# Patient Record
Sex: Male | Born: 1965 | Race: White | Hispanic: Yes | Marital: Married | Smoking: Former smoker
Health system: Southern US, Community
[De-identification: ages and names within clinical notes are randomized; demographics above are authoritative.]

## PROBLEM LIST (undated history)

## (undated) DIAGNOSIS — I639 Cerebral infarction, unspecified: Secondary | ICD-10-CM

## (undated) DIAGNOSIS — C4490 Unspecified malignant neoplasm of skin, unspecified: Secondary | ICD-10-CM

## (undated) DIAGNOSIS — I10 Essential (primary) hypertension: Secondary | ICD-10-CM

## (undated) HISTORY — PX: SKIN SURGERY: SHX2413

---

## 2004-03-11 ENCOUNTER — Ambulatory Visit: Payer: Self-pay | Admitting: Infectious Diseases

## 2004-03-11 ENCOUNTER — Inpatient Hospital Stay (HOSPITAL_COMMUNITY): Admission: EM | Admit: 2004-03-11 | Discharge: 2004-03-13 | Payer: Self-pay | Admitting: Emergency Medicine

## 2004-03-16 ENCOUNTER — Emergency Department (HOSPITAL_COMMUNITY): Admission: EM | Admit: 2004-03-16 | Discharge: 2004-03-16 | Payer: Self-pay | Admitting: Emergency Medicine

## 2006-02-08 ENCOUNTER — Ambulatory Visit (HOSPITAL_COMMUNITY): Admission: RE | Admit: 2006-02-08 | Discharge: 2006-02-08 | Payer: Self-pay | Admitting: Chiropractic Medicine

## 2013-03-17 ENCOUNTER — Encounter (HOSPITAL_COMMUNITY): Payer: Self-pay | Admitting: Emergency Medicine

## 2013-03-17 ENCOUNTER — Emergency Department (HOSPITAL_COMMUNITY)
Admission: EM | Admit: 2013-03-17 | Discharge: 2013-03-17 | Disposition: A | Discharge status: Home / Self Care | Payer: Self-pay | Attending: Emergency Medicine | Admitting: Emergency Medicine

## 2013-03-17 DIAGNOSIS — K089 Disorder of teeth and supporting structures, unspecified: Secondary | ICD-10-CM | POA: Insufficient documentation

## 2013-03-17 DIAGNOSIS — F17200 Nicotine dependence, unspecified, uncomplicated: Secondary | ICD-10-CM | POA: Insufficient documentation

## 2013-03-17 DIAGNOSIS — K029 Dental caries, unspecified: Secondary | ICD-10-CM | POA: Insufficient documentation

## 2013-03-17 DIAGNOSIS — K0889 Other specified disorders of teeth and supporting structures: Secondary | ICD-10-CM

## 2013-03-17 DIAGNOSIS — K006 Disturbances in tooth eruption: Secondary | ICD-10-CM | POA: Insufficient documentation

## 2013-03-17 MED ORDER — AMOXICILLIN 500 MG PO CAPS: 500.0000 mg | ORAL_CAPSULE | Freq: Three times a day (TID) | ORAL | Status: DC

## 2013-03-17 MED ORDER — OXYCODONE-ACETAMINOPHEN 5-325 MG PO TABS: 1.0000 | ORAL_TABLET | ORAL | Status: DC | PRN

## 2013-03-17 NOTE — ED Provider Notes (Signed)
CSN: [id number]     Arrival date & time 03/17/13  2141 History  This chart was scribed for Margarita Mail, PA-C, working with Elmer Sow, MD by Maree Erie, ED Scribe. This patient was seen in room TR07C/TR07C and the patient's care was started at 11:12 PM.     Chief Complaint  Patient presents with  . Dental Pain    Patient is a 48 y.o. male presenting with tooth pain. The history is provided by the patient. No language interpreter was used.  Dental Pain   HPI Comments: James Cortez is a 48 y.o. male who presents to the Emergency Department complaining of constant left upper dental pain that began four days ago. He reports associated gum swelling. The pain is worsened by cold air. The pain is moderately alleviated by Advil. He denies fever. He is a current smoker.   History reviewed. No pertinent past medical history. History reviewed. No pertinent past surgical history. History reviewed. No pertinent family history. History  Substance Use Topics  . Smoking status: Current Every Day Smoker  . Smokeless tobacco: Not on file  . Alcohol Use: Yes     Comment: rare    Review of Systems  Allergies  Review of patient's allergies indicates no known allergies.  Home Medications   Current Outpatient Rx  Name  Route  Sig  Dispense  Refill  . ibuprofen (ADVIL,MOTRIN) 200 MG tablet   Oral   Take 200 mg by mouth every 6 (six) hours as needed for mild pain.          Triage Vitals: BP 138/80  Pulse 64  Temp(Src) 97.5 F (36.4 C) (Oral)  Resp 12  Ht 5\' 7"  (1.702 m)  Wt 166 lb (75.297 kg)  BMI 25.99 kg/m2  SpO2 95%  Physical Exam  Nursing note and vitals reviewed. Constitutional: He is oriented to person, place, and time. He appears well-developed and well-nourished. No distress.  HENT:  Head: Normocephalic and atraumatic.  Mouth/Throat: Abnormal dentition. Dental caries present.  Multiple caries. Gingival erythema without discharge or fluctuance.   Eyes: EOM are normal.   Neck: Neck supple. No tracheal deviation present.  Cardiovascular: Normal rate.   Pulmonary/Chest: Effort normal. No respiratory distress.  Musculoskeletal: Normal range of motion.  Neurological: He is alert and oriented to person, place, and time.  Skin: Skin is warm and dry.  Psychiatric: He has a normal mood and affect. His behavior is normal.    ED Course  Procedures (including critical care time)  DIAGNOSTIC STUDIES: Oxygen Saturation is 95% on room air, adequate by my interpretation.    COORDINATION OF CARE: 11:15 PM -Will discharge with prescription for pain medication and antibiotic. Patient verbalizes understanding and agrees with treatment plan.    Labs Review Labs Reviewed - No data to display Imaging Review No results found.  EKG Interpretation   None       MDM   1. Pain, dental    Patient with toothache.  No gross abscess.  Exam unconcerning for Ludwig's angina or spread of infection.  Will treat with penicillin and pain medicine.  Urged patient to follow-up with dentist.     {I personally performed the services described in this documentation, which was scribed in my presence. The recorded information has been reviewed and is accurate.     Margarita Mail, PA-C 03/18/13 1354

## 2013-03-17 NOTE — ED Notes (Signed)
C/o L upper molar pain, onset 4d ago, h/o same 3 weeks ago, took advil with some relief 2 hrs ago. Mentions swelling. (Denies: nv, dizziness, fever, drainage or other sx).

## 2013-03-17 NOTE — Discharge Instructions (Signed)
You have been diagnosed with Dental pain. Please call the follow up dentist first thing in the morning on Monday for a follow up appointment. Keep your discharge paperwork from today's visit to bring to the dentist office. You may also use the resource guide listed below to help you find a dentist if you do not already have one to followup with. It is very important that you get evaluated by a dentist as soon as possible.  Use your pain medication as prescribed and do not operate heavy machinery while on pain medication. Note that your pain medication contains acetaminophen (Tylenol) & its is not reccommended that you use additional acetaminophen (Tylenol) while taking this medication. Take your full course of antibiotics. Read the instructions below. ° °Eat a soft or liquid diet and rinse your mouth out after meals with warm water. You should see a dentist or return here at once if you have increased swelling, increased pain or uncontrolled bleeding from the site of your injury. ° ° °SEEK MEDICAL CARE IF:  °· You have increased pain not controlled with medicines.  °· You have swelling around your tooth, in your face or neck.  °· You have bleeding which starts, continues, or gets worse.  °· You have a fever >101 °· If you are unable to open your mouth °Soft Diet  °The soft diet may be recommended after you were put on a full liquid diet. A normal diet may follow. The soft diet can also be used after surgery if you are too ill to keep down a normal diet. The soft diet may also be needed if you have a hard time chewing foods.  °DESCRIPTION  °Tender foods are used. Foods do not need to be ground or pureed. Most raw fruits and vegetables and coarse breads and cereals should be avoided. Fried foods and highly seasoned foods may cause discomfort.  °NUTRITIONAL ADEQUACY  °A healthy diet is possible if foods from each of the basic food groups are eaten daily.  °SOFT DIET FOOD LISTS  °Milk/Dairy  °Allowed: Milk and milk  drinks, milk shakes, cream cheese, cottage cheese, mild cheeses.  °Avoid: Sharp or highly seasoned cheese. °Meat/Meat Substitutes  °Allowed: Broiled, roasted, baked, or stewed tender lean beef, mutton, lamb, veal, chicken, turkey, liver, ham, crisp bacon, white fish, tuna, salmon. Eggs, smooth peanut butter.  °Avoid: All fried meats, fish, or fowl. Rich gravies and sauces. Lunch meats, sausages, hot dogs. Meats with gristle, chunky peanut butter. °Breads/Grains  °Allowed: Rice, noodles, spaghetti, macaroni. Dry or cooked refined cereals, such as farina, cream of wheat, oatmeal, grits, whole-wheat cereals. Plain or toasted white or wheat blend or whole-grain breads, soda crackers or saltines, flour tortillas.  °Avoid: Wild rice, coarse cereals, such as bran. Seed in or on breads and crackers. Bread or bread products with nuts or seeds. °Fruits/Vegetables  °Allowed: Fruit and vegetable juices, well-cooked or canned fruits and vegetables, any dried fruit. One citrus fruit daily, 1 vitamin A source daily. Well-ripened, easy to chew fruits, sweet potatoes. Baked, boiled, mashed, creamed, scalloped, or au gratin potatoes. Broths or creamed soups made with allowed vegetables, strained tomatoes.  °Avoid: All gas-forming vegetables (corn, radishes, Brussels sprouts, onions, broccoli, cabbage, parsnips, turnips, chili peppers, pinto beans, split peas, dried beans). Fruits containing seeds and skin. Potato chips and corn chips. All others that are not made with allowed vegetables. Highly seasoned soups. °Desserts/Sweets  °Allowed: Simple desserts, such as custard, junkets, gelatin desserts, plain ice cream and sherbets, simple cakes   and cookies, allowed fruits, sugar, syrup, jelly, honey, plain hard candy, and molasses.  °Avoid: Rich pastries, any dessert containing dates, nuts, raisins, or coconut. Fried pastries, such as doughnuts. Chocolate. °Beverages  °Allowed: Fruit and vegetable juices. Caffeine-free carbonated drinks,  coffee, and tea.  °Avoid: Caffeinated beverages: coffee, tea, soda or pop. °Miscellaneous  °Allowed: Butter, cream, margarine, mayonnaise, oil. Cream sauces, salt, and mild spices.  °Avoid: Highly spiced salad dressings. Highly seasoned foods, hot sauce, mustard, horseradish, and pepper. °SAMPLE MENU  °Breakfast  °Orange juice.  °Oatmeal.  °Soft cooked egg.  °Toast and margarine.  °2% milk.  °Coffee. °Lunch  °Meatloaf.  °Mashed potato.  °Green beans.  °Lemon pudding.  °Bread and margarine.  °Coffee. °Dinner  °Consommé or apricot nectar.  °Chicken breast.  °Rice, peas, and carrots.  °Applesauce.  °Bread and margarine.  °2% milk. °To cut the amount of fat in your diet, omit margarine and use 1% or skim milk.  °NUTRIENT ANALYSIS  °Calories........................1953 Kcal.  °Protein.........................102 gm.  °Carbohydrate...............247 gm.  °Fat................................65 gm.  °Cholesterol...................449 mg.  °Dietary fiber.................19 gm.  °Vitamin A.....................2944 RE.  °Vitamin C.....................79 mg.  °Niacin..........................25 mg.  °Riboflavin....................2.0 mg.  °Thiamin.......................1.5 mg.  °Folate..........................249 mcg.  °Calcium.......................1030 mg.  °Phosphorus.................1782 mg.  °Zinc..............................12 mg.  °Iron..............................13 mg.  °Sodium.........................299 mg.  °Potassium....................3046 mg. °Document Released: 05/03/2007 Document Revised: 04/18/2011 Document Reviewed: 05/03/2007  °ExitCare® Patient Information ©2014 [employer].  ° °RESOURCE GUIDE ° ° °Dental Problems ° °Dr. Janna Civilis °$200 dollar visit °[address] °[place], [place] [address]  °[phone] °  ° °Patients with Medicaid: °[facility]                     [facility] °[address]                                           [address] °Phone:   [phone]                                                  Phone:  [phone] ° °If unable to pay or uninsured, contact:  [facility] or [facility] to become qualified for the adult dental clinic. ° °Chronic Pain Problems °Contact [facility]  [phone] °Patients need to be referred by their primary care doctor. ° °Insufficient Money for Medicine °Contact [employer]:  call "211" or [facility] 271-5999. ° °No Primary Care Doctor °Call [facility]  832-8000 °Other agencies that provide inexpensive medical care °   [facility]  [phone] °   [facility]  [phone] °   [facility]  [phone] °   [facility]  [phone] °   [facility]  [phone] °   [facility]  [phone] ° °Psychological Services °[facility]  [phone] °[facility]  [phone] °[facility]   [phone] (emergency services [phone]) ° °Substance Abuse Resources °[facility]  336-882-2125 °[facility] 336-784-9470 °[facility] 336-285-9073 °[facility] 336-845-3988 °Residential & Outpatient Substance Abuse Program  800-659-3381 ° °Abuse/Neglect °[place] [facility] Hotline (336) 641-3795 °[place] [facility] Abuse Hotline 800-378-5315 (After Hours) ° °Emergency Shelter °[facility] (336) 271-5985 ° °Maternity Homes °[facility] (336) 275-9566 °[facility] (704) 372-4663 ° °MRSA Hotline #:   832-7006 ° ° ° °[place] [facility] ° °[facility]     [employer]                          [facility] °[address] [place]                       [address]      [address]  °[place]                                                [place]                            [place] °Phone:  349-3220                                   Phone:  [phone]                 Phone:   342-8140 ° °[facility] °Phone:  342-8316 ° °[place] Child Abuse Hotline °(336) 342-1394 °(336) 342-3537 (After Hours) ° ° ° ° ° ° °

## 2013-03-17 NOTE — ED Notes (Signed)
PA at bedside.

## 2013-03-18 NOTE — ED Provider Notes (Signed)
Medical screening examination/treatment/procedure(s) were performed by non-physician practitioner and as supervising physician I was immediately available for consultation/collaboration.  EKG Interpretation   None         Gatlin Kittell, MD 03/18/13 1508 

## 2014-09-01 ENCOUNTER — Encounter (HOSPITAL_COMMUNITY): Payer: Self-pay | Admitting: *Deleted

## 2014-09-01 ENCOUNTER — Emergency Department (HOSPITAL_COMMUNITY)
Admission: EM | Admit: 2014-09-01 | Discharge: 2014-09-01 | Disposition: A | Discharge status: Home / Self Care | Payer: Self-pay | Attending: Emergency Medicine | Admitting: Emergency Medicine

## 2014-09-01 DIAGNOSIS — Y998 Other external cause status: Secondary | ICD-10-CM | POA: Insufficient documentation

## 2014-09-01 DIAGNOSIS — Y9289 Other specified places as the place of occurrence of the external cause: Secondary | ICD-10-CM | POA: Insufficient documentation

## 2014-09-01 DIAGNOSIS — Y9389 Activity, other specified: Secondary | ICD-10-CM | POA: Insufficient documentation

## 2014-09-01 DIAGNOSIS — Z720 Tobacco use: Secondary | ICD-10-CM | POA: Insufficient documentation

## 2014-09-01 DIAGNOSIS — S61402A Unspecified open wound of left hand, initial encounter: Secondary | ICD-10-CM | POA: Insufficient documentation

## 2014-09-01 DIAGNOSIS — W228XXA Striking against or struck by other objects, initial encounter: Secondary | ICD-10-CM | POA: Insufficient documentation

## 2014-09-01 DIAGNOSIS — Z792 Long term (current) use of antibiotics: Secondary | ICD-10-CM | POA: Insufficient documentation

## 2014-09-01 MED ORDER — SULFAMETHOXAZOLE-TRIMETHOPRIM 800-160 MG PO TABS: 1.0000 | ORAL_TABLET | Freq: Two times a day (BID) | ORAL | Status: AC

## 2014-09-01 NOTE — ED Provider Notes (Signed)
CSN: [id number]     Arrival date & time 09/01/14  2012 History   This chart was scribed for non-physician practitioner, Debroah Baller NP-C, working with Pamella Pert, MD, by Chester Holstein, ED Scribe. This patient was seen in room TR10C/TR10C and the patient's care was started at 8:43 PM.       Chief Complaint  Patient presents with  . Abscess    The history is provided by the patient. No language interpreter was used.   HPI Comments: James Cortez is a 49 y.o. male who presents to the Emergency Department complaining of raised lesion with scabbing just proximal of 2nd MCP joint on left hand with gradual onset 1 month ago. He reports he hit the lesion on something today which caused increased associated pain. He denies discharge, fever, and chills. He has been using medication to dissolve warts on the area.    History reviewed. No pertinent past medical history. History reviewed. No pertinent past surgical history. History reviewed. No pertinent family history. History  Substance Use Topics  . Smoking status: Current Every Day Smoker  . Smokeless tobacco: Not on file  . Alcohol Use: Yes     Comment: rare    Review of Systems  Constitutional: Negative for fever and chills.  Skin: Positive for wound (open lesion and scabbing).       lesion  All other systems reviewed and are negative.     Allergies  Review of patient's allergies indicates no known allergies.  Home Medications   Prior to Admission medications   Medication Sig Start Date End Date Taking? Authorizing Provider  amoxicillin (AMOXIL) 500 MG capsule Take 1 capsule (500 mg total) by mouth 3 (three) times daily. 03/17/13   Margarita Mail, PA-C  ibuprofen (ADVIL,MOTRIN) 200 MG tablet Take 200 mg by mouth every 6 (six) hours as needed for mild pain.    Historical Provider, MD  oxyCODONE-acetaminophen (PERCOCET) 5-325 MG per tablet Take 1-2 tablets by mouth every 4 (four) hours as needed. 03/17/13   Margarita Mail, PA-C   sulfamethoxazole-trimethoprim (BACTRIM DS,SEPTRA DS) 800-160 MG per tablet Take 1 tablet by mouth 2 (two) times daily. 09/01/14 09/08/14  Hope Bunnie Pion, NP   BP 136/61 mmHg  Pulse 72  Temp(Src) 98 F (36.7 C) (Oral)  Resp 20  SpO2 100% Physical Exam  Constitutional: He is oriented to person, place, and time. He appears well-developed and well-nourished. No distress.  HENT:  Head: Normocephalic and atraumatic.  Eyes: EOM are normal.  Neck: Normal range of motion.  Cardiovascular: Normal rate.   Pulmonary/Chest: Effort normal. No respiratory distress.  Abdominal: Soft.  Musculoskeletal: Normal range of motion.       Hands: Raised, firm area to the dorsum of the left hand with scab to the top. During exam scab came off. Underneath there is an open wound with grainy texture. Tender on exam.   Neurological: He is alert and oriented to person, place, and time.  Skin: Skin is warm and dry.  Psychiatric: He has a normal mood and affect.  Nursing note and vitals reviewed.   ED Course  Procedures (including critical care time)  Dr. Aline Brochure in to examine the patient and discussed possible wart vs skin cancer and need for follow up. Patient have difficulty with money and discussed f/u with [facility] and Wellness and patient agrees.   DIAGNOSTIC STUDIES: Oxygen Saturation is 100% on room air, normal by my interpretation.    COORDINATION OF CARE: 8:45 PM Discussed treatment  plan with patient at beside, the patient agrees with the plan and has no further questions at this time.    MDM  49 y.o. male with wound to the left hand x one month that has gotten worse. Stable for d/c without fever or other problems. He will f/u with [facility] and Wellness.   Final diagnoses:  Open wound of left hand, initial encounter    I personally performed the services described in this documentation, which was scribed in my presence. The recorded information has been reviewed and is accurate.      [address] [place], [place] 09/01/14 2110  Pamella Pert, MD 09/02/14 [phone]

## 2014-09-01 NOTE — ED Notes (Signed)
Pt in with possible abscess to his left hand, states it has been there for a month and will not go away

## 2014-09-01 NOTE — Discharge Instructions (Signed)
Dr. Aline Brochure is unsure if the area on your hand is a wart or skin cancer. He wants you to follow up with [facility] and wellness and they will get you in with a skin doctor if needed. We are starting you on antibiotics tonight in case there is any infection in the wound.

## 2015-01-09 ENCOUNTER — Emergency Department (HOSPITAL_COMMUNITY)
Admission: EM | Admit: 2015-01-09 | Discharge: 2015-01-09 | Disposition: A | Discharge status: Home / Self Care | Payer: No Typology Code available for payment source | Attending: Emergency Medicine | Admitting: Emergency Medicine

## 2015-01-09 ENCOUNTER — Emergency Department (HOSPITAL_COMMUNITY): Payer: No Typology Code available for payment source

## 2015-01-09 ENCOUNTER — Encounter (HOSPITAL_COMMUNITY): Payer: Self-pay

## 2015-01-09 DIAGNOSIS — S3991XA Unspecified injury of abdomen, initial encounter: Secondary | ICD-10-CM | POA: Diagnosis not present

## 2015-01-09 DIAGNOSIS — F17210 Nicotine dependence, cigarettes, uncomplicated: Secondary | ICD-10-CM | POA: Insufficient documentation

## 2015-01-09 DIAGNOSIS — Y92410 Unspecified street and highway as the place of occurrence of the external cause: Secondary | ICD-10-CM | POA: Diagnosis not present

## 2015-01-09 DIAGNOSIS — Z85828 Personal history of other malignant neoplasm of skin: Secondary | ICD-10-CM | POA: Diagnosis not present

## 2015-01-09 DIAGNOSIS — S199XXA Unspecified injury of neck, initial encounter: Secondary | ICD-10-CM | POA: Insufficient documentation

## 2015-01-09 DIAGNOSIS — G8929 Other chronic pain: Secondary | ICD-10-CM | POA: Diagnosis not present

## 2015-01-09 DIAGNOSIS — Z792 Long term (current) use of antibiotics: Secondary | ICD-10-CM | POA: Diagnosis not present

## 2015-01-09 DIAGNOSIS — Y9389 Activity, other specified: Secondary | ICD-10-CM | POA: Insufficient documentation

## 2015-01-09 DIAGNOSIS — Y998 Other external cause status: Secondary | ICD-10-CM | POA: Diagnosis not present

## 2015-01-09 DIAGNOSIS — S6992XA Unspecified injury of left wrist, hand and finger(s), initial encounter: Secondary | ICD-10-CM | POA: Diagnosis not present

## 2015-01-09 HISTORY — DX: Unspecified malignant neoplasm of skin, unspecified: C44.90

## 2015-01-09 MED ORDER — ACETAMINOPHEN 325 MG PO TABS
975.0000 mg | ORAL_TABLET | Freq: Once | ORAL | Status: AC
  Administered 2015-01-09: 975 mg via ORAL
  Filled 2015-01-09: qty 3
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
## 2015-01-09 NOTE — ED Notes (Signed)
Patient transported to X-ray 
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
## 2015-01-14 ENCOUNTER — Emergency Department (HOSPITAL_COMMUNITY)
Admission: EM | Admit: 2015-01-14 | Discharge: 2015-01-14 | Disposition: A | Discharge status: Home / Self Care | Payer: No Typology Code available for payment source | Attending: Emergency Medicine | Admitting: Emergency Medicine

## 2015-01-14 ENCOUNTER — Encounter (HOSPITAL_COMMUNITY): Payer: Self-pay | Admitting: *Deleted

## 2015-01-14 ENCOUNTER — Emergency Department (HOSPITAL_COMMUNITY): Payer: No Typology Code available for payment source

## 2015-01-14 DIAGNOSIS — M542 Cervicalgia: Secondary | ICD-10-CM

## 2015-01-14 DIAGNOSIS — Y9389 Activity, other specified: Secondary | ICD-10-CM | POA: Diagnosis not present

## 2015-01-14 DIAGNOSIS — S0990XA Unspecified injury of head, initial encounter: Secondary | ICD-10-CM | POA: Diagnosis not present

## 2015-01-14 DIAGNOSIS — Y998 Other external cause status: Secondary | ICD-10-CM | POA: Insufficient documentation

## 2015-01-14 DIAGNOSIS — F17210 Nicotine dependence, cigarettes, uncomplicated: Secondary | ICD-10-CM | POA: Insufficient documentation

## 2015-01-14 DIAGNOSIS — Z85828 Personal history of other malignant neoplasm of skin: Secondary | ICD-10-CM | POA: Diagnosis not present

## 2015-01-14 DIAGNOSIS — Y92410 Unspecified street and highway as the place of occurrence of the external cause: Secondary | ICD-10-CM | POA: Diagnosis not present

## 2015-01-14 DIAGNOSIS — S3992XA Unspecified injury of lower back, initial encounter: Secondary | ICD-10-CM | POA: Diagnosis present

## 2015-01-14 DIAGNOSIS — S199XXA Unspecified injury of neck, initial encounter: Secondary | ICD-10-CM | POA: Diagnosis not present

## 2015-01-14 DIAGNOSIS — M549 Dorsalgia, unspecified: Secondary | ICD-10-CM

## 2015-01-14 MED ORDER — CYCLOBENZAPRINE HCL 10 MG PO TABS: 10.0000 mg | ORAL_TABLET | Freq: Two times a day (BID) | ORAL | Status: DC | PRN

## 2015-01-14 MED ORDER — ACETAMINOPHEN 325 MG PO TABS
650.0000 mg | ORAL_TABLET | Freq: Once | ORAL | Status: AC
  Administered 2015-01-14: 650 mg via ORAL
  Filled 2015-01-14: qty 2

## 2015-01-14 MED ORDER — IBUPROFEN 600 MG PO TABS: 600.0000 mg | ORAL_TABLET | Freq: Four times a day (QID) | ORAL | Status: DC | PRN
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
## 2015-12-08 ENCOUNTER — Emergency Department (HOSPITAL_COMMUNITY): Payer: Self-pay | Admitting: Anesthesiology

## 2015-12-08 ENCOUNTER — Inpatient Hospital Stay (HOSPITAL_COMMUNITY): Payer: Self-pay

## 2015-12-08 ENCOUNTER — Encounter (HOSPITAL_COMMUNITY)
Admission: EM | Disposition: A | Discharge status: Still In Hospital | Payer: Self-pay | Source: Home / Self Care | Attending: Neurology

## 2015-12-08 ENCOUNTER — Encounter (HOSPITAL_COMMUNITY): Payer: Self-pay

## 2015-12-08 ENCOUNTER — Inpatient Hospital Stay (HOSPITAL_COMMUNITY)
Admission: EM | Admit: 2015-12-08 | Discharge: 2015-12-15 | DRG: 023 | Disposition: A | Discharge status: Rehabilitation Facility | Payer: Self-pay | Attending: Neurology | Admitting: Neurology

## 2015-12-08 ENCOUNTER — Emergency Department (HOSPITAL_COMMUNITY): Payer: Self-pay

## 2015-12-08 DIAGNOSIS — R29714 NIHSS score 14: Secondary | ICD-10-CM | POA: Diagnosis present

## 2015-12-08 DIAGNOSIS — F17210 Nicotine dependence, cigarettes, uncomplicated: Secondary | ICD-10-CM | POA: Diagnosis present

## 2015-12-08 DIAGNOSIS — R29810 Facial weakness: Secondary | ICD-10-CM | POA: Diagnosis present

## 2015-12-08 DIAGNOSIS — Z4659 Encounter for fitting and adjustment of other gastrointestinal appliance and device: Secondary | ICD-10-CM

## 2015-12-08 DIAGNOSIS — I63412 Cerebral infarction due to embolism of left middle cerebral artery: Principal | ICD-10-CM | POA: Diagnosis present

## 2015-12-08 DIAGNOSIS — I10 Essential (primary) hypertension: Secondary | ICD-10-CM

## 2015-12-08 DIAGNOSIS — G8191 Hemiplegia, unspecified affecting right dominant side: Secondary | ICD-10-CM

## 2015-12-08 DIAGNOSIS — Z01818 Encounter for other preprocedural examination: Secondary | ICD-10-CM

## 2015-12-08 DIAGNOSIS — E785 Hyperlipidemia, unspecified: Secondary | ICD-10-CM | POA: Diagnosis present

## 2015-12-08 DIAGNOSIS — I63312 Cerebral infarction due to thrombosis of left middle cerebral artery: Secondary | ICD-10-CM

## 2015-12-08 DIAGNOSIS — J9601 Acute respiratory failure with hypoxia: Secondary | ICD-10-CM

## 2015-12-08 DIAGNOSIS — E876 Hypokalemia: Secondary | ICD-10-CM | POA: Diagnosis present

## 2015-12-08 DIAGNOSIS — Q251 Coarctation of aorta: Secondary | ICD-10-CM

## 2015-12-08 DIAGNOSIS — J9600 Acute respiratory failure, unspecified whether with hypoxia or hypercapnia: Secondary | ICD-10-CM | POA: Diagnosis not present

## 2015-12-08 DIAGNOSIS — I639 Cerebral infarction, unspecified: Secondary | ICD-10-CM | POA: Diagnosis present

## 2015-12-08 DIAGNOSIS — J9620 Acute and chronic respiratory failure, unspecified whether with hypoxia or hypercapnia: Secondary | ICD-10-CM

## 2015-12-08 DIAGNOSIS — Z0189 Encounter for other specified special examinations: Secondary | ICD-10-CM

## 2015-12-08 DIAGNOSIS — R1312 Dysphagia, oropharyngeal phase: Secondary | ICD-10-CM

## 2015-12-08 DIAGNOSIS — D72829 Elevated white blood cell count, unspecified: Secondary | ICD-10-CM | POA: Diagnosis not present

## 2015-12-08 DIAGNOSIS — Z9289 Personal history of other medical treatment: Secondary | ICD-10-CM

## 2015-12-08 DIAGNOSIS — R4701 Aphasia: Secondary | ICD-10-CM | POA: Diagnosis present

## 2015-12-08 DIAGNOSIS — Z85828 Personal history of other malignant neoplasm of skin: Secondary | ICD-10-CM

## 2015-12-08 DIAGNOSIS — E878 Other disorders of electrolyte and fluid balance, not elsewhere classified: Secondary | ICD-10-CM | POA: Diagnosis present

## 2015-12-08 HISTORY — PX: RADIOLOGY WITH ANESTHESIA: SHX6223

## 2015-12-08 HISTORY — PX: IR GENERIC HISTORICAL: IMG1180011

## 2015-12-08 LAB — CBC
HCT: 51.2 % (ref 39.0–52.0)
Hemoglobin: 18.2 g/dL — ABNORMAL HIGH (ref 13.0–17.0)
MCH: 31.5 pg (ref 26.0–34.0)
MCHC: 35.5 g/dL (ref 30.0–36.0)
MCV: 88.7 fL (ref 78.0–100.0)
PLATELETS: 264 10*3/uL (ref 150–400)
RBC: 5.77 MIL/uL (ref 4.22–5.81)
RDW: 12.9 % (ref 11.5–15.5)
WBC: 15.4 10*3/uL — AB (ref 4.0–10.5)

## 2015-12-08 LAB — I-STAT CHEM 8, ED
BUN: 14 mg/dL (ref 6–20)
CHLORIDE: 104 mmol/L (ref 101–111)
Calcium, Ion: 1.05 mmol/L — ABNORMAL LOW (ref 1.15–1.40)
Creatinine, Ser: 0.7 mg/dL (ref 0.61–1.24)
Glucose, Bld: 106 mg/dL — ABNORMAL HIGH (ref 65–99)
HEMATOCRIT: 53 % — AB (ref 39.0–52.0)
Hemoglobin: 18 g/dL — ABNORMAL HIGH (ref 13.0–17.0)
POTASSIUM: 3.8 mmol/L (ref 3.5–5.1)
SODIUM: 140 mmol/L (ref 135–145)
TCO2: 23 mmol/L (ref 0–100)

## 2015-12-08 LAB — DIFFERENTIAL
Basophils Absolute: 0.1 10*3/uL (ref 0.0–0.1)
Basophils Relative: 1 %
EOS PCT: 1 %
Eosinophils Absolute: 0.2 10*3/uL (ref 0.0–0.7)
LYMPHS PCT: 33 %
Lymphs Abs: 5 10*3/uL — ABNORMAL HIGH (ref 0.7–4.0)
Monocytes Absolute: 1 10*3/uL (ref 0.1–1.0)
Monocytes Relative: 7 %
NEUTROS PCT: 58 %
Neutro Abs: 9.1 10*3/uL — ABNORMAL HIGH (ref 1.7–7.7)

## 2015-12-08 LAB — PROTIME-INR
INR: 0.99
PROTHROMBIN TIME: 13.1 s (ref 11.4–15.2)

## 2015-12-08 LAB — URINALYSIS, ROUTINE W REFLEX MICROSCOPIC
Bilirubin Urine: NEGATIVE
GLUCOSE, UA: NEGATIVE mg/dL
Hgb urine dipstick: NEGATIVE
Ketones, ur: NEGATIVE mg/dL
LEUKOCYTES UA: NEGATIVE
Nitrite: NEGATIVE
PH: 8.5 — AB (ref 5.0–8.0)
Protein, ur: NEGATIVE mg/dL
Specific Gravity, Urine: 1.022 (ref 1.005–1.030)

## 2015-12-08 LAB — COMPREHENSIVE METABOLIC PANEL
ALBUMIN: 4.4 g/dL (ref 3.5–5.0)
ALK PHOS: 71 U/L (ref 38–126)
ALT: 34 U/L (ref 17–63)
ANION GAP: 10 (ref 5–15)
AST: 27 U/L (ref 15–41)
BUN: 13 mg/dL (ref 6–20)
CALCIUM: 9.3 mg/dL (ref 8.9–10.3)
CHLORIDE: 106 mmol/L (ref 101–111)
CO2: 22 mmol/L (ref 22–32)
Creatinine, Ser: 0.9 mg/dL (ref 0.61–1.24)
GFR calc Af Amer: >60 mL/min (ref >60)
GFR calc non Af Amer: >60 mL/min (ref >60)
GLUCOSE: 108 mg/dL — AB (ref 65–99)
POTASSIUM: 3.9 mmol/L (ref 3.5–5.1)
SODIUM: 138 mmol/L (ref 135–145)
Total Bilirubin: 0.3 mg/dL (ref 0.3–1.2)
Total Protein: 7.1 g/dL (ref 6.5–8.1)

## 2015-12-08 LAB — I-STAT TROPONIN, ED: Troponin i, poc: 0 ng/mL (ref 0.00–0.08)

## 2015-12-08 LAB — CBG MONITORING, ED: Glucose-Capillary: 111 mg/dL — ABNORMAL HIGH (ref 65–99)

## 2015-12-08 LAB — APTT: aPTT: 29 seconds (ref 24–36)

## 2015-12-08 SURGERY — RADIOLOGY WITH ANESTHESIA
Anesthesia: Monitor Anesthesia Care | Laterality: Right

## 2015-12-08 MED ORDER — FENTANYL CITRATE (PF) 100 MCG/2ML IJ SOLN
INTRAMUSCULAR | Status: DC | PRN
  Administered 2015-12-08 (×2): 50 ug via INTRAVENOUS

## 2015-12-08 MED ORDER — ACETAMINOPHEN 325 MG PO TABS: 650.0000 mg | ORAL_TABLET | ORAL | Status: DC | PRN

## 2015-12-08 MED ORDER — ACETAMINOPHEN 500 MG PO TABS: 1000.0000 mg | ORAL_TABLET | Freq: Four times a day (QID) | ORAL | Status: DC | PRN

## 2015-12-08 MED ORDER — IOPAMIDOL (ISOVUE-300) INJECTION 61%
INTRAVENOUS | Status: AC
  Administered 2015-12-08: 150 mL via NASOGASTRIC
  Filled 2015-12-08: qty 150

## 2015-12-08 MED ORDER — SODIUM CHLORIDE 0.9 % IV SOLN
Freq: Once | INTRAVENOUS | Status: AC
  Administered 2015-12-08: 19:00:00 via INTRAVENOUS

## 2015-12-08 MED ORDER — LABETALOL HCL 5 MG/ML IV SOLN: 10.0000 mg | INTRAVENOUS | Status: DC | PRN

## 2015-12-08 MED ORDER — PROMETHAZINE HCL 25 MG/ML IJ SOLN: 6.2500 mg | INTRAMUSCULAR | Status: DC | PRN

## 2015-12-08 MED ORDER — ASPIRIN 325 MG PO TABS
325.0000 mg | ORAL_TABLET | Freq: Every day | ORAL | Status: DC
  Administered 2015-12-09: 325 mg via ORAL
  Filled 2015-12-08: qty 1

## 2015-12-08 MED ORDER — ALTEPLASE (STROKE) FULL DOSE INFUSION
0.9000 mg/kg | Freq: Once | INTRAVENOUS | Status: AC
  Administered 2015-12-08: 70 mg via INTRAVENOUS
  Filled 2015-12-08: qty 100

## 2015-12-08 MED ORDER — PROPOFOL 500 MG/50ML IV EMUL
INTRAVENOUS | Status: DC | PRN
  Administered 2015-12-08: 50 ug/kg/min via INTRAVENOUS

## 2015-12-08 MED ORDER — LIDOCAINE HCL (CARDIAC) 20 MG/ML IV SOLN
INTRAVENOUS | Status: DC | PRN
  Administered 2015-12-08: 100 mg via INTRATRACHEAL

## 2015-12-08 MED ORDER — MIDAZOLAM HCL 2 MG/2ML IJ SOLN
INTRAMUSCULAR | Status: DC | PRN
  Administered 2015-12-08: 2 mg via INTRAVENOUS

## 2015-12-08 MED ORDER — SODIUM CHLORIDE 0.9 % IV SOLN
INTRAVENOUS | Status: DC
  Administered 2015-12-09 – 2015-12-15 (×5): via INTRAVENOUS
  Administered 2015-12-15: 75 mL/h via INTRAVENOUS

## 2015-12-08 MED ORDER — FENTANYL CITRATE (PF) 100 MCG/2ML IJ SOLN: 25.0000 ug | INTRAMUSCULAR | Status: DC | PRN

## 2015-12-08 MED ORDER — PROPOFOL 10 MG/ML IV BOLUS
INTRAVENOUS | Status: DC | PRN
  Administered 2015-12-08: 30 mg via INTRAVENOUS
  Administered 2015-12-08: 150 mg via INTRAVENOUS

## 2015-12-08 MED ORDER — PHENYLEPHRINE HCL 10 MG/ML IJ SOLN
INTRAVENOUS | Status: DC | PRN
  Administered 2015-12-08: 50 ug/min via INTRAVENOUS

## 2015-12-08 MED ORDER — MEPERIDINE HCL 25 MG/ML IJ SOLN: 6.2500 mg | INTRAMUSCULAR | Status: DC | PRN

## 2015-12-08 MED ORDER — PROPOFOL 1000 MG/100ML IV EMUL
INTRAVENOUS | Status: AC
  Filled 2015-12-08: qty 100

## 2015-12-08 MED ORDER — SODIUM CHLORIDE 0.9 % IV SOLN
INTRAVENOUS | Status: DC | PRN
  Administered 2015-12-08 (×3): via INTRAVENOUS

## 2015-12-08 MED ORDER — LABETALOL HCL 5 MG/ML IV SOLN
INTRAVENOUS | Status: DC | PRN
  Administered 2015-12-08: 5 mg via INTRAVENOUS
  Administered 2015-12-08: 10 mg via INTRAVENOUS
  Administered 2015-12-08: 5 mg via INTRAVENOUS

## 2015-12-08 MED ORDER — FENTANYL CITRATE (PF) 100 MCG/2ML IJ SOLN
100.0000 ug | INTRAMUSCULAR | Status: DC | PRN
  Administered 2015-12-09 (×2): 100 ug via INTRAVENOUS
  Filled 2015-12-08 (×2): qty 2

## 2015-12-08 MED ORDER — ACETAMINOPHEN 650 MG RE SUPP: 650.0000 mg | RECTAL | Status: DC | PRN

## 2015-12-08 MED ORDER — FAMOTIDINE IN NACL 20-0.9 MG/50ML-% IV SOLN
20.0000 mg | Freq: Two times a day (BID) | INTRAVENOUS | Status: DC
  Administered 2015-12-09 (×2): 20 mg via INTRAVENOUS
  Filled 2015-12-08 (×2): qty 50

## 2015-12-08 MED ORDER — FENTANYL CITRATE (PF) 100 MCG/2ML IJ SOLN: 100.0000 ug | INTRAMUSCULAR | Status: DC | PRN

## 2015-12-08 MED ORDER — ORAL CARE MOUTH RINSE: 15.0000 mL | Freq: Four times a day (QID) | OROMUCOSAL | Status: DC

## 2015-12-08 MED ORDER — SODIUM CHLORIDE 0.9 % IJ SOLN
INTRAVENOUS | Status: AC | PRN
  Administered 2015-12-08 (×2): 25 ug via INTRA_ARTERIAL

## 2015-12-08 MED ORDER — CLOPIDOGREL BISULFATE 300 MG PO TABS
ORAL_TABLET | ORAL | Status: AC
  Filled 2015-12-08: qty 1

## 2015-12-08 MED ORDER — ASPIRIN 325 MG PO TABS
325.0000 mg | ORAL_TABLET | Freq: Once | ORAL | Status: AC
  Administered 2015-12-08: 325 mg via ORAL

## 2015-12-08 MED ORDER — SUCCINYLCHOLINE CHLORIDE 20 MG/ML IJ SOLN
INTRAMUSCULAR | Status: DC | PRN
  Administered 2015-12-08: 120 mg via INTRAVENOUS

## 2015-12-08 MED ORDER — ASPIRIN 325 MG PO TABS
ORAL_TABLET | ORAL | Status: AC
  Filled 2015-12-08: qty 1

## 2015-12-08 MED ORDER — FAMOTIDINE IN NACL 20-0.9 MG/50ML-% IV SOLN: 20.0000 mg | Freq: Two times a day (BID) | INTRAVENOUS | Status: DC

## 2015-12-08 MED ORDER — ALTEPLASE 30 MG/30 ML FOR INTERV. RAD
1.0000 mg | Freq: Once | INTRA_ARTERIAL | Status: AC
  Administered 2015-12-08: 3 mg via INTRA_ARTERIAL
  Administered 2015-12-08: 5 mg via INTRA_ARTERIAL
  Filled 2015-12-08: qty 30

## 2015-12-08 MED ORDER — LIDOCAINE HCL 1 % IJ SOLN
INTRAMUSCULAR | Status: AC | PRN
  Administered 2015-12-08: 10 mL

## 2015-12-08 MED ORDER — LABETALOL HCL 5 MG/ML IV SOLN: 20.0000 mg | Freq: Once | INTRAVENOUS | Status: DC

## 2015-12-08 MED ORDER — IOPAMIDOL (ISOVUE-300) INJECTION 61%
INTRAVENOUS | Status: AC
  Administered 2015-12-08: 90 mL
  Filled 2015-12-08: qty 150

## 2015-12-08 MED ORDER — NITROGLYCERIN 1 MG/10 ML FOR IR/CATH LAB
INTRA_ARTERIAL | Status: AC
  Filled 2015-12-08: qty 10

## 2015-12-08 MED ORDER — CLOPIDOGREL BISULFATE 75 MG PO TABS
75.0000 mg | ORAL_TABLET | Freq: Every day | ORAL | Status: DC
  Administered 2015-12-09 – 2015-12-15 (×5): 75 mg via ORAL
  Filled 2015-12-08 (×5): qty 1

## 2015-12-08 MED ORDER — ACETAMINOPHEN 650 MG RE SUPP: 650.0000 mg | Freq: Four times a day (QID) | RECTAL | Status: DC | PRN

## 2015-12-08 MED ORDER — ROCURONIUM BROMIDE 100 MG/10ML IV SOLN
INTRAVENOUS | Status: DC | PRN
  Administered 2015-12-08 (×3): 50 mg via INTRAVENOUS
  Administered 2015-12-08: 20 mg via INTRAVENOUS
  Administered 2015-12-08: 50 mg via INTRAVENOUS

## 2015-12-08 MED ORDER — SODIUM CHLORIDE 0.9 % IV SOLN: INTRAVENOUS | Status: DC

## 2015-12-08 MED ORDER — PROPOFOL 1000 MG/100ML IV EMUL
0.0000 ug/kg/min | INTRAVENOUS | Status: DC
  Administered 2015-12-09: 50 ug/kg/min via INTRAVENOUS
  Administered 2015-12-09: 40 ug/kg/min via INTRAVENOUS
  Administered 2015-12-09: 50 ug/kg/min via INTRAVENOUS
  Administered 2015-12-09: 45 ug/kg/min via INTRAVENOUS
  Administered 2015-12-09 – 2015-12-10 (×2): 40 ug/kg/min via INTRAVENOUS
  Filled 2015-12-08 (×7): qty 100

## 2015-12-08 MED ORDER — IOPAMIDOL (ISOVUE-300) INJECTION 61%
INTRAVENOUS | Status: AC
  Administered 2015-12-08: 20 mL
  Filled 2015-12-08: qty 150

## 2015-12-08 MED ORDER — ONDANSETRON HCL 4 MG/2ML IJ SOLN: 4.0000 mg | Freq: Four times a day (QID) | INTRAMUSCULAR | Status: DC | PRN

## 2015-12-08 MED ORDER — CLOPIDOGREL BISULFATE 300 MG PO TABS
300.0000 mg | ORAL_TABLET | Freq: Once | ORAL | Status: AC
  Administered 2015-12-08: 300 mg via ORAL

## 2015-12-08 MED ORDER — LIDOCAINE HCL 1 % IJ SOLN
INTRAMUSCULAR | Status: AC
  Filled 2015-12-08: qty 20

## 2015-12-08 MED ORDER — NICARDIPINE HCL IN NACL 20-0.86 MG/200ML-% IV SOLN: 5.0000 mg/h | INTRAVENOUS | Status: DC

## 2015-12-08 MED ORDER — STROKE: EARLY STAGES OF RECOVERY BOOK
Freq: Once | Status: AC
  Administered 2015-12-09: 02:00:00
  Filled 2015-12-08: qty 1

## 2015-12-08 MED ORDER — CHLORHEXIDINE GLUCONATE 0.12% ORAL RINSE (MEDLINE KIT)
15.0000 mL | Freq: Two times a day (BID) | OROMUCOSAL | Status: DC
  Administered 2015-12-09 – 2015-12-12 (×8): 15 mL via OROMUCOSAL

## 2015-12-08 MED ORDER — ALTEPLASE 30 MG/30 ML FOR INTERV. RAD
1.0000 mg | INTRA_ARTERIAL | Status: DC | PRN
  Filled 2015-12-08: qty 30

## 2015-12-08 MED ORDER — CLEVIDIPINE BUTYRATE 0.5 MG/ML IV EMUL: 0.0000 mg/h | INTRAVENOUS | Status: DC

## 2015-12-08 NOTE — ED Triage Notes (Signed)
Pt. Coming from a school via [facility] for code stroke. Pt. Last seen normal 1723. Pt. Had right-sided facial droop and right-sided hemiplegia. Pt. Non-verbal upon arrival. Stroke team at bedside.

## 2015-12-08 NOTE — Transfer of Care (Signed)
Immediate Anesthesia Transfer of Care Note  Patient: James Cortez  Procedure(s) Performed: Procedure(s): RADIOLOGY WITH ANESTHESIA - CODE STROKE (Right)  Patient Location: NICU  Anesthesia Type:General  Level of Consciousness: Patient remains intubated per anesthesia plan  Airway & Oxygen Therapy: Patient placed on Ventilator (see vital sign flow sheet for setting)  Post-op Assessment: Report given to RN and Post -op Vital signs reviewed and stable  Post vital signs: Reviewed and stable  Last Vitals:  Vitals:   12/08/15 1900 12/08/15 1915  BP: 130/75 135/77  Pulse: 71 73  Resp: 17 20  Temp:      Last Pain:  Vitals:   12/08/15 1915  TempSrc:   PainSc: 0-No pain         Complications: No apparent anesthesia complications

## 2015-12-08 NOTE — Anesthesia Preprocedure Evaluation (Addendum)
Anesthesia Evaluation  Patient identified by MRN, date of birth, ID band  Reviewed: Allergy & Precautions, Patient's Chart, lab work & pertinent test results, Unable to perform ROS - Chart review only  Airway Mallampati: II  TM Distance: >3 FB Neck ROM: Full    Dental no notable dental hx.    Pulmonary Current Smoker,    Pulmonary exam normal breath sounds clear to auscultation       Cardiovascular negative cardio ROS Normal cardiovascular exam Rhythm:Regular Rate:Normal     Neuro/Psych CVA negative psych ROS   GI/Hepatic negative GI ROS, Neg liver ROS,   Endo/Other  negative endocrine ROS  Renal/GU negative Renal ROS     Musculoskeletal negative musculoskeletal ROS (+)   Abdominal   Peds  Hematology negative hematology ROS (+)   Anesthesia Other Findings   Reproductive/Obstetrics                           Anesthesia Physical Anesthesia Plan  ASA: IV  Anesthesia Plan: MAC   Post-op Pain Management:    Induction: Intravenous  Airway Management Planned:   Additional Equipment:   Intra-op Plan:   Post-operative Plan:   Informed Consent: I have reviewed the patients History and Physical, chart, labs and discussed the procedure including the risks, benefits and alternatives for the proposed anesthesia with the patient or authorized representative who has indicated his/her understanding and acceptance.   Dental advisory given  Plan Discussed with: CRNA  Anesthesia Plan Comments: (Possible convert to general ETT with possible postop intubation/ventilation)        Anesthesia Quick Evaluation

## 2015-12-08 NOTE — ED Notes (Signed)
Pt to IR with ED RN and IR RN

## 2015-12-08 NOTE — Anesthesia Preprocedure Evaluation (Addendum)
Anesthesia Evaluation  Patient identified by MRN, date of birth, ID band Patient awake    Reviewed: Allergy & Precautions, NPO status , Patient's Chart, lab work & pertinent test results  Airway Mallampati: II       Dental  (+) Teeth Intact   Pulmonary Current Smoker,    Pulmonary exam normal        Cardiovascular  Rhythm:Regular Rate:Normal     Neuro/Psych    GI/Hepatic   Endo/Other    Renal/GU      Musculoskeletal   Abdominal   Peds  Hematology   Anesthesia Other Findings   Reproductive/Obstetrics                             Anesthesia Physical Anesthesia Plan  ASA: III and emergent  Anesthesia Plan: MAC   Post-op Pain Management:    Induction:   Airway Management Planned: Nasal Cannula  Additional Equipment:   Intra-op Plan:   Post-operative Plan:   Informed Consent:   Plan Discussed with: Anesthesiologist and Surgeon  Anesthesia Plan Comments:        Anesthesia Quick Evaluation

## 2015-12-08 NOTE — ED Notes (Signed)
CBG 111 

## 2015-12-08 NOTE — ED Provider Notes (Signed)
[place] DEPT Provider Note   CSN: [id number] Arrival date & time: 12/08/15  1747     History   Chief Complaint No chief complaint on file.   HPI James Cortez is a 50 y.o. male.  Patient is a 50 year old male with no significant past medical history who acutely at 5:15 this evening developed dense right sided facial weakness, upper and lower extremity weakness and aphasia. EMS was contacted at 520. Patient was a code stroke upon arrival here. Blood sugar was greater than 100. Patient is not able to give any history due to his condition.   The history is provided by the EMS personnel. The history is limited by the condition of the patient.    Past Medical History:  Diagnosis Date  . Skin cancer     There are no active problems to display for this patient.   Past Surgical History:  Procedure Laterality Date  . SKIN SURGERY         Home Medications    Prior to Admission medications   Medication Sig Start Date End Date Taking? Authorizing Provider  cyclobenzaprine (FLEXERIL) 10 MG tablet Take 1 tablet (10 mg total) by mouth 2 (two) times daily as needed for muscle spasms. 01/14/15   [name], PA-C  ibuprofen (ADVIL,MOTRIN) 600 MG tablet Take 1 tablet (600 mg total) by mouth every 6 (six) hours as needed. 01/14/15   [name], PA-C    Family History No family history on file.  Social History Social History  Substance Use Topics  . Smoking status: Current Every Day Smoker    Packs/day: 1.00    Types: Cigarettes  . Smokeless tobacco: Not on file  . Alcohol use Yes     Comment: rare     Allergies   Review of patient's allergies indicates no known allergies.   Review of Systems Review of Systems  Unable to perform ROS: Mental status change     Physical Exam Updated Vital Signs Wt 170 lb 13.7 oz (77.5 kg)   BMI 26.76 kg/m   Physical Exam  Constitutional: He appears well-developed and well-nourished. No distress.  HENT:    Head: Normocephalic and atraumatic.  Mouth/Throat: Oropharynx is clear and moist.  Eyes: Conjunctivae and EOM are normal. Pupils are equal, round, and reactive to light.  Neck: Normal range of motion. Neck supple.  Cardiovascular: Normal rate, regular rhythm and intact distal pulses.   No murmur heard. Pulmonary/Chest: Effort normal and breath sounds normal. No respiratory distress. He has no wheezes. He has no rales.  Currently protecting his airway  Abdominal: Soft. He exhibits no distension. There is no tenderness. There is no rebound and no guarding.  Musculoskeletal: Normal range of motion. He exhibits no edema or tenderness.  Neurological: He is alert.  Expressive aphasia, dense right-sided facial droop with right upper extremity weakness, right hemi-neglect and decreased right-sided sensation. Difficulty following commands and slowed cognitively.  Skin: Skin is warm and dry. No rash noted. No erythema.  Psychiatric: He has a normal mood and affect. His behavior is normal.  Nursing note and vitals reviewed.    ED Treatments / Results  Labs (all labs ordered are listed, but only abnormal results are displayed) Labs Reviewed  CBC - Abnormal; Notable for the following:       Result Value   WBC 15.4 (*)    Hemoglobin 18.2 (*)    All other components within normal limits  DIFFERENTIAL - Abnormal; Notable for the following:  Neutro Abs 9.1 (*)    Lymphs Abs 5.0 (*)    All other components within normal limits  COMPREHENSIVE METABOLIC PANEL - Abnormal; Notable for the following:    Glucose, Bld 108 (*)    All other components within normal limits  URINALYSIS, ROUTINE W REFLEX MICROSCOPIC (NOT AT [facility]) - Abnormal; Notable for the following:    pH 8.5 (*)    All other components within normal limits  CBG MONITORING, ED - Abnormal; Notable for the following:    Glucose-Capillary 111 (*)    All other components within normal limits  I-STAT CHEM 8, ED - Abnormal; Notable for the  following:    Glucose, Bld 106 (*)    Calcium, Ion 1.05 (*)    Hemoglobin 18.0 (*)    HCT 53.0 (*)    All other components within normal limits  POCT I-STAT 3, ART BLOOD GAS (G3+) - Abnormal; Notable for the following:    pH, Arterial 7.260 (*)    pO2, Arterial 354.0 (*)    Acid-base deficit 6.0 (*)    All other components within normal limits  PROTIME-INR  APTT  BLOOD GAS, ARTERIAL  TRIGLYCERIDES  PROCALCITONIN  PROCALCITONIN  HEMOGLOBIN A1C  LIPID PANEL  CBC WITH DIFFERENTIAL/PLATELET  BASIC METABOLIC PANEL  I-STAT TROPOININ, ED  CBG MONITORING, ED    EKG  EKG Interpretation None       Radiology Ct Head Code Stroke W/o Cm  Result Date: 12/08/2015 CLINICAL DATA:  Code stroke.  Right-sided weakness EXAM: CT HEAD WITHOUT CONTRAST TECHNIQUE: Contiguous axial images were obtained from the base of the skull through the vertex without intravenous contrast. COMPARISON:  CT head 03/11/2004 FINDINGS: Brain: Ventricle size normal. Cerebral volume normal. Negative for acute infarct. No significant chronic ischemic change. 5 mm dense calcification in the left anterior head of caudate unchanged from the prior study. Probably an area of chronic infection such as granulomatous disease or cysticercosis. No surrounding edema. No other calcifications. Vascular: No hyperdense vessel or unexpected calcification. Skull: Negative Sinuses/Orbits: Mild mucosal edema in the paranasal sinuses. Mastoid sinus clear. Other: None ASPECTS ([place] Stroke Program Early CT Score) - Ganglionic level infarction (caudate, lentiform nuclei, internal capsule, insula, M1-M3 cortex): 7 - Supraganglionic infarction (M4-M6 cortex): 3 Total score (0-10 with 10 being normal): 10 IMPRESSION: 1. Negative for acute infarct. 2. Stable 5 mm calcification left head of caudate likely due to chronic infection. 3. ASPECTS is 10 These results were called by telephone at the time of interpretation on 12/08/2015 at 6:19 pm to Dr.  Leonie Man, who verbally acknowledged these results. Electronically Signed   By: Franchot Gallo M.D.   On: 12/08/2015 18:19    Procedures Procedures (including critical care time)  Medications Ordered in ED Medications  alteplase (ACTIVASE) 1 mg/mL infusion 70 mg (70 mg Intravenous New Bag/Given 12/08/15 1822)     Initial Impression / Assessment and Plan / ED Course  I have reviewed the triage vital signs and the nursing notes.  Pertinent labs & imaging results that were available during my care of the patient were reviewed by me and considered in my medical decision making (see chart for details).  Clinical Course   Patient is a 50 year old healthy male presenting as a code stroke with dense right-sided symptoms. He has expressive a patient but airway is intact. Vital signs are consistent with persistent hypertension. Unknown social history. Last seen normal was at 5:15.  Stroke team at bedside with an NIH score of 14.  CT shows complete occlusion of the M1 patient will be taken to IR. TPA started.  Family states the patient has not on any anticoagulation at this time.  After starting TPA improvement NIH from 14 to 1 CRITICAL CARE Performed by: Blanchie Dessert Total critical care time: 30 minutes Critical care time was exclusive of separately billable procedures and treating other patients. Critical care was necessary to treat or prevent imminent or life-threatening deterioration. Critical care was time spent personally by me on the following activities: development of treatment plan with patient and/or surrogate as well as nursing, discussions with consultants, evaluation of patient's response to treatment, examination of patient, obtaining history from patient or surrogate, ordering and performing treatments and interventions, ordering and review of laboratory studies, ordering and review of radiographic studies, pulse oximetry and re-evaluation of patient's condition.    Final Clinical  Impressions(s) / ED Diagnoses   Final diagnoses:  Right hemiplegia [facility])    New Prescriptions New Prescriptions   No medications on file     Blanchie Dessert, MD 12/09/15 0041

## 2015-12-08 NOTE — Consult Note (Signed)
PULMONARY / CRITICAL CARE MEDICINE   Name: James Cortez MRN: [id number] DOB: 10/24/1965    ADMISSION DATE:  12/08/2015 CONSULTATION DATE:  12/08/2015  REFERRING MD:  Dr. Nicole Kindred  CHIEF COMPLAINT:  Stroke  HISTORY OF PRESENT ILLNESS:   50 year old male with no significant past medical history. He presented to [facility] October 31 after developing acute onset right sided facial droop and right-sided hemiparesis with associated aphasia while at his daughter's school. He was brought to the emergency department as a code stroke or CT angiogram of the head showed acute occlusion of the M1 segment of the left MCA. He was given systemic TPA and was taken to interventional radiology for revascularization, which was successful. Post procedurally he was taken to the ICU for recovery where he remains on the ventilator. PCCM consult and for vent/medical management.  PAST MEDICAL HISTORY :  He  has a past medical history of Skin cancer.  PAST SURGICAL HISTORY: He  has a past surgical history that includes Skin surgery.  No Known Allergies  No current facility-administered medications on file prior to encounter.    Current Outpatient Prescriptions on File Prior to Encounter  Medication Sig  . cyclobenzaprine (FLEXERIL) 10 MG tablet Take 1 tablet (10 mg total) by mouth 2 (two) times daily as needed for muscle spasms.  Marland Kitchen ibuprofen (ADVIL,MOTRIN) 600 MG tablet Take 1 tablet (600 mg total) by mouth every 6 (six) hours as needed.    FAMILY HISTORY:  His has no family status information on file.    SOCIAL HISTORY: He  reports that he has been smoking Cigarettes.  He has been smoking about 1.00 pack per day. He has never used smokeless tobacco. He reports that he drinks alcohol. He reports that he does not use drugs.  REVIEW OF SYSTEMS:   Unable as patient in intubated and sedated  SUBJECTIVE:    VITAL SIGNS: BP 135/77   Pulse 73   Temp 97.8 F (36.6 C) (Oral)   Resp 20   Wt 77.5  kg (170 lb 13.7 oz)   SpO2 97%   BMI 26.76 kg/m   HEMODYNAMICS:    VENTILATOR SETTINGS:    INTAKE / OUTPUT: No intake/output data recorded.  PHYSICAL EXAMINATION: General:  Male of normal body habitus in NAD Neuro:  Sedated.  HEENT:  [place]/AT, PERRL, no JVD Cardiovascular:  RRR, no MRG Lungs:  Clear vent supported breaths Abdomen:  Soft, non-tender, non-distended Musculoskeletal:  No acute deformity Skin:  Grossly intact  LABS:  BMET  Recent Labs Lab 12/08/15 1755 12/08/15 1759  NA 140 138  K 3.8 3.9  CL 104 106  CO2  --  22  BUN 14 13  CREATININE 0.70 0.90  GLUCOSE 106* 108*    Electrolytes  Recent Labs Lab 12/08/15 1759  CALCIUM 9.3    CBC  Recent Labs Lab 12/08/15 1755 12/08/15 1759  WBC  --  15.4*  HGB 18.0* 18.2*  HCT 53.0* 51.2  PLT  --  264    Coag's  Recent Labs Lab 12/08/15 1759  APTT 29  INR 0.99    Sepsis Markers No results for input(s): LATICACIDVEN, PROCALCITON, O2SATVEN in the last 168 hours.  ABG No results for input(s): PHART, PCO2ART, PO2ART in the last 168 hours.  Liver Enzymes  Recent Labs Lab 12/08/15 1759  AST 27  ALT 34  ALKPHOS 71  BILITOT 0.3  ALBUMIN 4.4    Cardiac Enzymes No results for input(s): TROPONINI, PROBNP in the last  168 hours.  Glucose  Recent Labs Lab 12/08/15 1750  GLUCAP 111*    Imaging Ct Head Code Stroke W/o Cm  Result Date: 12/08/2015 CLINICAL DATA:  Code stroke.  Right-sided weakness EXAM: CT HEAD WITHOUT CONTRAST TECHNIQUE: Contiguous axial images were obtained from the base of the skull through the vertex without intravenous contrast. COMPARISON:  CT head 03/11/2004 FINDINGS: Brain: Ventricle size normal. Cerebral volume normal. Negative for acute infarct. No significant chronic ischemic change. 5 mm dense calcification in the left anterior head of caudate unchanged from the prior study. Probably an area of chronic infection such as granulomatous disease or cysticercosis.  No surrounding edema. No other calcifications. Vascular: No hyperdense vessel or unexpected calcification. Skull: Negative Sinuses/Orbits: Mild mucosal edema in the paranasal sinuses. Mastoid sinus clear. Other: None ASPECTS ([place] Stroke Program Early CT Score) - Ganglionic level infarction (caudate, lentiform nuclei, internal capsule, insula, M1-M3 cortex): 7 - Supraganglionic infarction (M4-M6 cortex): 3 Total score (0-10 with 10 being normal): 10 IMPRESSION: 1. Negative for acute infarct. 2. Stable 5 mm calcification left head of caudate likely due to chronic infection. 3. ASPECTS is 10 These results were called by telephone at the time of interpretation on 12/08/2015 at 6:19 pm to Dr. Leonie Man, who verbally acknowledged these results. Electronically Signed   By: Franchot Gallo M.D.   On: 12/08/2015 18:19     STUDIES:  CT head 10/31 >  Negative for acute infarct. Stable 5 mm calcification left head of caudate   CTA head/neck 10/31 > L M1 occlusion, official read pending >>>  CULTURES:   ANTIBIOTICS:   SIGNIFICANT EVENTS:   LINES/TUBES:   DISCUSSION: 50 year old male suffered acute stroke. IV TPA and Neuro IR done. To ICU on vent for recovery.   ASSESSMENT / PLAN:  PULMONARY A: Inability to protect airway in post-operative setting  P:   Full vent support CXR for ETT placement ABG VAP prevention bundle  CARDIOVASCULAR A:  Needs tight BP control post Neuro IR procedure.  P:  Telemetry BP goals per IR (120-140 SBP) PRN labetalol May need nicardipine infusion as BP has been labile.  RENAL A:   No acute issues  P:   Follow BMP  GASTROINTESTINAL A:   No acute issues  P:   NPO for tonight Pepcid for SUP while on vent   HEMATOLOGIC A:   No acute issues  P:  Follow CBC in AM Anticoagulation/ VTE ppx per neuro SCDs  INFECTIOUS A:   Leukocytosis likely stress response  P:   Monitor WBC and fever curve Assess PCT  ENDOCRINE A:   No acute  issues  P:   Follow glucose on chemistry  NEUROLOGIC A:   Acute CVA s/p systemic TPA and Neuro IR revascularization  P:   RASS goal: -1 to -2 Propofol infusion PRN fentanyl for analgesia   FAMILY  - Updates:   - Inter-disciplinary family meet or Palliative Care meeting due by:  11/6   Georgann Housekeeper, AGACNP-BC [place] [facility]/Critical Care Pager [phone] or [phone]  12/08/2015 11:16 PM  Attending Note:  50 year old male with no significant PMH who presents to the hospital with acute onset right sided facial droop and right-sided hemiparesis with associated aphasia.  Patient was given tPA then was sent to IR and stent was placed in the left MCA.  PCCM was consulted for vent management.  Currently one exam, patient is sedated and intubated, unable to perform a full neuro exam.  Lungs are  clear to auscultation bilaterally.  I reviewed CXR myself, no acute disease noted.  ABG noted and vent adjusted accordingly.  F/U ABG.  Full sedation until ready for sheaths to be removed and neuro status can be assessed.  Hold off TF for now.  Will f/u.  The patient is critically ill with multiple organ systems failure and requires high complexity decision making for assessment and support, frequent evaluation and titration of therapies, application of advanced monitoring technologies and extensive interpretation of multiple databases.   Critical Care Time devoted to patient care services described in this note is  45  Minutes. This time reflects time of care of this signee Dr Jennet Maduro. This critical care time does not reflect procedure time, or teaching time or supervisory time of PA/NP/Med student/Med Resident etc but could involve care discussion time.  Rush Farmer, M.D. [facility] Pulmonary/Critical Care Medicine. Pager: [phone]. After hours pager: [phone].

## 2015-12-08 NOTE — H&P (Signed)
Admission H&P    Chief Complaint: Acute onset of right-sided weakness and facial droop as well as speech difficulty.  HPI: James Cortez is an 50 y.o. male with no known risk factors for stroke other than the back or use, presenting with acute onset of right facial droop, right hemiparesis and difficulty with speech. Patient was last known well at 5:23 PM today. He has no history of stroke nor TIA. He has not been on antiplatelet therapy. CT scan of the head showed no acute  intracranial abnormality. CT angiogram showed acute occlusion of the M1 segment of the left middle cerebral artery. Patient was deemed a candidate for TPA. Initial NIH stroke score was 14. PA was administered and patient showed significant improvement. NIH stroke score went from 14-5. Patient was taken to interventional radiology Cerebral angiogram showed persistent occlusion of left MCA, M1 segment. Endovascular revascularization was elected.  LSN: 5:23 PM on 12/08/2015 tPA Given: Yes mRankin:  Past Medical History:  Diagnosis Date  . Skin cancer     Past Surgical History:  Procedure Laterality Date  . SKIN SURGERY      History reviewed. No pertinent family history. Social History:  reports that he has been smoking Cigarettes.  He has been smoking about 1.00 pack per day. He has never used smokeless tobacco. He reports that he drinks alcohol. He reports that he does not use drugs.  Allergies: No Known Allergies   (Not in a hospital admission)  ROS: History obtained from spouse and daughter.  General ROS: negative for - chills, fatigue, fever, night sweats, weight gain or weight loss Psychological ROS: negative for - behavioral disorder, hallucinations, memory difficulties, mood swings or suicidal ideation Ophthalmic ROS: negative for - blurry vision, double vision, eye pain or loss of vision ENT ROS: negative for - epistaxis, nasal discharge, oral lesions, sore throat, tinnitus or vertigo Allergy and Immunology  ROS: negative for - hives or itchy/watery eyes Hematological and Lymphatic ROS: negative for - bleeding problems, bruising or swollen lymph nodes Endocrine ROS: negative for - galactorrhea, hair pattern changes, polydipsia/polyuria or temperature intolerance Respiratory ROS: negative for - cough, hemoptysis, shortness of breath or wheezing Cardiovascular ROS: negative for - chest pain, dyspnea on exertion, edema or irregular heartbeat Gastrointestinal ROS: negative for - abdominal pain, diarrhea, hematemesis, nausea/vomiting or stool incontinence Genito-Urinary ROS: negative for - dysuria, hematuria, incontinence or urinary frequency/urgency Musculoskeletal ROS: negative for - joint swelling or muscular weakness Neurological ROS: as noted in HPI Dermatological ROS: negative for rash and skin lesion changes  Physical Examination: Blood pressure 135/85, pulse 73, temperature 97.8 F (36.6 C), temperature source Oral, resp. rate 23, weight 77.5 kg (170 lb 13.7 oz), SpO2 97 %.  HEENT-  Normocephalic, no lesions, without obvious abnormality.  Normal external eye and conjunctiva.  Normal TM's bilaterally.  Normal auditory canals and external ears. Normal external nose, mucus membranes and septum.  Normal pharynx. Neck supple with no masses, nodes, nodules or enlargement. Cardiovascular - regular rate and rhythm, S1, S2 normal, no murmur, click, rub or gallop Lungs - chest clear, no wheezing, rales, normal symmetric air entry Abdomen - soft, non-tender; bowel sounds normal; no masses,  no organomegaly Extremities - no joint deformities, effusion, or inflammation and no edema  Neurologic Examination: Mental Status: Slightly lethargic with marked expressive aphasia and moderate receptive aphasia. Patient had neglect of his right side and difficulty following commands. Cranial Nerves: II-right homonymous hemianopsia. III/IV/VI-Pupils were equal and reacted. Extraocular movements were limited to left  gaze and rightward to the midline conjugately.    V/VII-reduced perception of tactile sensation over the right side of the face; moderate right lower facial weakness. VIII-normal. X-moderately dysarthric, unintelligible speech. Motor: Mild drift of right upper extremity, motor exam otherwise unremarkable except for increased muscle tone in right leg compared to left Sensory: Normal throughout. Deep Tendon Reflexes: Asymmetric DTRs with marked hyperreflexia of right extremities, including sustained clonus at right knee and ankle. Plantars: Extensor on the right and mute on the left Cerebellar: Unable to assess due to difficulty understanding commands. Carotid auscultation: Normal  Results for orders placed or performed during the hospital encounter of 12/08/15 (from the past 48 hour(s))  CBG monitoring, ED     Status: Abnormal   Collection Time: 12/08/15  5:50 PM  Result Value Ref Range   Glucose-Capillary 111 (H) 65 - 99 mg/dL  I-stat troponin, ED     Status: None   Collection Time: 12/08/15  5:53 PM  Result Value Ref Range   Troponin i, poc 0.00 0.00 - 0.08 ng/mL   Comment 3            Comment: Due to the release kinetics of cTnI, a negative result within the first hours of the onset of symptoms does not rule out myocardial infarction with certainty. If myocardial infarction is still suspected, repeat the test at appropriate intervals.   I-Stat Chem 8, ED     Status: Abnormal   Collection Time: 12/08/15  5:55 PM  Result Value Ref Range   Sodium 140 135 - 145 mmol/L   Potassium 3.8 3.5 - 5.1 mmol/L   Chloride 104 101 - 111 mmol/L   BUN 14 6 - 20 mg/dL   Creatinine, Ser 0.70 0.61 - 1.24 mg/dL   Glucose, Bld 106 (H) 65 - 99 mg/dL   Calcium, Ion 1.05 (L) 1.15 - 1.40 mmol/L   TCO2 23 0 - 100 mmol/L   Hemoglobin 18.0 (H) 13.0 - 17.0 g/dL   HCT 53.0 (H) 39.0 - 52.0 %  Protime-INR     Status: None   Collection Time: 12/08/15  5:59 PM  Result Value Ref Range   Prothrombin Time  13.1 11.4 - 15.2 seconds   INR 0.99   APTT     Status: None   Collection Time: 12/08/15  5:59 PM  Result Value Ref Range   aPTT 29 24 - 36 seconds  CBC     Status: Abnormal   Collection Time: 12/08/15  5:59 PM  Result Value Ref Range   WBC 15.4 (H) 4.0 - 10.5 K/uL   RBC 5.77 4.22 - 5.81 MIL/uL   Hemoglobin 18.2 (H) 13.0 - 17.0 g/dL   HCT 51.2 39.0 - 52.0 %   MCV 88.7 78.0 - 100.0 fL   MCH 31.5 26.0 - 34.0 pg   MCHC 35.5 30.0 - 36.0 g/dL   RDW 12.9 11.5 - 15.5 %   Platelets 264 150 - 400 K/uL  Differential     Status: Abnormal   Collection Time: 12/08/15  5:59 PM  Result Value Ref Range   Neutrophils Relative % 58 %   Neutro Abs 9.1 (H) 1.7 - 7.7 K/uL   Lymphocytes Relative 33 %   Lymphs Abs 5.0 (H) 0.7 - 4.0 K/uL   Monocytes Relative 7 %   Monocytes Absolute 1.0 0.1 - 1.0 K/uL   Eosinophils Relative 1 %   Eosinophils Absolute 0.2 0.0 - 0.7 K/uL   Basophils Relative 1 %  Basophils Absolute 0.1 0.0 - 0.1 K/uL  Comprehensive metabolic panel     Status: Abnormal   Collection Time: 12/08/15  5:59 PM  Result Value Ref Range   Sodium 138 135 - 145 mmol/L   Potassium 3.9 3.5 - 5.1 mmol/L   Chloride 106 101 - 111 mmol/L   CO2 22 22 - 32 mmol/L   Glucose, Bld 108 (H) 65 - 99 mg/dL   BUN 13 6 - 20 mg/dL   Creatinine, Ser 0.90 0.61 - 1.24 mg/dL   Calcium 9.3 8.9 - 10.3 mg/dL   Total Protein 7.1 6.5 - 8.1 g/dL   Albumin 4.4 3.5 - 5.0 g/dL   AST 27 15 - 41 U/L   ALT 34 17 - 63 U/L   Alkaline Phosphatase 71 38 - 126 U/L   Total Bilirubin 0.3 0.3 - 1.2 mg/dL   GFR calc non Af Amer >60 >60 mL/min   GFR calc Af Amer >60 >60 mL/min    Comment: (NOTE) The eGFR has been calculated using the CKD EPI equation. This calculation has not been validated in all clinical situations. eGFR's persistently <60 mL/min signify possible Chronic Kidney Disease.    Anion gap 10 5 - 15   Ct Head Code Stroke W/o Cm  Result Date: 12/08/2015 CLINICAL DATA:  Code stroke.  Right-sided weakness  EXAM: CT HEAD WITHOUT CONTRAST TECHNIQUE: Contiguous axial images were obtained from the base of the skull through the vertex without intravenous contrast. COMPARISON:  CT head 03/11/2004 FINDINGS: Brain: Ventricle size normal. Cerebral volume normal. Negative for acute infarct. No significant chronic ischemic change. 5 mm dense calcification in the left anterior head of caudate unchanged from the prior study. Probably an area of chronic infection such as granulomatous disease or cysticercosis. No surrounding edema. No other calcifications. Vascular: No hyperdense vessel or unexpected calcification. Skull: Negative Sinuses/Orbits: Mild mucosal edema in the paranasal sinuses. Mastoid sinus clear. Other: None ASPECTS ([place] Stroke Program Early CT Score) - Ganglionic level infarction (caudate, lentiform nuclei, internal capsule, insula, M1-M3 cortex): 7 - Supraganglionic infarction (M4-M6 cortex): 3 Total score (0-10 with 10 being normal): 10 IMPRESSION: 1. Negative for acute infarct. 2. Stable 5 mm calcification left head of caudate likely due to chronic infection. 3. ASPECTS is 10 These results were called by telephone at the time of interpretation on 12/08/2015 at 6:19 pm to Dr. Leonie Man, who verbally acknowledged these results. Electronically Signed   By: Franchot Gallo M.D.   On: 12/08/2015 18:19    Assessment: 50 y.o. male with no known risk factors for stroke other than cigarette smoking, presenting with acute left MCA territory ischemic infarction with M1 segment occlusion demonstrated on CT angiogram. Patient improved significantly with administration of TPA IV. However, left MCA M1 segment remained occluded on cerebral angiography.  Stroke Risk Factors - smoking  Plan: 1. Neuro ICU admission following IR procedures 2. MRI of the brain without contrast 3. PT consult, OT consult, Speech consult 4. Echocardiogram 5. Hypercoagulopathy panel 6. Prophylactic therapy-Antiplatelet med: Aspirin  7. Risk  factor modification 8. HgbA1c, fasting lipid panel  This patient is critically ill and at significant risk of neurological worsening or death, and care requires constant monitoring of vital signs, hemodynamics,respiratory and cardiac monitoring, neurological assessment, discussion with family, other specialists and medical decision making of high complexity. Total critical care time was 90 minutes.  C.R. Nicole Kindred, MD [facility] [phone]  12/08/2015, 7:25 PM

## 2015-12-08 NOTE — Anesthesia Procedure Notes (Signed)
Procedure Name: Intubation Date/Time: 12/08/2015 7:55 PM Performed by: Valetta Fuller Pre-anesthesia Checklist: Patient identified, Emergency Drugs available, Suction available and Patient being monitored Patient Re-evaluated:Patient Re-evaluated prior to inductionOxygen Delivery Method: Circle system utilized Preoxygenation: Pre-oxygenation with 100% oxygen Intubation Type: IV induction, Rapid sequence and Cricoid Pressure applied Laryngoscope Size: Miller and 2 Grade View: Grade II Tube type: Subglottic suction tube Tube size: 7.5 mm Number of attempts: 1 Airway Equipment and Method: Stylet Placement Confirmation: ETT inserted through vocal cords under direct vision,  positive ETCO2 and breath sounds checked- equal and bilateral Secured at: 23 cm Tube secured with: Tape Dental Injury: Teeth and Oropharynx as per pre-operative assessment

## 2015-12-08 NOTE — Procedures (Signed)
S/P lt common carotid arteriogram,followed by by complete revascularization of occluded Lt MCA m1 , with x pass with 4 mm x 40 mm solitaire retrieval device and stent assisted angioplasty of severe underlying stenosis with reocclusion achieving a TICI 2 b revascularization Also received 8 mg of superselective IA tpa.

## 2015-12-08 NOTE — Code Documentation (Addendum)
50 year old male presents to [facility] as code stroke via [facility].  He was at his daughters school when he had witnessed onset of right side weakness and inability to talk at 15.  On arrival he was mute - right hemiparesis - alert with some resp distress - CT scan obtained along with CTA - post scan his symptoms were better - he was able to move right side - on return to the ED his NIHHS was 14.  Dr. Leonie Man and Dr. Nicole Kindred at the bedside. Family at bedside speaking with MD's.  CTA reported M1 occulsion - foley was inserted and  tPA was administered. Patient was prepped for IR.  His NIHHS was reduced to 5. BP remained stable - handoff to [employer] - patient for IR.

## 2015-12-09 ENCOUNTER — Inpatient Hospital Stay (HOSPITAL_COMMUNITY): Payer: Self-pay

## 2015-12-09 ENCOUNTER — Encounter (HOSPITAL_COMMUNITY): Payer: Self-pay | Admitting: Radiology

## 2015-12-09 DIAGNOSIS — I639 Cerebral infarction, unspecified: Secondary | ICD-10-CM

## 2015-12-09 DIAGNOSIS — I10 Essential (primary) hypertension: Secondary | ICD-10-CM

## 2015-12-09 DIAGNOSIS — J9620 Acute and chronic respiratory failure, unspecified whether with hypoxia or hypercapnia: Secondary | ICD-10-CM

## 2015-12-09 DIAGNOSIS — G8191 Hemiplegia, unspecified affecting right dominant side: Secondary | ICD-10-CM

## 2015-12-09 LAB — GLUCOSE, CAPILLARY: GLUCOSE-CAPILLARY: 103 mg/dL — AB (ref 65–99)

## 2015-12-09 LAB — POCT I-STAT 3, ART BLOOD GAS (G3+)
Acid-base deficit: 6 mmol/L — ABNORMAL HIGH (ref 0.0–2.0)
Bicarbonate: 21.7 mmol/L (ref 20.0–28.0)
O2 SAT: 100 %
PCO2 ART: 48 mmHg (ref 32.0–48.0)
PH ART: 7.26 — AB (ref 7.350–7.450)
TCO2: 23 mmol/L (ref 0–100)
pO2, Arterial: 354 mmHg — ABNORMAL HIGH (ref 83.0–108.0)

## 2015-12-09 LAB — CBC WITH DIFFERENTIAL/PLATELET
BASOS PCT: 0 %
Basophils Absolute: 0 10*3/uL (ref 0.0–0.1)
EOS ABS: 0 10*3/uL (ref 0.0–0.7)
EOS PCT: 0 %
HCT: 43.3 % (ref 39.0–52.0)
HEMOGLOBIN: 14.3 g/dL (ref 13.0–17.0)
LYMPHS ABS: 2.9 10*3/uL (ref 0.7–4.0)
Lymphocytes Relative: 15 %
MCH: 29.7 pg (ref 26.0–34.0)
MCHC: 33 g/dL (ref 30.0–36.0)
MCV: 89.8 fL (ref 78.0–100.0)
MONO ABS: 0.8 10*3/uL (ref 0.1–1.0)
MONOS PCT: 4 %
Neutro Abs: 15.9 10*3/uL — ABNORMAL HIGH (ref 1.7–7.7)
Neutrophils Relative %: 81 %
PLATELETS: 234 10*3/uL (ref 150–400)
RBC: 4.82 MIL/uL (ref 4.22–5.81)
RDW: 13.1 % (ref 11.5–15.5)
WBC: 19.7 10*3/uL — ABNORMAL HIGH (ref 4.0–10.5)

## 2015-12-09 LAB — BASIC METABOLIC PANEL
Anion gap: 6 (ref 5–15)
BUN: 8 mg/dL (ref 6–20)
CALCIUM: 8 mg/dL — AB (ref 8.9–10.3)
CHLORIDE: 111 mmol/L (ref 101–111)
CO2: 23 mmol/L (ref 22–32)
CREATININE: 0.71 mg/dL (ref 0.61–1.24)
GFR calc non Af Amer: >60 mL/min (ref >60)
Glucose, Bld: 112 mg/dL — ABNORMAL HIGH (ref 65–99)
Potassium: 3.8 mmol/L (ref 3.5–5.1)
SODIUM: 140 mmol/L (ref 135–145)

## 2015-12-09 LAB — LIPID PANEL
CHOL/HDL RATIO: 6.6 ratio
CHOLESTEROL: 159 mg/dL (ref 0–200)
HDL: 24 mg/dL — AB (ref >40)
LDL Cholesterol: 87 mg/dL (ref 0–99)
TRIGLYCERIDES: 240 mg/dL — AB (ref <150)
VLDL: 48 mg/dL — AB (ref 0–40)

## 2015-12-09 LAB — MRSA PCR SCREENING: MRSA BY PCR: NEGATIVE

## 2015-12-09 LAB — TRIGLYCERIDES: Triglycerides: 255 mg/dL — ABNORMAL HIGH (ref <150)

## 2015-12-09 LAB — PROCALCITONIN: Procalcitonin: <0.1 ng/mL

## 2015-12-09 MED ORDER — CHLORHEXIDINE GLUCONATE 0.12% ORAL RINSE (MEDLINE KIT)
15.0000 mL | Freq: Two times a day (BID) | OROMUCOSAL | Status: DC
Start: 2015-12-09 — End: 2015-12-09

## 2015-12-09 MED ORDER — FENTANYL CITRATE (PF) 100 MCG/2ML IJ SOLN
25.0000 ug | INTRAMUSCULAR | Status: DC | PRN
  Administered 2015-12-09 – 2015-12-10 (×2): 100 ug via INTRAVENOUS
  Filled 2015-12-09 (×2): qty 2

## 2015-12-09 MED ORDER — ORAL CARE MOUTH RINSE
15.0000 mL | OROMUCOSAL | Status: DC
  Administered 2015-12-09 – 2015-12-15 (×47): 15 mL via OROMUCOSAL

## 2015-12-09 NOTE — Progress Notes (Signed)
Patient transported to MRI and back to room [id number] without complications.

## 2015-12-09 NOTE — Progress Notes (Signed)
Referring Physician(s): P Leonie Man  Supervising Physician: Luanne Bras  Patient Status:  [facility] - In-pt  Chief Complaint:  CVA Lt MCA occlusion   Subjective:  10/31: CEREBRAL ANGIOGRAM [id number] (Custom)]       S/P lt common carotid arteriogram,followed by by complete revascularization of occluded Lt MCA m1 , with x pass with 4 mm x 40 mm solitaire retrieval device and stent assisted angioplasty of severe underlying stenosis with reocclusion achieving a TICI 2 b revascularization Also received 8 mg of superselective IA tpa.      vent Sedated Barely any response to me Seems to try to open eyes to my voice No movement  CTA today:IMPRESSION: 1. Developing areas of hypoattenuation involving the posterior left lentiform nucleus, the centrum semi ovale, and watershed cortical infarcts without hemorrhage. These represent areas of developing infarct within the left MCA territory of tissue at highest risk. No larger MCA territory infarct is evident. 2. Left MCA stent spanning from the proximal left M1 segment beyond the bifurcation.  Allergies: Review of patient's allergies indicates no known allergies.  Medications: Prior to Admission medications   Medication Sig Start Date End Date Taking? Authorizing Provider  cyclobenzaprine (FLEXERIL) 10 MG tablet Take 1 tablet (10 mg total) by mouth 2 (two) times daily as needed for muscle spasms. 01/14/15   [name], PA-C  ibuprofen (ADVIL,MOTRIN) 600 MG tablet Take 1 tablet (600 mg total) by mouth every 6 (six) hours as needed. 01/14/15   [name], PA-C     Vital Signs: BP 133/81   Pulse 69   Temp 97.6 F (36.4 C) (Axillary)   Resp (!) 22   Ht 5\' 10"  (1.778 m)   Wt 164 lb 3.9 oz (74.5 kg)   SpO2 100%   BMI 23.57 kg/m   Physical Exam  Eyes:  Does seem to try to open eyes to me  Pulmonary/Chest:  vent  Abdominal: Soft.  Musculoskeletal:  No movement  Neurological:  No response  Skin:  Skin is warm.  Rt groin sheath intact No bleeding No hematoma 1+Pulse foot  Nursing note and vitals reviewed.   Imaging: Ct Angio Head W Or Wo Contrast  Result Date: 12/08/2015 CLINICAL DATA:  Initial evaluation for acute right-sided weakness, right-sided facial droop. EXAM: CT ANGIOGRAPHY HEAD AND NECK TECHNIQUE: Multidetector CT imaging of the head and neck was performed using the standard protocol during bolus administration of intravenous contrast. Multiplanar CT image reconstructions and MIPs were obtained to evaluate the vascular anatomy. Carotid stenosis measurements (when applicable) are obtained utilizing NASCET criteria, using the distal internal carotid diameter as the denominator. CONTRAST:  50 cc of Isovue 370. COMPARISON:  Comparison made with prior noncontrast head CT performed earlier on the same day. FINDINGS: CTA NECK FINDINGS Aortic arch: Visualized aortic arch is of normal caliber with normal 3 vessel morphology. No high-grade stenosis at the origin of the great vessels. Visualized subclavian arteries are widely patent. Right carotid system: Right common carotid artery patent from its origin to the bifurcation. No significant atheromatous plaque about the right bifurcation. Right ICA widely patent from the bifurcation to the skullbase. No stenosis, dissection, or vascular occlusion identified within the right carotid artery system. Right external carotid artery and its branches within normal limits. Left carotid system: Left common carotid artery widely patent from its origin two the bifurcation. No significant atheromatous plaque about the left bifurcation. Left ICA widely patent from the bifurcation to the skullbase. No stenosis, dissection, or vascular  occlusion within the left carotid artery system. Left external carotid artery and its branches within normal limits. Vertebral arteries: Both of the vertebral arteries arise from the subclavian arteries. Vertebral arteries widely  patent within the neck without stenosis, dissection, or occlusion. Skeleton: No acute osseous abnormality. No worrisome lytic or blastic osseous lesions. Other neck: Visualized soft tissues of the neck within normal limits without acute abnormality. Thyroid normal. No adenopathy. Upper chest: Visualized upper mediastinum is normal. Visualized lungs are clear. Review of the MIP images confirms the above findings CTA HEAD FINDINGS Anterior circulation: The petrous, cavernous, and supraclinoid segments of the internal carotid arteries are widely patent without flow-limiting stenosis. ICA termini themselves appear to be patent. A1 segments patent. Right A1 segment hypoplastic. Anterior communicating artery normal. Anterior cerebral arch well opacified to their distal aspects. On the left, there is abrupt cough of the left M1 segment, which fever reflects high-grade stenosis or possibly embolic occlusion. Area of occlusion measures approximately 7 mm. There is reconstitution distally, with attenuated flow within the distal left M1 segment and left MCA branches. Right M1 segment widely patent. Right MCA bifurcation normal. Distal right MCA branches well opacified. Posterior circulation: Vertebral arteries are patent to the vertebrobasilar junction. Mild atheromatous irregularity within the left V4 segment without high-grade stenosis. Posterior inferior cerebral arteries patent bilaterally. Basilar artery widely patent. Superior cerebral arteries patent bilaterally. Right PCA supplied via the basilar artery and is well opacified to its distal aspect. There is a fetal type left PCA supplied via a widely patent left posterior communicating artery. Venous sinuses: Grossly patent. Anatomic variants: Fetal type left PCA. No aneurysm or vascular malformation. Delayed phase: Not performed. Review of the MIP images confirms the above findings IMPRESSION: CTA HEAD IMPRESSION: 1. Abrupt occlusion of the proximal left M1 segment,  with attenuated distal reconstitution. Finding may reflect a severe high-grade stenosis or possibly partially occlusive and/or recannulized thrombus. Area of occlusion measures approximately 7 mm in length. Per discussion with Dr. 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 This is not available for review or comparison at time of this dictation. While findings on this CT are felt to be within the M1 segment, it is possible that this lesion is the same lesion discussed on previous arteriogram from 2014. Direct comparison with previous images recommended if available. 2. Otherwise normal CTA of the head. CTA NECK IMPRESSION: Negative CTA of the neck. No flow-limiting or critical stenosis identified. Critical Value/emergent results were called by telephone at the time of interpretation on 12/08/2015 at 6:22 pm to Dr. Leonie Man , who verbally acknowledged these results. Electronically Signed   By: Jeannine Boga M.D.   On: 12/08/2015 19:05   Ct Head Wo Contrast  Result Date: 12/09/2015 CLINICAL DATA:  Progressive right-sided weakness. Status post revascularization of the left middle cerebral artery. EXAM: CT HEAD WITHOUT CONTRAST TECHNIQUE: Contiguous axial images were obtained from the base of the skull through the vertex without intravenous contrast. COMPARISON:  CTA head and neck 12/08/2015 FINDINGS: Brain: A developing area of hypoattenuation is present in the posterior left lentiform nucleus and extending superiorly within the centrum semi of ally. Small nonhemorrhagic cortical infarcts are present in a watershed distribution over the left convexity as well. Previously noted hyperdensity over the left hemisphere likely reflected some element of luxury perfusion. There is no evidence for subarachnoid hemorrhage on today's study. The right basal ganglia are intact.  Focal calcification along the anterior horn of  the left lateral ventricle is stable. Vascular: A left M1 stent extends beyond the bifurcation. No significant vascular calcifications are present otherwise. Skull: The calvarium is intact. Sinuses/Orbits: Diffuse mucosal thickening is present throughout the paranasal sinuses. There are no significant fluid levels. The mastoid air cells are clear. IMPRESSION: 1. Developing areas of hypoattenuation involving the posterior left lentiform nucleus, the centrum semi ovale, and watershed cortical infarcts without hemorrhage. These represent areas of developing infarct within the left MCA territory of tissue at highest risk. No larger MCA territory infarct is evident. 2. Left MCA stent spanning from the proximal left M1 segment beyond the bifurcation. Electronically Signed   By: San Morelle M.D.   On: 12/09/2015 09:33   Ct Head Wo Contrast  Result Date: 12/09/2015 CLINICAL DATA:  50 y/o  M; left M1 occlusion revascularization. EXAM: CT HEAD WITHOUT CONTRAST TECHNIQUE: Contiguous axial images were obtained from the base of the skull through the vertex without intravenous contrast. COMPARISON:  12/08/2015 CT head. FINDINGS: Brain: Stable calcification in the left frontal periventricular white matter. No evidence for large territory infarct, focal mass effect, or brain parenchymal hemorrhage. Increased density within sulci in the left parietal and lateral temporal lobe in comparison with the contralateral hemisphere is probably due to vessel engorgement and hyperemia post revascularization. Less likely subarachnoid hemorrhage. Vascular: Left M1 segment stent. Skull: Normal. Negative for fracture or focal lesion. Sinuses/Orbits: Moderate diffuse paranasal sinus mucosal thickening. Normal pneumatization of mastoid air cells. Orbits are unremarkable. Other: None. IMPRESSION: 1. No infarct or brain parenchymal hemorrhage identified. 2. Increased density within sulci in the left parietal and lateral temporal lobe in  comparison with the contralateral hemisphere is probably due to vessel engorgement and hyperemia post revascularization, less likely subarachnoid hemorrhage. Electronically Signed   By: Kristine Garbe M.D.   On: 12/09/2015 00:03   Ct Angio Neck W And/or Wo Contrast  Result Date: 12/08/2015 CLINICAL DATA:  Initial evaluation for acute right-sided weakness, right-sided facial droop. EXAM: CT ANGIOGRAPHY HEAD AND NECK TECHNIQUE: Multidetector CT imaging of the head and neck was performed using the standard protocol during bolus administration of intravenous contrast. Multiplanar CT image reconstructions and MIPs were obtained to evaluate the vascular anatomy. Carotid stenosis measurements (when applicable) are obtained utilizing NASCET criteria, using the distal internal carotid diameter as the denominator. CONTRAST:  50 cc of Isovue 370. COMPARISON:  Comparison made with prior noncontrast head CT performed earlier on the same day. FINDINGS: CTA NECK FINDINGS Aortic arch: Visualized aortic arch is of normal caliber with normal 3 vessel morphology. No high-grade stenosis at the origin of the great vessels. Visualized subclavian arteries are widely patent. Right carotid system: Right common carotid artery patent from its origin to the bifurcation. No significant atheromatous plaque about the right bifurcation. Right ICA widely patent from the bifurcation to the skullbase. No stenosis, dissection, or vascular occlusion identified within the right carotid artery system. Right external carotid artery and its branches within normal limits. Left carotid system: Left common carotid artery widely patent from its origin two the bifurcation. No significant atheromatous plaque about the left bifurcation. Left ICA widely patent from the bifurcation to the skullbase. No stenosis, dissection, or vascular occlusion within the left carotid artery system. Left external carotid artery and its branches within normal limits.  Vertebral arteries: Both of the vertebral arteries arise from the subclavian arteries. Vertebral arteries widely patent within the neck without stenosis, dissection, or occlusion. Skeleton: No  acute osseous abnormality. No worrisome lytic or blastic osseous lesions. Other neck: Visualized soft tissues of the neck within normal limits without acute abnormality. Thyroid normal. No adenopathy. Upper chest: Visualized upper mediastinum is normal. Visualized lungs are clear. Review of the MIP images confirms the above findings CTA HEAD FINDINGS Anterior circulation: The petrous, cavernous, and supraclinoid segments of the internal carotid arteries are widely patent without flow-limiting stenosis. ICA termini themselves appear to be patent. A1 segments patent. Right A1 segment hypoplastic. Anterior communicating artery normal. Anterior cerebral arch well opacified to their distal aspects. On the left, there is abrupt cough of the left M1 segment, which fever reflects high-grade stenosis or possibly embolic occlusion. Area of occlusion measures approximately 7 mm. There is reconstitution distally, with attenuated flow within the distal left M1 segment and left MCA branches. Right M1 segment widely patent. Right MCA bifurcation normal. Distal right MCA branches well opacified. Posterior circulation: Vertebral arteries are patent to the vertebrobasilar junction. Mild atheromatous irregularity within the left V4 segment without high-grade stenosis. Posterior inferior cerebral arteries patent bilaterally. Basilar artery widely patent. Superior cerebral arteries patent bilaterally. Right PCA supplied via the basilar artery and is well opacified to its distal aspect. There is a fetal type left PCA supplied via a widely patent left posterior communicating artery. Venous sinuses: Grossly patent. Anatomic variants: Fetal type left PCA. No aneurysm or vascular malformation. Delayed phase: Not performed. Review of the MIP images  confirms the above findings IMPRESSION: CTA HEAD IMPRESSION: 1. Abrupt occlusion of the proximal left M1 segment, with attenuated distal reconstitution. Finding may reflect a severe high-grade stenosis or possibly partially occlusive and/or recannulized thrombus. Area of occlusion measures approximately 7 mm in length. Per discussion with Dr. 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 This is not available for review or comparison at time of this dictation. While findings on this CT are felt to be within the M1 segment, it is possible that this lesion is the same lesion discussed on previous arteriogram from 2014. Direct comparison with previous images recommended if available. 2. Otherwise normal CTA of the head. CTA NECK IMPRESSION: Negative CTA of the neck. No flow-limiting or critical stenosis identified. Critical Value/emergent results were called by telephone at the time of interpretation on 12/08/2015 at 6:22 pm to Dr. Leonie Man , who verbally acknowledged these results. Electronically Signed   By: Jeannine Boga M.D.   On: 12/08/2015 19:05   Portable Chest Xray  Result Date: 12/09/2015 CLINICAL DATA:  50 y/o  M; endotracheal tube placement. EXAM: PORTABLE CHEST 1 VIEW COMPARISON:  None. FINDINGS: Cardiac silhouette within normal limits. Endotracheal tube tip 5.5 cm from carina. Endotracheal tube tip below the field of the in the abdomen, proximal side hole near the gastroesophageal junction. Clear lungs. No pneumothorax or effusion. IMPRESSION: Endotracheal tube tip 5.5 cm from carina at lower clavicle level. Enteric tube tip in the abdomen with proximal side hole near the gastroesophageal junction, suggest advancement. Clear lung. Electronically Signed   By: Kristine Garbe M.D.   On: 12/09/2015 00:05   Dg Abd Portable 1v  Result Date: 12/09/2015 CLINICAL DATA:  50 y/o  M; nasogastric  tube placement. EXAM: PORTABLE ABDOMEN - 1 VIEW COMPARISON:  None. FINDINGS: Normal bowel gas pattern. Enteric tube tip in proximal stomach with proximal side hole near the gastroesophageal junction, suggest advancement. Contrast accumulation in the kidneys from recent IV contrast administration.  Mild degenerative changes of lumbar spine. IMPRESSION: Enteric tube tip in proximal stomach with proximal side hole near the gastroesophageal junction, suggest advancement. Electronically Signed   By: Kristine Garbe M.D.   On: 12/09/2015 00:06   Ct Head Code Stroke W/o Cm  Result Date: 12/08/2015 CLINICAL DATA:  Code stroke.  Right-sided weakness EXAM: CT HEAD WITHOUT CONTRAST TECHNIQUE: Contiguous axial images were obtained from the base of the skull through the vertex without intravenous contrast. COMPARISON:  CT head 03/11/2004 FINDINGS: Brain: Ventricle size normal. Cerebral volume normal. Negative for acute infarct. No significant chronic ischemic change. 5 mm dense calcification in the left anterior head of caudate unchanged from the prior study. Probably an area of chronic infection such as granulomatous disease or cysticercosis. No surrounding edema. No other calcifications. Vascular: No hyperdense vessel or unexpected calcification. Skull: Negative Sinuses/Orbits: Mild mucosal edema in the paranasal sinuses. Mastoid sinus clear. Other: None ASPECTS ([place] Stroke Program Early CT Score) - Ganglionic level infarction (caudate, lentiform nuclei, internal capsule, insula, M1-M3 cortex): 7 - Supraganglionic infarction (M4-M6 cortex): 3 Total score (0-10 with 10 being normal): 10 IMPRESSION: 1. Negative for acute infarct. 2. Stable 5 mm calcification left head of caudate likely due to chronic infection. 3. ASPECTS is 10 These results were called by telephone at the time of interpretation on 12/08/2015 at 6:19 pm to Dr. Leonie Man, who verbally acknowledged these results. Electronically Signed   By: Franchot Gallo M.D.   On: 12/08/2015 18:19    Labs:  CBC:  Recent Labs  12/08/15 1755 12/08/15 1759 12/09/15 0430  WBC  --  15.4* 19.7*  HGB 18.0* 18.2* 14.3  HCT 53.0* 51.2 43.3  PLT  --  264 234    COAGS:  Recent Labs  12/08/15 1759  INR 0.99  APTT 29    BMP:  Recent Labs  12/08/15 1755 12/08/15 1759 12/09/15 0430  NA 140 138 140  K 3.8 3.9 3.8  CL 104 106 111  CO2  --  22 23  GLUCOSE 106* 108* 112*  BUN 14 13 8   CALCIUM  --  9.3 8.0*  CREATININE 0.70 0.90 0.71  GFRNONAA  --  >60 >60  GFRAA  --  >60 >60    LIVER FUNCTION TESTS:  Recent Labs  12/08/15 1759  BILITOT 0.3  AST 27  ALT 34  ALKPHOS 71  PROT 7.1  ALBUMIN 4.4    Assessment and Plan:  CVA/ L MCA occlusion Revascularization in IR 10/31 No response For sheath removal today Will follow  Electronically Signed: Trishelle Devora A 12/09/2015, 10:30 AM   I spent a total of 15 Minutes at the the patient's bedside AND on the patient's hospital floor or unit, greater than 50% of which was counseling/coordinating care for L MCA revasc

## 2015-12-09 NOTE — Progress Notes (Signed)
SLP Cancellation Note  Patient Details Name: James Cortez MRN: [id number] DOB: 03-Nov-1965   Cancelled treatment:       Reason Eval/Treat Not Completed: Medical issues which prohibited therapy (Intubated. Will f/u 11/2. )  Gabriel Rainwater [place], CCC-SLP [phone]  James Cortez 12/09/2015, 7:28 AM

## 2015-12-09 NOTE — Progress Notes (Signed)
PT Cancellation Note  Patient Details Name: Yosiah Hairr MRN: [id number] DOB: 1965/07/10   Cancelled Treatment:    Reason Eval/Treat Not Completed: Patient not medically ready; patient with bedrest orders.  Will attempt to see 11/2.   Reginia Naas 12/09/2015, 8:47 AM Magda Kiel, [place] 12/09/2015

## 2015-12-09 NOTE — Progress Notes (Signed)
STROKE TEAM PROGRESS NOTE   HISTORY OF PRESENT ILLNESS (per record) James Cortez is an 50 y.o. male with no known risk factors for stroke other than tobacco, presenting with acute onset of right facial droop, right hemiparesis and difficulty with speech. Patient was last known well at 5:23 PM today 12/08/2015 ([facility]). He has no history of stroke nor TIA. He has not been on antiplatelet therapy. CT scan of the head showed no acute  intracranial abnormality. CT angiogram showed acute occlusion of the M1 segment of the left middle cerebral artery. Patient was deemed a candidate for TPA. Initial NIH stroke score was 14. PA was administered and patient showed significant improvement. NIH stroke score went from 14->5. Patient was taken to interventional radiology Cerebral angiogram showed persistent occlusion of left MCA, M1 segment. Endovascular revascularization was elected. Had L MCA occlusion s/p TICI2b revascularization with solitaire, stent assisted angioplasty and IA tPA.  He was admitted to the neuro ICU for further evaluation and treatment.   SUBJECTIVE (INTERVAL HISTORY) Patient remains intubated. Blood pressure is adequately controlled. Patient's wife and daughter arrive after rounds for discussion. Patient's groin sheath is currently being removed   OBJECTIVE Temp:  [97.6 F (36.4 C)-98.2 F (36.8 C)] 98.2 F (36.8 C) (11/01 0400) Pulse Rate:  [56-83] 71 (11/01 0800) Cardiac Rhythm: Normal sinus rhythm (11/01 0800) Resp:  [13-27] 22 (11/01 0800) BP: (98-146)/(58-85) 127/69 (11/01 0800) SpO2:  [95 %-100 %] 100 % (11/01 0800) Arterial Line BP: (106-161)/(53-78) 161/72 (11/01 0800) FiO2 (%):  [40 %-100 %] 40 % (11/01 0720) Weight:  [74.5 kg (164 lb 3.9 oz)-77.5 kg (170 lb 13.7 oz)] 74.5 kg (164 lb 3.9 oz) (11/01 0008)  CBC:  Recent Labs Lab 12/08/15 1759 12/09/15 0430  WBC 15.4* 19.7*  NEUTROABS 9.1* 15.9*  HGB 18.2* 14.3  HCT 51.2 43.3  MCV 88.7 89.8  PLT 264 234    Basic  Metabolic Panel:  Recent Labs Lab 12/08/15 1759 12/09/15 0430  NA 138 140  K 3.9 3.8  CL 106 111  CO2 22 23  GLUCOSE 108* 112*  BUN 13 8  CREATININE 0.90 0.71  CALCIUM 9.3 8.0*    Lipid Panel:    Component Value Date/Time   CHOL 159 12/09/2015 0430   TRIG 255 (H) 12/09/2015 0430   TRIG 240 (H) 12/09/2015 0430   HDL 24 (L) 12/09/2015 0430   CHOLHDL 6.6 12/09/2015 0430   VLDL 48 (H) 12/09/2015 0430   LDLCALC 87 12/09/2015 0430   HgbA1c: No results found for: HGBA1C Urine Drug Screen: No results found for: LABOPIA, COCAINSCRNUR, LABBENZ, AMPHETMU, THCU, LABBARB    IMAGING  Ct Angio Head W Or Wo Contrast  Result Date: 12/08/2015 CLINICAL DATA:  Initial evaluation for acute right-sided weakness, right-sided facial droop. EXAM: CT ANGIOGRAPHY HEAD AND NECK TECHNIQUE: Multidetector CT imaging of the head and neck was performed using the standard protocol during bolus administration of intravenous contrast. Multiplanar CT image reconstructions and MIPs were obtained to evaluate the vascular anatomy. Carotid stenosis measurements (when applicable) are obtained utilizing NASCET criteria, using the distal internal carotid diameter as the denominator. CONTRAST:  50 cc of Isovue 370. COMPARISON:  Comparison made with prior noncontrast head CT performed earlier on the same day. FINDINGS: CTA NECK FINDINGS Aortic arch: Visualized aortic arch is of normal caliber with normal 3 vessel morphology. No high-grade stenosis at the origin of the great vessels. Visualized subclavian arteries are widely patent. Right carotid system: Right common carotid artery patent from  its origin to the bifurcation. No significant atheromatous plaque about the right bifurcation. Right ICA widely patent from the bifurcation to the skullbase. No stenosis, dissection, or vascular occlusion identified within the right carotid artery system. Right external carotid artery and its branches within normal limits. Left carotid  system: Left common carotid artery widely patent from its origin two the bifurcation. No significant atheromatous plaque about the left bifurcation. Left ICA widely patent from the bifurcation to the skullbase. No stenosis, dissection, or vascular occlusion within the left carotid artery system. Left external carotid artery and its branches within normal limits. Vertebral arteries: Both of the vertebral arteries arise from the subclavian arteries. Vertebral arteries widely patent within the neck without stenosis, dissection, or occlusion. Skeleton: No acute osseous abnormality. No worrisome lytic or blastic osseous lesions. Other neck: Visualized soft tissues of the neck within normal limits without acute abnormality. Thyroid normal. No adenopathy. Upper chest: Visualized upper mediastinum is normal. Visualized lungs are clear. Review of the MIP images confirms the above findings CTA HEAD FINDINGS Anterior circulation: The petrous, cavernous, and supraclinoid segments of the internal carotid arteries are widely patent without flow-limiting stenosis. ICA termini themselves appear to be patent. A1 segments patent. Right A1 segment hypoplastic. Anterior communicating artery normal. Anterior cerebral arch well opacified to their distal aspects. On the left, there is abrupt cough of the left M1 segment, which fever reflects high-grade stenosis or possibly embolic occlusion. Area of occlusion measures approximately 7 mm. There is reconstitution distally, with attenuated flow within the distal left M1 segment and left MCA branches. Right M1 segment widely patent. Right MCA bifurcation normal. Distal right MCA branches well opacified. Posterior circulation: Vertebral arteries are patent to the vertebrobasilar junction. Mild atheromatous irregularity within the left V4 segment without high-grade stenosis. Posterior inferior cerebral arteries patent bilaterally. Basilar artery widely patent. Superior cerebral arteries patent  bilaterally. Right PCA supplied via the basilar artery and is well opacified to its distal aspect. There is a fetal type left PCA supplied via a widely patent left posterior communicating artery. Venous sinuses: Grossly patent. Anatomic variants: Fetal type left PCA. No aneurysm or vascular malformation. Delayed phase: Not performed. Review of the MIP images confirms the above findings IMPRESSION: CTA HEAD IMPRESSION: 1. Abrupt occlusion of the proximal left M1 segment, with attenuated distal reconstitution. Finding may reflect a severe high-grade stenosis or possibly partially occlusive and/or recannulized thrombus. Area of occlusion measures approximately 7 mm in length. Per discussion with Dr. 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 This is not available for review or comparison at time of this dictation. While findings on this CT are felt to be within the M1 segment, it is possible that this lesion is the same lesion discussed on previous arteriogram from 2014. Direct comparison with previous images recommended if available. 2. Otherwise normal CTA of the head. CTA NECK IMPRESSION: Negative CTA of the neck. No flow-limiting or critical stenosis identified. Critical Value/emergent results were called by telephone at the time of interpretation on 12/08/2015 at 6:22 pm to Dr. Leonie Man , who verbally acknowledged these results. Electronically Signed   By: Jeannine Boga M.D.   On: 12/08/2015 19:05   Ct Head Wo Contrast  Result Date: 12/09/2015 CLINICAL DATA:  50 y/o  M; left M1 occlusion revascularization. EXAM: CT HEAD WITHOUT CONTRAST TECHNIQUE: Contiguous axial images were obtained from the base of the skull through the vertex  without intravenous contrast. COMPARISON:  12/08/2015 CT head. FINDINGS: Brain: Stable calcification in the left frontal periventricular white matter. No evidence for large  territory infarct, focal mass effect, or brain parenchymal hemorrhage. Increased density within sulci in the left parietal and lateral temporal lobe in comparison with the contralateral hemisphere is probably due to vessel engorgement and hyperemia post revascularization. Less likely subarachnoid hemorrhage. Vascular: Left M1 segment stent. Skull: Normal. Negative for fracture or focal lesion. Sinuses/Orbits: Moderate diffuse paranasal sinus mucosal thickening. Normal pneumatization of mastoid air cells. Orbits are unremarkable. Other: None. IMPRESSION: 1. No infarct or brain parenchymal hemorrhage identified. 2. Increased density within sulci in the left parietal and lateral temporal lobe in comparison with the contralateral hemisphere is probably due to vessel engorgement and hyperemia post revascularization, less likely subarachnoid hemorrhage. Electronically Signed   By: Kristine Garbe M.D.   On: 12/09/2015 00:03   Ct Angio Neck W And/or Wo Contrast  Result Date: 12/08/2015 CLINICAL DATA:  Initial evaluation for acute right-sided weakness, right-sided facial droop. EXAM: CT ANGIOGRAPHY HEAD AND NECK TECHNIQUE: Multidetector CT imaging of the head and neck was performed using the standard protocol during bolus administration of intravenous contrast. Multiplanar CT image reconstructions and MIPs were obtained to evaluate the vascular anatomy. Carotid stenosis measurements (when applicable) are obtained utilizing NASCET criteria, using the distal internal carotid diameter as the denominator. CONTRAST:  50 cc of Isovue 370. COMPARISON:  Comparison made with prior noncontrast head CT performed earlier on the same day. FINDINGS: CTA NECK FINDINGS Aortic arch: Visualized aortic arch is of normal caliber with normal 3 vessel morphology. No high-grade stenosis at the origin of the great vessels. Visualized subclavian arteries are widely patent. Right carotid system: Right common carotid artery patent from  its origin to the bifurcation. No significant atheromatous plaque about the right bifurcation. Right ICA widely patent from the bifurcation to the skullbase. No stenosis, dissection, or vascular occlusion identified within the right carotid artery system. Right external carotid artery and its branches within normal limits. Left carotid system: Left common carotid artery widely patent from its origin two the bifurcation. No significant atheromatous plaque about the left bifurcation. Left ICA widely patent from the bifurcation to the skullbase. No stenosis, dissection, or vascular occlusion within the left carotid artery system. Left external carotid artery and its branches within normal limits. Vertebral arteries: Both of the vertebral arteries arise from the subclavian arteries. Vertebral arteries widely patent within the neck without stenosis, dissection, or occlusion. Skeleton: No acute osseous abnormality. No worrisome lytic or blastic osseous lesions. Other neck: Visualized soft tissues of the neck within normal limits without acute abnormality. Thyroid normal. No adenopathy. Upper chest: Visualized upper mediastinum is normal. Visualized lungs are clear. Review of the MIP images confirms the above findings CTA HEAD FINDINGS Anterior circulation: The petrous, cavernous, and supraclinoid segments of the internal carotid arteries are widely patent without flow-limiting stenosis. ICA termini themselves appear to be patent. A1 segments patent. Right A1 segment hypoplastic. Anterior communicating artery normal. Anterior cerebral arch well opacified to their distal aspects. On the left, there is abrupt cough of the left M1 segment, which fever reflects high-grade stenosis or possibly embolic occlusion. Area of occlusion measures approximately 7 mm. There is reconstitution distally, with attenuated flow within the distal left M1 segment and left MCA branches. Right M1 segment widely patent. Right MCA bifurcation  normal. Distal right MCA branches well opacified. Posterior circulation: Vertebral arteries are patent to the vertebrobasilar junction. Mild atheromatous irregularity  within the left V4 segment without high-grade stenosis. Posterior inferior cerebral arteries patent bilaterally. Basilar artery widely patent. Superior cerebral arteries patent bilaterally. Right PCA supplied via the basilar artery and is well opacified to its distal aspect. There is a fetal type left PCA supplied via a widely patent left posterior communicating artery. Venous sinuses: Grossly patent. Anatomic variants: Fetal type left PCA. No aneurysm or vascular malformation. Delayed phase: Not performed. Review of the MIP images confirms the above findings IMPRESSION: CTA HEAD IMPRESSION: 1. Abrupt occlusion of the proximal left M1 segment, with attenuated distal reconstitution. Finding may reflect a severe high-grade stenosis or possibly partially occlusive and/or recannulized thrombus. Area of occlusion measures approximately 7 mm in length. Per discussion with Dr. 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 This is not available for review or comparison at time of this dictation. While findings on this CT are felt to be within the M1 segment, it is possible that this lesion is the same lesion discussed on previous arteriogram from 2014. Direct comparison with previous images recommended if available. 2. Otherwise normal CTA of the head. CTA NECK IMPRESSION: Negative CTA of the neck. No flow-limiting or critical stenosis identified. Critical Value/emergent results were called by telephone at the time of interpretation on 12/08/2015 at 6:22 pm to Dr. Leonie Man , who verbally acknowledged these results. Electronically Signed   By: Jeannine Boga M.D.   On: 12/08/2015 19:05   Portable Chest Xray  Result Date: 12/09/2015 CLINICAL DATA:  50 y/o   M; endotracheal tube placement. EXAM: PORTABLE CHEST 1 VIEW COMPARISON:  None. FINDINGS: Cardiac silhouette within normal limits. Endotracheal tube tip 5.5 cm from carina. Endotracheal tube tip below the field of the in the abdomen, proximal side hole near the gastroesophageal junction. Clear lungs. No pneumothorax or effusion. IMPRESSION: Endotracheal tube tip 5.5 cm from carina at lower clavicle level. Enteric tube tip in the abdomen with proximal side hole near the gastroesophageal junction, suggest advancement. Clear lung. Electronically Signed   By: Kristine Garbe M.D.   On: 12/09/2015 00:05   Dg Abd Portable 1v  Result Date: 12/09/2015 CLINICAL DATA:  50 y/o  M; nasogastric tube placement. EXAM: PORTABLE ABDOMEN - 1 VIEW COMPARISON:  None. FINDINGS: Normal bowel gas pattern. Enteric tube tip in proximal stomach with proximal side hole near the gastroesophageal junction, suggest advancement. Contrast accumulation in the kidneys from recent IV contrast administration. Mild degenerative changes of lumbar spine. IMPRESSION: Enteric tube tip in proximal stomach with proximal side hole near the gastroesophageal junction, suggest advancement. Electronically Signed   By: Kristine Garbe M.D.   On: 12/09/2015 00:06   Ct Head Code Stroke W/o Cm  Result Date: 12/08/2015 CLINICAL DATA:  Code stroke.  Right-sided weakness EXAM: CT HEAD WITHOUT CONTRAST TECHNIQUE: Contiguous axial images were obtained from the base of the skull through the vertex without intravenous contrast. COMPARISON:  CT head 03/11/2004 FINDINGS: Brain: Ventricle size normal. Cerebral volume normal. Negative for acute infarct. No significant chronic ischemic change. 5 mm dense calcification in the left anterior head of caudate unchanged from the prior study. Probably an area of chronic infection such as granulomatous disease or cysticercosis. No surrounding edema. No other calcifications. Vascular: No hyperdense vessel or  unexpected calcification. Skull: Negative Sinuses/Orbits: Mild mucosal edema in the paranasal sinuses. Mastoid sinus clear. Other: None ASPECTS ([place] Stroke Program Early CT Score) - Ganglionic level infarction (caudate,  lentiform nuclei, internal capsule, insula, M1-M3 cortex): 7 - Supraganglionic infarction (M4-M6 cortex): 3 Total score (0-10 with 10 being normal): 10 IMPRESSION: 1. Negative for acute infarct. 2. Stable 5 mm calcification left head of caudate likely due to chronic infection. 3. ASPECTS is 10 These results were called by telephone at the time of interpretation on 12/08/2015 at 6:19 pm to Dr. Leonie Man, who verbally acknowledged these results. Electronically Signed   By: Franchot Gallo M.D.   On: 12/08/2015 18:19   Cerebral Angiography 12/08/2015 S/P lt common carotid arteriogram,followed by by complete revascularization of occluded Lt MCA m1 , with x pass with 4 mm x 40 mm solitaire retrieval device and stent assisted angioplasty of severe underlying stenosis with reocclusion achieving a TICI 2 b revascularization Also received 8 mg of superselective IA tpa.   PHYSICAL EXAM Middle aged 26 male who is intubated and sedated. . Afebrile. Head is nontraumatic. Neck is supple without bruit.    Cardiac exam no murmur or gallop. Lungs are clear to auscultation. Distal pulses are well felt. Neurological Exam :  Intubated sedated. Eyes closed. Arouses to noxious stimuli. Eyes primary position. Pupils 4 mm reactive. Fundi not visualized.corneals present. Cough and gag present. Does not follow commands. Mild right lower facial weakness. Tongue midline. Patient can move left upper and lower extremity spontaneously and against gravity. Patient has withdrawal response in right upper extremity to pain and withdraws right lower extremity well to noxious stimuli. Right plantar upgoing left downgoing. Tone is diminished on the right compared to the left. Gait was not tested. ASSESSMENT/PLAN Mr.  James Cortez is a 49 y.o. male with no significant medical history presenting with right facial droop, right hemiparesis and difficulty with speech. He received IV t-PA 12/08/2015 at 1822. 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 Infarct secondary to large vessel disease source  Initial CT normal, ASPECTS 10  CTA head L M1 occlusion w/ distal reconstitution  CTA neck negative  Post IR CT no infarct or hmg. Increased density L parietal and lateral temporal lobe  CT head next am developing hypoattenuation posteior L leniform nucleus, CSV and watershed cortical infarcts without hmg. L MCA M1 stent beyond bifurcation  MRI  pending   2D Echo  pending   LDL 87  HgbA1c pending  SCDs for VTE prophylaxis  Diet NPO time specified  Diet NPO time specified  No antithrombotic prior to admission, now on aspirin 325 mg daily and clopidogrel 75 mg daily following a 300 mg plavix load  Ongoing aggressive stroke risk factor management  Therapy recommendations:  pending   Disposition:  pending   Acute respiratory failure  Intubated for neurointervention  CCM onboard  Blood Pressure  Goal per IR  No hx HTN, no home meds  Hyperlipidemia  Home meds:  No statin   LDL 87, goal < 70  Add statin once able to swallow or alternative feeding method in place  Other Stroke Risk Factors  Cigarette smoker, advised to stop smoking  ETOH use, advised to drink no more than 2 drink(s) a day  Other Active Problems  Leukocytosis 19.7, etiology unclear (CXR and UA ok)  Hospital day # [place] for Pager information 12/09/2015 2:47  PM  I have personally examined this patient, reviewed notes,  independently viewed imaging studies, participated in medical decision making and plan of care.ROS completed by me personally and pertinent positives fully documented  I have made any additions or clarifications directly to the above note. Agree with note above. . Patient presented with left middle cerebral artery occlusion and underwent endovascular mechanical embolectomy with left MCA angioplasty and stent placement for underlying stenosis. Plan to remove groin sheath today. Check MRI scan of the brain. Consider extubation later. Strict control of blood pressure. Aspirin and Plavix due to intracranial stent. Discuss with Dr. Milinda Hirschfeld and Estanislado Pandy. I also had a long discussion with the patient's wife and daughter who interpreted for patient's wife who speaks limited English about patient's prognosis, plan for treatment and answered questions. This patient is critically ill and at significant risk of neurological worsening, death and care requires constant monitoring of vital signs, hemodynamics,respiratory and cardiac monitoring, extensive review of multiple databases, frequent neurological assessment, discussion with family, other specialists and medical decision making of high complexity.I have made any additions or clarifications directly to the above note.This critical care time does not reflect procedure time, or teaching time or supervisory time of PA/NP/Med Resident etc but could involve care discussion time.  I spent 50 minutes of neurocritical care time  in the care of  this patient.      Antony Contras, MD Medical Director [facility] Pager: [phone] 12/09/2015 3:41 PM To contact Stroke Continuity provider, please refer to [email or web address] After hours, contact General Neurology

## 2015-12-09 NOTE — Progress Notes (Signed)
7Fr sheath removed from Rt groin at 1325.  V-pad and manual pressure applied for 45 minutes.  Hemostasis at 1410.  Groin site soft. No hematoma noted.  Pressure dressing applied after groin reviewed with RN.  Distal pulses 1+.

## 2015-12-09 NOTE — Progress Notes (Signed)
OT Cancellation Note  Patient Details Name: James Cortez MRN: [id number] DOB: 1965/10/12   Cancelled Treatment:    Reason Eval/Treat Not Completed: Patient not medically ready (active bedrest orders).  Binnie Kand M.S., OTR/L Pager: [phone]  12/09/2015, 7:35 AM

## 2015-12-09 NOTE — Progress Notes (Signed)
Orthopedic Tech Progress Note Patient Details:  James Cortez 07-29-1965 [id number]  Ortho Devices Type of Ortho Device: Other (comment) Ortho Device/Splint Location: 10 lbs  Ortho Device/Splint Interventions: Ordered, Application Dr ordered 10 lbs wt to put pressure on wound.  Karolee Stamps 12/09/2015, 2:25 AM

## 2015-12-09 NOTE — Progress Notes (Signed)
Patient transported to CT and back to room 3M10 without complications.   

## 2015-12-09 NOTE — Progress Notes (Signed)
Initial Nutrition Assessment  DOCUMENTATION CODES:   Not applicable  INTERVENTION:   - Recommend Vital High Protein @ 50 mL/hr (1200 mL) to provide 1200 kcal, 105 grams protein, and 1008 mL free water daily. - TF regimen with propofol provides 1812 kcal (96% estimated energy needs), 105 grams protein (100% estimated protein needs), and 1008 mL free water daily.  NUTRITION DIAGNOSIS:   Inadequate oral intake related to inability to eat as evidenced by NPO status.  GOAL:   Patient will meet greater than or equal to 90% of their needs  MONITOR:   Vent status, Labs, Weight trends  REASON FOR ASSESSMENT:   Ventilator   ASSESSMENT:   50 year old male with no significant past medical history. He presented to [facility] October 31 after developing acute onset right sided facial droop and right-sided hemiparesis with associated aphasia. He was brought to the emergency department as a code stroke; CT angiogram of the head showed acute occlusion of the M1 segment of the left MCA. He was given systemic TPA and was taken to interventional radiology for revascularization, which was successful. Post procedurally he was taken to the ICU for recovery where he remains on the ventilator.  Noted MD's note to hold TF for now. Will provide TF recommendations if pt is to remain intubated. Spoke with RN who reports extubation likely to occur tomorrow.  Pt is currently intubated on ventilator support. MV: 13.2 Lmin Highest temperature in 24 hours: 36.8 degrees Celsius  Propofol infusing at 23.4 mL/hr which provides 618 kcal daily.  Per imaging note, pt's OG tube with tip in distal stomach/pyloric region.  Medications reviewed and include 75 mg Plavix, 20 mg Pepcid, PRN Zofran  Labs reviewed and include low calcium (8.0 mg/dL), elevated triglycerides (240 mg/dL), low HDL (24 mg/dL) CBG: 111 mg/dL  NFPE: Exam completed. No fat depletion, mild muscle depletion, and no edema noted.  Diet  Order:  Diet NPO time specified Diet NPO time specified  Skin:  Reviewed, no issues  Last BM:  PTA  Height:   Ht Readings from Last 1 Encounters:  12/09/15 5\' 10"  (1.778 m)    Weight:   Wt Readings from Last 1 Encounters:  12/09/15 164 lb 3.9 oz (74.5 kg)    Ideal Body Weight:  75.5 kg  BMI:  Body mass index is 23.57 kg/m.  Estimated Nutritional Needs:   Kcal:  1894  Protein:  97-112 grams (1.3-1.5 g/kg)  Fluid:  2.0 L/day  EDUCATION NEEDS:   No education needs identified at this time  Jeb Levering Dietetic Intern Pager Number: [phone]

## 2015-12-09 NOTE — Progress Notes (Signed)
ETT advanced 2cm per MD order.  Patient tolerated well.  Currently at 25 at the lip.  Will continue to monitor.

## 2015-12-09 NOTE — Progress Notes (Signed)
PULMONARY / CRITICAL CARE MEDICINE   Name: James Cortez MRN: [id number] DOB: 1965/08/02    ADMISSION DATE:  12/08/2015 CONSULTATION DATE:  12/08/2015  REFERRING MD:  Dr. Nicole Kindred  CHIEF COMPLAINT:  Stroke  HISTORY OF PRESENT ILLNESS:   50 year old male with no significant past medical history. He presented to [facility] October 31 after developing acute onset right sided facial droop and right-sided hemiparesis with associated aphasia while at his daughter's school. He was brought to the emergency department as a code stroke or CT angiogram of the head showed acute occlusion of the M1 segment of the left MCA. He was given systemic TPA and was taken to interventional radiology for revascularization, which was successful. Post procedurally he was taken to the ICU for recovery where he remains on the ventilator. PCCM consult and for vent/medical management.  SUBJECTIVE:  No acute events overnight. Nurse reports minimal cooperation on physical exam with lightening of sedation.  REVIEW OF SYSTEMS:  Unable to obtain given intubation & sedation.   VITAL SIGNS: BP 127/69   Pulse 71   Temp 97.6 F (36.4 C) (Axillary)   Resp (!) 22   Ht 5\' 10"  (1.778 m)   Wt 164 lb 3.9 oz (74.5 kg)   SpO2 100%   BMI 23.57 kg/m   HEMODYNAMICS:    VENTILATOR SETTINGS: Vent Mode: PRVC FiO2 (%):  [40 %-100 %] 40 % Set Rate:  [14 bmp-22 bmp] 22 bmp Vt Set:  [580 mL] 580 mL PEEP:  [5 cmH20] 5 cmH20 Plateau Pressure:  [16 cmH20-18 cmH20] 18 cmH20  INTAKE / OUTPUT: I/O last 3 completed shifts: In: 3586.4 [I.V.:3586.4] Out: [id number] [Urine:1725; Blood:50]  PHYSICAL EXAMINATION: General:  Sedated. No acute distress. No family at bedside.  Integument:  Warm & dry. No rash on exposed skin. HEENT:  No scleral injection or icterus. Endotracheal tube in place.  Cardiovascular:  Regular rate. No edema. No appreciable JVD.  Pulmonary:  Clear on auscultation. Symmetric chest wall rise on  ventilator. Abdomen: Soft. Normal bowel sounds. Nondistended. Musculoskeletal:  No joint deformity or effusion appreciated. Neurological: Patient sedated. Pupils symmetric and reactive. No spontaneous movements of extremities.  LABS:  BMET  Recent Labs Lab 12/08/15 1755 12/08/15 1759 12/09/15 0430  NA 140 138 140  K 3.8 3.9 3.8  CL 104 106 111  CO2  --  22 23  BUN 14 13 8   CREATININE 0.70 0.90 0.71  GLUCOSE 106* 108* 112*    Electrolytes  Recent Labs Lab 12/08/15 1759 12/09/15 0430  CALCIUM 9.3 8.0*    CBC  Recent Labs Lab 12/08/15 1755 12/08/15 1759 12/09/15 0430  WBC  --  15.4* 19.7*  HGB 18.0* 18.2* 14.3  HCT 53.0* 51.2 43.3  PLT  --  264 234    Coag's  Recent Labs Lab 12/08/15 1759  APTT 29  INR 0.99    Sepsis Markers No results for input(s): LATICACIDVEN, PROCALCITON, O2SATVEN in the last 168 hours.  ABG  Recent Labs Lab 12/09/15 0028  PHART 7.260*  PCO2ART 48.0  PO2ART 354.0*    Liver Enzymes  Recent Labs Lab 12/08/15 1759  AST 27  ALT 34  ALKPHOS 71  BILITOT 0.3  ALBUMIN 4.4    Cardiac Enzymes No results for input(s): TROPONINI, PROBNP in the last 168 hours.  Glucose  Recent Labs Lab 12/08/15 1750  GLUCAP 111*    Imaging Ct Angio Head W Or Wo Contrast  Result Date: 12/08/2015 CLINICAL DATA:  Initial evaluation for acute  right-sided weakness, right-sided facial droop. EXAM: CT ANGIOGRAPHY HEAD AND NECK TECHNIQUE: Multidetector CT imaging of the head and neck was performed using the standard protocol during bolus administration of intravenous contrast. Multiplanar CT image reconstructions and MIPs were obtained to evaluate the vascular anatomy. Carotid stenosis measurements (when applicable) are obtained utilizing NASCET criteria, using the distal internal carotid diameter as the denominator. CONTRAST:  50 cc of Isovue 370. COMPARISON:  Comparison made with prior noncontrast head CT performed earlier on the same day.  FINDINGS: CTA NECK FINDINGS Aortic arch: Visualized aortic arch is of normal caliber with normal 3 vessel morphology. No high-grade stenosis at the origin of the great vessels. Visualized subclavian arteries are widely patent. Right carotid system: Right common carotid artery patent from its origin to the bifurcation. No significant atheromatous plaque about the right bifurcation. Right ICA widely patent from the bifurcation to the skullbase. No stenosis, dissection, or vascular occlusion identified within the right carotid artery system. Right external carotid artery and its branches within normal limits. Left carotid system: Left common carotid artery widely patent from its origin two the bifurcation. No significant atheromatous plaque about the left bifurcation. Left ICA widely patent from the bifurcation to the skullbase. No stenosis, dissection, or vascular occlusion within the left carotid artery system. Left external carotid artery and its branches within normal limits. Vertebral arteries: Both of the vertebral arteries arise from the subclavian arteries. Vertebral arteries widely patent within the neck without stenosis, dissection, or occlusion. Skeleton: No acute osseous abnormality. No worrisome lytic or blastic osseous lesions. Other neck: Visualized soft tissues of the neck within normal limits without acute abnormality. Thyroid normal. No adenopathy. Upper chest: Visualized upper mediastinum is normal. Visualized lungs are clear. Review of the MIP images confirms the above findings CTA HEAD FINDINGS Anterior circulation: The petrous, cavernous, and supraclinoid segments of the internal carotid arteries are widely patent without flow-limiting stenosis. ICA termini themselves appear to be patent. A1 segments patent. Right A1 segment hypoplastic. Anterior communicating artery normal. Anterior cerebral arch well opacified to their distal aspects. On the left, there is abrupt cough of the left M1 segment,  which fever reflects high-grade stenosis or possibly embolic occlusion. Area of occlusion measures approximately 7 mm. There is reconstitution distally, with attenuated flow within the distal left M1 segment and left MCA branches. Right M1 segment widely patent. Right MCA bifurcation normal. Distal right MCA branches well opacified. Posterior circulation: Vertebral arteries are patent to the vertebrobasilar junction. Mild atheromatous irregularity within the left V4 segment without high-grade stenosis. Posterior inferior cerebral arteries patent bilaterally. Basilar artery widely patent. Superior cerebral arteries patent bilaterally. Right PCA supplied via the basilar artery and is well opacified to its distal aspect. There is a fetal type left PCA supplied via a widely patent left posterior communicating artery. Venous sinuses: Grossly patent. Anatomic variants: Fetal type left PCA. No aneurysm or vascular malformation. Delayed phase: Not performed. Review of the MIP images confirms the above findings IMPRESSION: CTA HEAD IMPRESSION: 1. Abrupt occlusion of the proximal left M1 segment, with attenuated distal reconstitution. Finding may reflect a severe high-grade stenosis or possibly partially occlusive and/or recannulized thrombus. Area of occlusion measures approximately 7 mm in length. Per discussion with Dr. 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 This is not available for review or comparison at time of this dictation. While findings on this CT are felt to be within  the M1 segment, it is possible that this lesion is the same lesion discussed on previous arteriogram from 2014. Direct comparison with previous images recommended if available. 2. Otherwise normal CTA of the head. CTA NECK IMPRESSION: Negative CTA of the neck. No flow-limiting or critical stenosis identified. Critical Value/emergent results were  called by telephone at the time of interpretation on 12/08/2015 at 6:22 pm to Dr. Leonie Man , who verbally acknowledged these results. Electronically Signed   By: Jeannine Boga M.D.   On: 12/08/2015 19:05   Ct Head Wo Contrast  Result Date: 12/09/2015 CLINICAL DATA:  50 y/o  M; left M1 occlusion revascularization. EXAM: CT HEAD WITHOUT CONTRAST TECHNIQUE: Contiguous axial images were obtained from the base of the skull through the vertex without intravenous contrast. COMPARISON:  12/08/2015 CT head. FINDINGS: Brain: Stable calcification in the left frontal periventricular white matter. No evidence for large territory infarct, focal mass effect, or brain parenchymal hemorrhage. Increased density within sulci in the left parietal and lateral temporal lobe in comparison with the contralateral hemisphere is probably due to vessel engorgement and hyperemia post revascularization. Less likely subarachnoid hemorrhage. Vascular: Left M1 segment stent. Skull: Normal. Negative for fracture or focal lesion. Sinuses/Orbits: Moderate diffuse paranasal sinus mucosal thickening. Normal pneumatization of mastoid air cells. Orbits are unremarkable. Other: None. IMPRESSION: 1. No infarct or brain parenchymal hemorrhage identified. 2. Increased density within sulci in the left parietal and lateral temporal lobe in comparison with the contralateral hemisphere is probably due to vessel engorgement and hyperemia post revascularization, less likely subarachnoid hemorrhage. Electronically Signed   By: Kristine Garbe M.D.   On: 12/09/2015 00:03   Ct Angio Neck W And/or Wo Contrast  Result Date: 12/08/2015 CLINICAL DATA:  Initial evaluation for acute right-sided weakness, right-sided facial droop. EXAM: CT ANGIOGRAPHY HEAD AND NECK TECHNIQUE: Multidetector CT imaging of the head and neck was performed using the standard protocol during bolus administration of intravenous contrast. Multiplanar CT image reconstructions  and MIPs were obtained to evaluate the vascular anatomy. Carotid stenosis measurements (when applicable) are obtained utilizing NASCET criteria, using the distal internal carotid diameter as the denominator. CONTRAST:  50 cc of Isovue 370. COMPARISON:  Comparison made with prior noncontrast head CT performed earlier on the same day. FINDINGS: CTA NECK FINDINGS Aortic arch: Visualized aortic arch is of normal caliber with normal 3 vessel morphology. No high-grade stenosis at the origin of the great vessels. Visualized subclavian arteries are widely patent. Right carotid system: Right common carotid artery patent from its origin to the bifurcation. No significant atheromatous plaque about the right bifurcation. Right ICA widely patent from the bifurcation to the skullbase. No stenosis, dissection, or vascular occlusion identified within the right carotid artery system. Right external carotid artery and its branches within normal limits. Left carotid system: Left common carotid artery widely patent from its origin two the bifurcation. No significant atheromatous plaque about the left bifurcation. Left ICA widely patent from the bifurcation to the skullbase. No stenosis, dissection, or vascular occlusion within the left carotid artery system. Left external carotid artery and its branches within normal limits. Vertebral arteries: Both of the vertebral arteries arise from the subclavian arteries. Vertebral arteries widely patent within the neck without stenosis, dissection, or occlusion. Skeleton: No acute osseous abnormality. No worrisome lytic or blastic osseous lesions. Other neck: Visualized soft tissues of the neck within normal limits without acute abnormality. Thyroid normal. No adenopathy. Upper chest: Visualized upper mediastinum is normal. Visualized lungs are clear. Review of the  MIP images confirms the above findings CTA HEAD FINDINGS Anterior circulation: The petrous, cavernous, and supraclinoid segments of  the internal carotid arteries are widely patent without flow-limiting stenosis. ICA termini themselves appear to be patent. A1 segments patent. Right A1 segment hypoplastic. Anterior communicating artery normal. Anterior cerebral arch well opacified to their distal aspects. On the left, there is abrupt cough of the left M1 segment, which fever reflects high-grade stenosis or possibly embolic occlusion. Area of occlusion measures approximately 7 mm. There is reconstitution distally, with attenuated flow within the distal left M1 segment and left MCA branches. Right M1 segment widely patent. Right MCA bifurcation normal. Distal right MCA branches well opacified. Posterior circulation: Vertebral arteries are patent to the vertebrobasilar junction. Mild atheromatous irregularity within the left V4 segment without high-grade stenosis. Posterior inferior cerebral arteries patent bilaterally. Basilar artery widely patent. Superior cerebral arteries patent bilaterally. Right PCA supplied via the basilar artery and is well opacified to its distal aspect. There is a fetal type left PCA supplied via a widely patent left posterior communicating artery. Venous sinuses: Grossly patent. Anatomic variants: Fetal type left PCA. No aneurysm or vascular malformation. Delayed phase: Not performed. Review of the MIP images confirms the above findings IMPRESSION: CTA HEAD IMPRESSION: 1. Abrupt occlusion of the proximal left M1 segment, with attenuated distal reconstitution. Finding may reflect a severe high-grade stenosis or possibly partially occlusive and/or recannulized thrombus. Area of occlusion measures approximately 7 mm in length. Per discussion with Dr. 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 This is not available for review or comparison at time of this dictation. While findings on this CT are felt to be within the M1  segment, it is possible that this lesion is the same lesion discussed on previous arteriogram from 2014. Direct comparison with previous images recommended if available. 2. Otherwise normal CTA of the head. CTA NECK IMPRESSION: Negative CTA of the neck. No flow-limiting or critical stenosis identified. Critical Value/emergent results were called by telephone at the time of interpretation on 12/08/2015 at 6:22 pm to Dr. Leonie Man , who verbally acknowledged these results. Electronically Signed   By: Jeannine Boga M.D.   On: 12/08/2015 19:05   Portable Chest Xray  Result Date: 12/09/2015 CLINICAL DATA:  50 y/o  M; endotracheal tube placement. EXAM: PORTABLE CHEST 1 VIEW COMPARISON:  None. FINDINGS: Cardiac silhouette within normal limits. Endotracheal tube tip 5.5 cm from carina. Endotracheal tube tip below the field of the in the abdomen, proximal side hole near the gastroesophageal junction. Clear lungs. No pneumothorax or effusion. IMPRESSION: Endotracheal tube tip 5.5 cm from carina at lower clavicle level. Enteric tube tip in the abdomen with proximal side hole near the gastroesophageal junction, suggest advancement. Clear lung. Electronically Signed   By: Kristine Garbe M.D.   On: 12/09/2015 00:05   Dg Abd Portable 1v  Result Date: 12/09/2015 CLINICAL DATA:  50 y/o  M; nasogastric tube placement. EXAM: PORTABLE ABDOMEN - 1 VIEW COMPARISON:  None. FINDINGS: Normal bowel gas pattern. Enteric tube tip in proximal stomach with proximal side hole near the gastroesophageal junction, suggest advancement. Contrast accumulation in the kidneys from recent IV contrast administration. Mild degenerative changes of lumbar spine. IMPRESSION: Enteric tube tip in proximal stomach with proximal side hole near the gastroesophageal junction, suggest advancement. Electronically Signed   By: Kristine Garbe M.D.   On: 12/09/2015 00:06   Ct Head Code  Stroke W/o Cm  Result Date: 12/08/2015 CLINICAL  DATA:  Code stroke.  Right-sided weakness EXAM: CT HEAD WITHOUT CONTRAST TECHNIQUE: Contiguous axial images were obtained from the base of the skull through the vertex without intravenous contrast. COMPARISON:  CT head 03/11/2004 FINDINGS: Brain: Ventricle size normal. Cerebral volume normal. Negative for acute infarct. No significant chronic ischemic change. 5 mm dense calcification in the left anterior head of caudate unchanged from the prior study. Probably an area of chronic infection such as granulomatous disease or cysticercosis. No surrounding edema. No other calcifications. Vascular: No hyperdense vessel or unexpected calcification. Skull: Negative Sinuses/Orbits: Mild mucosal edema in the paranasal sinuses. Mastoid sinus clear. Other: None ASPECTS (Kosse Stroke Program Early CT Score) - Ganglionic level infarction (caudate, lentiform nuclei, internal capsule, insula, M1-M3 cortex): 7 - Supraganglionic infarction (M4-M6 cortex): 3 Total score (0-10 with 10 being normal): 10 IMPRESSION: 1. Negative for acute infarct. 2. Stable 5 mm calcification left head of caudate likely due to chronic infection. 3. ASPECTS is 10 These results were called by telephone at the time of interpretation on 12/08/2015 at 6:19 pm to Dr. Leonie Man, who verbally acknowledged these results. Electronically Signed   By: Franchot Gallo M.D.   On: 12/08/2015 18:19     STUDIES:  CT head 10/31:  Negative for acute infarct. 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 Sedation on Ventilator  P:   Neurology/Neuro IR management post stroke RASS goal: 0 to -1 Propofol drip for Sedation Fentanyl IV prn Pain/Discomfort PT/OT/Speech Therapy Consulted MRI pending & Head CT Read pending  CARDIOVASCULAR A:  Hypertension - New diagnosis post procedure. Intracranial Arterial Stent - Placed 10/31.  P:  Continuous telemetry monitoring Vitals per unit protocol SBP Goal per Neuro IR:  120-140 Cardene drip to maintain SBP Goal ASA 325mg  & Plavix 75mg  daily  PULMONARY A: Acute Respiratory Failure - Unable to protect airway post procedure.  P:   Full vent support Intermittent ABG & Portable CXR Advancing ETT 2cm based on Port CXR Daily SBT & Sedation Vacation Extubation once respiratory status & Neurological status improve  RENAL A:   No acute issues.  P:   Trending UOP with Foley Monitoring electrolytes & renal function daily Replacing electrolytes as indicated  GASTROINTESTINAL A:   No acute issues.  P:   NPO  Pepcid IV q12hr Holding on tube feedings for now  HEMATOLOGIC A:   Leukocytosis - Unclear etiology.  P:  Trending cell counts daily w/ CBC SCDs  INFECTIOUS A:   No evidence of acute infection.  P:   Monitoring off antibiotics. Monitoring for signs of infection.  ENDOCRINE A:   No acute issues.  P:   Follow glucose on daily labs.   FAMILY  - Updates:  No family at bedside 11/1.  - Inter-disciplinary family meet or Palliative Care meeting due by:  11/6   TODAY'S SUMMARY:  50 y.o. male suffered acute stroke  with limited past medical history. Patient was treated with intravenous TPA and subsequently taken to interventional radiology for revascularization with stent placement into left middle cerebral artery. Patient continues to have right femoral arterial sheath in place. Post stroke management per neurology and interventional radiology.  Remains unabated for today with hopeful extubation tomorrow morning. Requiring minimal vent settings.  I have spent a total of 37 minutes of critical care time today caring for the patient and reviewing the patient's electronic medical record as well as discussing plan of care with Neurology.  Sonia Baller Ashok Cordia, M.D. [facility] Pulmonary & Critical Care Pager:  [phone] After 3pm or if no response, call [phone] 12/09/2015 9:20 AM

## 2015-12-09 NOTE — Anesthesia Postprocedure Evaluation (Signed)
Anesthesia Post Note  Patient: James Cortez  Procedure(s) Performed: Procedure(s) (LRB): RADIOLOGY WITH ANESTHESIA - CODE STROKE (Right)  Patient location during evaluation: PACU Anesthesia Type: General Level of consciousness: sedated and patient remains intubated per anesthesia plan Pain management: pain level controlled Vital Signs Assessment: post-procedure vital signs reviewed and stable Respiratory status: patient remains intubated per anesthesia plan Cardiovascular status: stable Anesthetic complications: no    Last Vitals:  Vitals:   12/09/15 1800 12/09/15 1900  BP: 128/76 122/72  Pulse: 67 69  Resp: (!) 22 (!) 22  Temp:      Last Pain:  Vitals:   12/09/15 1600  TempSrc: Axillary  PainSc:                  James Cortez

## 2015-12-10 ENCOUNTER — Inpatient Hospital Stay (HOSPITAL_COMMUNITY): Payer: Self-pay

## 2015-12-10 DIAGNOSIS — I63512 Cerebral infarction due to unspecified occlusion or stenosis of left middle cerebral artery: Secondary | ICD-10-CM

## 2015-12-10 DIAGNOSIS — G8191 Hemiplegia, unspecified affecting right dominant side: Secondary | ICD-10-CM

## 2015-12-10 DIAGNOSIS — I6789 Other cerebrovascular disease: Secondary | ICD-10-CM

## 2015-12-10 DIAGNOSIS — I69320 Aphasia following cerebral infarction: Secondary | ICD-10-CM

## 2015-12-10 LAB — CBC WITH DIFFERENTIAL/PLATELET
Basophils Absolute: 0 10*3/uL (ref 0.0–0.1)
Basophils Relative: 0 %
Eosinophils Absolute: 0.1 10*3/uL (ref 0.0–0.7)
Eosinophils Relative: 0 %
HCT: 40.4 % (ref 39.0–52.0)
HEMOGLOBIN: 13.7 g/dL (ref 13.0–17.0)
LYMPHS ABS: 3.7 10*3/uL (ref 0.7–4.0)
LYMPHS PCT: 23 %
MCH: 30 pg (ref 26.0–34.0)
MCHC: 33.9 g/dL (ref 30.0–36.0)
MCV: 88.6 fL (ref 78.0–100.0)
Monocytes Absolute: 1.1 10*3/uL — ABNORMAL HIGH (ref 0.1–1.0)
Monocytes Relative: 7 %
NEUTROS ABS: 11.6 10*3/uL — AB (ref 1.7–7.7)
NEUTROS PCT: 70 %
Platelets: 229 10*3/uL (ref 150–400)
RBC: 4.56 MIL/uL (ref 4.22–5.81)
RDW: 13.3 % (ref 11.5–15.5)
WBC: 16.4 10*3/uL — AB (ref 4.0–10.5)

## 2015-12-10 LAB — RENAL FUNCTION PANEL
ANION GAP: 7 (ref 5–15)
Albumin: 3 g/dL — ABNORMAL LOW (ref 3.5–5.0)
BUN: 6 mg/dL (ref 6–20)
CHLORIDE: 112 mmol/L — AB (ref 101–111)
CO2: 22 mmol/L (ref 22–32)
Calcium: 8.5 mg/dL — ABNORMAL LOW (ref 8.9–10.3)
Creatinine, Ser: 0.64 mg/dL (ref 0.61–1.24)
Glucose, Bld: 105 mg/dL — ABNORMAL HIGH (ref 65–99)
POTASSIUM: 3.4 mmol/L — AB (ref 3.5–5.1)
Phosphorus: 2.7 mg/dL (ref 2.5–4.6)
Sodium: 141 mmol/L (ref 135–145)

## 2015-12-10 LAB — TRIGLYCERIDES: Triglycerides: 170 mg/dL — ABNORMAL HIGH (ref <150)

## 2015-12-10 LAB — ECHOCARDIOGRAM COMPLETE
HEIGHTINCHES: 70 in
WEIGHTICAEL: 2627.88 [oz_av]

## 2015-12-10 LAB — PROCALCITONIN

## 2015-12-10 LAB — HEMOGLOBIN A1C
Hgb A1c MFr Bld: 5.6 % (ref 4.8–5.6)
MEAN PLASMA GLUCOSE: 114 mg/dL

## 2015-12-10 LAB — MAGNESIUM: Magnesium: 2 mg/dL (ref 1.7–2.4)

## 2015-12-10 MED ORDER — ASPIRIN 300 MG RE SUPP
300.0000 mg | Freq: Every day | RECTAL | Status: DC
  Administered 2015-12-10 – 2015-12-13 (×4): 300 mg via RECTAL
  Filled 2015-12-10 (×4): qty 1

## 2015-12-10 MED ORDER — POTASSIUM CHLORIDE 10 MEQ/100ML IV SOLN
10.0000 meq | INTRAVENOUS | Status: AC
  Administered 2015-12-10 (×3): 10 meq via INTRAVENOUS
  Filled 2015-12-10 (×3): qty 100

## 2015-12-10 NOTE — Progress Notes (Signed)
STROKE TEAM PROGRESS NOTE   HISTORY OF PRESENT ILLNESS (per record) James Cortez is an 50 y.o. male with no known risk factors for stroke other than tobacco, presenting with acute onset of right facial droop, right hemiparesis and difficulty with speech. Patient was last known well at 5:23 PM today 12/08/2015 ([facility]). He has no history of stroke nor TIA. He has not been on antiplatelet therapy. CT scan of the head showed no acute  intracranial abnormality. CT angiogram showed acute occlusion of the M1 segment of the left middle cerebral artery. Patient was deemed a candidate for TPA. Initial NIH stroke score was 14. PA was administered and patient showed significant improvement. NIH stroke score went from 14->5. Patient was taken to interventional radiology Cerebral angiogram showed persistent occlusion of left MCA, M1 segment. Endovascular revascularization was elected. Had L MCA occlusion s/p TICI2b revascularization with solitaire, stent assisted angioplasty and IA tPA.  He was admitted to the neuro ICU for further evaluation and treatment.   SUBJECTIVE (INTERVAL HISTORY) Patient self extubated this am. Blood pressure is adequately controlled.  He is struggling with oral secretions OBJECTIVE Temp:  [98.2 F (36.8 C)-99.6 F (37.6 C)] 98.9 F (37.2 C) (11/02 1231) Pulse Rate:  [59-88] 69 (11/02 1300) Cardiac Rhythm: Normal sinus rhythm (11/02 0700) Resp:  [16-26] 26 (11/02 1300) BP: (120-174)/(66-87) 121/68 (11/02 1300) SpO2:  [98 %-100 %] 100 % (11/02 1300) Arterial Line BP: (83-189)/(59-87) 174/72 (11/02 0500) FiO2 (%):  [40 %] 40 % (11/02 0400)  CBC:   Recent Labs Lab 12/09/15 0430 12/10/15 0500  WBC 19.7* 16.4*  NEUTROABS 15.9* 11.6*  HGB 14.3 13.7  HCT 43.3 40.4  MCV 89.8 88.6  PLT 234 [id number]    Basic Metabolic Panel:   Recent Labs Lab 12/09/15 0430 12/10/15 0500  NA 140 141  K 3.8 3.4*  CL 111 112*  CO2 23 22  GLUCOSE 112* 105*  BUN 8 6  CREATININE 0.71 0.64  CALCIUM  8.0* 8.5*  MG  --  2.0  PHOS  --  2.7    Lipid Panel:     Component Value Date/Time   CHOL 159 12/09/2015 0430   TRIG 170 (H) 12/10/2015 0500   HDL 24 (L) 12/09/2015 0430   CHOLHDL 6.6 12/09/2015 0430   VLDL 48 (H) 12/09/2015 0430   LDLCALC 87 12/09/2015 0430   HgbA1c:  Lab Results  Component Value Date   HGBA1C 5.6 12/09/2015   Urine Drug Screen: No results found for: LABOPIA, COCAINSCRNUR, LABBENZ, AMPHETMU, THCU, LABBARB    IMAGING  Ct Angio Head W Or Wo Contrast  Result Date: 12/08/2015 CLINICAL DATA:  Initial evaluation for acute right-sided weakness, right-sided facial droop. EXAM: CT ANGIOGRAPHY HEAD AND NECK TECHNIQUE: Multidetector CT imaging of the head and neck was performed using the standard protocol during bolus administration of intravenous contrast. Multiplanar CT image reconstructions and MIPs were obtained to evaluate the vascular anatomy. Carotid stenosis measurements (when applicable) are obtained utilizing NASCET criteria, using the distal internal carotid diameter as the denominator. CONTRAST:  50 cc of Isovue 370. COMPARISON:  Comparison made with prior noncontrast head CT performed earlier on the same day. FINDINGS: CTA NECK FINDINGS Aortic arch: Visualized aortic arch is of normal caliber with normal 3 vessel morphology. No high-grade stenosis at the origin of the great vessels. Visualized subclavian arteries are widely patent. Right carotid system: Right common carotid artery patent from its origin to the bifurcation. No significant atheromatous plaque about the right bifurcation. Right  ICA widely patent from the bifurcation to the skullbase. No stenosis, dissection, or vascular occlusion identified within the right carotid artery system. Right external carotid artery and its branches within normal limits. Left carotid system: Left common carotid artery widely patent from its origin two the bifurcation. No significant atheromatous plaque about the left  bifurcation. Left ICA widely patent from the bifurcation to the skullbase. No stenosis, dissection, or vascular occlusion within the left carotid artery system. Left external carotid artery and its branches within normal limits. Vertebral arteries: Both of the vertebral arteries arise from the subclavian arteries. Vertebral arteries widely patent within the neck without stenosis, dissection, or occlusion. Skeleton: No acute osseous abnormality. No worrisome lytic or blastic osseous lesions. Other neck: Visualized soft tissues of the neck within normal limits without acute abnormality. Thyroid normal. No adenopathy. Upper chest: Visualized upper mediastinum is normal. Visualized lungs are clear. Review of the MIP images confirms the above findings CTA HEAD FINDINGS Anterior circulation: The petrous, cavernous, and supraclinoid segments of the internal carotid arteries are widely patent without flow-limiting stenosis. ICA termini themselves appear to be patent. A1 segments patent. Right A1 segment hypoplastic. Anterior communicating artery normal. Anterior cerebral arch well opacified to their distal aspects. On the left, there is abrupt cough of the left M1 segment, which fever reflects high-grade stenosis or possibly embolic occlusion. Area of occlusion measures approximately 7 mm. There is reconstitution distally, with attenuated flow within the distal left M1 segment and left MCA branches. Right M1 segment widely patent. Right MCA bifurcation normal. Distal right MCA branches well opacified. Posterior circulation: Vertebral arteries are patent to the vertebrobasilar junction. Mild atheromatous irregularity within the left V4 segment without high-grade stenosis. Posterior inferior cerebral arteries patent bilaterally. Basilar artery widely patent. Superior cerebral arteries patent bilaterally. Right PCA supplied via the basilar artery and is well opacified to its distal aspect. There is a fetal type left PCA  supplied via a widely patent left posterior communicating artery. Venous sinuses: Grossly patent. Anatomic variants: Fetal type left PCA. No aneurysm or vascular malformation. Delayed phase: Not performed. Review of the MIP images confirms the above findings IMPRESSION: CTA HEAD IMPRESSION: 1. Abrupt occlusion of the proximal left M1 segment, with attenuated distal reconstitution. Finding may reflect a severe high-grade stenosis or possibly partially occlusive and/or recannulized thrombus. Area of occlusion measures approximately 7 mm in length. Per discussion with Dr. 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 This is not available for review or comparison at time of this dictation. While findings on this CT are felt to be within the M1 segment, it is possible that this lesion is the same lesion discussed on previous arteriogram from 2014. Direct comparison with previous images recommended if available. 2. Otherwise normal CTA of the head. CTA NECK IMPRESSION: Negative CTA of the neck. No flow-limiting or critical stenosis identified. Critical Value/emergent results were called by telephone at the time of interpretation on 12/08/2015 at 6:22 pm to Dr. Leonie Man , who verbally acknowledged these results. Electronically Signed   By: Jeannine Boga M.D.   On: 12/08/2015 19:05   Ct Head Wo Contrast  Result Date: 12/09/2015 CLINICAL DATA:  Progressive right-sided weakness. Status post revascularization of the left middle cerebral artery. EXAM: CT HEAD WITHOUT CONTRAST TECHNIQUE: Contiguous axial images were obtained from the base of the skull through the vertex without intravenous contrast. COMPARISON:  CTA head and neck 12/08/2015  FINDINGS: Brain: A developing area of hypoattenuation is present in the posterior left lentiform nucleus and extending superiorly within the centrum semi of ally. Small nonhemorrhagic  cortical infarcts are present in a watershed distribution over the left convexity as well. Previously noted hyperdensity over the left hemisphere likely reflected some element of luxury perfusion. There is no evidence for subarachnoid hemorrhage on today's study. The right basal ganglia are intact. Focal calcification along the anterior horn of the left lateral ventricle is stable. Vascular: A left M1 stent extends beyond the bifurcation. No significant vascular calcifications are present otherwise. Skull: The calvarium is intact. Sinuses/Orbits: Diffuse mucosal thickening is present throughout the paranasal sinuses. There are no significant fluid levels. The mastoid air cells are clear. IMPRESSION: 1. Developing areas of hypoattenuation involving the posterior left lentiform nucleus, the centrum semi ovale, and watershed cortical infarcts without hemorrhage. These represent areas of developing infarct within the left MCA territory of tissue at highest risk. No larger MCA territory infarct is evident. 2. Left MCA stent spanning from the proximal left M1 segment beyond the bifurcation. Electronically Signed   By: San Morelle M.D.   On: 12/09/2015 09:33   Ct Head Wo Contrast  Result Date: 12/09/2015 CLINICAL DATA:  50 y/o  M; left M1 occlusion revascularization. EXAM: CT HEAD WITHOUT CONTRAST TECHNIQUE: Contiguous axial images were obtained from the base of the skull through the vertex without intravenous contrast. COMPARISON:  12/08/2015 CT head. FINDINGS: Brain: Stable calcification in the left frontal periventricular white matter. No evidence for large territory infarct, focal mass effect, or brain parenchymal hemorrhage. Increased density within sulci in the left parietal and lateral temporal lobe in comparison with the contralateral hemisphere is probably due to vessel engorgement and hyperemia post revascularization. Less likely subarachnoid hemorrhage. Vascular: Left M1 segment stent. Skull:  Normal. Negative for fracture or focal lesion. Sinuses/Orbits: Moderate diffuse paranasal sinus mucosal thickening. Normal pneumatization of mastoid air cells. Orbits are unremarkable. Other: None. IMPRESSION: 1. No infarct or brain parenchymal hemorrhage identified. 2. Increased density within sulci in the left parietal and lateral temporal lobe in comparison with the contralateral hemisphere is probably due to vessel engorgement and hyperemia post revascularization, less likely subarachnoid hemorrhage. Electronically Signed   By: Kristine Garbe M.D.   On: 12/09/2015 00:03   Ct Angio Neck W And/or Wo Contrast  Result Date: 12/08/2015 CLINICAL DATA:  Initial evaluation for acute right-sided weakness, right-sided facial droop. EXAM: CT ANGIOGRAPHY HEAD AND NECK TECHNIQUE: Multidetector CT imaging of the head and neck was performed using the standard protocol during bolus administration of intravenous contrast. Multiplanar CT image reconstructions and MIPs were obtained to evaluate the vascular anatomy. Carotid stenosis measurements (when applicable) are obtained utilizing NASCET criteria, using the distal internal carotid diameter as the denominator. CONTRAST:  50 cc of Isovue 370. COMPARISON:  Comparison made with prior noncontrast head CT performed earlier on the same day. FINDINGS: CTA NECK FINDINGS Aortic arch: Visualized aortic arch is of normal caliber with normal 3 vessel morphology. No high-grade stenosis at the origin of the great vessels. Visualized subclavian arteries are widely patent. Right carotid system: Right common carotid artery patent from its origin to the bifurcation. No significant atheromatous plaque about the right bifurcation. Right ICA widely patent from the bifurcation to the skullbase. No stenosis, dissection, or vascular occlusion identified within the right carotid artery system. Right external carotid artery and its branches within normal limits. Left carotid system: Left  common carotid artery widely patent from its  origin two the bifurcation. No significant atheromatous plaque about the left bifurcation. Left ICA widely patent from the bifurcation to the skullbase. No stenosis, dissection, or vascular occlusion within the left carotid artery system. Left external carotid artery and its branches within normal limits. Vertebral arteries: Both of the vertebral arteries arise from the subclavian arteries. Vertebral arteries widely patent within the neck without stenosis, dissection, or occlusion. Skeleton: No acute osseous abnormality. No worrisome lytic or blastic osseous lesions. Other neck: Visualized soft tissues of the neck within normal limits without acute abnormality. Thyroid normal. No adenopathy. Upper chest: Visualized upper mediastinum is normal. Visualized lungs are clear. Review of the MIP images confirms the above findings CTA HEAD FINDINGS Anterior circulation: The petrous, cavernous, and supraclinoid segments of the internal carotid arteries are widely patent without flow-limiting stenosis. ICA termini themselves appear to be patent. A1 segments patent. Right A1 segment hypoplastic. Anterior communicating artery normal. Anterior cerebral arch well opacified to their distal aspects. On the left, there is abrupt cough of the left M1 segment, which fever reflects high-grade stenosis or possibly embolic occlusion. Area of occlusion measures approximately 7 mm. There is reconstitution distally, with attenuated flow within the distal left M1 segment and left MCA branches. Right M1 segment widely patent. Right MCA bifurcation normal. Distal right MCA branches well opacified. Posterior circulation: Vertebral arteries are patent to the vertebrobasilar junction. Mild atheromatous irregularity within the left V4 segment without high-grade stenosis. Posterior inferior cerebral arteries patent bilaterally. Basilar artery widely patent. Superior cerebral arteries patent bilaterally.  Right PCA supplied via the basilar artery and is well opacified to its distal aspect. There is a fetal type left PCA supplied via a widely patent left posterior communicating artery. Venous sinuses: Grossly patent. Anatomic variants: Fetal type left PCA. No aneurysm or vascular malformation. Delayed phase: Not performed. Review of the MIP images confirms the above findings IMPRESSION: CTA HEAD IMPRESSION: 1. Abrupt occlusion of the proximal left M1 segment, with attenuated distal reconstitution. Finding may reflect a severe high-grade stenosis or possibly partially occlusive and/or recannulized thrombus. Area of occlusion measures approximately 7 mm in length. Per discussion with Dr. 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 This is not available for review or comparison at time of this dictation. While findings on this CT are felt to be within the M1 segment, it is possible that this lesion is the same lesion discussed on previous arteriogram from 2014. Direct comparison with previous images recommended if available. 2. Otherwise normal CTA of the head. CTA NECK IMPRESSION: Negative CTA of the neck. No flow-limiting or critical stenosis identified. Critical Value/emergent results were called by telephone at the time of interpretation on 12/08/2015 at 6:22 pm to Dr. Leonie Man , who verbally acknowledged these results. Electronically Signed   By: Jeannine Boga M.D.   On: 12/08/2015 19:05   Mr Brain Wo Contrast  Result Date: 12/09/2015 CLINICAL DATA:  Initial evaluation for acute stroke, status post tPA with catheter directed revascularization and angioplasty. EXAM: MRI HEAD WITHOUT CONTRAST TECHNIQUE: Multiplanar, multiecho pulse sequences of the brain and surrounding structures were obtained without intravenous contrast. COMPARISON:  Prior CT from earlier same day as well as prior examinations from  12/08/2015. The FINDINGS: Brain: Cerebral volume normal for patient age. No significant cerebral white matter disease. There are patchy multi focal areas of restricted diffusion involving the left MCA territory, compatible with acute left MCA territory  infarcts. Specifically, many of these foci are cortical and subcortical in nature, in a somewhat watershed distribution. There is confluent restricted diffusion involving the left caudate and lentiform nuclei. Associated T2/FLAIR signal abnormality with gyral edema present within the areas of infarction. No significant associated hemorrhage. Susceptibility artifact at the level of the left caudate head is consistent with parenchymal calcification at this level. Left lateral ventricle is mildly attenuated without significant mass effect. No midline shift. No other evidence for acute ischemia. Gray-white matter differentiation otherwise maintained. No other evidence for acute or chronic intracranial hemorrhage. No other areas of chronic infarction. No mass lesion. No hydrocephalus. No extra-axial fluid collection. Major dural sinuses are grossly patent. Apparent FLAIR signal abnormality overlying the dural/posterior aspect of the brain is felt to be artifactual on this exam. Pituitary gland and suprasellar region within normal limits. Vascular: Extent susceptibility artifact within the left M1 segment due to vascular stent. Major intracranial vascular flow voids are otherwise maintained. Skull and upper cervical spine: Craniocervical junction normal. Visualized upper cervical spine unremarkable. Bone marrow signal intensity within normal limits. No scalp soft tissue abnormality. Sinuses/Orbits: Globes and orbital soft tissues within normal limits. Scattered mucosal thickening throughout the paranasal sinuses. Fluid present within the nasopharynx. Patient appears to be intubated. No mastoid effusion. Inner ear structures grossly normal. IMPRESSION: 1. Patchy multi focal  acute left MCA territory infarcts, overall moderate in volume. No associated hemorrhage or significant mass effect. 2. Long segment vascular stent in place within the left M1 segment. Electronically Signed   By: Jeannine Boga M.D.   On: 12/09/2015 19:23   Portable Chest Xray  Result Date: 12/09/2015 CLINICAL DATA:  50 y/o  M; endotracheal tube placement. EXAM: PORTABLE CHEST 1 VIEW COMPARISON:  None. FINDINGS: Cardiac silhouette within normal limits. Endotracheal tube tip 5.5 cm from carina. Endotracheal tube tip below the field of the in the abdomen, proximal side hole near the gastroesophageal junction. Clear lungs. No pneumothorax or effusion. IMPRESSION: Endotracheal tube tip 5.5 cm from carina at lower clavicle level. Enteric tube tip in the abdomen with proximal side hole near the gastroesophageal junction, suggest advancement. Clear lung. Electronically Signed   By: Kristine Garbe M.D.   On: 12/09/2015 00:05   Dg Abd Portable 1v  Result Date: 12/09/2015 CLINICAL DATA:  NG tube position EXAM: PORTABLE ABDOMEN - 1 VIEW COMPARISON:  None. FINDINGS: Normal small bowel gas pattern. There is NG tube with tip in distal stomach/pyloric region. IMPRESSION: NG tube with tip in distal stomach/ pyloric region. Electronically Signed   By: Lahoma Crocker M.D.   On: 12/09/2015 11:25   Dg Abd Portable 1v  Result Date: 12/09/2015 CLINICAL DATA:  50 y/o  M; nasogastric tube placement. EXAM: PORTABLE ABDOMEN - 1 VIEW COMPARISON:  None. FINDINGS: Normal bowel gas pattern. Enteric tube tip in proximal stomach with proximal side hole near the gastroesophageal junction, suggest advancement. Contrast accumulation in the kidneys from recent IV contrast administration. Mild degenerative changes of lumbar spine. IMPRESSION: Enteric tube tip in proximal stomach with proximal side hole near the gastroesophageal junction, suggest advancement. Electronically Signed   By: Kristine Garbe M.D.   On:  12/09/2015 00:06   Ct Head Code Stroke W/o Cm  Result Date: 12/08/2015 CLINICAL DATA:  Code stroke.  Right-sided weakness EXAM: CT HEAD WITHOUT CONTRAST TECHNIQUE: Contiguous axial images were obtained from the base of the skull through the vertex without intravenous contrast. COMPARISON:  CT head 03/11/2004 FINDINGS: Brain: Ventricle size normal. Cerebral volume normal.  Negative for acute infarct. No significant chronic ischemic change. 5 mm dense calcification in the left anterior head of caudate unchanged from the prior study. Probably an area of chronic infection such as granulomatous disease or cysticercosis. No surrounding edema. No other calcifications. Vascular: No hyperdense vessel or unexpected calcification. Skull: Negative Sinuses/Orbits: Mild mucosal edema in the paranasal sinuses. Mastoid sinus clear. Other: None ASPECTS ([place] Stroke Program Early CT Score) - Ganglionic level infarction (caudate, lentiform nuclei, internal capsule, insula, M1-M3 cortex): 7 - Supraganglionic infarction (M4-M6 cortex): 3 Total score (0-10 with 10 being normal): 10 IMPRESSION: 1. Negative for acute infarct. 2. Stable 5 mm calcification left head of caudate likely due to chronic infection. 3. ASPECTS is 10 These results were called by telephone at the time of interpretation on 12/08/2015 at 6:19 pm to Dr. Leonie Man, who verbally acknowledged these results. Electronically Signed   By: Franchot Gallo M.D.   On: 12/08/2015 18:19   Cerebral Angiography 12/08/2015 S/P lt common carotid arteriogram,followed by by complete revascularization of occluded Lt MCA m1 , with x pass with 4 mm x 40 mm solitaire retrieval device and stent assisted angioplasty of severe underlying stenosis with reocclusion achieving a TICI 2 b revascularization Also received 8 mg of superselective IA tpa.   PHYSICAL EXAM Middle aged 80 male who is intubated and sedated. . Afebrile. Head is nontraumatic. Neck is supple without bruit.     Cardiac exam no murmur or gallop. Lungs are clear to auscultation. Distal pulses are well felt. Neurological Exam :  Awake alert expressive aphasia.follows simple commands. Left gaze preference. Mild right gaze palsy. Pupils 4 mm reactive. Fundi not visualized.corneals present. Cough and gag poor . Mild right lower facial weakness. Tongue midline. Patient can move left upper and lower extremity spontaneously and against gravity. Patient has right hemiparesis with grade 2/3 strength on right.. Right plantar upgoing left downgoing. Tone is diminished on the right compared to the left. Gait was not tested. ASSESSMENT/PLAN Mr. Roel Sivers is a 50 y.o. male with no significant medical history presenting with right facial droop, right hemiparesis and difficulty with speech. He received IV t-PA 12/08/2015 at 1822. 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 Infarct secondary to large vessel disease source  Initial CT normal, ASPECTS 10  CTA head L M1 occlusion w/ distal reconstitution  CTA neck negative  Post IR CT no infarct or hmg. Increased density L parietal and lateral temporal lobe  CT head next am developing hypoattenuation posteior L leniform nucleus, CSV and watershed cortical infarcts without hmg. L MCA M1 stent beyond bifurcation MRI  1. Patchy multi focal acute left MCA territory infarcts, overall moderate in volume. No associated hemorrhage or significant mass effect. 2. Long segment vascular stent in place within the left M1 segment.     2D Echo  pending   LDL 87  HgbA1c 5.6  SCDs for VTE prophylaxis Diet NPO time specified Diet NPO time specified  No antithrombotic prior to admission, now on aspirin 325 mg daily and  clopidogrel 75 mg daily following a 300 mg plavix load  Ongoing aggressive stroke risk factor management  Therapy recommendations:  pending   Disposition:  pending   Acute respiratory failure  Intubated for neurointervention  CCM onboard  Blood Pressure  Goal per IR  No hx HTN, no home meds  Hyperlipidemia  Home meds:  No statin   LDL 87, goal < 70  Add statin once able to swallow or alternative feeding method in place  Other Stroke Risk Factors  Cigarette smoker, advised to stop smoking  ETOH use, advised to drink no more than 2 drink(s) a day  Other Active Problems  Leukocytosis 19.7, etiology unclear (CXR and UA ok)  Hospital day # [place] for Pager information 12/10/2015 2:49 PM  I have personally examined this patient, reviewed notes, independently viewed imaging studies, participated in medical decision making and plan of care.ROS completed by me personally and pertinent positives fully documented  I have made any additions or clarifications directly to the above note. Agree with note above. . Patient presented with left middle cerebral artery occlusion and underwent endovascular mechanical embolectomy with left MCA angioplasty and stent placement for underlying stenosis   Strict control of blood pressure. Aspirin and Plavix due to intracranial stent. Discuss with Dr. Milinda Hirschfeld, wife and daughter.Patient may need feeding tube if fails swallow evaluation. I also had a long discussion with the patient's wife and daughter who interpreted for patient's wife who speaks limited English about patient's prognosis, plan for treatment and answered questions. This patient is critically ill and at significant risk of neurological worsening, death and care requires constant monitoring of vital signs, hemodynamics,respiratory and cardiac monitoring, extensive review of multiple databases, frequent neurological assessment, discussion with  family, other specialists and medical decision making of high complexity.I have made any additions or clarifications directly to the above note.This critical care time does not reflect procedure time, or teaching time or supervisory time of PA/NP/Med Resident etc but could involve care discussion time.  I spent 35 minutes of neurocritical care time  in the care of  this patient.      Antony Contras, MD Medical Director [facility] Stroke Center Pager: [phone] 12/10/2015 2:49 PM To contact Stroke Continuity provider, please refer to [email or web address] After hours, contact General Neurology

## 2015-12-10 NOTE — Progress Notes (Signed)
Rehab Admissions Coordinator Note:  Patient was screened by Retta Diones for appropriateness for an Inpatient Acute Rehab Consult.  At this time, we are recommending Inpatient Rehab consult.  Retta Diones 12/10/2015, 2:45 PM  I can be reached at [phone].

## 2015-12-10 NOTE — Progress Notes (Signed)
Echocardiogram 2D Echocardiogram has been performed.  Aggie Cosier 12/10/2015, 4:36 PM

## 2015-12-10 NOTE — Evaluation (Addendum)
Physical Therapy Evaluation Patient Details Name: James Cortez MRN: [id number] DOB: 1965/05/01 Today's Date: 12/10/2015   History of Present Illness  Patient is a 50 y/o male with hx of skin cancer presents with right sided weakness and aphasia. MRI-  Patchy multi focal acute left MCA territory infarcts; occlusion proximal Left M1 segment s/p tPA with catheter directed revascularization and angioplasty.  Clinical Impression  Patient presents with right sided hemiparesis, language deficits (expressive > receptive), mild right inattention and impaired mobility s/p above. Tolerated standing and taking a few steps to the chair with Min A for balance. Pt prefers Spanish as language but able to speak English PTA. Difficult to assess cognition due to language barrier bs aphasia? Independent and active PTA. Pt with lots of secretions throughout session. Pt would be a great CIR candidate. Will follow acutely to maximize independence and mobility prior to return home with support of family.     Follow Up Recommendations CIR    Equipment Recommendations  Other (comment) (TBA)    Recommendations for Other Services       Precautions / Restrictions Precautions Precautions: Fall Restrictions Weight Bearing Restrictions: No      Mobility  Bed Mobility Overal bed mobility: Needs Assistance Bed Mobility: Supine to Sit     Supine to sit: Mod assist;HOB elevated     General bed mobility comments: Able to initiate moving LLE to EOB, assist with RLE and to elevate trunk but good intiation of movement overall. Not using RUE at all during transfer.  Transfers Overall transfer level: Needs assistance Equipment used: 1 person hand held assist Transfers: Sit to/from [facility] Sit to Stand: Min assist Stand pivot transfers: Min assist       General transfer comment: Assist to boost from EOB. SPT bed to chair with assist for balance. Pt dragging RLE.   Ambulation/Gait                 Stairs            Wheelchair Mobility    Modified Rankin (Stroke Patients Only)       Balance Overall balance assessment: Needs assistance Sitting-balance support: Feet supported;No upper extremity supported Sitting balance-Leahy Scale: Fair     Standing balance support: During functional activity Standing balance-Leahy Scale: Poor Standing balance comment: Reliant on external support for balance.                              Pertinent Vitals/Pain Pain Assessment: No/denies pain    Home Living Family/patient expects to be discharged to:: Private residence Living Arrangements: Spouse/significant other;Children Available Help at Discharge: Family Type of Home: House Home Access: Stairs to enter Entrance Stairs-Rails: None Entrance Stairs-Number of Steps: 3 Home Layout: One level Home Equipment: None      Prior Function Level of Independence: Independent         Comments: Works in [employer]; drives.      Hand Dominance   Dominant Hand: Right    Extremity/Trunk Assessment   Upper Extremity Assessment: Defer to OT evaluation;RUE deficits/detail (Difficult to assess due to receptive aphasia vs language deficits.) RUE Deficits / Details: Able to mobilize RUE involuntarily; weak grip.         Lower Extremity Assessment: RLE deficits/detail RLE Deficits / Details: Grossly ~3+/5 knee extension; difficulty following commands to participate inMMT but able to stand without knee buckling but weakness noted.     Cervical / Trunk  Assessment: Normal  Communication   Communication: Prefers language other than English;Expressive difficulties;Receptive difficulties (Prefers Spanish per family but able to understand and speak [place])  Cognition Arousal/Alertness: Awake/alert Behavior During Therapy: Flat affect Overall Cognitive Status: Difficult to assess Area of Impairment: Orientation;Problem solving;Following commands Orientation  Level: Disoriented to;Place;Person;Time;Situation     Following Commands: Follows one step commands inconsistently;Follows one step commands with increased time (multi modal cues)     Problem Solving: Slow processing;Decreased initiation;Requires verbal cues General Comments: Non verbal throughout session. Shakes head yes or no to questions inappropriately; shakes head no to correct name and if he is in the hospital. Mild right inattention noted but able to attend to right visually with cues.     General Comments General comments (skin integrity, edema, etc.): Family present during session. They assisted with translation during session. Report he can speak/understand english normally but primarily speaks [place].    Exercises     Assessment/Plan    PT Assessment Patient needs continued PT services  PT Problem List Decreased strength;Decreased mobility;Decreased safety awareness;Decreased balance;Decreased cognition;Decreased activity tolerance;Decreased range of motion          PT Treatment Interventions DME instruction;Therapeutic activities;Gait training;Therapeutic exercise;Patient/family education;Balance training;Functional mobility training;Stair training;Neuromuscular re-education    PT Goals (Current goals can be found in the Care Plan section)  Acute Rehab PT Goals Patient Stated Goal: none stated PT Goal Formulation: With patient Time For Goal Achievement: 12/24/15 Potential to Achieve Goals: Good    Frequency Min 4X/week   Barriers to discharge        Co-evaluation               End of Session Equipment Utilized During Treatment: Gait belt Activity Tolerance: Patient tolerated treatment well Patient left: in chair;with call bell/phone within reach;with family/visitor present Nurse Communication: Mobility status         Time: [id number] PT Time Calculation (min) (ACUTE ONLY): 23 min   Charges:   PT Evaluation $PT Eval Moderate Complexity: 1  Procedure PT Treatments $Therapeutic Activity: 8-22 mins   PT G Codes:        Halaina Vanduzer A Elizabethanne Lusher 12/10/2015, 11:50 AM Wray Kearns, PT, DPT [phone]

## 2015-12-10 NOTE — Progress Notes (Signed)
PULMONARY / CRITICAL CARE MEDICINE   Name: James Cortez MRN: [id number] DOB: 1965/07/14    ADMISSION DATE:  12/08/2015 CONSULTATION DATE:  12/08/2015  REFERRING MD:  Dr. Nicole Kindred  CHIEF COMPLAINT:  Stroke  HISTORY OF PRESENT ILLNESS:   50 year old male with no significant past medical history. He presented to [facility] October 31 after developing acute onset right sided facial droop and right-sided hemiparesis with associated aphasia while at his daughter's school. He was brought to the emergency department as a code stroke or CT angiogram of the head showed acute occlusion of the M1 segment of the left MCA. He was given systemic TPA and was taken to interventional radiology for revascularization, which was successful. Post procedurally he was taken to the ICU for recovery where he remains on the ventilator. PCCM consult and for vent/medical management.  SUBJECTIVE:  Patient self-extubated this morning. Patient is aphasic. Not following commands.   REVIEW OF SYSTEMS:  Unable to obtain given aphasia.   VITAL SIGNS: BP 133/79   Pulse 65   Temp 98.2 F (36.8 C) (Oral)   Resp (!) 25   Ht 5\' 10"  (1.778 m)   Wt 164 lb 3.9 oz (74.5 kg)   SpO2 100%   BMI 23.57 kg/m   HEMODYNAMICS:    VENTILATOR SETTINGS: Vent Mode: PRVC FiO2 (%):  [40 %] 40 % Set Rate:  [22 bmp] 22 bmp Vt Set:  [580 mL] 580 mL PEEP:  [5 cmH20] 5 cmH20 Plateau Pressure:  [18 cmH20-20 cmH20] 18 cmH20  INTAKE / OUTPUT: I/O last 3 completed shifts: In: 5869.8 [I.V.:5769.8; IV Piggyback:100] Out: [id number] [Urine:4200; Blood:50]  PHYSICAL EXAMINATION: General:  Awake. No acute distress. Wife & daughter at bedside.  Integument:  Warm & dry. No rash on exposed skin. HEENT:  No scleral injection or icterus. Moist mucus membranes.  Cardiovascular:  Regular rate. No edema. No appreciable JVD.  Pulmonary:  Clear on auscultation. Normal work of breathing on nasal cannula. Good cough. Abdomen: Soft. Normal bowel  sounds. Nondistended. Musculoskeletal:  No joint deformity or effusion appreciated. Neurological: Patient spontaneously moving extremities. Doesn't seem to consistently attend to voice. Not following commands.  LABS:  BMET  Recent Labs Lab 12/08/15 1759 12/09/15 0430 12/10/15 0500  NA 138 140 141  K 3.9 3.8 3.4*  CL 106 111 112*  CO2 22 23 22   BUN 13 8 6   CREATININE 0.90 0.71 0.64  GLUCOSE 108* 112* 105*    Electrolytes  Recent Labs Lab 12/08/15 1759 12/09/15 0430 12/10/15 0500  CALCIUM 9.3 8.0* 8.5*  MG  --   --  2.0  PHOS  --   --  2.7    CBC  Recent Labs Lab 12/08/15 1759 12/09/15 0430 12/10/15 0500  WBC 15.4* 19.7* 16.4*  HGB 18.2* 14.3 13.7  HCT 51.2 43.3 40.4  PLT 264 234 229    Coag's  Recent Labs Lab 12/08/15 1759  APTT 29  INR 0.99    Sepsis Markers  Recent Labs Lab 12/09/15 0430 12/10/15 0500  PROCALCITON <0.10 <0.10    ABG  Recent Labs Lab 12/09/15 0028  PHART 7.260*  PCO2ART 48.0  PO2ART 354.0*    Liver Enzymes  Recent Labs Lab 12/08/15 1759 12/10/15 0500  AST 27  --   ALT 34  --   ALKPHOS 71  --   BILITOT 0.3  --   ALBUMIN 4.4 3.0*    Cardiac Enzymes No results for input(s): TROPONINI, PROBNP in the last 168 hours.  Glucose  Recent Labs Lab 12/08/15 1750 12/09/15 1222  GLUCAP 111* 103*    Imaging Mr Brain Wo Contrast  Result Date: 12/09/2015 CLINICAL DATA:  Initial evaluation for acute stroke, status post tPA with catheter directed revascularization and angioplasty. EXAM: MRI HEAD WITHOUT CONTRAST TECHNIQUE: Multiplanar, multiecho pulse sequences of the brain and surrounding structures were obtained without intravenous contrast. COMPARISON:  Prior CT from earlier same day as well as prior examinations from 12/08/2015. The FINDINGS: Brain: Cerebral volume normal for patient age. No significant cerebral white matter disease. There are patchy multi focal areas of restricted diffusion involving the left  MCA territory, compatible with acute left MCA territory infarcts. Specifically, many of these foci are cortical and subcortical in nature, in a somewhat watershed distribution. There is confluent restricted diffusion involving the left caudate and lentiform nuclei. Associated T2/FLAIR signal abnormality with gyral edema present within the areas of infarction. No significant associated hemorrhage. Susceptibility artifact at the level of the left caudate head is consistent with parenchymal calcification at this level. Left lateral ventricle is mildly attenuated without significant mass effect. No midline shift. No other evidence for acute ischemia. Gray-white matter differentiation otherwise maintained. No other evidence for acute or chronic intracranial hemorrhage. No other areas of chronic infarction. No mass lesion. No hydrocephalus. No extra-axial fluid collection. Major dural sinuses are grossly patent. Apparent FLAIR signal abnormality overlying the dural/posterior aspect of the brain is felt to be artifactual on this exam. Pituitary gland and suprasellar region within normal limits. Vascular: Extent susceptibility artifact within the left M1 segment due to vascular stent. Major intracranial vascular flow voids are otherwise maintained. Skull and upper cervical spine: Craniocervical junction normal. Visualized upper cervical spine unremarkable. Bone marrow signal intensity within normal limits. No scalp soft tissue abnormality. Sinuses/Orbits: Globes and orbital soft tissues within normal limits. Scattered mucosal thickening throughout the paranasal sinuses. Fluid present within the nasopharynx. Patient appears to be intubated. No mastoid effusion. Inner ear structures grossly normal. IMPRESSION: 1. Patchy multi focal acute left MCA territory infarcts, overall moderate in volume. No associated hemorrhage or significant mass effect. 2. Long segment vascular stent in place within the left M1 segment.  Electronically Signed   By: Jeannine Boga M.D.   On: 12/09/2015 19:23   Dg Abd Portable 1v  Result Date: 12/09/2015 CLINICAL DATA:  NG tube position EXAM: PORTABLE ABDOMEN - 1 VIEW COMPARISON:  None. FINDINGS: Normal small bowel gas pattern. There is NG tube with tip in distal stomach/pyloric region. IMPRESSION: NG tube with tip in distal stomach/ pyloric region. Electronically Signed   By: Lahoma Crocker M.D.   On: 12/09/2015 11:25     STUDIES:  CT head 10/31:  Negative for acute infarct. Stable 5 mm calcification left head of caudate   CTA head/neck 10/31: L M1 occlusion with attenuated distal reconstitution CT Head w/o 11/1:  Developing areas of hypoattenuation involving the posterior left lentiform nucleus, the centrum semi ovale, and watershed cortical infarcts without hemorrhage. These represent areas of developing infarct within the left MCA territory of tissue at highest risk. No larger MCA territory infarct is evident. Left MCA stent spanning from the proximal left M1 segment beyond the bifurcation. MRI Brain W/O 11/1:  Patchy multi focal acute left MCA territory infarcts, overall moderate in volume. No associated hemorrhage or significant mass effect. Long segment vascular stent in place within the left M1 segment.  MICROBIOLOGY: MRSA PCR 11/1:  Negative   ANTIBIOTICS: None  SIGNIFICANT EVENTS: 10/31 - Admit w/  acute CVA tx w/ IV TPA then revascularization in IR 11/02 - self-extubated  LINES/TUBES: OETT 7.5 10/31 - 11/2 L RADIAL ART LINE 10/31 - 11/1 R FEM ART SHEATH 10/31 - 11/1 FOLEY 10/31 - 11/2 PIV x2  ASSESSMENT / PLAN:  NEUROLOGIC A:   Acute L M1 CVA - S/P systemic TPA and Neuro IR revascularization w/ embolectomy, angioplasty, & stent placement 10/31. With aphasia. Sedation on Ventilator  P:   Neurology/Neuro IR management post stroke D/C Propofol gtt Fentanyl IV prn Pain/Discomfort PT/OT/Speech Therapy Consulted  CARDIOVASCULAR A:  Hypertension -  New diagnosis post procedure. Intracranial Arterial Stent - Placed 10/31.  P:  Continuous telemetry monitoring Vitals per unit protocol BP Goal per Neuro & Neuro IR Cardene drip to maintain BP Goal ASA 325mg  & Plavix 75mg  daily  PULMONARY A: Acute Respiratory Failure - Unable to protect airway post procedure. Self-extubated 11/2 AM.  P:   Weaning FiO2 for Sat >92% Incentive Spirometry for pulmonary toilette Continuous Pulse Oximetry  RENAL A:   Hypokalemia - Mild. Replaced. Hyperchloremia - Mild.  P:   Trending UOP with Foley Monitoring electrolytes & renal function daily Replacing electrolytes as indicated KCl 23mEq IV x3 runs  GASTROINTESTINAL A:   No acute issues.  P:   NPO pending speech evaluation D/C Pepcid May need NGT depending on ability to swallow  HEMATOLOGIC A:   Leukocytosis - Unclear etiology. Improving.  P:  Trending cell counts daily w/ CBC SCDs  INFECTIOUS A:   No evidence of acute infection.  P:   Monitoring off antibiotics. Monitoring for signs of infection.  ENDOCRINE A:   No acute issues.  P:   Follow glucose on daily labs.   FAMILY  - Updates:  Daughter and wife updated 11/2 by Dr. Ashok Cordia at [place] family meet or Palliative Care meeting due by:  11/6   TODAY'S SUMMARY:  50 y.o. male suffered acute stroke with limited past medical history. Patient was treated with intravenous TPA and subsequently taken to interventional radiology for revascularization with stent placement into left middle cerebral artery. Patient's respiratory status seems to be stable post self extubation this morning. He does appear to be aphasic which may prove difficult with his swallowing evaluation today. Given his tenuous respiratory status we will continue to monitor him for the potential need for reintubation in the intensive care unit today.  I have spent a total of 31 minutes of critical care time today caring for the  patient and reviewing the patient's electronic medical record as well as discussing plan of care with Neurology.  Sonia Baller Ashok Cordia, M.D. [facility] Pulmonary & Critical Care Pager:  [phone] After 3pm or if no response, call [phone] 12/10/2015 9:58 AM

## 2015-12-10 NOTE — Progress Notes (Signed)
RT called to patient room due to patient self extubating.  Patient not noted to be in any distress.  Sats currently 100%.  Vitals are stable.  No evidence of stridor, however patient noted to be rhonchus in the neck.  Attempted to get patient to cough however do not believe that patient fully understands.  Will continue to monitor.

## 2015-12-10 NOTE — Progress Notes (Signed)
OT Cancellation Note  Patient Details Name: James Cortez MRN: [id number] DOB: 12-03-1965   Cancelled Treatment:    Reason Eval/Treat Not Completed: Patient not medically ready. Pt on bedrest. Please update activity orders when appropriate for therapy. Thanks  [place], OTR/L  [phone] 12/10/2015 12/10/2015, 6:26 AM

## 2015-12-10 NOTE — Progress Notes (Signed)
Referring Physician(s): P. Leonie Man  Supervising Physician: Luanne Bras  Patient Status:  [facility] - In-pt  Chief Complaint:  CVA  Lt MCA occulsion  Subjective:  Improved today Family in room Extubated   Allergies: Review of patient's allergies indicates no known allergies.  Medications: Prior to Admission medications   Medication Sig Start Date End Date Taking? Authorizing Provider  OVER THE COUNTER MEDICATION Place 1-2 drops into both eyes daily as needed (eye irritation).   Yes Historical Provider, MD  cyclobenzaprine (FLEXERIL) 10 MG tablet Take 1 tablet (10 mg total) by mouth 2 (two) times daily as needed for muscle spasms. 01/14/15   [name], PA-C  ibuprofen (ADVIL,MOTRIN) 600 MG tablet Take 1 tablet (600 mg total) by mouth every 6 (six) hours as needed. Patient not taking: Reported on 12/09/2015 01/14/15   [name], PA-C     Vital Signs: BP 133/79   Pulse 65   Temp 98.2 F (36.8 C) (Oral)   Resp (!) 25   Ht 5\' 10"  (1.778 m)   Wt 164 lb 3.9 oz (74.5 kg)   SpO2 100%   BMI 23.57 kg/m   Physical Exam  HENT:  More alert Difficult for patient to follow me or focus No speech   Musculoskeletal:  Does follow command to grip and move bilateral legs, weak bilaterally   Neurological:  More alert Extubated Does move all 4s minimally   Skin:  Rt groin: non-tender, no bleeding, no hematoma, c/d/i Rt foot - 2+ pulses   Nursing note and vitals reviewed.   Imaging: Ct Angio Head W Or Wo Contrast  Result Date: 12/08/2015 CLINICAL DATA:  Initial evaluation for acute right-sided weakness, right-sided facial droop. EXAM: CT ANGIOGRAPHY HEAD AND NECK TECHNIQUE: Multidetector CT imaging of the head and neck was performed using the standard protocol during bolus administration of intravenous contrast. Multiplanar CT image reconstructions and MIPs were obtained to evaluate the vascular anatomy. Carotid stenosis measurements (when applicable)  are obtained utilizing NASCET criteria, using the distal internal carotid diameter as the denominator. CONTRAST:  50 cc of Isovue 370. COMPARISON:  Comparison made with prior noncontrast head CT performed earlier on the same day. FINDINGS: CTA NECK FINDINGS Aortic arch: Visualized aortic arch is of normal caliber with normal 3 vessel morphology. No high-grade stenosis at the origin of the great vessels. Visualized subclavian arteries are widely patent. Right carotid system: Right common carotid artery patent from its origin to the bifurcation. No significant atheromatous plaque about the right bifurcation. Right ICA widely patent from the bifurcation to the skullbase. No stenosis, dissection, or vascular occlusion identified within the right carotid artery system. Right external carotid artery and its branches within normal limits. Left carotid system: Left common carotid artery widely patent from its origin two the bifurcation. No significant atheromatous plaque about the left bifurcation. Left ICA widely patent from the bifurcation to the skullbase. No stenosis, dissection, or vascular occlusion within the left carotid artery system. Left external carotid artery and its branches within normal limits. Vertebral arteries: Both of the vertebral arteries arise from the subclavian arteries. Vertebral arteries widely patent within the neck without stenosis, dissection, or occlusion. Skeleton: No acute osseous abnormality. No worrisome lytic or blastic osseous lesions. Other neck: Visualized soft tissues of the neck within normal limits without acute abnormality. Thyroid normal. No adenopathy. Upper chest: Visualized upper mediastinum is normal. Visualized lungs are clear. Review of the MIP images confirms the above findings CTA HEAD FINDINGS Anterior circulation:  The petrous, cavernous, and supraclinoid segments of the internal carotid arteries are widely patent without flow-limiting stenosis. ICA termini themselves  appear to be patent. A1 segments patent. Right A1 segment hypoplastic. Anterior communicating artery normal. Anterior cerebral arch well opacified to their distal aspects. On the left, there is abrupt cough of the left M1 segment, which fever reflects high-grade stenosis or possibly embolic occlusion. Area of occlusion measures approximately 7 mm. There is reconstitution distally, with attenuated flow within the distal left M1 segment and left MCA branches. Right M1 segment widely patent. Right MCA bifurcation normal. Distal right MCA branches well opacified. Posterior circulation: Vertebral arteries are patent to the vertebrobasilar junction. Mild atheromatous irregularity within the left V4 segment without high-grade stenosis. Posterior inferior cerebral arteries patent bilaterally. Basilar artery widely patent. Superior cerebral arteries patent bilaterally. Right PCA supplied via the basilar artery and is well opacified to its distal aspect. There is a fetal type left PCA supplied via a widely patent left posterior communicating artery. Venous sinuses: Grossly patent. Anatomic variants: Fetal type left PCA. No aneurysm or vascular malformation. Delayed phase: Not performed. Review of the MIP images confirms the above findings IMPRESSION: CTA HEAD IMPRESSION: 1. Abrupt occlusion of the proximal left M1 segment, with attenuated distal reconstitution. Finding may reflect a severe high-grade stenosis or possibly partially occlusive and/or recannulized thrombus. Area of occlusion measures approximately 7 mm in length. Per discussion with Dr. 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 This is not available for review or comparison at time of this dictation. While findings on this CT are felt to be within the M1 segment, it is possible that this lesion is the same lesion discussed on previous arteriogram from 2014.  Direct comparison with previous images recommended if available. 2. Otherwise normal CTA of the head. CTA NECK IMPRESSION: Negative CTA of the neck. No flow-limiting or critical stenosis identified. Critical Value/emergent results were called by telephone at the time of interpretation on 12/08/2015 at 6:22 pm to Dr. Leonie Man , who verbally acknowledged these results. Electronically Signed   By: Jeannine Boga M.D.   On: 12/08/2015 19:05   Ct Head Wo Contrast  Result Date: 12/09/2015 CLINICAL DATA:  Progressive right-sided weakness. Status post revascularization of the left middle cerebral artery. EXAM: CT HEAD WITHOUT CONTRAST TECHNIQUE: Contiguous axial images were obtained from the base of the skull through the vertex without intravenous contrast. COMPARISON:  CTA head and neck 12/08/2015 FINDINGS: Brain: A developing area of hypoattenuation is present in the posterior left lentiform nucleus and extending superiorly within the centrum semi of ally. Small nonhemorrhagic cortical infarcts are present in a watershed distribution over the left convexity as well. Previously noted hyperdensity over the left hemisphere likely reflected some element of luxury perfusion. There is no evidence for subarachnoid hemorrhage on today's study. The right basal ganglia are intact. Focal calcification along the anterior horn of the left lateral ventricle is stable. Vascular: A left M1 stent extends beyond the bifurcation. No significant vascular calcifications are present otherwise. Skull: The calvarium is intact. Sinuses/Orbits: Diffuse mucosal thickening is present throughout the paranasal sinuses. There are no significant fluid levels. The mastoid air cells are clear. IMPRESSION: 1. Developing areas of hypoattenuation involving the posterior left lentiform nucleus, the centrum semi ovale, and watershed cortical infarcts without hemorrhage. These represent areas of developing infarct within the left MCA territory of tissue  at highest risk. No  larger MCA territory infarct is evident. 2. Left MCA stent spanning from the proximal left M1 segment beyond the bifurcation. Electronically Signed   By: San Morelle M.D.   On: 12/09/2015 09:33   Ct Head Wo Contrast  Result Date: 12/09/2015 CLINICAL DATA:  50 y/o  M; left M1 occlusion revascularization. EXAM: CT HEAD WITHOUT CONTRAST TECHNIQUE: Contiguous axial images were obtained from the base of the skull through the vertex without intravenous contrast. COMPARISON:  12/08/2015 CT head. FINDINGS: Brain: Stable calcification in the left frontal periventricular white matter. No evidence for large territory infarct, focal mass effect, or brain parenchymal hemorrhage. Increased density within sulci in the left parietal and lateral temporal lobe in comparison with the contralateral hemisphere is probably due to vessel engorgement and hyperemia post revascularization. Less likely subarachnoid hemorrhage. Vascular: Left M1 segment stent. Skull: Normal. Negative for fracture or focal lesion. Sinuses/Orbits: Moderate diffuse paranasal sinus mucosal thickening. Normal pneumatization of mastoid air cells. Orbits are unremarkable. Other: None. IMPRESSION: 1. No infarct or brain parenchymal hemorrhage identified. 2. Increased density within sulci in the left parietal and lateral temporal lobe in comparison with the contralateral hemisphere is probably due to vessel engorgement and hyperemia post revascularization, less likely subarachnoid hemorrhage. Electronically Signed   By: Kristine Garbe M.D.   On: 12/09/2015 00:03   Ct Angio Neck W And/or Wo Contrast  Result Date: 12/08/2015 CLINICAL DATA:  Initial evaluation for acute right-sided weakness, right-sided facial droop. EXAM: CT ANGIOGRAPHY HEAD AND NECK TECHNIQUE: Multidetector CT imaging of the head and neck was performed using the standard protocol during bolus administration of intravenous contrast. Multiplanar CT image  reconstructions and MIPs were obtained to evaluate the vascular anatomy. Carotid stenosis measurements (when applicable) are obtained utilizing NASCET criteria, using the distal internal carotid diameter as the denominator. CONTRAST:  50 cc of Isovue 370. COMPARISON:  Comparison made with prior noncontrast head CT performed earlier on the same day. FINDINGS: CTA NECK FINDINGS Aortic arch: Visualized aortic arch is of normal caliber with normal 3 vessel morphology. No high-grade stenosis at the origin of the great vessels. Visualized subclavian arteries are widely patent. Right carotid system: Right common carotid artery patent from its origin to the bifurcation. No significant atheromatous plaque about the right bifurcation. Right ICA widely patent from the bifurcation to the skullbase. No stenosis, dissection, or vascular occlusion identified within the right carotid artery system. Right external carotid artery and its branches within normal limits. Left carotid system: Left common carotid artery widely patent from its origin two the bifurcation. No significant atheromatous plaque about the left bifurcation. Left ICA widely patent from the bifurcation to the skullbase. No stenosis, dissection, or vascular occlusion within the left carotid artery system. Left external carotid artery and its branches within normal limits. Vertebral arteries: Both of the vertebral arteries arise from the subclavian arteries. Vertebral arteries widely patent within the neck without stenosis, dissection, or occlusion. Skeleton: No acute osseous abnormality. No worrisome lytic or blastic osseous lesions. Other neck: Visualized soft tissues of the neck within normal limits without acute abnormality. Thyroid normal. No adenopathy. Upper chest: Visualized upper mediastinum is normal. Visualized lungs are clear. Review of the MIP images confirms the above findings CTA HEAD FINDINGS Anterior circulation: The petrous, cavernous, and  supraclinoid segments of the internal carotid arteries are widely patent without flow-limiting stenosis. ICA termini themselves appear to be patent. A1 segments patent. Right A1 segment hypoplastic. Anterior communicating artery normal. Anterior cerebral arch well opacified to their distal  aspects. On the left, there is abrupt cough of the left M1 segment, which fever reflects high-grade stenosis or possibly embolic occlusion. Area of occlusion measures approximately 7 mm. There is reconstitution distally, with attenuated flow within the distal left M1 segment and left MCA branches. Right M1 segment widely patent. Right MCA bifurcation normal. Distal right MCA branches well opacified. Posterior circulation: Vertebral arteries are patent to the vertebrobasilar junction. Mild atheromatous irregularity within the left V4 segment without high-grade stenosis. Posterior inferior cerebral arteries patent bilaterally. Basilar artery widely patent. Superior cerebral arteries patent bilaterally. Right PCA supplied via the basilar artery and is well opacified to its distal aspect. There is a fetal type left PCA supplied via a widely patent left posterior communicating artery. Venous sinuses: Grossly patent. Anatomic variants: Fetal type left PCA. No aneurysm or vascular malformation. Delayed phase: Not performed. Review of the MIP images confirms the above findings IMPRESSION: CTA HEAD IMPRESSION: 1. Abrupt occlusion of the proximal left M1 segment, with attenuated distal reconstitution. Finding may reflect a severe high-grade stenosis or possibly partially occlusive and/or recannulized thrombus. Area of occlusion measures approximately 7 mm in length. Per discussion with Dr. 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 This is not available for review or comparison at time of this dictation. While findings on this CT are felt  to be within the M1 segment, it is possible that this lesion is the same lesion discussed on previous arteriogram from 2014. Direct comparison with previous images recommended if available. 2. Otherwise normal CTA of the head. CTA NECK IMPRESSION: Negative CTA of the neck. No flow-limiting or critical stenosis identified. Critical Value/emergent results were called by telephone at the time of interpretation on 12/08/2015 at 6:22 pm to Dr. Leonie Man , who verbally acknowledged these results. Electronically Signed   By: Jeannine Boga M.D.   On: 12/08/2015 19:05   Mr Brain Wo Contrast  Result Date: 12/09/2015 CLINICAL DATA:  Initial evaluation for acute stroke, status post tPA with catheter directed revascularization and angioplasty. EXAM: MRI HEAD WITHOUT CONTRAST TECHNIQUE: Multiplanar, multiecho pulse sequences of the brain and surrounding structures were obtained without intravenous contrast. COMPARISON:  Prior CT from earlier same day as well as prior examinations from 12/08/2015. The FINDINGS: Brain: Cerebral volume normal for patient age. No significant cerebral white matter disease. There are patchy multi focal areas of restricted diffusion involving the left MCA territory, compatible with acute left MCA territory infarcts. Specifically, many of these foci are cortical and subcortical in nature, in a somewhat watershed distribution. There is confluent restricted diffusion involving the left caudate and lentiform nuclei. Associated T2/FLAIR signal abnormality with gyral edema present within the areas of infarction. No significant associated hemorrhage. Susceptibility artifact at the level of the left caudate head is consistent with parenchymal calcification at this level. Left lateral ventricle is mildly attenuated without significant mass effect. No midline shift. No other evidence for acute ischemia. Gray-white matter differentiation otherwise maintained. No other evidence for acute or chronic  intracranial hemorrhage. No other areas of chronic infarction. No mass lesion. No hydrocephalus. No extra-axial fluid collection. Major dural sinuses are grossly patent. Apparent FLAIR signal abnormality overlying the dural/posterior aspect of the brain is felt to be artifactual on this exam. Pituitary gland and suprasellar region within normal limits. Vascular: Extent susceptibility artifact within the left M1 segment due to vascular stent. Major intracranial vascular flow voids are otherwise maintained.  Skull and upper cervical spine: Craniocervical junction normal. Visualized upper cervical spine unremarkable. Bone marrow signal intensity within normal limits. No scalp soft tissue abnormality. Sinuses/Orbits: Globes and orbital soft tissues within normal limits. Scattered mucosal thickening throughout the paranasal sinuses. Fluid present within the nasopharynx. Patient appears to be intubated. No mastoid effusion. Inner ear structures grossly normal. IMPRESSION: 1. Patchy multi focal acute left MCA territory infarcts, overall moderate in volume. No associated hemorrhage or significant mass effect. 2. Long segment vascular stent in place within the left M1 segment. Electronically Signed   By: Jeannine Boga M.D.   On: 12/09/2015 19:23   Portable Chest Xray  Result Date: 12/09/2015 CLINICAL DATA:  50 y/o  M; endotracheal tube placement. EXAM: PORTABLE CHEST 1 VIEW COMPARISON:  None. FINDINGS: Cardiac silhouette within normal limits. Endotracheal tube tip 5.5 cm from carina. Endotracheal tube tip below the field of the in the abdomen, proximal side hole near the gastroesophageal junction. Clear lungs. No pneumothorax or effusion. IMPRESSION: Endotracheal tube tip 5.5 cm from carina at lower clavicle level. Enteric tube tip in the abdomen with proximal side hole near the gastroesophageal junction, suggest advancement. Clear lung. Electronically Signed   By: Kristine Garbe M.D.   On: 12/09/2015  00:05   Dg Abd Portable 1v  Result Date: 12/09/2015 CLINICAL DATA:  NG tube position EXAM: PORTABLE ABDOMEN - 1 VIEW COMPARISON:  None. FINDINGS: Normal small bowel gas pattern. There is NG tube with tip in distal stomach/pyloric region. IMPRESSION: NG tube with tip in distal stomach/ pyloric region. Electronically Signed   By: Lahoma Crocker M.D.   On: 12/09/2015 11:25   Dg Abd Portable 1v  Result Date: 12/09/2015 CLINICAL DATA:  50 y/o  M; nasogastric tube placement. EXAM: PORTABLE ABDOMEN - 1 VIEW COMPARISON:  None. FINDINGS: Normal bowel gas pattern. Enteric tube tip in proximal stomach with proximal side hole near the gastroesophageal junction, suggest advancement. Contrast accumulation in the kidneys from recent IV contrast administration. Mild degenerative changes of lumbar spine. IMPRESSION: Enteric tube tip in proximal stomach with proximal side hole near the gastroesophageal junction, suggest advancement. Electronically Signed   By: Kristine Garbe M.D.   On: 12/09/2015 00:06   Ct Head Code Stroke W/o Cm  Result Date: 12/08/2015 CLINICAL DATA:  Code stroke.  Right-sided weakness EXAM: CT HEAD WITHOUT CONTRAST TECHNIQUE: Contiguous axial images were obtained from the base of the skull through the vertex without intravenous contrast. COMPARISON:  CT head 03/11/2004 FINDINGS: Brain: Ventricle size normal. Cerebral volume normal. Negative for acute infarct. No significant chronic ischemic change. 5 mm dense calcification in the left anterior head of caudate unchanged from the prior study. Probably an area of chronic infection such as granulomatous disease or cysticercosis. No surrounding edema. No other calcifications. Vascular: No hyperdense vessel or unexpected calcification. Skull: Negative Sinuses/Orbits: Mild mucosal edema in the paranasal sinuses. Mastoid sinus clear. Other: None ASPECTS (Morse Stroke Program Early CT Score) - Ganglionic level infarction (caudate, lentiform nuclei,  internal capsule, insula, M1-M3 cortex): 7 - Supraganglionic infarction (M4-M6 cortex): 3 Total score (0-10 with 10 being normal): 10 IMPRESSION: 1. Negative for acute infarct. 2. Stable 5 mm calcification left head of caudate likely due to chronic infection. 3. ASPECTS is 10 These results were called by telephone at the time of interpretation on 12/08/2015 at 6:19 pm to Dr. Leonie Man, who verbally acknowledged these results. Electronically Signed   By: Franchot Gallo M.D.   On: 12/08/2015 18:19    Labs:  CBC:  Recent Labs  12/08/15 1755 12/08/15 1759 12/09/15 0430 12/10/15 0500  WBC  --  15.4* 19.7* 16.4*  HGB 18.0* 18.2* 14.3 13.7  HCT 53.0* 51.2 43.3 40.4  PLT  --  264 234 229    COAGS:  Recent Labs  12/08/15 1759  INR 0.99  APTT 29    BMP:  Recent Labs  12/08/15 1755 12/08/15 1759 12/09/15 0430 12/10/15 0500  NA 140 138 140 141  K 3.8 3.9 3.8 3.4*  CL 104 106 111 112*  CO2  --  22 23 22   GLUCOSE 106* 108* 112* 105*  BUN 14 13 8 6   CALCIUM  --  9.3 8.0* 8.5*  CREATININE 0.70 0.90 0.71 0.64  GFRNONAA  --  >60 >60 >60  GFRAA  --  >60 >60 >60    LIVER FUNCTION TESTS:  Recent Labs  12/08/15 1759 12/10/15 0500  BILITOT 0.3  --   AST 27  --   ALT 34  --   ALKPHOS 71  --   PROT 7.1  --   ALBUMIN 4.4 3.0*    Assessment and Plan:  CVA/ L MCA occlusion Revascularization in IR 10/31 Improved Further management per CCM  Electronically Signed: Kayshawn Ozburn A 12/10/2015, 10:09 AM   I spent a total of 15 Minutes at the the patient's bedside AND on the patient's hospital floor or unit, greater than 50% of which was counseling/coordinating care for L MCA revascularization.

## 2015-12-10 NOTE — Consult Note (Signed)
Physical Medicine and Rehabilitation Consult Reason for Consult: Left MCA territory infarct Referring Physician: Dr. Leonie Man   HPI: James Cortez is a 50 y.o. Spanish limited English-speaking right handed male with history of tobacco abuse. Per chart review patient lives with cousin. Independent prior to admission. 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 Present 12/08/2015 with right-sided weakness facial droop and aphasia. Cranial CT scan negative. Patient did receive TPA. CT angiogram showed acute occlusion of the M1 segment of the left middle cerebral artery. 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 No associated hemorrhage. Underwent revascularization per interventional radiology. Patient self extubated after procedure. Echocardiogram pending. Currently on Plavix for CVA prophylaxis. Physical therapy evaluation completed 12/10/2015 with recommendations of physical medicine rehabilitation consult.  Patient is non-English speaking. Wife at bedside with minimal English. However, his daughter speaks fluent [place] and [place]. Daughter indicates that the patient is unable to carry on a conversation with them. He sometimes can follow commands.  Review of Systems  Unable to perform ROS: Language  Aphasic in native language Past Medical History:  Diagnosis Date  . Skin cancer    Past Surgical History:  Procedure Laterality Date  . RADIOLOGY WITH ANESTHESIA Right 12/08/2015   Procedure: RADIOLOGY WITH ANESTHESIA - CODE STROKE;  Surgeon: Medication Radiologist, MD;  Location: [facility];  Service: Radiology;  Laterality: Right;  . SKIN SURGERY     History reviewed. No pertinent family history. Social History:  reports that he has been smoking Cigarettes.  He has been smoking about 1.00 pack per day. He has never used smokeless tobacco. He reports that he drinks alcohol. He reports that he does not use drugs. Allergies: No Known  Allergies Medications Prior to Admission  Medication Sig Dispense Refill  . OVER THE COUNTER MEDICATION Place 1-2 drops into both eyes daily as needed (eye irritation).    . cyclobenzaprine (FLEXERIL) 10 MG tablet Take 1 tablet (10 mg total) by mouth 2 (two) times daily as needed for muscle spasms. 20 tablet 0  . ibuprofen (ADVIL,MOTRIN) 600 MG tablet Take 1 tablet (600 mg total) by mouth every 6 (six) hours as needed. (Patient not taking: Reported on 12/09/2015) 30 tablet 0    Home: [place] expects to be discharged to:: Private residence Living Arrangements: Spouse/significant other, Children Available Help at Discharge: Family Type of Home: House Home Access: Stairs to enter Technical brewer of Steps: 3 Entrance Stairs-Rails: None Home Layout: One level Biochemist, clinical: Standard Home Equipment: None  Functional History: Prior Function Level of Independence: Independent Comments: Works in Architect; drives.  Functional Status:  Mobility: Bed Mobility Overal bed mobility: Needs Assistance Bed Mobility: Supine to Sit Supine to sit: Mod assist, HOB elevated General bed mobility comments: Able to initiate moving LLE to EOB, assist with RLE and to elevate trunk but good intiation of movement overall. Not using RUE at all during transfer. Transfers Overall transfer level: Needs assistance Equipment used: 1 person hand held assist Transfers: Sit to/from Stand, Stand Pivot Transfers Sit to Stand: Min assist Stand pivot transfers: Min assist General transfer comment: Assist to boost from EOB. SPT bed to chair with assist for balance. Pt dragging RLE.       ADL:    Cognition: Cognition Overall Cognitive Status: Difficult to assess Orientation Level: Other (comment) (language barrier) Cognition Arousal/Alertness: Awake/alert Behavior During Therapy: Flat affect Overall Cognitive Status: Difficult to assess Area of Impairment: Orientation, Problem  solving,  Following commands Orientation Level: Disoriented to, Place, Person, Time, Situation Following Commands: Follows one step commands inconsistently, Follows one step commands with increased time (multi modal cues) Problem Solving: Slow processing, Decreased initiation, Requires verbal cues General Comments: Non verbal throughout session. Shakes head yes or no to questions inappropriately; shakes head no to correct name and if he is in the hospital. Mild right inattention noted but able to attend to right visually with cues.  Difficult to assess due to: Impaired communication  Blood pressure 121/68, pulse 69, temperature 98.9 F (37.2 C), temperature source Oral, resp. rate (!) 26, height 5\' 10"  (1.778 m), weight 74.5 kg (164 lb 3.9 oz), SpO2 100 %. Physical Exam  HENT:  Right facial droop  Eyes:  Pupils sluggish to light  Neck: Normal range of motion. Neck supple. No thyromegaly present.  Cardiovascular: Normal rate and regular rhythm.   Respiratory:  Decreased breath sounds at the bases  GI: Soft. Bowel sounds are normal. He exhibits no distension.  Neurological:  Lethargic but arousable. Patient appears to be aphasic. He did not follow commands. Grimaces to deep palpation  Skin: Skin is warm and dry.  , Trace right finger flexion trace. Right elbow extension 0 elbow flexion 0. Shoulder abduction on the right. Left side does not cooperate with examination. Some spontaneous movement noted of the left upper extremity. Patient has spontaneous movement and antigravity strength in bilateral lower extremities Sensation cannot assess secondary to aphasia Results for orders placed or performed during the hospital encounter of 12/08/15 (from the past 24 hour(s))  Procalcitonin     Status: None   Collection Time: 12/10/15  5:00 AM  Result Value Ref Range   Procalcitonin <0.10 ng/mL  Triglycerides     Status: Abnormal   Collection Time: 12/10/15  5:00 AM  Result Value Ref Range    Triglycerides 170 (H) <150 mg/dL  Renal function panel     Status: Abnormal   Collection Time: 12/10/15  5:00 AM  Result Value Ref Range   Sodium 141 135 - 145 mmol/L   Potassium 3.4 (L) 3.5 - 5.1 mmol/L   Chloride 112 (H) 101 - 111 mmol/L   CO2 22 22 - 32 mmol/L   Glucose, Bld 105 (H) 65 - 99 mg/dL   BUN 6 6 - 20 mg/dL   Creatinine, Ser 0.64 0.61 - 1.24 mg/dL   Calcium 8.5 (L) 8.9 - 10.3 mg/dL   Phosphorus 2.7 2.5 - 4.6 mg/dL   Albumin 3.0 (L) 3.5 - 5.0 g/dL   GFR calc non Af Amer >60 >60 mL/min   GFR calc Af Amer >60 >60 mL/min   Anion gap 7 5 - 15  CBC with Differential/Platelet     Status: Abnormal   Collection Time: 12/10/15  5:00 AM  Result Value Ref Range   WBC 16.4 (H) 4.0 - 10.5 K/uL   RBC 4.56 4.22 - 5.81 MIL/uL   Hemoglobin 13.7 13.0 - 17.0 g/dL   HCT 40.4 39.0 - 52.0 %   MCV 88.6 78.0 - 100.0 fL   MCH 30.0 26.0 - 34.0 pg   MCHC 33.9 30.0 - 36.0 g/dL   RDW 13.3 11.5 - 15.5 %   Platelets 229 150 - 400 K/uL   Neutrophils Relative % 70 %   Neutro Abs 11.6 (H) 1.7 - 7.7 K/uL   Lymphocytes Relative 23 %   Lymphs Abs 3.7 0.7 - 4.0 K/uL   Monocytes Relative 7 %   Monocytes Absolute 1.1 (H) 0.1 -  1.0 K/uL   Eosinophils Relative 0 %   Eosinophils Absolute 0.1 0.0 - 0.7 K/uL   Basophils Relative 0 %   Basophils Absolute 0.0 0.0 - 0.1 K/uL  Magnesium     Status: None   Collection Time: 12/10/15  5:00 AM  Result Value Ref Range   Magnesium 2.0 1.7 - 2.4 mg/dL   Ct Angio Head W Or Wo Contrast  Result Date: 12/08/2015 CLINICAL DATA:  Initial evaluation for acute right-sided weakness, right-sided facial droop. EXAM: CT ANGIOGRAPHY HEAD AND NECK TECHNIQUE: Multidetector CT imaging of the head and neck was performed using the standard protocol during bolus administration of intravenous contrast. Multiplanar CT image reconstructions and MIPs were obtained to evaluate the vascular anatomy. Carotid stenosis measurements (when applicable) are obtained utilizing NASCET criteria,  using the distal internal carotid diameter as the denominator. CONTRAST:  50 cc of Isovue 370. COMPARISON:  Comparison made with prior noncontrast head CT performed earlier on the same day. FINDINGS: CTA NECK FINDINGS Aortic arch: Visualized aortic arch is of normal caliber with normal 3 vessel morphology. No high-grade stenosis at the origin of the great vessels. Visualized subclavian arteries are widely patent. Right carotid system: Right common carotid artery patent from its origin to the bifurcation. No significant atheromatous plaque about the right bifurcation. Right ICA widely patent from the bifurcation to the skullbase. No stenosis, dissection, or vascular occlusion identified within the right carotid artery system. Right external carotid artery and its branches within normal limits. Left carotid system: Left common carotid artery widely patent from its origin two the bifurcation. No significant atheromatous plaque about the left bifurcation. Left ICA widely patent from the bifurcation to the skullbase. No stenosis, dissection, or vascular occlusion within the left carotid artery system. Left external carotid artery and its branches within normal limits. Vertebral arteries: Both of the vertebral arteries arise from the subclavian arteries. Vertebral arteries widely patent within the neck without stenosis, dissection, or occlusion. Skeleton: No acute osseous abnormality. No worrisome lytic or blastic osseous lesions. Other neck: Visualized soft tissues of the neck within normal limits without acute abnormality. Thyroid normal. No adenopathy. Upper chest: Visualized upper mediastinum is normal. Visualized lungs are clear. Review of the MIP images confirms the above findings CTA HEAD FINDINGS Anterior circulation: The petrous, cavernous, and supraclinoid segments of the internal carotid arteries are widely patent without flow-limiting stenosis. ICA termini themselves appear to be patent. A1 segments patent.  Right A1 segment hypoplastic. Anterior communicating artery normal. Anterior cerebral arch well opacified to their distal aspects. On the left, there is abrupt cough of the left M1 segment, which fever reflects high-grade stenosis or possibly embolic occlusion. Area of occlusion measures approximately 7 mm. There is reconstitution distally, with attenuated flow within the distal left M1 segment and left MCA branches. Right M1 segment widely patent. Right MCA bifurcation normal. Distal right MCA branches well opacified. Posterior circulation: Vertebral arteries are patent to the vertebrobasilar junction. Mild atheromatous irregularity within the left V4 segment without high-grade stenosis. Posterior inferior cerebral arteries patent bilaterally. Basilar artery widely patent. Superior cerebral arteries patent bilaterally. Right PCA supplied via the basilar artery and is well opacified to its distal aspect. There is a fetal type left PCA supplied via a widely patent left posterior communicating artery. Venous sinuses: Grossly patent. Anatomic variants: Fetal type left PCA. No aneurysm or vascular malformation. Delayed phase: Not performed. Review of the MIP images confirms the above findings IMPRESSION: CTA HEAD IMPRESSION: 1. Abrupt occlusion of the  proximal left M1 segment, with attenuated distal reconstitution. Finding may reflect a severe high-grade stenosis or possibly partially occlusive and/or recannulized thrombus. Area of occlusion measures approximately 7 mm in length. Per discussion with Dr. 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 This is not available for review or comparison at time of this dictation. While findings on this CT are felt to be within the M1 segment, it is possible that this lesion is the same lesion discussed on previous arteriogram from 2014. Direct comparison with previous images  recommended if available. 2. Otherwise normal CTA of the head. CTA NECK IMPRESSION: Negative CTA of the neck. No flow-limiting or critical stenosis identified. Critical Value/emergent results were called by telephone at the time of interpretation on 12/08/2015 at 6:22 pm to Dr. Leonie Man , who verbally acknowledged these results. Electronically Signed   By: Jeannine Boga M.D.   On: 12/08/2015 19:05   Ct Head Wo Contrast  Result Date: 12/09/2015 CLINICAL DATA:  Progressive right-sided weakness. Status post revascularization of the left middle cerebral artery. EXAM: CT HEAD WITHOUT CONTRAST TECHNIQUE: Contiguous axial images were obtained from the base of the skull through the vertex without intravenous contrast. COMPARISON:  CTA head and neck 12/08/2015 FINDINGS: Brain: A developing area of hypoattenuation is present in the posterior left lentiform nucleus and extending superiorly within the centrum semi of ally. Small nonhemorrhagic cortical infarcts are present in a watershed distribution over the left convexity as well. Previously noted hyperdensity over the left hemisphere likely reflected some element of luxury perfusion. There is no evidence for subarachnoid hemorrhage on today's study. The right basal ganglia are intact. Focal calcification along the anterior horn of the left lateral ventricle is stable. Vascular: A left M1 stent extends beyond the bifurcation. No significant vascular calcifications are present otherwise. Skull: The calvarium is intact. Sinuses/Orbits: Diffuse mucosal thickening is present throughout the paranasal sinuses. There are no significant fluid levels. The mastoid air cells are clear. IMPRESSION: 1. Developing areas of hypoattenuation involving the posterior left lentiform nucleus, the centrum semi ovale, and watershed cortical infarcts without hemorrhage. These represent areas of developing infarct within the left MCA territory of tissue at highest risk. No larger MCA  territory infarct is evident. 2. Left MCA stent spanning from the proximal left M1 segment beyond the bifurcation. Electronically Signed   By: San Morelle M.D.   On: 12/09/2015 09:33   Ct Head Wo Contrast  Result Date: 12/09/2015 CLINICAL DATA:  50 y/o  M; left M1 occlusion revascularization. EXAM: CT HEAD WITHOUT CONTRAST TECHNIQUE: Contiguous axial images were obtained from the base of the skull through the vertex without intravenous contrast. COMPARISON:  12/08/2015 CT head. FINDINGS: Brain: Stable calcification in the left frontal periventricular white matter. No evidence for large territory infarct, focal mass effect, or brain parenchymal hemorrhage. Increased density within sulci in the left parietal and lateral temporal lobe in comparison with the contralateral hemisphere is probably due to vessel engorgement and hyperemia post revascularization. Less likely subarachnoid hemorrhage. Vascular: Left M1 segment stent. Skull: Normal. Negative for fracture or focal lesion. Sinuses/Orbits: Moderate diffuse paranasal sinus mucosal thickening. Normal pneumatization of mastoid air cells. Orbits are unremarkable. Other: None. IMPRESSION: 1. No infarct or brain parenchymal hemorrhage identified. 2. Increased density within sulci in the left parietal and lateral temporal lobe in comparison with the contralateral hemisphere is probably due to vessel engorgement and hyperemia post revascularization, less  likely subarachnoid hemorrhage. Electronically Signed   By: Kristine Garbe M.D.   On: 12/09/2015 00:03   Ct Angio Neck W And/or Wo Contrast  Result Date: 12/08/2015 CLINICAL DATA:  Initial evaluation for acute right-sided weakness, right-sided facial droop. EXAM: CT ANGIOGRAPHY HEAD AND NECK TECHNIQUE: Multidetector CT imaging of the head and neck was performed using the standard protocol during bolus administration of intravenous contrast. Multiplanar CT image reconstructions and MIPs were  obtained to evaluate the vascular anatomy. Carotid stenosis measurements (when applicable) are obtained utilizing NASCET criteria, using the distal internal carotid diameter as the denominator. CONTRAST:  50 cc of Isovue 370. COMPARISON:  Comparison made with prior noncontrast head CT performed earlier on the same day. FINDINGS: CTA NECK FINDINGS Aortic arch: Visualized aortic arch is of normal caliber with normal 3 vessel morphology. No high-grade stenosis at the origin of the great vessels. Visualized subclavian arteries are widely patent. Right carotid system: Right common carotid artery patent from its origin to the bifurcation. No significant atheromatous plaque about the right bifurcation. Right ICA widely patent from the bifurcation to the skullbase. No stenosis, dissection, or vascular occlusion identified within the right carotid artery system. Right external carotid artery and its branches within normal limits. Left carotid system: Left common carotid artery widely patent from its origin two the bifurcation. No significant atheromatous plaque about the left bifurcation. Left ICA widely patent from the bifurcation to the skullbase. No stenosis, dissection, or vascular occlusion within the left carotid artery system. Left external carotid artery and its branches within normal limits. Vertebral arteries: Both of the vertebral arteries arise from the subclavian arteries. Vertebral arteries widely patent within the neck without stenosis, dissection, or occlusion. Skeleton: No acute osseous abnormality. No worrisome lytic or blastic osseous lesions. Other neck: Visualized soft tissues of the neck within normal limits without acute abnormality. Thyroid normal. No adenopathy. Upper chest: Visualized upper mediastinum is normal. Visualized lungs are clear. Review of the MIP images confirms the above findings CTA HEAD FINDINGS Anterior circulation: The petrous, cavernous, and supraclinoid segments of the internal  carotid arteries are widely patent without flow-limiting stenosis. ICA termini themselves appear to be patent. A1 segments patent. Right A1 segment hypoplastic. Anterior communicating artery normal. Anterior cerebral arch well opacified to their distal aspects. On the left, there is abrupt cough of the left M1 segment, which fever reflects high-grade stenosis or possibly embolic occlusion. Area of occlusion measures approximately 7 mm. There is reconstitution distally, with attenuated flow within the distal left M1 segment and left MCA branches. Right M1 segment widely patent. Right MCA bifurcation normal. Distal right MCA branches well opacified. Posterior circulation: Vertebral arteries are patent to the vertebrobasilar junction. Mild atheromatous irregularity within the left V4 segment without high-grade stenosis. Posterior inferior cerebral arteries patent bilaterally. Basilar artery widely patent. Superior cerebral arteries patent bilaterally. Right PCA supplied via the basilar artery and is well opacified to its distal aspect. There is a fetal type left PCA supplied via a widely patent left posterior communicating artery. Venous sinuses: Grossly patent. Anatomic variants: Fetal type left PCA. No aneurysm or vascular malformation. Delayed phase: Not performed. Review of the MIP images confirms the above findings IMPRESSION: CTA HEAD IMPRESSION: 1. Abrupt occlusion of the proximal left M1 segment, with attenuated distal reconstitution. Finding may reflect a severe high-grade stenosis or possibly partially occlusive and/or recannulized thrombus. Area of occlusion measures approximately 7 mm in length. Per discussion with Dr. 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 This is not available for review or comparison at time of this dictation. While findings on this CT are felt to be within the M1 segment, it is  possible that this lesion is the same lesion discussed on previous arteriogram from 2014. Direct comparison with previous images recommended if available. 2. Otherwise normal CTA of the head. CTA NECK IMPRESSION: Negative CTA of the neck. No flow-limiting or critical stenosis identified. Critical Value/emergent results were called by telephone at the time of interpretation on 12/08/2015 at 6:22 pm to Dr. Leonie Man , who verbally acknowledged these results. Electronically Signed   By: Jeannine Boga M.D.   On: 12/08/2015 19:05   Mr Brain Wo Contrast  Result Date: 12/09/2015 CLINICAL DATA:  Initial evaluation for acute stroke, status post tPA with catheter directed revascularization and angioplasty. EXAM: MRI HEAD WITHOUT CONTRAST TECHNIQUE: Multiplanar, multiecho pulse sequences of the brain and surrounding structures were obtained without intravenous contrast. COMPARISON:  Prior CT from earlier same day as well as prior examinations from 12/08/2015. The FINDINGS: Brain: Cerebral volume normal for patient age. No significant cerebral white matter disease. There are patchy multi focal areas of restricted diffusion involving the left MCA territory, compatible with acute left MCA territory infarcts. Specifically, many of these foci are cortical and subcortical in nature, in a somewhat watershed distribution. There is confluent restricted diffusion involving the left caudate and lentiform nuclei. Associated T2/FLAIR signal abnormality with gyral edema present within the areas of infarction. No significant associated hemorrhage. Susceptibility artifact at the level of the left caudate head is consistent with parenchymal calcification at this level. Left lateral ventricle is mildly attenuated without significant mass effect. No midline shift. No other evidence for acute ischemia. Gray-white matter differentiation otherwise maintained. No other evidence for acute or chronic intracranial hemorrhage. No other areas of  chronic infarction. No mass lesion. No hydrocephalus. No extra-axial fluid collection. Major dural sinuses are grossly patent. Apparent FLAIR signal abnormality overlying the dural/posterior aspect of the brain is felt to be artifactual on this exam. Pituitary gland and suprasellar region within normal limits. Vascular: Extent susceptibility artifact within the left M1 segment due to vascular stent. Major intracranial vascular flow voids are otherwise maintained. Skull and upper cervical spine: Craniocervical junction normal. Visualized upper cervical spine unremarkable. Bone marrow signal intensity within normal limits. No scalp soft tissue abnormality. Sinuses/Orbits: Globes and orbital soft tissues within normal limits. Scattered mucosal thickening throughout the paranasal sinuses. Fluid present within the nasopharynx. Patient appears to be intubated. No mastoid effusion. Inner ear structures grossly normal. IMPRESSION: 1. Patchy multi focal acute left MCA territory infarcts, overall moderate in volume. No associated hemorrhage or significant mass effect. 2. Long segment vascular stent in place within the left M1 segment. Electronically Signed   By: Jeannine Boga M.D.   On: 12/09/2015 19:23   Portable Chest Xray  Result Date: 12/09/2015 CLINICAL DATA:  50 y/o  M; endotracheal tube placement. EXAM: PORTABLE CHEST 1 VIEW COMPARISON:  None. FINDINGS: Cardiac silhouette within normal limits. Endotracheal tube tip 5.5 cm from carina. Endotracheal tube tip below the field of the in the abdomen, proximal side hole near the gastroesophageal junction. Clear lungs. No pneumothorax or effusion. IMPRESSION: Endotracheal tube tip 5.5 cm from carina at lower clavicle level. Enteric tube tip in the abdomen with proximal side hole near the gastroesophageal junction, suggest advancement. Clear lung. Electronically Signed   By: Edgardo Roys.D.  On: 12/09/2015 00:05   Dg Abd Portable 1v  Result Date:  12/09/2015 CLINICAL DATA:  NG tube position EXAM: PORTABLE ABDOMEN - 1 VIEW COMPARISON:  None. FINDINGS: Normal small bowel gas pattern. There is NG tube with tip in distal stomach/pyloric region. IMPRESSION: NG tube with tip in distal stomach/ pyloric region. Electronically Signed   By: Lahoma Crocker M.D.   On: 12/09/2015 11:25   Dg Abd Portable 1v  Result Date: 12/09/2015 CLINICAL DATA:  50 y/o  M; nasogastric tube placement. EXAM: PORTABLE ABDOMEN - 1 VIEW COMPARISON:  None. FINDINGS: Normal bowel gas pattern. Enteric tube tip in proximal stomach with proximal side hole near the gastroesophageal junction, suggest advancement. Contrast accumulation in the kidneys from recent IV contrast administration. Mild degenerative changes of lumbar spine. IMPRESSION: Enteric tube tip in proximal stomach with proximal side hole near the gastroesophageal junction, suggest advancement. Electronically Signed   By: Kristine Garbe M.D.   On: 12/09/2015 00:06   Ct Head Code Stroke W/o Cm  Result Date: 12/08/2015 CLINICAL DATA:  Code stroke.  Right-sided weakness EXAM: CT HEAD WITHOUT CONTRAST TECHNIQUE: Contiguous axial images were obtained from the base of the skull through the vertex without intravenous contrast. COMPARISON:  CT head 03/11/2004 FINDINGS: Brain: Ventricle size normal. Cerebral volume normal. Negative for acute infarct. No significant chronic ischemic change. 5 mm dense calcification in the left anterior head of caudate unchanged from the prior study. Probably an area of chronic infection such as granulomatous disease or cysticercosis. No surrounding edema. No other calcifications. Vascular: No hyperdense vessel or unexpected calcification. Skull: Negative Sinuses/Orbits: Mild mucosal edema in the paranasal sinuses. Mastoid sinus clear. Other: None ASPECTS (Bena Stroke Program Early CT Score) - Ganglionic level infarction (caudate, lentiform nuclei, internal capsule, insula, M1-M3 cortex): 7 -  Supraganglionic infarction (M4-M6 cortex): 3 Total score (0-10 with 10 being normal): 10 IMPRESSION: 1. Negative for acute infarct. 2. Stable 5 mm calcification left head of caudate likely due to chronic infection. 3. ASPECTS is 10 These results were called by telephone at the time of interpretation on 12/08/2015 at 6:19 pm to Dr. Leonie Man, who verbally acknowledged these results. Electronically Signed   By: Franchot Gallo M.D.   On: 12/08/2015 18:19    Assessment/Plan: Diagnosis: Left MCA infarct with aphasia, apraxia, as well as right upper extremity greater than lower extremity weakness 1. Does the need for close, 24 hr/day medical supervision in concert with the patient's rehab needs make it unreasonable for this patient to be served in a less intensive setting? Yes 2. Co-Morbidities requiring supervision/potential complications: HTN 3. Due to bladder management, bowel management, safety, skin/wound care, disease management, medication administration, pain management and patient education, does the patient require 24 hr/day rehab nursing? Yes 4. Does the patient require coordinated care of a physician, rehab nurse, PT (1-2 hrs/day, 5 days/week), OT (1-2 hrs/day, 5 days/week) and SLP (.5-1 hrs/day, 5 days/week) to address physical and functional deficits in the context of the above medical diagnosis(es)? Yes Addressing deficits in the following areas: balance, endurance, locomotion, strength, transferring, bowel/bladder control, bathing, dressing, feeding, grooming, toileting, cognition, speech, language, swallowing and psychosocial support 5. Can the patient actively participate in an intensive therapy program of at least 3 hrs of therapy per day at least 5 days per week? Yes 6. The potential for patient to make measurable gains while on inpatient rehab is excellent 7. Anticipated functional outcomes upon discharge from inpatient rehab are supervision and min assist  with PT, supervision  and min assist  with OT, min assist and mod assist with SLP. 8. Estimated rehab length of stay to reach the above functional goals is: 18-22d 9. Does the patient have adequate social supports and living environment to accommodate these discharge functional goals? Yes 10. Anticipated D/C setting: Home 11. Anticipated post D/C treatments: Walker therapy 12. Overall Rehab/Functional Prognosis: excellent  RECOMMENDATIONS: This patient's condition is appropriate for continued rehabilitative care in the following setting: CIR Patient has agreed to participate in recommended program. N/A Note that insurance prior authorization may be required for reimbursement for recommended care.  Comment:     12/10/2015

## 2015-12-11 ENCOUNTER — Inpatient Hospital Stay (HOSPITAL_COMMUNITY): Payer: Self-pay

## 2015-12-11 ENCOUNTER — Encounter (HOSPITAL_COMMUNITY): Payer: Self-pay | Admitting: Interventional Radiology

## 2015-12-11 DIAGNOSIS — E876 Hypokalemia: Secondary | ICD-10-CM

## 2015-12-11 LAB — CBC WITH DIFFERENTIAL/PLATELET
Basophils Absolute: 0 10*3/uL (ref 0.0–0.1)
Basophils Relative: 0 %
EOS ABS: 0.1 10*3/uL (ref 0.0–0.7)
EOS PCT: 0 %
HCT: 40.4 % (ref 39.0–52.0)
Hemoglobin: 13.5 g/dL (ref 13.0–17.0)
LYMPHS ABS: 3.2 10*3/uL (ref 0.7–4.0)
LYMPHS PCT: 20 %
MCH: 30 pg (ref 26.0–34.0)
MCHC: 33.4 g/dL (ref 30.0–36.0)
MCV: 89.8 fL (ref 78.0–100.0)
MONOS PCT: 8 %
Monocytes Absolute: 1.2 10*3/uL — ABNORMAL HIGH (ref 0.1–1.0)
Neutro Abs: 11.8 10*3/uL — ABNORMAL HIGH (ref 1.7–7.7)
Neutrophils Relative %: 72 %
PLATELETS: 226 10*3/uL (ref 150–400)
RBC: 4.5 MIL/uL (ref 4.22–5.81)
RDW: 13.1 % (ref 11.5–15.5)
WBC: 16.3 10*3/uL — ABNORMAL HIGH (ref 4.0–10.5)

## 2015-12-11 LAB — RENAL FUNCTION PANEL
Albumin: 3.2 g/dL — ABNORMAL LOW (ref 3.5–5.0)
Anion gap: 9 (ref 5–15)
BUN: 9 mg/dL (ref 6–20)
CHLORIDE: 111 mmol/L (ref 101–111)
CO2: 21 mmol/L — AB (ref 22–32)
CREATININE: 0.68 mg/dL (ref 0.61–1.24)
Calcium: 8.7 mg/dL — ABNORMAL LOW (ref 8.9–10.3)
GFR calc Af Amer: >60 mL/min (ref >60)
GFR calc non Af Amer: >60 mL/min (ref >60)
GLUCOSE: 98 mg/dL (ref 65–99)
POTASSIUM: 3.4 mmol/L — AB (ref 3.5–5.1)
Phosphorus: 3.6 mg/dL (ref 2.5–4.6)
Sodium: 141 mmol/L (ref 135–145)

## 2015-12-11 LAB — MAGNESIUM: MAGNESIUM: 1.8 mg/dL (ref 1.7–2.4)

## 2015-12-11 MED ORDER — POTASSIUM CHLORIDE 10 MEQ/100ML IV SOLN
10.0000 meq | INTRAVENOUS | Status: AC
  Administered 2015-12-11 (×4): 10 meq via INTRAVENOUS
  Filled 2015-12-11 (×4): qty 100

## 2015-12-11 MED ORDER — PHENOL 1.4 % MT LIQD
1.0000 | OROMUCOSAL | Status: DC | PRN
  Administered 2015-12-11 (×2): 1 via OROMUCOSAL
  Filled 2015-12-11: qty 177

## 2015-12-11 NOTE — Progress Notes (Signed)
Pt tolerated placement well. Cortrak tube inserted to 93 cm in right nare. Xray ordered and tube added to assessment. Please keep tube and stylet for reinsertion if needed. Please contact cortrak team with any questions or concerns.

## 2015-12-11 NOTE — Progress Notes (Signed)
PULMONARY / CRITICAL CARE MEDICINE   Name: Nathaneil Borchers MRN: [id number] DOB: 03/24/1965    ADMISSION DATE:  12/08/2015 CONSULTATION DATE:  12/08/2015  REFERRING MD:  Dr. Nicole Kindred  CHIEF COMPLAINT:  Stroke  HISTORY OF PRESENT ILLNESS:   50 year old male with no significant past medical history. He presented to [facility] October 31 after developing acute onset right sided facial droop and right-sided hemiparesis with associated aphasia while at his daughter's school. He was brought to the emergency department as a code stroke or CT angiogram of the head showed acute occlusion of the M1 segment of the left MCA. He was given systemic TPA and was taken to interventional radiology for revascularization, which was successful. Post procedurally he was taken to the ICU for recovery where he remains on the ventilator. PCCM consult and for vent/medical management.  SUBJECTIVE:  No acute events overnight. Patient remains aphasic.  REVIEW OF SYSTEMS:  Unable to obtain given aphasia.   VITAL SIGNS: BP 100/64   Pulse 72   Temp 99.6 F (37.6 C) (Axillary)   Resp (!) 27   Ht 5\' 10"  (1.778 m)   Wt 164 lb 3.9 oz (74.5 kg)   SpO2 93%   BMI 23.57 kg/m   HEMODYNAMICS:    VENTILATOR SETTINGS:    INTAKE / OUTPUT: I/O last 3 completed shifts: In: 2952.1 [I.V.:2602.1; IV Piggyback:350] Out: 2170 [Urine:2170]  PHYSICAL EXAMINATION: General:  Awake. No distress. Brother at bedside.  Integument:  Warm & dry. No rash on exposed skin. HEENT:  No scleral injection or icterus. Moist mucus membranes.  Cardiovascular:  Regular rate and rhythm. No edema. No appreciable JVD.  Pulmonary:  Clear on auscultation. Normal work of breathing on room air. Good cough. Abdomen: Soft. Normal bowel sounds. Nondistended. Musculoskeletal:  No joint deformity or effusion appreciated. Neurological: Following commands. Has movement in bilateral extremities. Able to nod to questions.  LABS:  BMET  Recent  Labs Lab 12/09/15 0430 12/10/15 0500 12/11/15 0416  NA 140 141 141  K 3.8 3.4* 3.4*  CL 111 112* 111  CO2 23 22 21*  BUN 8 6 9   CREATININE 0.71 0.64 0.68  GLUCOSE 112* 105* 98    Electrolytes  Recent Labs Lab 12/09/15 0430 12/10/15 0500 12/11/15 0416  CALCIUM 8.0* 8.5* 8.7*  MG  --  2.0 1.8  PHOS  --  2.7 3.6    CBC  Recent Labs Lab 12/09/15 0430 12/10/15 0500 12/11/15 0416  WBC 19.7* 16.4* 16.3*  HGB 14.3 13.7 13.5  HCT 43.3 40.4 40.4  PLT 234 229 226    Coag's  Recent Labs Lab 12/08/15 1759  APTT 29  INR 0.99    Sepsis Markers  Recent Labs Lab 12/09/15 0430 12/10/15 0500  PROCALCITON <0.10 <0.10    ABG  Recent Labs Lab 12/09/15 0028  PHART 7.260*  PCO2ART 48.0  PO2ART 354.0*    Liver Enzymes  Recent Labs Lab 12/08/15 1759 12/10/15 0500 12/11/15 0416  AST 27  --   --   ALT 34  --   --   ALKPHOS 71  --   --   BILITOT 0.3  --   --   ALBUMIN 4.4 3.0* 3.2*    Cardiac Enzymes No results for input(s): TROPONINI, PROBNP in the last 168 hours.  Glucose  Recent Labs Lab 12/08/15 1750 12/09/15 1222  GLUCAP 111* 103*    Imaging No results found.   STUDIES:  CT head 10/31:  Negative for acute infarct. Stable  5 mm calcification left head of caudate   CTA head/neck 10/31: L M1 occlusion with attenuated distal reconstitution CT Head w/o 11/1:  Developing areas of hypoattenuation involving the posterior left lentiform nucleus, the centrum semi ovale, and watershed cortical infarcts without hemorrhage. These represent areas of developing infarct within the left MCA territory of tissue at highest risk. No larger MCA territory infarct is evident. Left MCA stent spanning from the proximal left M1 segment beyond the bifurcation. MRI Brain W/O 11/1:  Patchy multi focal acute left MCA territory infarcts, overall moderate in volume. No associated hemorrhage or significant mass effect. Long segment vascular stent in place within the left  M1 segment. TTE 11/2:  LV 55-60% with normal wall motion. Normal diastolic function. LA & RA normal in size. RV normal in size and function. No aortic stenosis or regurgitation. Aortic root normal in size. No mitral stenosis or regurgitation. No pulmonic stenosis. No tricuspid regurgitation. No pericardial effusion.  MICROBIOLOGY: MRSA PCR 11/1:  Negative   ANTIBIOTICS: None  SIGNIFICANT EVENTS: 10/31 - Admit w/ acute CVA tx w/ IV TPA then revascularization in IR 11/02 - self-extubated  LINES/TUBES: OETT 7.5 10/31 - 11/2 L RADIAL ART LINE 10/31 - 11/1 R FEM ART SHEATH 10/31 - 11/1 FOLEY 10/31 - 11/2 PIV x2  ASSESSMENT / PLAN:  NEUROLOGIC A:   Acute L M1 CVA - S/P systemic TPA and Neuro IR revascularization w/ embolectomy, angioplasty, & stent placement 10/31. With aphasia. Sedation on Ventilator  P:   Neurology/Neuro IR management post stroke D/C Fentanyl IV  PT/OT/Speech Therapy Consulted  CARDIOVASCULAR A:  Hypertension - New diagnosis post procedure. Intracranial Arterial Stent - Placed 10/31.  P:  Continuous telemetry monitoring Vitals per unit protocol BP Goal per Neuro & Neuro IR D/C Cardene drip Labetalol IV prn ASA 325mg  & Plavix 75mg  daily  PULMONARY A: Acute Respiratory Failure - Unable to protect airway post procedure. Self-extubated 11/2 AM. Resolving.  P:   Incentive Spirometry for pulmonary toilette Continuous Pulse Oximetry  RENAL A:   Hypokalemia - Replacing.  Hyperchloremia - Mild.  P:   Trending UOP with Foley Monitoring electrolytes & renal function daily Replacing electrolytes as indicated KCl 3mEq IV x4 runs  GASTROINTESTINAL A:   No acute issues.  P:   NPO pending speech evaluation May need NGT depending on ability to swallow  HEMATOLOGIC A:   Leukocytosis - Unclear etiology. Improving.  P:  Trending cell counts daily w/ CBC SCDs  INFECTIOUS A:   No evidence of acute infection.  P:   Monitoring off  antibiotics. Monitoring for signs of infection.  ENDOCRINE A:   No acute issues.  P:   Follow glucose on daily labs.   FAMILY  - Updates:  Daughter and wife updated 11/2 by Dr. Ashok Cordia at [place] family meet or Palliative Care meeting due by:  11/6   TODAY'S SUMMARY:  50 y.o. male suffered acute stroke with limited past medical history. Patient was treated with intravenous TPA and subsequently taken to interventional radiology for revascularization with stent placement into left middle cerebral artery. Remains aphasic but improving strength. Replacing patient's potassium with hypokalemia today. Respiratory status remains stable post self-extubation. From my standpoint patient could transition out of the intensive care unit today.  PCCM will sign off. Please let us know if we can be of any further assistance in the care of this patient.  Sonia Baller Ashok Cordia, M.D. [facility] Pager:  [phone] After 3pm  or if no response, call [phone] 12/11/2015 11:44 AM

## 2015-12-11 NOTE — Progress Notes (Signed)
Physical Therapy Treatment Patient Details Name: James Cortez MRN: [id number] DOB: 1966-01-04 Today's Date: 12/11/2015    History of Present Illness Patient is a 50 y/o male with hx of skin cancer presents with right sided weakness and aphasia. MRI-  Patchy multi focal acute left MCA territory infarcts; occlusion proximal Left M1 segment s/p tPA with catheter directed revascularization and angioplasty.    PT Comments    Patient progressing well towards PT goals. Seems to have less secretions today. Continues to be non verbal but better able to follow multi step commands requiring increased time and visual cues. Tolerated gait training with Min-Mod A for balance due to right knee instability. Appropriate for CIR. Will follow.   Follow Up Recommendations  CIR     Equipment Recommendations  Other (comment)    Recommendations for Other Services       Precautions / Restrictions Precautions Precautions: Fall Restrictions Weight Bearing Restrictions: No    Mobility  Bed Mobility Overal bed mobility: Needs Assistance Bed Mobility: Supine to Sit     Supine to sit: Min assist;HOB elevated     General bed mobility comments: Able to initiate movement to get to EOB with increased time and cues. Better able to use RUE during functional mobility. + dizziness.  Transfers Overall transfer level: Needs assistance Equipment used: 1 person hand held assist Transfers: Sit to/from Stand Sit to Stand: Min assist         General transfer comment: Assist to boost from EOB. Cues for hand placement/technique.  Ambulation/Gait Ambulation/Gait assistance: Mod assist;Min assist;+2 safety/equipment Ambulation Distance (Feet): 50 Feet Assistive device: 1 person hand held assist Gait Pattern/deviations: Step-through pattern;Decreased stride length;Decreased stance time - right Gait velocity: decreased Gait velocity interpretation: Below normal speed for age/gender General Gait Details: Slow,  guarded gait with mild instability RLE but no buckling. Able to follow directional cues.    Stairs            Wheelchair Mobility    Modified Rankin (Stroke Patients Only) Modified Rankin (Stroke Patients Only) Pre-Morbid Rankin Score: No symptoms Modified Rankin: Moderately severe disability     Balance Overall balance assessment: Needs assistance Sitting-balance support: Feet supported;No upper extremity supported Sitting balance-Leahy Scale: Fair Sitting balance - Comments: Able to perform LAQ and exercises without LOB. ABle to manipulate wash cloth and wipe face with assist using RUE.   Standing balance support: During functional activity Standing balance-Leahy Scale: Poor Standing balance comment: Able to stand with Min A for balance, right lateral lean noted. Stood at sink and brushed teeth with Min A for balance.                     Cognition Arousal/Alertness: Awake/alert Behavior During Therapy: Flat affect Overall Cognitive Status: Difficult to assess Area of Impairment: Following commands       Following Commands: Follows multi-step commands inconsistently;Follows multi-step commands with increased time     Problem Solving: Slow processing;Decreased initiation;Requires verbal cues General Comments: Non verbal throughout session. Mild right inattention noted but able to attend to right with cues. Better able to follow instructions and commands today with increased time and cues.     Exercises      General Comments General comments (skin integrity, edema, etc.): Daughter and ex wife present during session.      Pertinent Vitals/Pain Pain Assessment: Faces Faces Pain Scale: Hurts a little bit Pain Location: right hand Pain Descriptors / Indicators: Grimacing Pain Intervention(s): Monitored during session;Repositioned  Home Living                      Prior Function            PT Goals (current goals can now be found in the  care plan section) Progress towards PT goals: Progressing toward goals    Frequency    Min 4X/week      PT Plan Current plan remains appropriate    Co-evaluation PT/OT/SLP Co-Evaluation/Treatment: Yes Reason for Co-Treatment: For patient/therapist safety;Complexity of the patient's impairments (multi-system involvement)         End of Session Equipment Utilized During Treatment: Gait belt Activity Tolerance: Patient tolerated treatment well Patient left: in chair;with call bell/phone within reach;with family/visitor present     Time: [id number] PT Time Calculation (min) (ACUTE ONLY): 30 min  Charges:  $Gait Training: 8-22 mins                    G Codes:      Abagail Limb A Michaeal Davis 12/11/2015, 2:47 PM  Wray Kearns, [place], DPT [phone]

## 2015-12-11 NOTE — Evaluation (Signed)
Occupational Therapy Evaluation Patient Details Name: James Cortez MRN: [id number] DOB: 28-May-1965 Today's Date: 12/11/2015    History of Present Illness Patient is a 50 y/o male with hx of skin cancer presents with right sided weakness and aphasia. MRI-  Patchy multi focal acute left MCA territory infarcts; occlusion proximal Left M1 segment s/p tPA with catheter directed revascularization and angioplasty.   Clinical Impression   Per family, pt independent with ADL PTA. Currently pt requires mod assist +2 for functional mobility and mod-max assist for ADL. Pt presenting with decreased RUE strength/ROM, R inattention, decreased standing balance, impaired communication impacting his independence and safety with ADL and functional mobility. Pt motivated to participate in therapy with supportive family present on eval. Recommending CIR level therapies to maximize independence and safety with ADL and functional mobility prior to return home. Pt would benefit from continued skilled OT to address established goals.    Follow Up Recommendations  CIR;Supervision/Assistance - 24 hour    Equipment Recommendations  Other (comment) (TBD at next venue)    Recommendations for Other Services       Precautions / Restrictions Precautions Precautions: Fall Restrictions Weight Bearing Restrictions: No      Mobility Bed Mobility Overal bed mobility: Needs Assistance Bed Mobility: Supine to Sit     Supine to sit: Min assist;HOB elevated     General bed mobility comments: Able to initiate movement to get to EOB with increased time and cues. Better able to use RUE during functional mobility. + dizziness.  Transfers Overall transfer level: Needs assistance Equipment used: 1 person hand held assist Transfers: Sit to/from Stand Sit to Stand: Min assist;+2 safety/equipment         General transfer comment: Assist to boost from EOB. Cues for hand placement/technique.    Balance Overall  balance assessment: Needs assistance Sitting-balance support: Feet supported;No upper extremity supported Sitting balance-Leahy Scale: Fair Sitting balance - Comments: Able to perform LAQ and exercises without LOB. ABle to manipulate wash cloth and wipe face with assist using RUE.   Standing balance support: No upper extremity supported;During functional activity Standing balance-Leahy Scale: Poor Standing balance comment: Able to stand with Min A for balance, right lateral lean noted. Stood at sink and brushed teeth with mod assist for balance.                             ADL Overall ADL's : Needs assistance/impaired     Grooming: Moderate assistance;Oral care;Standing Grooming Details (indicate cue type and reason): Assist for standing balance; R lean with functional task. Assist for toothpaste but pt attempting to use RUE to participate in activity. Brushed teeth with L hand. Upper Body Bathing: Moderate assistance;Sitting   Lower Body Bathing: Maximal assistance;Sit to/from stand   Upper Body Dressing : Moderate assistance;Sitting   Lower Body Dressing: Maximal assistance;Sit to/from stand   Toilet Transfer: Moderate assistance;+2 for safety/equipment;Ambulation;BSC Toilet Transfer Details (indicate cue type and reason): Simulated by sit to stand from EOB with functional mobility in room.         Functional mobility during ADLs: Moderate assistance;+2 for safety/equipment General ADL Comments: Pt able to follow one step commands with increased time. Difficult to identify if pt with receptive deficits or does not understand command due to language barrier.     Vision Additional Comments: Pt with some R inattention. Able to turn head and attend to simulus on R side with cues. Difficult to assess  vision due to impaired communication.   Perception     Praxis      Pertinent Vitals/Pain Pain Assessment: Faces Faces Pain Scale: Hurts a little bit Pain Location: R  hand Pain Descriptors / Indicators: Grimacing Pain Intervention(s): Monitored during session;Repositioned     Hand Dominance Right   Extremity/Trunk Assessment Upper Extremity Assessment Upper Extremity Assessment: RUE deficits/detail RUE Deficits / Details: Full PROM. Limited antigravity ROM but able to hold extremity up against gravity. Decrased grip strength. Increased edema noted.   Lower Extremity Assessment Lower Extremity Assessment: Defer to PT evaluation   Cervical / Trunk Assessment Cervical / Trunk Assessment: Normal   Communication Communication Communication: Prefers language other than English;Expressive difficulties;Receptive difficulties Engineer, petroleum Spanish. Per family, able to speak/understand [place])   Cognition Arousal/Alertness: Awake/alert Behavior During Therapy: Flat affect Overall Cognitive Status: Difficult to assess Area of Impairment: Following commands;Problem solving       Following Commands: Follows multi-step commands inconsistently;Follows multi-step commands with increased time     Problem Solving: Slow processing;Decreased initiation;Requires verbal cues General Comments: Non verbal throughout session. Mild right inattention noted but able to attend to right with cues. Better able to follow instructions and commands today with increased time and cues.    General Comments       Exercises       Shoulder Instructions      Home Living Family/patient expects to be discharged to:: Private residence Living Arrangements: Children Available Help at Discharge: Family Type of Home: House                       Home Equipment: None   Additional Comments: Plan is to d/c to ex-wifes house upon d/c from [place].      Prior Functioning/Environment Level of Independence: Independent        Comments: Works in Architect; drives.         OT Problem List: Decreased strength;Decreased range of motion;Impaired balance (sitting and/or  standing);Impaired vision/perception;Decreased coordination;Decreased cognition;Decreased knowledge of use of DME or AE;Decreased knowledge of precautions;Impaired sensation;Impaired tone;Impaired UE functional use;Pain;Increased edema   OT Treatment/Interventions: Self-care/ADL training;Therapeutic exercise;Neuromuscular education;Energy conservation;DME and/or AE instruction;Therapeutic activities;Visual/perceptual remediation/compensation;Patient/family education;Balance training    OT Goals(Current goals can be found in the care plan section) Acute Rehab OT Goals Patient Stated Goal: none stated OT Goal Formulation: Patient unable to participate in goal setting Time For Goal Achievement: 12/25/15 Potential to Achieve Goals: Good ADL Goals Pt Will Perform Grooming: with min guard assist;standing Pt Will Perform Upper Body Bathing: with set-up;with supervision;sitting Pt Will Perform Lower Body Bathing: with min guard assist;sit to/from stand Pt Will Transfer to Toilet: with min guard assist;ambulating;bedside commode Pt Will Perform Toileting - Clothing Manipulation and hygiene: with min guard assist;sit to/from stand Pt/caregiver will Perform Home Exercise Program: Increased ROM;Increased strength;Right Upper extremity;Independently;With written HEP provided  OT Frequency: Min 2X/week   Barriers to D/C:            Co-evaluation PT/OT/SLP Co-Evaluation/Treatment: Yes Reason for Co-Treatment: For patient/therapist safety;Complexity of the patient's impairments (multi-system involvement)   OT goals addressed during session: ADL's and self-care      End of Session Equipment Utilized During Treatment: Gait belt Nurse Communication: Mobility status (RN observed mobility)  Activity Tolerance: Patient tolerated treatment well Patient left: in chair;with call bell/phone within reach;with chair alarm set;with family/visitor present   Time: [id number] OT Time Calculation (min): 28  min Charges:  OT General Charges $OT Visit: 1 Procedure OT Evaluation $  OT Eval Moderate Complexity: 1 Procedure G-Codes:      Binnie Kand  M.S., OTR/L Pager: [phone]  12/11/2015, 3:30 PM

## 2015-12-11 NOTE — Evaluation (Signed)
Clinical/Bedside Swallow Evaluation Patient Details  Name: Than Forsey MRN: [id number] Date of Birth: 1966-01-23  Today's Date: 12/11/2015 Time: SLP Start Time (ACUTE ONLY): 1100 SLP Stop Time (ACUTE ONLY): 1127 SLP Time Calculation (min) (ACUTE ONLY): 27 min  Past Medical History:  Past Medical History:  Diagnosis Date  . Skin cancer    Past Surgical History:  Past Surgical History:  Procedure Laterality Date  . RADIOLOGY WITH ANESTHESIA Right 12/08/2015   Procedure: RADIOLOGY WITH ANESTHESIA - CODE STROKE;  Surgeon: Medication Radiologist, MD;  Location: [facility];  Service: Radiology;  Laterality: Right;  . SKIN SURGERY     HPI:  Mr.Abdiel Floresis a 50 y.o.malewith no significant medical history presenting with right facial droop, right hemiparesis and difficulty with speech. HereceivedIV t-PA 12/08/2015 at 1822. Revascularization of L M2 with solitaire, stent assisted angioplasty and IA tPA with reocclusion leading to a TICI 2b revascularization.    Assessment / Plan / Recommendation Clinical Impression  Pt demonstrates signs of a multifactoral dysphagia secondary to three day intubation and CVA. At baseline pt is struggling to manage secretions with intermittent weak congested coughing, hoarse vocal quality.   Pt does have ability to orally manipulate and trigger a relatively timely swallow as observed with trials of puree, though this was followed by delayed coughing.  Suspect primary deficit is an acute reversible dysphagia following three day intubation and self extubation, though neurogenic deficits may also play a role. Prognosis for diet initiation in short term is favorable, but pt is not ready for PO intake or objective testing today. Suggest short term alternate nutrition with SLP to f/u for PO readiness.     Aspiration Risk  Moderate aspiration risk    Diet Recommendation NPO;Alternative means - temporary   Medication Administration: Via alternative means    Other   Recommendations Oral Care Recommendations: Oral care QID   Follow up Recommendations Inpatient Rehab      Frequency and Duration min 2x/week  2 weeks       Prognosis Prognosis for Safe Diet Advancement: Good      Swallow Study   General HPI: Mr.Daltin Floresis a 50 y.o.malewith no significant medical history presenting with right facial droop, right hemiparesis and difficulty with speech. HereceivedIV t-PA 12/08/2015 at 1822. Revascularization of L M2 with solitaire, stent assisted angioplasty and IA tPA with reocclusion leading to a TICI 2b revascularization.  Type of Study: Bedside Swallow Evaluation Previous Swallow Assessment: no Diet Prior to this Study: Thin liquids Temperature Spikes Noted: No Respiratory Status: Room air History of Recent Intubation: Yes Length of Intubations (days): 3 days Date extubated: 12/10/15 Behavior/Cognition: Alert;Requires cueing Oral Cavity - Dentition: Adequate natural dentition Vision: Functional for self-feeding Self-Feeding Abilities: Able to feed self Patient Positioning: Upright in bed Baseline Vocal Quality: Hoarse;Breathy;Low vocal intensity Volitional Cough: Weak;Congested Volitional Swallow: Unable to elicit    Oral/Motor/Sensory Function Overall Oral Motor/Sensory Function: Mild impairment Facial ROM: Reduced right Facial Symmetry: Abnormal symmetry right Facial Strength: Reduced right Lingual ROM: Within Functional Limits Lingual Symmetry: Within Functional Limits Mandible: Within Functional Limits   Ice Chips Ice chips: Not tested   Thin Liquid Thin Liquid: Not tested    Nectar Thick Nectar Thick Liquid: Not tested   Honey Thick Honey Thick Liquid: Not tested   Puree Puree: Impaired Presentation: Self Fed;Spoon Pharyngeal Phase Impairments: Wet Vocal Quality;Cough - Immediate;Throat Clearing - Immediate   Solid   GO   Solid: Not tested       Herbie Baltimore, MA  CCC-SLP [id number]  Lynann Beaver 12/11/2015,12:00 PM

## 2015-12-11 NOTE — Evaluation (Signed)
Speech Language Pathology Evaluation Patient Details Name: James Cortez MRN: [id number] DOB: February 05, 1966 Today's Date: 12/11/2015 Time: [id number] SLP Time Calculation (min) (ACUTE ONLY): 87 min  Problem List:  Patient Active Problem List   Diagnosis Date Noted  . Right hemiplegia ([place])   . Acute on chronic respiratory failure ([place])   . Essential hypertension   . CVA (cerebral vascular accident) ([place]) 12/08/2015   Past Medical History:  Past Medical History:  Diagnosis Date  . Skin cancer    Past Surgical History:  Past Surgical History:  Procedure Laterality Date  . RADIOLOGY WITH ANESTHESIA Right 12/08/2015   Procedure: RADIOLOGY WITH ANESTHESIA - CODE STROKE;  Surgeon: Medication Radiologist, MD;  Location: [facility];  Service: Radiology;  Laterality: Right;  . SKIN SURGERY     HPI:  Mr.James Floresis a 50 y.o.malewith no significant medical history presenting with right facial droop, right hemiparesis and difficulty with speech. HereceivedIV t-PA 12/08/2015 at 1822. Revascularization of L M2 with solitaire, stent assisted angioplasty and IA tPA with reocclusion leading to a TICI 2b revascularization.    Assessment / Plan / Recommendation Clinical Impression  Pt demonstrates aphasia characterized by moderate receptive deficits and severe expressive deficits. Pts function improved throughout the session as attention to midline and  To therapist improved with max verbal and visual cues. Initially pt followed single commands in less than 25% of trials unless given a contextual or visual cue. By end of session pt was able to identify line drawings correctly with 80% accuracy in a field of 8. Initially pt would only phonate on commands, but not articulate. With branching from repetition of single bilabial syllables to single syllable words to multisyllable words pt was able to repeat 'zapato' and say his name with a phonemic cue. Expect that with intesive therapeutic intervetion pt has  potential to improve. Will follow acutely for functional communcation, recommend CIR at d/c.     SLP Assessment  Patient needs continued Speech Lanaguage Pathology Services    Follow Up Recommendations  Inpatient Rehab    Frequency and Duration min 2x/week  2 weeks      SLP Evaluation Cognition  Overall Cognitive Status: Impaired/Different from baseline Arousal/Alertness: Awake/alert Orientation Level: Oriented to person;Oriented to place Attention: Focused;Sustained Focused Attention: Impaired Focused Attention Impairment: Verbal basic;Functional basic Sustained Attention: Impaired Sustained Attention Impairment: Verbal basic;Functional basic       Comprehension  Auditory Comprehension Overall Auditory Comprehension: Impaired Yes/No Questions: Impaired Basic Biographical Questions: Other (comment) (biographical - 2/2, objects i/d - 0/2) Commands: Impaired One Step Basic Commands: 50-74% accurate Visual Recognition/Discrimination Discrimination: Exceptions to [facility] Common Objects: Unable to indentify [employer] Drawings: Other (comment) (7/8 ) Reading Comprehension Reading Status: Not tested    Expression Verbal Expression Overall Verbal Expression: Impaired Initiation: Impaired Automatic Speech: Name Level of Generative/Spontaneous Verbalization: Word Repetition: Impaired Level of Impairment: Word level Naming: Impairment Responsive: Not tested Confrontation: Impaired Common Objects: Unable to indentify [employer] Drawings: Other (comment) (7/8 ) Convergent: Not tested Divergent: Not tested Interfering Components: Speech intelligibility Written Expression Dominant Hand: Right Written Expression: Not tested   Oral / Motor  Oral Motor/Sensory Function Overall Oral Motor/Sensory Function: Mild impairment Facial ROM: Reduced right Facial Symmetry: Abnormal symmetry right Facial Strength: Reduced right Lingual ROM: Within Functional Limits Lingual  Symmetry: Within Functional Limits Mandible: Within Functional Limits Motor Speech Overall Motor Speech: Impaired Respiration: Within functional limits Phonation: Breathy;Hoarse;Low vocal intensity Resonance: Within functional limits Articulation: Impaired Level of Impairment: Word Intelligibility: Intelligibility reduced  Word: 0-24% accurate   GO                   Herbie Baltimore, MA CCC-SLP [id number]  Lynann Beaver 12/11/2015, 12:25 PM

## 2015-12-11 NOTE — Progress Notes (Signed)
STROKE TEAM PROGRESS NOTE   HISTORY OF PRESENT ILLNESS (per record) James Cortez is an 50 y.o. male with no known risk factors for stroke other than tobacco, presenting with acute onset of right facial droop, right hemiparesis and difficulty with speech. Patient was last known well at 5:23 PM today 12/08/2015 ([facility]). He has no history of stroke nor TIA. He has not been on antiplatelet therapy. CT scan of the head showed no acute  intracranial abnormality. CT angiogram showed acute occlusion of the M1 segment of the left middle cerebral artery. Patient was deemed a candidate for TPA. Initial NIH stroke score was 14. PA was administered and patient showed significant improvement. NIH stroke score went from 14->5. Patient was taken to interventional radiology Cerebral angiogram showed persistent occlusion of left MCA, M1 segment. Endovascular revascularization was elected. Had L MCA occlusion s/p TICI2b revascularization with solitaire, stent assisted angioplasty and IA tPA.  He was admitted to the neuro ICU for further evaluation and treatment.   SUBJECTIVE (INTERVAL HISTORY) Patient is aphasic and has right hemiplegia. Blood pressure is adequately controlled.  He is struggling with oral secretions. OBJECTIVE Temp:  [97.8 F (36.6 C)-100.7 F (38.2 C)] 97.8 F (36.6 C) (11/03 1220) Pulse Rate:  [70-85] 71 (11/03 1400) Cardiac Rhythm: Normal sinus rhythm (11/03 0000) Resp:  [20-30] 25 (11/03 1400) BP: (99-116)/(58-69) 104/59 (11/03 1400) SpO2:  [91 %-100 %] 100 % (11/03 1400)  CBC:   Recent Labs Lab 12/10/15 0500 12/11/15 0416  WBC 16.4* 16.3*  NEUTROABS 11.6* 11.8*  HGB 13.7 13.5  HCT 40.4 40.4  MCV 88.6 89.8  PLT 229 [id number]    Basic Metabolic Panel:   Recent Labs Lab 12/10/15 0500 12/11/15 0416  NA 141 141  K 3.4* 3.4*  CL 112* 111  CO2 22 21*  GLUCOSE 105* 98  BUN 6 9  CREATININE 0.64 0.68  CALCIUM 8.5* 8.7*  MG 2.0 1.8  PHOS 2.7 3.6    Lipid Panel:     Component Value  Date/Time   CHOL 159 12/09/2015 0430   TRIG 170 (H) 12/10/2015 0500   HDL 24 (L) 12/09/2015 0430   CHOLHDL 6.6 12/09/2015 0430   VLDL 48 (H) 12/09/2015 0430   LDLCALC 87 12/09/2015 0430   HgbA1c:  Lab Results  Component Value Date   HGBA1C 5.6 12/09/2015   Urine Drug Screen: No results found for: LABOPIA, COCAINSCRNUR, LABBENZ, AMPHETMU, THCU, LABBARB    IMAGING  Mr Brain Wo Contrast  Result Date: 12/09/2015 CLINICAL DATA:  Initial evaluation for acute stroke, status post tPA with catheter directed revascularization and angioplasty. EXAM: MRI HEAD WITHOUT CONTRAST TECHNIQUE: Multiplanar, multiecho pulse sequences of the brain and surrounding structures were obtained without intravenous contrast. COMPARISON:  Prior CT from earlier same day as well as prior examinations from 12/08/2015. The FINDINGS: Brain: Cerebral volume normal for patient age. No significant cerebral white matter disease. There are patchy multi focal areas of restricted diffusion involving the left MCA territory, compatible with acute left MCA territory infarcts. Specifically, many of these foci are cortical and subcortical in nature, in a somewhat watershed distribution. There is confluent restricted diffusion involving the left caudate and lentiform nuclei. Associated T2/FLAIR signal abnormality with gyral edema present within the areas of infarction. No significant associated hemorrhage. Susceptibility artifact at the level of the left caudate head is consistent with parenchymal calcification at this level. Left lateral ventricle is mildly attenuated without significant mass effect. No midline shift. No other evidence for acute ischemia.  Gray-white matter differentiation otherwise maintained. No other evidence for acute or chronic intracranial hemorrhage. No other areas of chronic infarction. No mass lesion. No hydrocephalus. No extra-axial fluid collection. Major dural sinuses are grossly patent. Apparent FLAIR signal  abnormality overlying the dural/posterior aspect of the brain is felt to be artifactual on this exam. Pituitary gland and suprasellar region within normal limits. Vascular: Extent susceptibility artifact within the left M1 segment due to vascular stent. Major intracranial vascular flow voids are otherwise maintained. Skull and upper cervical spine: Craniocervical junction normal. Visualized upper cervical spine unremarkable. Bone marrow signal intensity within normal limits. No scalp soft tissue abnormality. Sinuses/Orbits: Globes and orbital soft tissues within normal limits. Scattered mucosal thickening throughout the paranasal sinuses. Fluid present within the nasopharynx. Patient appears to be intubated. No mastoid effusion. Inner ear structures grossly normal. IMPRESSION: 1. Patchy multi focal acute left MCA territory infarcts, overall moderate in volume. No associated hemorrhage or significant mass effect. 2. Long segment vascular stent in place within the left M1 segment. Electronically Signed   By: Jeannine Boga M.D.   On: 12/09/2015 19:23   Cerebral Angiography 12/08/2015 S/P lt common carotid arteriogram,followed by by complete revascularization of occluded Lt MCA m1 , with x pass with 4 mm x 40 mm solitaire retrieval device and stent assisted angioplasty of severe underlying stenosis with reocclusion achieving a TICI 2 b revascularization Also received 8 mg of superselective IA tpa.   PHYSICAL EXAM Middle aged hispanic male . Marland Kitchen Afebrile. Head is nontraumatic. Neck is supple without bruit.    Cardiac exam no murmur or gallop. Lungs are clear to auscultation. Distal pulses are well felt. Neurological Exam :  Awake alert expressive aphasia.follows simple commands. Left gaze preference. Mild right gaze palsy. Pupils 4 mm reactive. Fundi not visualized.corneals present. Cough and gag poor . Mild right lower facial weakness. Tongue midline. Patient can move left upper and lower extremity  spontaneously and against gravity. Patient has right hemiparesis with grade 2/3 strength on right.. Right plantar upgoing left downgoing. Tone is diminished on the right compared to the left. Gait was not tested. ASSESSMENT/PLAN [name] is a 50 y.o. male with no significant medical history presenting with right facial droop, right hemiparesis and difficulty with speech. He received IV t-PA 12/08/2015 at 1822. 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 Infarct secondary to large vessel disease source  Initial CT normal, ASPECTS 10  CTA head L M1 occlusion w/ distal reconstitution  CTA neck negative  Post IR CT no infarct or hmg. Increased density L parietal and lateral temporal lobe  CT head next am developing hypoattenuation posteior L leniform nucleus, CSV and watershed cortical infarcts without hmg. L MCA M1 stent beyond bifurcation MRI  1. Patchy multi focal acute left MCA territory infarcts, overall moderate in volume. No associated hemorrhage or significant mass effect. 2. Long segment vascular stent in place within the left M1 segment.     2D Echo  pending   LDL 87  HgbA1c 5.6  SCDs for VTE prophylaxis Diet NPO time specified Diet NPO time specified  No antithrombotic prior to admission, now on aspirin 325 mg daily and clopidogrel 75 mg daily following a 300 mg plavix load  Ongoing aggressive stroke risk factor  management  Therapy recommendations:  pending   Disposition:  pending   Acute respiratory failure  Intubated for neurointervention  CCM onboard  Blood Pressure  Goal per IR  No hx HTN, no home meds  Hyperlipidemia  Home meds:  No statin   LDL 87, goal < 70  Add statin once able to swallow  or alternative feeding method in place  Other Stroke Risk Factors  Cigarette smoker, advised to stop smoking  ETOH use, advised to drink no more than 2 drink(s) a day  Other Active Problems  Leukocytosis 19.7, etiology unclear (CXR and UA ok)  Hospital day # 3     I have personally examined this patient, reviewed notes, independently viewed imaging studies, participated in medical decision making and plan of care.ROS completed by me personally and pertinent positives fully documented  I have made any additions or clarifications directly to the above note.   . Patient presented with left middle cerebral artery occlusion and underwent endovascular mechanical embolectomy with left MCA angioplasty and stent placement for underlying stenosis   Strict control of blood pressure. Aspirin and Plavix due to intracranial stent. Discussed with Dr. Ashok Cordia, wife and daughter.Patient may need feeding tube if fails swallow evaluation. I also had a long discussion with the patient's wife and daughter who interpreted for patient's wife who speaks limited English about patient's prognosis, plan for treatment and answered questions. This patient is critically ill and at significant risk of neurological worsening, death and care requires constant monitoring of vital signs, hemodynamics,respiratory and cardiac monitoring, extensive review of multiple databases, frequent neurological assessment, discussion with family, other specialists and medical decision making of high complexity.I have made any additions or clarifications directly to the above note.This critical care time does not reflect procedure time, or teaching time or supervisory time of PA/NP/Med Resident etc but could involve care discussion time.  I spent 32 minutes of neurocritical care time  in the care of  this patient.      Antony Contras, MD Medical Director [facility] Pager: [phone] 12/11/2015 3:18 PM To contact Stroke  Continuity provider, please refer to [email or web address] After hours, contact General Neurology

## 2015-12-11 NOTE — Progress Notes (Signed)
I met with pt's significant other and his 50 yo daughter translating our brief discussion of an inpt rehab admission. Monica, significant other, plans for pt to go home with her at discharge so that he will have caregiver support. I will follow up Monday to assist with planning admit when pt medically ready, [phone]

## 2015-12-12 ENCOUNTER — Inpatient Hospital Stay (HOSPITAL_COMMUNITY): Payer: Self-pay

## 2015-12-12 LAB — GLUCOSE, CAPILLARY
GLUCOSE-CAPILLARY: 111 mg/dL — AB (ref 65–99)
Glucose-Capillary: 108 mg/dL — ABNORMAL HIGH (ref 65–99)

## 2015-12-12 LAB — RENAL FUNCTION PANEL
Albumin: 3 g/dL — ABNORMAL LOW (ref 3.5–5.0)
Anion gap: 9 (ref 5–15)
BUN: 11 mg/dL (ref 6–20)
CO2: 21 mmol/L — ABNORMAL LOW (ref 22–32)
Calcium: 8.7 mg/dL — ABNORMAL LOW (ref 8.9–10.3)
Chloride: 110 mmol/L (ref 101–111)
Creatinine, Ser: 0.61 mg/dL (ref 0.61–1.24)
GFR calc Af Amer: >60 mL/min
GFR calc non Af Amer: >60 mL/min
Glucose, Bld: 86 mg/dL (ref 65–99)
Phosphorus: 3.4 mg/dL (ref 2.5–4.6)
Potassium: 3.6 mmol/L (ref 3.5–5.1)
Sodium: 140 mmol/L (ref 135–145)

## 2015-12-12 LAB — CBC WITH DIFFERENTIAL/PLATELET
Basophils Absolute: 0 K/uL (ref 0.0–0.1)
Basophils Relative: 0 %
Eosinophils Absolute: 0.2 K/uL (ref 0.0–0.7)
Eosinophils Relative: 1 %
HCT: 40.7 % (ref 39.0–52.0)
Hemoglobin: 13.7 g/dL (ref 13.0–17.0)
Lymphocytes Relative: 23 %
Lymphs Abs: 3.3 K/uL (ref 0.7–4.0)
MCH: 30 pg (ref 26.0–34.0)
MCHC: 33.7 g/dL (ref 30.0–36.0)
MCV: 89.3 fL (ref 78.0–100.0)
Monocytes Absolute: 1.1 K/uL — ABNORMAL HIGH (ref 0.1–1.0)
Monocytes Relative: 8 %
Neutro Abs: 9.8 K/uL — ABNORMAL HIGH (ref 1.7–7.7)
Neutrophils Relative %: 68 %
Platelets: 254 K/uL (ref 150–400)
RBC: 4.56 MIL/uL (ref 4.22–5.81)
RDW: 12.9 % (ref 11.5–15.5)
WBC: 14.5 K/uL — ABNORMAL HIGH (ref 4.0–10.5)

## 2015-12-12 LAB — MAGNESIUM: MAGNESIUM: 1.9 mg/dL (ref 1.7–2.4)

## 2015-12-12 MED ORDER — ATORVASTATIN CALCIUM 20 MG PO TABS
20.0000 mg | ORAL_TABLET | Freq: Every day | ORAL | Status: DC
  Administered 2015-12-12: 20 mg via ORAL
  Filled 2015-12-12: qty 1

## 2015-12-12 MED ORDER — JEVITY 1.2 CAL PO LIQD: 1000.0000 mL | ORAL | Status: DC

## 2015-12-12 MED ORDER — JEVITY 1.5 CAL/FIBER PO LIQD
1000.0000 mL | ORAL | Status: DC
  Administered 2015-12-12: 1000 mL
  Filled 2015-12-12 (×4): qty 1000

## 2015-12-12 NOTE — Progress Notes (Signed)
Speech Language Pathology Treatment: Dysphagia  Patient Details Name: James Cortez MRN: [id number] DOB: 01/30/1966 Today's Date: 12/12/2015 Time: [id number] SLP Time Calculation (min) (ACUTE ONLY): 25 min  Assessment / Plan / Recommendation Clinical Impression  Patient seen at bedside with daughter present for PO trials of puree solids, ice chips and teaspoon sips of thin liquids. Patient exhibited multiple swallows with all PO's, decreased oral manipulation, mastication and swallow initiation of puree solids. He did not exhibit any overt s/s of aspiration, but as patient was fairly non-vocal, cannot r/o silent aspiration at this time.   HPI HPI: James Floresis a 50 y.o.malewith no significant medical history presenting with right facial droop, right hemiparesis and difficulty with speech. HereceivedIV t-PA 12/08/2015 at 1822. Revascularization of L M2 with solitaire, stent assisted angioplasty and IA tPA with reocclusion leading to a TICI 2b revascularization.       SLP Plan  MBS     Recommendations  Diet recommendations: NPO Medication Administration: Via alternative means                Plan: [place], MA, CCC-SLP 12/12/15 12:09 PM

## 2015-12-12 NOTE — Progress Notes (Signed)
STROKE TEAM PROGRESS NOTE   HISTORY OF PRESENT ILLNESS (per record) James Cortez is an 50 y.o. male with no known risk factors for stroke other than tobacco, presenting with acute onset of right facial droop, right hemiparesis and difficulty with speech. Patient was last known well at 5:23 PM today 12/08/2015 (LKW). He has no history of stroke nor TIA. He has not been on antiplatelet therapy. CT scan of the head showed no acute  intracranial abnormality. CT angiogram showed acute occlusion of the M1 segment of the left middle cerebral artery. Patient was deemed a candidate for TPA. Initial NIH stroke score was 14. TPA was administered and patient showed significant improvement. NIH stroke score went from 14->5. Patient was taken to interventional radiology Cerebral angiogram showed persistent occlusion of left MCA, M1 segment. Endovascular revascularization was elected. Had L MCA occlusion s/p TICI2b revascularization with solitaire, stent assisted angioplasty and IA tPA.  He was admitted to the neuro ICU for further evaluation and treatment.   SUBJECTIVE (INTERVAL HISTORY) Patient resting quietly.  Wife and daughter at the bedside.  Daughter acted as [name] for questions and neurologic exam   OBJECTIVE Temp:  [97.7 F (36.5 C)-99.6 F (37.6 C)] 98.1 F (36.7 C) (11/04 0700) Pulse Rate:  [69-77] 74 (11/04 0700) Cardiac Rhythm: Normal sinus rhythm (11/04 0700) Resp:  [20-27] 27 (11/04 0700) BP: (103-114)/(58-71) 106/58 (11/04 0700) SpO2:  [91 %-100 %] 95 % (11/04 0700)  CBC:   Recent Labs Lab 12/11/15 0416 12/12/15 0252  WBC 16.3* 14.5*  NEUTROABS 11.8* 9.8*  HGB 13.5 13.7  HCT 40.4 40.7  MCV 89.8 89.3  PLT 226 0000000    Basic Metabolic Panel:   Recent Labs Lab 12/11/15 0416 12/12/15 0252  NA 141 140  K 3.4* 3.6  CL 111 110  CO2 21* 21*  GLUCOSE 98 86  BUN 9 11  CREATININE 0.68 0.61  CALCIUM 8.7* 8.7*  MG 1.8 1.9  PHOS 3.6 3.4    Lipid Panel:     Component Value  Date/Time   CHOL 159 12/09/2015 0430   TRIG 170 (H) 12/10/2015 0500   HDL 24 (L) 12/09/2015 0430   CHOLHDL 6.6 12/09/2015 0430   VLDL 48 (H) 12/09/2015 0430   LDLCALC 87 12/09/2015 0430   HgbA1c:  Lab Results  Component Value Date   HGBA1C 5.6 12/09/2015   Urine Drug Screen: No results found for: LABOPIA, COCAINSCRNUR, LABBENZ, AMPHETMU, THCU, LABBARB    IMAGING  Dg Abd Portable 1v 12/11/2015 Feeding tube tip overlies the third portion of the duodenum    Cerebral Angiography 12/08/2015 S/P lt common carotid arteriogram,followed by by complete revascularization of occluded Lt MCA m1, with x pass with 4 mm x 40 mm solitaire retrieval device and stent assisted angioplasty of severe underlying stenosis with reocclusion achieving a TICI 2 b revascularization Also received 8 mg of superselective IA tpa.   MRI Brain Wo Contrast 12/09/2015 1. Patchy multi focal acute left MCA territory infarcts, overall moderate in volume. No associated hemorrhage or significant mass effect. 2. Long segment vascular stent in place within the left M1 segment.    CT Head Wo Contrast 12/09/2015 1. Developing areas of hypoattenuation involving the posterior left lentiform nucleus, the centrum semi ovale, and watershed cortical infarcts without hemorrhage. These represent areas of developing infarct within the left MCA territory of tissue at highest risk. No larger MCA territory infarct is evident. 2. Left MCA stent spanning from the proximal left M1 segment beyond the bifurcation.  CTA Head and Neck 12/08/2015  Head 1. Abrupt occlusion of the proximal left M1 segment, with attenuated distal reconstitution. Finding may reflect a severe high-grade stenosis or possibly partially occlusive and/or recannulized thrombus. Area of occlusion measures approximately 7 mm in length. Per discussion with Dr. 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 This is not available for review or comparison at time of this dictation. While findings on this CT are felt to be within the M1 segment, it is possible that this lesion is the same lesion discussed on previous arteriogram from 2014. Direct comparison with previous images recommended if available. 2. Otherwise normal CTA of the head.  Neck Negative CTA of the neck. No flow-limiting or critical stenosis identified.   Echocardiogram 12/10/2015  Study Conclusions - Left ventricle: The cavity size was normal. Systolic function was   normal. The estimated ejection fraction was in the range of 55%   to 60%. Wall motion was normal; there were no regional wall   motion abnormalities. Left ventricular diastolic function   parameters were normal.    PHYSICAL EXAM Middle aged hispanic male who is intubated and sedated. . Afebrile. Head is nontraumatic. Neck is supple without bruit.    Cardiac exam no murmur or gallop. Lungs are clear to auscultation. Distal pulses are well felt. Neurological Exam :  Awake alert expressive aphasia.follows simple commands. Left gaze preference. Mild right gaze palsy. Pupils 4 mm reactive. Fundi not visualized.corneals present. Cough and gag poor . Mild right lower facial weakness. Tongue midline. Patient can move left upper and lower extremity spontaneously and against gravity. Patient has right hemiparesis with grade 2/3 strength on right.. Right plantar upgoing left downgoing. Tone is diminished on the right compared to the left. Gait was not tested.   ASSESSMENT/PLAN James Cortez is a 50 y.o. male with no significant medical history presenting with right facial droop, right hemiparesis and difficulty with speech. He received IV t-PA 12/08/2015 at 1822. 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 Infarct secondary to large vessel disease source  Initial CT normal, ASPECTS 10  CTA head L M1 occlusion w/ distal reconstitution  CTA neck negative   Post IR CT no infarct or hmg. Increased density L parietal and lateral temporal lobe.  MRI - Patchy multi focal acute left MCA territory infarcts. Stent within the left M1 segment.  2D Echo - EF 55-60%. No cardiac source of emboli identified.  LDL 87   HgbA1c 5.6  SCDs for VTE prophylaxis DIET - DYS 1 Room service appropriate? Yes; Fluid consistency: Nectar Thick  No antithrombotic prior to admission, now on aspirin 325 mg daily and clopidogrel 75 mg daily following a 300 mg plavix load  Ongoing aggressive stroke risk factor management  Therapy recommendations:  CIR recommended  Disposition:  pending   Acute respiratory failure  Intubated for neurointervention; now extubated  CCM onboard  Blood Pressure  Goal per IR  No hx HTN, no home meds  Hyperlipidemia  Home meds:  No statin   LDL 87, goal < 70  Add Lipitor 20 mg daily  Other Stroke Risk Factors  Cigarette smoker, advised to stop smoking  ETOH use, advised to drink no more than 2 drink(s) a day  Other Active Problems  Leukocytosis 19.7, etiology unclear (CXR and UA ok) -> 14.5 currently afebrile -> repeat CBC in a.m.  Hospital day # 4  CRITICAL CARE NEUROLOGY ATTENDING NOTE Patient was seen and examined by me personally. I independently viewed imaging studies, participated in medical decision making and plan of care. The laboratory and radiographic studies were personally reviewed by me.  ROS limited by lethargy and pertinent positives could not be fully documented due to LOC  Assessment and plan completed by me personally and fully documented above.    If tolerated will begin to mobilize patient more in the  AM  Continue to monitor for cerebral edema  Continue to monitor leukocytosis  Condition is unchanged   This patient is critically ill and at significant risk of neurological worsening, death and care requires constant monitoring of vital signs, hemodynamics,respiratory and cardiac monitoring, extensive review of multiple databases, frequent neurological assessment, discussion with family, other specialists and medical decision making of high complexity.  This critical care time does not reflect procedure time, or teaching time or supervisory time of PA/NP/Med Resident etc. but could involve care discussion time.  I spent 30 minutes of Neurocritical Care time in the care of  this patient.  SIGNED BY: Dr. Elissa Hefty       To contact Stroke Continuity provider, please refer to [email or web address] After hours, contact General Neurology

## 2015-12-12 NOTE — Progress Notes (Addendum)
Nutrition Follow-up  DOCUMENTATION CODES:  Not applicable  INTERVENTION:  Initiate Calorie Count x 48 hrs.   Nocturnal TF to supplement PO intake.   Jevity 1.5 @ 75 over course of 12 hrs - 8 pm-8am   First day, Begin TF at 40 ml/hr and increase by 10 ml/2 hrs to goal of 75 ml/hr. Second day begin at goal   Tube feeding regimen provides 1350 kcal (75% of needs), 57 grams of protein, and 684 ml of H2O.   NUTRITION DIAGNOSIS:  Predicted suboptimal nutrient intake related to dysphagia as evidenced by Being on Puree/ NTL diet.  GOAL:  Patient will meet greater than or equal to 90% of their needs  MONITOR:  PO intake, Diet advancement, Labs, Weight trends, TF tolerance  REASON FOR ASSESSMENT:  Consult Enteral/tube feeding initiation and management  ASSESSMENT:  50 y/o male, no significant PMHx. Presented to [facility] October [facility] after developing acute onset right sided facial droop and right-sided hemiparesis with associated aphasia.  CT angiogram of the head showed acute occlusion of the M1 segment of the left MCA. S/P revascularization  Interim significant events 11/2- self extubated  11/3-Failed swallow evaluation: ST recommends short term alternate means of nutrition. Cortrak Placed in afternoon 11/4-RD consulted for TF initiation  Cortrak tube is postpyloric w/ tip overlying 3rd portion of Duodenum.    - When pt seen this morning, pt had MBS planned, decided to hold placing TF orders until after  - Pt passed MBS and was placed on below diet. RD Touched base with RN about if MD still would like TF to be started.   - RN reports that the PA asked for TF to be started as a supplemental means of nutrition. Asked for a calorie count to be started as well. RN will place envelope on pts door.   Will use TF to meet 50-75% needs.   Labs: Albumin: 3.0, WBC: 14.5, TG: 170, CBGs 85-110. A1C on 11/1 was 5.6 Medications: NS IVF   Recent Labs Lab 12/10/15 0500  12/11/15 0416 12/12/15 0252  NA 141 141 140  K 3.4* 3.4* 3.6  CL 112* 111 110  CO2 22 21* 21*  BUN 6 9 11   CREATININE 0.64 0.68 0.61  CALCIUM 8.5* 8.7* 8.7*  MG 2.0 1.8 1.9  PHOS 2.7 3.6 3.4  GLUCOSE 105* 98 86   Diet Order:  DIET - DYS 1 Room service appropriate? Yes; Fluid consistency: Nectar Thick  Skin: Dry  Last BM: None since admit  Height:  Ht Readings from Last 1 Encounters:  12/09/15 5\' 10"  (1.778 m)   Weight:  Wt Readings from Last 1 Encounters:  12/09/15 164 lb 3.9 oz (74.5 kg)   Wt Readings from Last 10 Encounters:  12/09/15 164 lb 3.9 oz (74.5 kg)  01/09/15 170 lb (77.1 kg)  03/17/13 166 lb (75.3 kg)   Ideal Body Weight:  75.5 kg  BMI:  Body mass index is 23.57 kg/m.  Estimated Nutritional Needs:  Kcal:  1850-2100 (25-28 kcal/kg bw) Protein:  75-90 g (1-1.2 g/kg bw) Fluid:  >2.1 L/day  EDUCATION NEEDS:  No education needs identified at this time  Burtis Junes RD, LDN, [facility] Pager: [phone] 12/12/2015 5:06 PM

## 2015-12-13 LAB — RENAL FUNCTION PANEL
ANION GAP: 5 (ref 5–15)
Albumin: 2.9 g/dL — ABNORMAL LOW (ref 3.5–5.0)
BUN: 10 mg/dL (ref 6–20)
CALCIUM: 8.5 mg/dL — AB (ref 8.9–10.3)
CHLORIDE: 109 mmol/L (ref 101–111)
CO2: 24 mmol/L (ref 22–32)
CREATININE: 0.61 mg/dL (ref 0.61–1.24)
Glucose, Bld: 118 mg/dL — ABNORMAL HIGH (ref 65–99)
Phosphorus: 3.5 mg/dL (ref 2.5–4.6)
Potassium: 3.4 mmol/L — ABNORMAL LOW (ref 3.5–5.1)
SODIUM: 138 mmol/L (ref 135–145)

## 2015-12-13 LAB — CBC WITH DIFFERENTIAL/PLATELET
BASOS ABS: 0 10*3/uL (ref 0.0–0.1)
Basophils Relative: 0 %
EOS PCT: 2 %
Eosinophils Absolute: 0.2 10*3/uL (ref 0.0–0.7)
HCT: 40.3 % (ref 39.0–52.0)
Hemoglobin: 13.8 g/dL (ref 13.0–17.0)
LYMPHS PCT: 25 %
Lymphs Abs: 3 10*3/uL (ref 0.7–4.0)
MCH: 30 pg (ref 26.0–34.0)
MCHC: 34.2 g/dL (ref 30.0–36.0)
MCV: 87.6 fL (ref 78.0–100.0)
Monocytes Absolute: 1.1 10*3/uL — ABNORMAL HIGH (ref 0.1–1.0)
Monocytes Relative: 10 %
Neutro Abs: 7.5 10*3/uL (ref 1.7–7.7)
Neutrophils Relative %: 63 %
PLATELETS: 290 10*3/uL (ref 150–400)
RBC: 4.6 MIL/uL (ref 4.22–5.81)
RDW: 12.5 % (ref 11.5–15.5)
WBC: 11.9 10*3/uL — AB (ref 4.0–10.5)

## 2015-12-13 LAB — GLUCOSE, CAPILLARY
GLUCOSE-CAPILLARY: 98 mg/dL (ref 65–99)
GLUCOSE-CAPILLARY: 124 mg/dL — AB (ref 65–99)
Glucose-Capillary: 140 mg/dL — ABNORMAL HIGH (ref 65–99)
Glucose-Capillary: 142 mg/dL — ABNORMAL HIGH (ref 65–99)
Glucose-Capillary: 109 mg/dL — ABNORMAL HIGH (ref 65–99)
Glucose-Capillary: 115 mg/dL — ABNORMAL HIGH (ref 65–99)

## 2015-12-13 LAB — COMPREHENSIVE METABOLIC PANEL
ALBUMIN: 2.9 g/dL — AB (ref 3.5–5.0)
ALT: 45 U/L (ref 17–63)
AST: 32 U/L (ref 15–41)
Alkaline Phosphatase: 52 U/L (ref 38–126)
Anion gap: 8 (ref 5–15)
BILIRUBIN TOTAL: 0.7 mg/dL (ref 0.3–1.2)
BUN: 10 mg/dL (ref 6–20)
CHLORIDE: 108 mmol/L (ref 101–111)
CO2: 22 mmol/L (ref 22–32)
Calcium: 8.4 mg/dL — ABNORMAL LOW (ref 8.9–10.3)
Creatinine, Ser: 0.61 mg/dL (ref 0.61–1.24)
GFR calc Af Amer: >60 mL/min (ref >60)
GFR calc non Af Amer: >60 mL/min (ref >60)
GLUCOSE: 118 mg/dL — AB (ref 65–99)
POTASSIUM: 3.5 mmol/L (ref 3.5–5.1)
SODIUM: 138 mmol/L (ref 135–145)
Total Protein: 5.8 g/dL — ABNORMAL LOW (ref 6.5–8.1)

## 2015-12-13 LAB — MAGNESIUM: MAGNESIUM: 1.9 mg/dL (ref 1.7–2.4)

## 2015-12-13 MED ORDER — SODIUM CHLORIDE 0.9 % IV SOLN
30.0000 meq | Freq: Once | INTRAVENOUS | Status: AC
  Administered 2015-12-13: 30 meq via INTRAVENOUS
  Filled 2015-12-13: qty 15

## 2015-12-13 MED ORDER — ATORVASTATIN CALCIUM 40 MG PO TABS
40.0000 mg | ORAL_TABLET | Freq: Every day | ORAL | Status: DC
  Administered 2015-12-13 – 2015-12-14 (×2): 40 mg via ORAL
  Filled 2015-12-13 (×2): qty 1

## 2015-12-13 MED ORDER — STARCH (THICKENING) PO POWD
ORAL | Status: DC | PRN
  Filled 2015-12-13: qty 227

## 2015-12-13 MED ORDER — ASPIRIN EC 325 MG PO TBEC
325.0000 mg | DELAYED_RELEASE_TABLET | Freq: Every day | ORAL | Status: DC
  Administered 2015-12-14 – 2015-12-15 (×2): 325 mg via ORAL
  Filled 2015-12-13 (×2): qty 1

## 2015-12-13 NOTE — Progress Notes (Signed)
STROKE TEAM PROGRESS NOTE   HISTORY OF PRESENT ILLNESS (per record) James Cortez is an 50 y.o. male with no known risk factors for stroke other than tobacco, presenting with acute onset of right facial droop, right hemiparesis and difficulty with speech. Patient was last known well at 5:23 PM today 12/08/2015 (LKW). He has no history of stroke nor TIA. He has not been on antiplatelet therapy. CT scan of the head showed no acute  intracranial abnormality. CT angiogram showed acute occlusion of the M1 segment of the left middle cerebral artery. Patient was deemed a candidate for TPA. Initial NIH stroke score was 14. TPA was administered and patient showed significant improvement. NIH stroke score went from 14->5. Patient was taken to interventional radiology Cerebral angiogram showed persistent occlusion of left MCA, M1 segment. Endovascular revascularization was elected. Had L MCA occlusion s/p TICI2b revascularization with solitaire, stent assisted angioplasty and IA tPA.  He was admitted to the neuro ICU for further evaluation and treatment.   SUBJECTIVE (INTERVAL HISTORY) Patient resting quietly.  He was easier to awaken this morning  Wife and daughter at the bedside.  Daughter acted as [name] for questions and neurologic exam.  Family reports he ate a good portion of his breakfast.   OBJECTIVE Temp:  [97.9 F (36.6 C)-98.2 F (36.8 C)] 98.1 F (36.7 C) (11/05 0700) Pulse Rate:  [63-75] 63 (11/05 0700) Cardiac Rhythm: Normal sinus rhythm (11/05 0700) Resp:  [16-27] 24 (11/05 0700) BP: (104-134)/(67-77) 104/67 (11/05 0700) SpO2:  [91 %-96 %] 95 % (11/05 0700) Weight:  [77.5 kg (170 lb 13.7 oz)] 77.5 kg (170 lb 13.7 oz) (11/05 0300)  CBC:   Recent Labs Lab 12/12/15 0252 12/13/15 0428  WBC 14.5* 11.9*  NEUTROABS 9.8* 7.5  HGB 13.7 13.8  HCT 40.7 40.3  MCV 89.3 87.6  PLT 254 [id number]    Basic Metabolic Panel:   Recent Labs Lab 12/12/15 0252 12/13/15 0428  NA 140 138  138  K  3.6 3.5  3.4*  CL 110 108  109  CO2 21* 22  24  GLUCOSE 86 118*  118*  BUN 11 10  10   CREATININE 0.61 0.61  0.61  CALCIUM 8.7* 8.4*  8.5*  MG 1.9 1.9  PHOS 3.4 3.5    Lipid Panel:     Component Value Date/Time   CHOL 159 12/09/2015 0430   TRIG 170 (H) 12/10/2015 0500   HDL 24 (L) 12/09/2015 0430   CHOLHDL 6.6 12/09/2015 0430   VLDL 48 (H) 12/09/2015 0430   LDLCALC 87 12/09/2015 0430   HgbA1c:  Lab Results  Component Value Date   HGBA1C 5.6 12/09/2015   Urine Drug Screen: No results found for: LABOPIA, COCAINSCRNUR, LABBENZ, AMPHETMU, THCU, LABBARB    IMAGING  Dg Abd Portable 1v 12/11/2015 Feeding tube tip overlies the third portion of the duodenum    Cerebral Angiography 12/08/2015 S/P lt common carotid arteriogram,followed by by complete revascularization of occluded Lt MCA m1, with x pass with 4 mm x 40 mm solitaire retrieval device and stent assisted angioplasty of severe underlying stenosis with reocclusion achieving a TICI 2 b revascularization Also received 8 mg of superselective IA tpa.   MRI Brain Wo Contrast 12/09/2015 1. Patchy multi focal acute left MCA territory infarcts, overall moderate in volume. No associated hemorrhage or significant mass effect. 2. Long segment vascular stent in place within the left M1 segment.    CT Head Wo Contrast 12/09/2015 1. Developing areas of hypoattenuation involving the  posterior left lentiform nucleus, the centrum semi ovale, and watershed cortical infarcts without hemorrhage. These represent areas of developing infarct within the left MCA territory of tissue at highest risk. No larger MCA territory infarct is evident. 2. Left MCA stent spanning from the proximal left M1 segment beyond the bifurcation.   CTA Head and Neck 12/08/2015  Head 1. Abrupt occlusion of the proximal left M1 segment, with attenuated distal reconstitution. Finding may reflect a severe high-grade stenosis or possibly partially  occlusive and/or recannulized thrombus. Area of occlusion measures approximately 7 mm in length. Per discussion with Dr. 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 This is not available for review or comparison at time of this dictation. While findings on this CT are felt to be within the M1 segment, it is possible that this lesion is the same lesion discussed on previous arteriogram from 2014. Direct comparison with previous images recommended if available. 2. Otherwise normal CTA of the head.  Neck Negative CTA of the neck. No flow-limiting or critical stenosis identified.   Echocardiogram 12/10/2015  Study Conclusions - Left ventricle: The cavity size was normal. Systolic function was   normal. The estimated ejection fraction was in the range of 55%   to 60%. Wall motion was normal; there were no regional wall   motion abnormalities. Left ventricular diastolic function   parameters were normal.   DG Swallowing Func-Speech Pathology 12/12/2015 Diet recommendations - nectar thick liquids: Dysphagia 1 (Puree) solids    PHYSICAL EXAM Middle aged hispanic male who is intubated and sedated. . Afebrile. Head is nontraumatic. Neck is supple without bruit.     Cardiac exam no murmur or gallop.  Lungs are clear to auscultation.  Abdomen soft NT ND bowel sounds Distal pulses are well felt.  Neurological Exam :  Awake there are components of expressive aphasia and receptive aphasia based on exam with daughter as [name].  He stated first name.  Seemed confused about location and date.  Follows simple commands.   PERRL.  Left gaze preference. Face with flatter NLF on the right.  Cough and gag poor.  Tongue midline.   Patient can move left upper and lower extremity spontaneously and against gravity. Patient has right hemiparesis with grade 2/3 strength on right upper and 3/5  strength on the right lower.   Right plantar upgoing left downgoing.  Gait was not tested.  ASSESSMENT/PLAN James Cortez is a 50 y.o. male with no significant medical history presenting with right facial droop, right hemiparesis and difficulty with speech. He received IV t-PA 12/08/2015 at 1822. 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 Infarct secondary to large vessel disease source  Initial CT normal, ASPECTS 10  CTA head L M1 occlusion w/ distal reconstitution  CTA neck negative   Post IR CT no infarct or hmg. Increased density L parietal and lateral temporal lobe.  MRI - Patchy multi focal acute left MCA territory infarcts. Stent within the left M1 segment.  2D Echo - EF 55-60%. No cardiac source of emboli identified.  LDL 87   HgbA1c 5.6  SCDs for VTE prophylaxis DIET - DYS 1 Room service appropriate? Yes; Fluid consistency:  Nectar Thick  No antithrombotic prior to admission, now on aspirin 325 mg daily and clopidogrel 75 mg daily following a 300 mg plavix load  Ongoing aggressive stroke risk factor management  Therapy recommendations:  CIR recommended  Disposition:  pending   Acute respiratory failure  Intubated for neurointervention; now extubated  CCM onboard  Blood Pressure  Goal per IR  No hx HTN, no home meds  Hyperlipidemia  Home meds:  No statin   LDL 87, goal < 70  Continue Lipitor at 40 mg daily  Other Stroke Risk Factors  Cigarette smoker, advised to stop smoking  ETOH use, advised to drink no more than 2 drink(s) a day   Other Active Problems  Leukocytosis 19.7, etiology unclear (CXR and UA ok) -> 14.5 -> 11.9 (temp 98.1)  Hypokalemia 3.4  Calcium - 8.7 -> 8.4 ->8.5  Jevity  tube feedings / Dysphagia 1 diet ->   Hospital day # 5  NEUROLOGY ATTENDING NOTE Patient was seen and examined by me personally. I independently viewed imaging studies, participated in medical decision making and plan of care. The laboratory and radiographic studies were personally reviewed by me.  ROS limited by lethargy and pertinent positives could not be fully documented due to LOC  Assessment and plan completed by me personally and fully documented above.    If tolerated will begin to mobilize patient more  D/C foley  Replete potassium  Transfer to the floor  Condition is improving.  Transfer to the floor   SIGNED BY: Dr. Elissa Hefty       To contact Stroke Continuity provider, please refer to [email or web address] After hours, contact General Neurology

## 2015-12-14 ENCOUNTER — Inpatient Hospital Stay (HOSPITAL_COMMUNITY): Payer: Self-pay

## 2015-12-14 DIAGNOSIS — F17200 Nicotine dependence, unspecified, uncomplicated: Secondary | ICD-10-CM

## 2015-12-14 DIAGNOSIS — E781 Pure hyperglyceridemia: Secondary | ICD-10-CM

## 2015-12-14 DIAGNOSIS — I63412 Cerebral infarction due to embolism of left middle cerebral artery: Principal | ICD-10-CM

## 2015-12-14 DIAGNOSIS — I639 Cerebral infarction, unspecified: Secondary | ICD-10-CM

## 2015-12-14 LAB — GLUCOSE, CAPILLARY
GLUCOSE-CAPILLARY: 97 mg/dL (ref 65–99)
GLUCOSE-CAPILLARY: 102 mg/dL — AB (ref 65–99)
Glucose-Capillary: 104 mg/dL — ABNORMAL HIGH (ref 65–99)
Glucose-Capillary: 120 mg/dL — ABNORMAL HIGH (ref 65–99)
Glucose-Capillary: 129 mg/dL — ABNORMAL HIGH (ref 65–99)
Glucose-Capillary: 98 mg/dL (ref 65–99)

## 2015-12-14 LAB — CBC
HEMATOCRIT: 40.8 % (ref 39.0–52.0)
Hemoglobin: 14.3 g/dL (ref 13.0–17.0)
MCH: 30.7 pg (ref 26.0–34.0)
MCHC: 35 g/dL (ref 30.0–36.0)
MCV: 87.6 fL (ref 78.0–100.0)
PLATELETS: 319 10*3/uL (ref 150–400)
RBC: 4.66 MIL/uL (ref 4.22–5.81)
RDW: 12.6 % (ref 11.5–15.5)
WBC: 13.9 10*3/uL — AB (ref 4.0–10.5)

## 2015-12-14 LAB — BASIC METABOLIC PANEL
Anion gap: 9 (ref 5–15)
BUN: 6 mg/dL (ref 6–20)
CALCIUM: 9.1 mg/dL (ref 8.9–10.3)
CO2: 23 mmol/L (ref 22–32)
CREATININE: 0.61 mg/dL (ref 0.61–1.24)
Chloride: 107 mmol/L (ref 101–111)
GFR calc Af Amer: >60 mL/min (ref >60)
GLUCOSE: 102 mg/dL — AB (ref 65–99)
POTASSIUM: 4.1 mmol/L (ref 3.5–5.1)
SODIUM: 139 mmol/L (ref 135–145)

## 2015-12-14 LAB — C-REACTIVE PROTEIN: CRP: 1.9 mg/dL — AB (ref <1.0)

## 2015-12-14 LAB — SEDIMENTATION RATE: Sed Rate: 23 mm/hr — ABNORMAL HIGH (ref 0–16)

## 2015-12-14 MED ORDER — ATORVASTATIN CALCIUM 10 MG PO TABS: 20.0000 mg | ORAL_TABLET | Freq: Every day | ORAL | Status: DC

## 2015-12-14 MED ORDER — ENOXAPARIN SODIUM 40 MG/0.4ML ~~LOC~~ SOLN
40.0000 mg | SUBCUTANEOUS | Status: DC
  Administered 2015-12-14 – 2015-12-15 (×2): 40 mg via SUBCUTANEOUS
  Filled 2015-12-14 (×2): qty 0.4

## 2015-12-14 NOTE — Progress Notes (Signed)
Clarified with Burnetta Sabin, GNP that pt medical workup is not complete therefore pt not medically ready to admit to inpt rehab today. I will follow up tomorrow. [phone]

## 2015-12-14 NOTE — Progress Notes (Signed)
**  Preliminary report by tech**  Bilateral lower extremity venous duplex completed. 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 No evidence of deep vein thrombosis involving the right lower extremity and left lower extremity.  There is no evidence of Baker's cysts bilaterally.  Results were given to the patient's nurse, Latoya.  12/14/15 3:34 PM James Cortez RVT

## 2015-12-14 NOTE — Progress Notes (Signed)
Physical Therapy Treatment Patient Details Name: James Cortez MRN: [id number] DOB: 1965/07/22 Today's Date: 12/14/2015    History of Present Illness Patient is a 50 y/o male with hx of skin cancer presents with right sided weakness and aphasia. MRI-  Patchy multi focal acute left MCA territory infarcts; occlusion proximal Left M1 segment s/p tPA with catheter directed revascularization and angioplasty.    PT Comments    Patient progressing well towards PT goals. Pt verbalizing today during session. Continues to demonstrate aphasia (expressive>receptive) but difficult to distinguish due to language barrier. Able to attend to right side with cues and functionally use RUE as well. Requires assist with balance when donning socks and with gait training to prevent LOB. Difficulty following directional cues at times during ambulation but improved distance noted today. Great CIR candidate. Will follow.   Follow Up Recommendations  CIR     Equipment Recommendations  None recommended by PT    Recommendations for Other Services       Precautions / Restrictions Precautions Precautions: Fall Restrictions Weight Bearing Restrictions: No    Mobility  Bed Mobility Overal bed mobility: Needs Assistance Bed Mobility: Supine to Sit     Supine to sit: Min assist;HOB elevated     General bed mobility comments: Increased effort and dificulty getting to EOB as pt not using RUe for mobility.   Transfers Overall transfer level: Needs assistance Equipment used: None Transfers: Sit to/from Stand Sit to Stand: Min guard         General transfer comment: Min guard to steady in standing, cues to use RUE for functional mobility. Transferred to chair post ambulation bout.  Ambulation/Gait Ambulation/Gait assistance: Min assist;Mod assist Ambulation Distance (Feet): 100 Feet Assistive device: None Gait Pattern/deviations: Step-through pattern;Decreased stride length;Staggering left;Staggering  right;Scissoring;Decreased stance time - right Gait velocity: decreased Gait velocity interpretation: Below normal speed for age/gender General Gait Details: Slow, guarded and unsteady gait, difficulty following directional cues consistently. Staggering noted when in small spaces or when navigating room requiring Mod A to prevent fall. Scissoring gait x2.    Stairs            Wheelchair Mobility    Modified Rankin (Stroke Patients Only) Modified Rankin (Stroke Patients Only) Pre-Morbid Rankin Score: No symptoms Modified Rankin: Moderately severe disability     Balance Overall balance assessment: Needs assistance Sitting-balance support: Feet supported;No upper extremity supported Sitting balance-Leahy Scale: Fair Sitting balance - Comments: Able to donn socks reaching outside BoS with much difficulty, effort and Min A for balance and to use RUE.   Standing balance support: During functional activity Standing balance-Leahy Scale: Fair                      Cognition Arousal/Alertness: Awake/alert Behavior During Therapy: WFL for tasks assessed/performed Overall Cognitive Status: Difficult to assess Area of Impairment: Orientation;Safety/judgement;Following commands Orientation Level: Disoriented to;Time;Situation     Following Commands: Follows multi-step commands with increased time;Follows multi-step commands inconsistently     Problem Solving: Requires verbal cues;Slow processing General Comments: Pt verbal during today's session. Some intelligible, some not. Seems confused esp when asked questions and responses to questions but difficult to distinguish aphasia vs. language? With cues, able to state he is in the hospital and his full name. Difficulty stating date and remembering how to say 49 in [place]. Ex wife present to help with Spanish. Still mild inattention to RUE, but uses it when cued. Laughs appropriately.    Exercises  General Comments General  comments (skin integrity, edema, etc.): Ex wife present during session. Assisted with translation into spanish.      Pertinent Vitals/Pain Pain Assessment: No/denies pain    Home Living                      Prior Function            PT Goals (current goals can now be found in the care plan section) Progress towards PT goals: Progressing toward goals    Frequency    Min 4X/week      PT Plan Current plan remains appropriate    Co-evaluation             End of Session Equipment Utilized During Treatment: Gait belt Activity Tolerance: Patient tolerated treatment well Patient left: in chair;with call bell/phone within reach;with family/visitor present     Time: [id number] PT Time Calculation (min) (ACUTE ONLY): 26 min  Charges:  $Gait Training: 8-22 mins $Therapeutic Activity: 8-22 mins                    G Codes:      James Cortez A James Cortez 12/14/2015, 9:54 AM Wray Kearns, PT, DPT [phone]

## 2015-12-14 NOTE — Progress Notes (Signed)
Pt arrived to 5M20 via wheelchair.  Pt alert and oriented, speaks no english but understands it.  Wife at bedside, speaks english.  Telemetry applied and CCMD notified.  Will continue to monitor.  Cori Razor, RN

## 2015-12-14 NOTE — Progress Notes (Signed)
STROKE TEAM PROGRESS NOTE   SUBJECTIVE (INTERVAL HISTORY) Patient wife is at bedside. Awake alert, able to work well with PT/OT and transferred to floor today. Pending CIR placement. He and family is ok for TEE and loop.    OBJECTIVE Temp:  [97.7 F (36.5 C)-98.9 F (37.2 C)] 97.7 F (36.5 C) (11/06 2125) Pulse Rate:  [60-68] 68 (11/06 2125) Cardiac Rhythm: Normal sinus rhythm (11/06 2030) Resp:  [16-66] 18 (11/06 2125) BP: (98-103)/(50-66) 101/53 (11/06 2125) SpO2:  [91 %-95 %] 95 % (11/06 2125) Weight:  [176 lb 5.9 oz (80 kg)] 176 lb 5.9 oz (80 kg) (11/06 0320)  CBC:   Recent Labs Lab 12/12/15 0252 12/13/15 0428 12/14/15 0353  WBC 14.5* 11.9* 13.9*  NEUTROABS 9.8* 7.5  --   HGB 13.7 13.8 14.3  HCT 40.7 40.3 40.8  MCV 89.3 87.6 87.6  PLT 254 290 [id number]    Basic Metabolic Panel:   Recent Labs Lab 12/12/15 0252 12/13/15 0428 12/14/15 0353  NA 140 138  138 139  K 3.6 3.5  3.4* 4.1  CL 110 108  109 107  CO2 21* 22  24 23   GLUCOSE 86 118*  118* 102*  BUN 11 10  10 6   CREATININE 0.61 0.61  0.61 0.61  CALCIUM 8.7* 8.4*  8.5* 9.1  MG 1.9 1.9  --   PHOS 3.4 3.5  --     Lipid Panel:     Component Value Date/Time   CHOL 159 12/09/2015 0430   TRIG 170 (H) 12/10/2015 0500   HDL 24 (L) 12/09/2015 0430   CHOLHDL 6.6 12/09/2015 0430   VLDL 48 (H) 12/09/2015 0430   LDLCALC 87 12/09/2015 0430   HgbA1c:  Lab Results  Component Value Date   HGBA1C 5.6 12/09/2015   Urine Drug Screen: No results found for: LABOPIA, COCAINSCRNUR, LABBENZ, AMPHETMU, THCU, LABBARB    IMAGING I have personally reviewed the radiological images below and agree with the radiology interpretations.  Dg Abd Portable 1v 12/11/2015 Feeding tube tip overlies the third portion of the duodenum   Cerebral Angiography 12/08/2015 S/P lt common carotid arteriogram,followed by by complete revascularization of occluded Lt MCA m1, with x pass with 4 mm x 40 mm solitaire retrieval device and  stent assisted angioplasty of severe underlying stenosis with reocclusion achieving a TICI 2 b revascularization Also received 8 mg of superselective IA tpa.  MRI Brain Wo Contrast 12/09/2015 1. Patchy multi focal acute left MCA territory infarcts, overall moderate in volume. No associated hemorrhage or significant mass effect. 2. Long segment vascular stent in place within the left M1 segment.  CT Head Wo Contrast 12/09/2015 1. Developing areas of hypoattenuation involving the posterior left lentiform nucleus, the centrum semi ovale, and watershed cortical infarcts without hemorrhage. These represent areas of developing infarct within the left MCA territory of tissue at highest risk. No larger MCA territory infarct is evident. 2. Left MCA stent spanning from the proximal left M1 segment beyond the bifurcation.  CTA Head and Neck 12/08/2015 Head 1. Abrupt occlusion of the proximal left M1 segment, with attenuated distal reconstitution. Finding may reflect a severe high-grade stenosis or possibly partially occlusive and/or recannulized thrombus. Area of occlusion measures approximately 7 mm in length. 2. Otherwise normal CTA of the head. Neck Negative CTA of the neck. No flow-limiting or critical stenosis identified.  Echocardiogram 12/10/2015  Study Conclusions - Left ventricle: The cavity size was normal. Systolic function was   normal. The estimated ejection fraction was  in the range of 55%   to 60%. Wall motion was normal; there were no regional wall   motion abnormalities. Left ventricular diastolic function   parameters were normal.  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 No evidence of deep vein thrombosis involving the right lower extremity and left lower extremity.  There is no evidence of Baker's cysts bilaterally.     PHYSICAL EXAM  Temp:  [97.7 F (36.5 C)-98.9 F (37.2 C)]  97.7 F (36.5 C) (11/06 2125) Pulse Rate:  [60-68] 68 (11/06 2125) Resp:  [16-66] 18 (11/06 2125) BP: (98-103)/(50-66) 101/53 (11/06 2125) SpO2:  [91 %-95 %] 95 % (11/06 2125) Weight:  [176 lb 5.9 oz (80 kg)] 176 lb 5.9 oz (80 kg) (11/06 0320)  General - Well nourished, well developed, in no apparent distress.  Ophthalmologic - Fundi not visualized due to noncooperation.  Cardiovascular - Regular rate and rhythm.  Mental Status -  Awake alert, difficulty with orientation questions due to aphasia. Language exam showed expressive aphasia, able to follow simple commands, naming impaired but able to repeat.  Cranial Nerves II - XII - II - Visual field intact OU. III, IV, VI - Extraocular movements intact. V - Facial sensation intact bilaterally. VII - Facial movement intact bilaterally. VIII - Hearing & vestibular intact bilaterally. X - Palate elevates symmetrically. XI - Chin turning & shoulder shrug intact bilaterally. XII - Tongue protrusion intact.  Motor Strength - The patient's strength was 4+/5 in all extremities and pronator drift was absent.  Bulk was normal and fasciculations were absent.   Motor Tone - Muscle tone was assessed at the neck and appendages and was normal.  Reflexes - The patient's reflexes were 1+ in all extremities and he had no pathological reflexes.  Sensory - Light touch, temperature/pinprick were assessed and were symmetrical.    Coordination - The patient had normal movements in the hands with no ataxia or dysmetria.  Tremor was absent.  Gait and Station - deferred.   ASSESSMENT/PLAN [name] is a 50 y.o. male smoker presenting with right facial droop, right hemiparesis and difficulty with speech. He received IV t-PA 12/08/2015. Revascularization of L M1 with solitaire, stent assisted angioplasty and IA tPA with reocclusion leading to a TICI 2b revascularization  Stroke:  left MCA infarct in setting of L M1 occlusion, s/p IV tPA and TICI2b  revascularization with mechanical thrombectomy and MCA stent and IA tPA. Infarct secondary to embolic source as pt lack of evidence of atherosclerosis  Resultant - expressive aphasia  Initial CT normal  MRI - Patchy multi focal acute left MCA territory infarcts. Stent within the left M1 segment.  CTA head L M1 occlusion w/ distal reconstitution  CTA neck negative   2D Echo - EF 55-60%. No cardiac source of emboli identified.  LE venous doppler - neg for DVT  Recommend TEE to evaluate SOE. Then loop recorder to rule out afib  Hypercoagulable pending  LDL 87   HgbA1c 5.6  SCDs for VTE prophylaxis DIET - DYS 1 Room service appropriate? Yes; Fluid consistency: Nectar Thick Diet NPO time specified  No antithrombotic prior to admission, now on aspirin 325 mg daily and clopidogrel 75 mg daily following a 300 mg plavix load.  Ongoing aggressive stroke risk factor management  Therapy recommendations:  CIR recommended  Disposition:  pending   Acute respiratory failure  Intubated for neurointervention; now extubated  Pt  tolerating well  Hyperlipidemia  Home meds:  No statin   LDL 87, goal < 70  Continue Lipitor at 20 mg daily  Tobacco abuse  Current smoker  Smoking cessation counseling provided  Pt is willing to quit  Other Stroke Risk Factors  ETOH use, advised to drink no more than 2 drink(s) a day  Other Active Problems  Leukocytosis 19.7 -> 14.5 -> 11.9 (temp 98.1)  Hypokalemia   Hospital day # 6   Rosalin Hawking, MD PhD Stroke Neurology 12/14/2015 10:49 PM

## 2015-12-14 NOTE — Progress Notes (Signed)
Patient transferring to 10M 20. Report called to [facility]. No questions or concerns at this time.

## 2015-12-14 NOTE — Care Management Note (Addendum)
Case Management Note  Patient Details  Name: James Cortez MRN: [id number] Date of Birth: 06-23-1965  Subjective/Objective:   Presents with acute onset right sided facial droop and right-sided hemiparesis with aphasia.  CT angiogram of the head showed acute occlusion of the M1 segment of the left MCA. S/P revascularization.  PT eval rec CIR , patient needs a TEE and a loop recorder to r/o embolic source before goes to CIR, per Burnetta Sabin, NP,  NCM will cont to follow for dc needs.                  Action/Plan:   Expected Discharge Date:                  Expected Discharge Plan:  [place]  In-House Referral:     Discharge planning Services  CM Consult  Post Acute Care Choice:    Choice offered to:     DME Arranged:    DME Agency:     HH Arranged:    HH Agency:     Status of Service:  In process, will continue to follow  If discussed at Long Length of Stay Meetings, dates discussed:    Additional Comments:  Zenon Mayo, RN 12/14/2015, 10:45 AM

## 2015-12-14 NOTE — Progress Notes (Signed)
Occupational Therapy Treatment Patient Details Name: James Cortez MRN: [id number] DOB: [birth year] Today's Date: 12/14/2015    History of present illness Patient is a 50 y/o male with hx of skin cancer presents with right sided weakness and aphasia. MRI-  Patchy multi focal acute left MCA territory infarcts; occlusion proximal Left M1 segment s/p tPA with catheter directed revascularization and angioplasty.   OT comments  Worked on facilitation of Reach and Meno with Rt UE.  He demonstrates improved Rt UE function.  He initially demonstrated difficulty reaching across midline with Rt UE.  He locates targets on Rt, but doesn't visually target them, but instead looks out the corner of his eye.  He requires min - mod A for ADLs.  He should make  excellent progress on CIR.   Follow Up Recommendations  CIR;Supervision/Assistance - 24 hour    Equipment Recommendations  3 in 1 bedside comode    Recommendations for Other Services      Precautions / Restrictions Precautions Precautions: Fall Precaution Comments: Rt inattention  Restrictions Weight Bearing Restrictions: No       Mobility Bed Mobility Overal bed mobility: Needs Assistance Bed Mobility: Supine to Sit;Sit to Supine     Supine to sit: Min assist Sit to supine: Min guard   General bed mobility comments: Pt requires min A to lift trunk from bed.   Transfers Overall transfer level: Needs assistance   Transfers: Sit to/from Stand Sit to Stand: Min guard              Balance Overall balance assessment: Needs assistance Sitting-balance support: Feet supported Sitting balance-Leahy Scale: Good Sitting balance - Comments: Pt able to doff and donn socks while sitting EOB with min guard assist - no LOB noted.  He was able to reach forward to retrieve item from floor with Rt UE and min guard assist    Standing balance support: Single extremity supported Standing balance-Leahy Scale: Fair                     ADL  Overall ADL's : Needs assistance/impaired                                              Vision                 Additional Comments: Pt locates items/targets on Rt consistently.  He looks out of the corner of his eye when looking to the right and doesn't appear for fully visually target them    Perception     Praxis      Cognition   Behavior During Therapy: [facility] for tasks assessed/performed Overall Cognitive Status: Difficult to assess                       Extremity/Trunk Assessment               Exercises Other Exercises Other Exercises: Worked on functional reach with Rt UE.  Pt reached in all planes on Lt and Rt side of body to retrieve styrofoam cup and lotion container.   He requires min faciliation to correctly orient hand to object, and initially required min facilition to cross midline with Rt UE.  Pt unscrewed top to deodorant container with mod facilitation.  He demonstrates decreased ability to superimpose thumb movements on fingers and vise versa  Shoulder Instructions       General Comments      Pertinent Vitals/ Pain       Pain Assessment: No/denies pain  Home Living                                          Prior Functioning/Environment              Frequency  Min 2X/week        Progress Toward Goals  OT Goals(current goals can now be found in the care plan section)  Progress towards OT goals: Progressing toward goals     Plan Discharge plan remains appropriate    Co-evaluation                 End of Session     Activity Tolerance Patient tolerated treatment well   Patient Left in bed;with call bell/phone within reach;with bed alarm set;with family/visitor present   Nurse Communication Mobility status        Time: [id number] OT Time Calculation (min): 32 min  Charges: OT General Charges $OT Visit: 1 Procedure OT Treatments $Neuromuscular Re-education: 23-37  mins  [name] M 12/14/2015, 2:10 PM

## 2015-12-14 NOTE — Progress Notes (Signed)
Nutrition Follow-up  DOCUMENTATION CODES:   Not applicable  INTERVENTION:    Magic cup (each supplement provides 290 kcal and 9 grams of protein) or Mighty Shake II (each supplement provides 480-500 kcals and 20-23 grams of protein) TID with meals.  NUTRITION DIAGNOSIS:   Predicted suboptimal nutrient intake related to dysphagia as evidenced by  (Being on Puree/ NTL diet).  Ongoing  GOAL:   Patient will meet greater than or equal to 90% of their needs  Progressing  MONITOR:   PO intake, Diet advancement, Labs, Weight trends, TF tolerance  ASSESSMENT:   50 year old male with no significant past medical history. He [facility] after developing acute onset right sided facial droop and right-sided hemiparesis with associated aphasia. He was brought to the emergency department as a code stroke; CT angiogram of the head showed acute occlusion of the M1 segment of the left MCA. He was given systemic TPA and was taken to interventional radiology for revascularization, which was successful. Post procedurally he was taken to the ICU for recovery where he remains on the ventilator.  Patient was extubated on 11/2. Diet advanced to dysphagia 1 with nectar thick liquids. Per review of flow sheets, patient is consuming 50% of meals.Per discussion with RN, patient's girlfriend has been assisting him with lunch today.  Previously received nocturnal TF via NGT, which has been removed. TF now off. Calorie count was ordered on 11/4, but no records have been kept. Unsure of exact PO intake. Given 50% PO intake of meals, patient would benefit from a PO supplement to maximize intake.  Diet Order:  DIET - DYS 1 Room service appropriate? Yes; Fluid consistency: Nectar Thick  Skin:  Reviewed, no issues  Last BM:  10/31  Height:   Ht Readings from Last 1 Encounters:  12/13/15 5\' 10"  (1.778 m)    Weight:   Wt Readings from Last 1 Encounters:  12/14/15 176 lb  5.9 oz (80 kg)    Ideal Body Weight:  75.5 kg  BMI:  Body mass index is 25.31 kg/m.  Estimated Nutritional Needs:   Kcal:  1850-2100 (25-28 kcal/kg bw)  Protein:  75-90 g (1-1.2 g/kg bw)  Fluid:  >2.1 L/day  EDUCATION NEEDS:   No education needs identified at this time  Molli Barrows, [place], [place], [place] Pager [phone] After Hours Pager [phone]

## 2015-12-14 NOTE — Progress Notes (Signed)
[facility] has been requested to perform a transesophageal echocardiogram on 12/15/15 for stroke.  After careful review of history and examination, the risks and benefits of transesophageal echocardiogram have been explained including risks of esophageal damage, perforation (1:10,000 risk), bleeding, pharyngeal hematoma as well as other potential complications associated with conscious sedation including aspiration, arrhythmia, respiratory failure and death. Alternatives to treatment were discussed, questions were answered. Patient is willing to proceed.  TEE - Dr. Heron Nay  @  15:00. NPO after midnight. Meds with sips ok   Arbutus Leas, NP 12/14/2015 5:08 PM

## 2015-12-15 ENCOUNTER — Encounter (HOSPITAL_COMMUNITY): Payer: Self-pay | Admitting: *Deleted

## 2015-12-15 ENCOUNTER — Encounter (HOSPITAL_COMMUNITY)
Admission: EM | Disposition: A | Discharge status: Still In Hospital | Payer: Self-pay | Source: Home / Self Care | Attending: Neurology

## 2015-12-15 ENCOUNTER — Inpatient Hospital Stay (HOSPITAL_COMMUNITY)
Admit: 2015-12-15 | Discharge: 2015-12-15 | Disposition: A | Discharge status: Home / Self Care | Payer: Self-pay | Attending: Cardiology | Admitting: Cardiology

## 2015-12-15 ENCOUNTER — Inpatient Hospital Stay (HOSPITAL_COMMUNITY)
Admission: RE | Admit: 2015-12-15 | Discharge: 2015-12-25 | DRG: 057 | Disposition: A | Discharge status: Home Health | Payer: Self-pay | Source: Intra-hospital | Attending: Physical Medicine & Rehabilitation | Admitting: Physical Medicine & Rehabilitation

## 2015-12-15 DIAGNOSIS — E785 Hyperlipidemia, unspecified: Secondary | ICD-10-CM

## 2015-12-15 DIAGNOSIS — I639 Cerebral infarction, unspecified: Secondary | ICD-10-CM

## 2015-12-15 DIAGNOSIS — I63312 Cerebral infarction due to thrombosis of left middle cerebral artery: Secondary | ICD-10-CM

## 2015-12-15 DIAGNOSIS — G8191 Hemiplegia, unspecified affecting right dominant side: Secondary | ICD-10-CM

## 2015-12-15 DIAGNOSIS — R1312 Dysphagia, oropharyngeal phase: Secondary | ICD-10-CM

## 2015-12-15 DIAGNOSIS — I10 Essential (primary) hypertension: Secondary | ICD-10-CM | POA: Diagnosis present

## 2015-12-15 DIAGNOSIS — I69392 Facial weakness following cerebral infarction: Secondary | ICD-10-CM

## 2015-12-15 DIAGNOSIS — I69390 Apraxia following cerebral infarction: Secondary | ICD-10-CM

## 2015-12-15 DIAGNOSIS — F17210 Nicotine dependence, cigarettes, uncomplicated: Secondary | ICD-10-CM

## 2015-12-15 DIAGNOSIS — I63512 Cerebral infarction due to unspecified occlusion or stenosis of left middle cerebral artery: Secondary | ICD-10-CM | POA: Diagnosis present

## 2015-12-15 DIAGNOSIS — Z85828 Personal history of other malignant neoplasm of skin: Secondary | ICD-10-CM

## 2015-12-15 DIAGNOSIS — I69351 Hemiplegia and hemiparesis following cerebral infarction affecting right dominant side: Principal | ICD-10-CM

## 2015-12-15 DIAGNOSIS — I69320 Aphasia following cerebral infarction: Secondary | ICD-10-CM

## 2015-12-15 DIAGNOSIS — R1310 Dysphagia, unspecified: Secondary | ICD-10-CM

## 2015-12-15 DIAGNOSIS — I69391 Dysphagia following cerebral infarction: Secondary | ICD-10-CM

## 2015-12-15 DIAGNOSIS — I340 Nonrheumatic mitral (valve) insufficiency: Secondary | ICD-10-CM

## 2015-12-15 HISTORY — PX: EP IMPLANTABLE DEVICE: SHX172B

## 2015-12-15 HISTORY — PX: TEE WITHOUT CARDIOVERSION: SHX5443

## 2015-12-15 LAB — BASIC METABOLIC PANEL
Anion gap: 6 (ref 5–15)
BUN: 7 mg/dL (ref 6–20)
CHLORIDE: 109 mmol/L (ref 101–111)
CO2: 24 mmol/L (ref 22–32)
CREATININE: 0.67 mg/dL (ref 0.61–1.24)
Calcium: 8.9 mg/dL (ref 8.9–10.3)
GFR calc Af Amer: >60 mL/min (ref >60)
GFR calc non Af Amer: >60 mL/min (ref >60)
Glucose, Bld: 98 mg/dL (ref 65–99)
Potassium: 4.1 mmol/L (ref 3.5–5.1)
Sodium: 139 mmol/L (ref 135–145)

## 2015-12-15 LAB — GLUCOSE, CAPILLARY
GLUCOSE-CAPILLARY: 85 mg/dL (ref 65–99)
GLUCOSE-CAPILLARY: 75 mg/dL (ref 65–99)
GLUCOSE-CAPILLARY: 94 mg/dL (ref 65–99)
Glucose-Capillary: 94 mg/dL (ref 65–99)

## 2015-12-15 LAB — LUPUS ANTICOAGULANT PANEL
DRVVT: 41.6 s (ref 0.0–47.0)
PTT LA: 34.3 s (ref 0.0–51.9)

## 2015-12-15 LAB — HOMOCYSTEINE: Homocysteine: 7.4 umol/L (ref 0.0–15.0)

## 2015-12-15 LAB — CBC
HCT: 40.3 % (ref 39.0–52.0)
Hemoglobin: 13.9 g/dL (ref 13.0–17.0)
MCH: 30.2 pg (ref 26.0–34.0)
MCHC: 34.5 g/dL (ref 30.0–36.0)
MCV: 87.4 fL (ref 78.0–100.0)
PLATELETS: 318 10*3/uL (ref 150–400)
RBC: 4.61 MIL/uL (ref 4.22–5.81)
RDW: 12.6 % (ref 11.5–15.5)
WBC: 11.4 10*3/uL — ABNORMAL HIGH (ref 4.0–10.5)

## 2015-12-15 LAB — BETA-2-GLYCOPROTEIN I ABS, IGG/M/A
Beta-2-Glycoprotein I IgA: <9 GPI IgA units (ref 0–25)
Beta-2-Glycoprotein I IgM: <9 GPI IgM units (ref 0–32)

## 2015-12-15 LAB — ANTI-DNA ANTIBODY, DOUBLE-STRANDED: DS DNA AB: 1 [IU]/mL (ref 0–9)

## 2015-12-15 LAB — CARDIOLIPIN ANTIBODIES, IGG, IGM, IGA: ANTICARDIOLIPIN IGM: 10 [MPL'U]/mL (ref 0–12)

## 2015-12-15 LAB — ANTINUCLEAR ANTIBODIES, IFA: ANTINUCLEAR ANTIBODIES, IFA: NEGATIVE

## 2015-12-15 SURGERY — LOOP RECORDER INSERTION

## 2015-12-15 SURGERY — ECHOCARDIOGRAM, TRANSESOPHAGEAL
Anesthesia: Moderate Sedation

## 2015-12-15 MED ORDER — CLOPIDOGREL BISULFATE 75 MG PO TABS
75.0000 mg | ORAL_TABLET | Freq: Every day | ORAL | Status: DC
  Administered 2015-12-16 – 2015-12-25 (×10): 75 mg via ORAL
  Filled 2015-12-15 (×10): qty 1

## 2015-12-15 MED ORDER — FENTANYL CITRATE (PF) 100 MCG/2ML IJ SOLN
INTRAMUSCULAR | Status: AC
  Filled 2015-12-15: qty 2

## 2015-12-15 MED ORDER — ENOXAPARIN SODIUM 40 MG/0.4ML ~~LOC~~ SOLN
40.0000 mg | SUBCUTANEOUS | Status: DC
  Administered 2015-12-16 – 2015-12-24 (×9): 40 mg via SUBCUTANEOUS
  Filled 2015-12-15 (×9): qty 0.4

## 2015-12-15 MED ORDER — ATORVASTATIN CALCIUM 20 MG PO TABS
20.0000 mg | ORAL_TABLET | Freq: Every day | ORAL | Status: DC
  Administered 2015-12-16 – 2015-12-24 (×9): 20 mg via ORAL
  Filled 2015-12-15 (×9): qty 1

## 2015-12-15 MED ORDER — FENTANYL CITRATE (PF) 100 MCG/2ML IJ SOLN
INTRAMUSCULAR | Status: DC | PRN
  Administered 2015-12-15: 12.5 ug via INTRAVENOUS
  Administered 2015-12-15: 25 ug via INTRAVENOUS
  Administered 2015-12-15: 12.5 ug via INTRAVENOUS

## 2015-12-15 MED ORDER — LIDOCAINE-EPINEPHRINE 1 %-1:100000 IJ SOLN
INTRAMUSCULAR | Status: AC
  Filled 2015-12-15: qty 1

## 2015-12-15 MED ORDER — MIDAZOLAM HCL 5 MG/ML IJ SOLN
INTRAMUSCULAR | Status: AC
  Filled 2015-12-15: qty 2

## 2015-12-15 MED ORDER — LIDOCAINE-EPINEPHRINE 1 %-1:100000 IJ SOLN
INTRAMUSCULAR | Status: DC | PRN
  Administered 2015-12-15: 10 mL via INTRADERMAL

## 2015-12-15 MED ORDER — ONDANSETRON HCL 4 MG PO TABS: 4.0000 mg | ORAL_TABLET | Freq: Four times a day (QID) | ORAL | Status: DC | PRN

## 2015-12-15 MED ORDER — ENOXAPARIN SODIUM 40 MG/0.4ML ~~LOC~~ SOLN
40.0000 mg | SUBCUTANEOUS | Status: DC
Start: 2015-12-16 — End: 2015-12-15

## 2015-12-15 MED ORDER — ACETAMINOPHEN 325 MG PO TABS: 325.0000 mg | ORAL_TABLET | ORAL | Status: DC | PRN

## 2015-12-15 MED ORDER — BUTAMBEN-TETRACAINE-BENZOCAINE 2-2-14 % EX AERO
INHALATION_SPRAY | CUTANEOUS | Status: DC | PRN
  Administered 2015-12-15: 2 via TOPICAL

## 2015-12-15 MED ORDER — MIDAZOLAM HCL 10 MG/2ML IJ SOLN
INTRAMUSCULAR | Status: DC | PRN
  Administered 2015-12-15: 2 mg via INTRAVENOUS
  Administered 2015-12-15: 1 mg via INTRAVENOUS

## 2015-12-15 MED ORDER — SORBITOL 70 % SOLN
30.0000 mL | Freq: Every day | Status: DC | PRN
  Administered 2015-12-19 – 2015-12-24 (×2): 30 mL via ORAL
  Filled 2015-12-15 (×2): qty 30

## 2015-12-15 MED ORDER — ASPIRIN EC 325 MG PO TBEC
325.0000 mg | DELAYED_RELEASE_TABLET | Freq: Every day | ORAL | Status: DC
  Administered 2015-12-16 – 2015-12-25 (×10): 325 mg via ORAL
  Filled 2015-12-15 (×10): qty 1

## 2015-12-15 MED ORDER — ONDANSETRON HCL 4 MG/2ML IJ SOLN: 4.0000 mg | Freq: Four times a day (QID) | INTRAMUSCULAR | Status: DC | PRN

## 2015-12-15 MED ORDER — SODIUM CHLORIDE 0.9 % IV SOLN: INTRAVENOUS | Status: DC

## 2015-12-15 SURGICAL SUPPLY — 2 items
LOOP REVEAL LINQSYS (Prosthesis & Implant Heart) ×2 IMPLANT
PACK LOOP INSERTION (CUSTOM PROCEDURE TRAY) ×3 IMPLANT

## 2015-12-15 NOTE — Progress Notes (Signed)
Charlett Blake, MD Physician Signed Physical Medicine and Rehabilitation  Consult Note Date of Service: 12/10/2015 2:47 PM  Related encounter: ED to Hosp-Admission (Current) from 12/08/2015 in [facility] All Collapse All   [] Hide copied text [] Hover for attribution information      Physical Medicine and Rehabilitation Consult Reason for Consult: Left MCA territory infarct Referring Physician: Dr. Leonie Man   HPI: James Cortez is a 49 y.o. Spanish limited English-speaking right handed male with history of tobacco abuse. Per chart review patient lives with cousin. Independent prior to admission. 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 Present 12/08/2015 with right-sided weakness facial droop and aphasia. Cranial CT scan negative. Patient did receive TPA. CT angiogram showed acute occlusion of the M1 segment of the left middle cerebral artery. 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 No associated hemorrhage. Underwent revascularization per interventional radiology. Patient self extubated after procedure. Echocardiogram pending. Currently on Plavix for CVA prophylaxis. Physical therapy evaluation completed 12/10/2015 with recommendations of physical medicine rehabilitation consult.  Patient is non-English speaking. Wife at bedside with minimal English. However, his daughter speaks fluent [place] and [place]. Daughter indicates that the patient is unable to carry on a conversation with them. He sometimes can follow commands.  Review of Systems  Unable to perform ROS: Language  Aphasic in native language     Past Medical History:  Diagnosis Date  . Skin cancer         Past Surgical History:  Procedure Laterality Date  . RADIOLOGY WITH ANESTHESIA Right 12/08/2015   Procedure: RADIOLOGY WITH ANESTHESIA - CODE STROKE;  Surgeon: Medication Radiologist, MD;  Location: [facility];   Service: Radiology;  Laterality: Right;  . SKIN SURGERY     History reviewed. No pertinent family history. Social History:  reports that he has been smoking Cigarettes.  He has been smoking about 1.00 pack per day. He has never used smokeless tobacco. He reports that he drinks alcohol. He reports that he does not use drugs. Allergies: No Known Allergies       Medications Prior to Admission  Medication Sig Dispense Refill  . OVER THE COUNTER MEDICATION Place 1-2 drops into both eyes daily as needed (eye irritation).    . cyclobenzaprine (FLEXERIL) 10 MG tablet Take 1 tablet (10 mg total) by mouth 2 (two) times daily as needed for muscle spasms. 20 tablet 0  . ibuprofen (ADVIL,MOTRIN) 600 MG tablet Take 1 tablet (600 mg total) by mouth every 6 (six) hours as needed. (Patient not taking: Reported on 12/09/2015) 30 tablet 0    Home: [place] expects to be discharged to:: Private residence Living Arrangements: Spouse/significant other, Children Available Help at Discharge: Family Type of Home: House Home Access: Stairs to enter Technical brewer of Steps: 3 Entrance Stairs-Rails: None Home Layout: One level Biochemist, clinical: Standard Home Equipment: None  Functional History: Prior Function Level of Independence: Independent Comments: Works in Architect; drives.  Functional Status:  Mobility: Bed Mobility Overal bed mobility: Needs Assistance Bed Mobility: Supine to Sit Supine to sit: Mod assist, HOB elevated General bed mobility comments: Able to initiate moving LLE to EOB, assist with RLE and to elevate trunk but good intiation of movement overall. Not using RUE at all during transfer. Transfers Overall transfer level: Needs assistance Equipment used: 1 person hand held assist Transfers: Sit to/from Stand, Stand Pivot Transfers Sit to Stand: Min assist  Stand pivot transfers: Min assist General transfer comment: Assist to boost from EOB. SPT bed to  chair with assist for balance. Pt dragging RLE.       ADL:    Cognition: Cognition Overall Cognitive Status: Difficult to assess Orientation Level: Other (comment) (language barrier) Cognition Arousal/Alertness: Awake/alert Behavior During Therapy: Flat affect Overall Cognitive Status: Difficult to assess Area of Impairment: Orientation, Problem solving, Following commands Orientation Level: Disoriented to, Place, Person, Time, Situation Following Commands: Follows one step commands inconsistently, Follows one step commands with increased time (multi modal cues) Problem Solving: Slow processing, Decreased initiation, Requires verbal cues General Comments: Non verbal throughout session. Shakes head yes or no to questions inappropriately; shakes head no to correct name and if he is in the hospital. Mild right inattention noted but able to attend to right visually with cues.  Difficult to assess due to: Impaired communication  Blood pressure 121/68, pulse 69, temperature 98.9 F (37.2 C), temperature source Oral, resp. rate (!) 26, height 5\' 10"  (1.778 m), weight 74.5 kg (164 lb 3.9 oz), SpO2 100 %. Physical Exam  HENT:  Right facial droop  Eyes:  Pupils sluggish to light  Neck: Normal range of motion. Neck supple. No thyromegaly present.  Cardiovascular: Normal rate and regular rhythm.   Respiratory:  Decreased breath sounds at the bases  GI: Soft. Bowel sounds are normal. He exhibits no distension.  Neurological:  Lethargic but arousable. Patient appears to be aphasic. He did not follow commands. Grimaces to deep palpation  Skin: Skin is warm and dry.  , Trace right finger flexion trace. Right elbow extension 0 elbow flexion 0. Shoulder abduction on the right. Left side does not cooperate with examination. Some spontaneous movement noted of the left upper extremity. Patient has spontaneous movement and antigravity strength in bilateral lower extremities Sensation cannot  assess secondary to aphasia Lab Results Last 24 Hours       Results for orders placed or performed during the hospital encounter of 12/08/15 (from the past 24 hour(s))  Procalcitonin     Status: None   Collection Time: 12/10/15  5:00 AM  Result Value Ref Range   Procalcitonin <0.10 ng/mL  Triglycerides     Status: Abnormal   Collection Time: 12/10/15  5:00 AM  Result Value Ref Range   Triglycerides 170 (H) <150 mg/dL  Renal function panel     Status: Abnormal   Collection Time: 12/10/15  5:00 AM  Result Value Ref Range   Sodium 141 135 - 145 mmol/L   Potassium 3.4 (L) 3.5 - 5.1 mmol/L   Chloride 112 (H) 101 - 111 mmol/L   CO2 22 22 - 32 mmol/L   Glucose, Bld 105 (H) 65 - 99 mg/dL   BUN 6 6 - 20 mg/dL   Creatinine, Ser 0.64 0.61 - 1.24 mg/dL   Calcium 8.5 (L) 8.9 - 10.3 mg/dL   Phosphorus 2.7 2.5 - 4.6 mg/dL   Albumin 3.0 (L) 3.5 - 5.0 g/dL   GFR calc non Af Amer >60 >60 mL/min   GFR calc Af Amer >60 >60 mL/min   Anion gap 7 5 - 15  CBC with Differential/Platelet     Status: Abnormal   Collection Time: 12/10/15  5:00 AM  Result Value Ref Range   WBC 16.4 (H) 4.0 - 10.5 K/uL   RBC 4.56 4.22 - 5.81 MIL/uL   Hemoglobin 13.7 13.0 - 17.0 g/dL   HCT 40.4 39.0 - 52.0 %   MCV 88.6 78.0 -  100.0 fL   MCH 30.0 26.0 - 34.0 pg   MCHC 33.9 30.0 - 36.0 g/dL   RDW 13.3 11.5 - 15.5 %   Platelets 229 150 - 400 K/uL   Neutrophils Relative % 70 %   Neutro Abs 11.6 (H) 1.7 - 7.7 K/uL   Lymphocytes Relative 23 %   Lymphs Abs 3.7 0.7 - 4.0 K/uL   Monocytes Relative 7 %   Monocytes Absolute 1.1 (H) 0.1 - 1.0 K/uL   Eosinophils Relative 0 %   Eosinophils Absolute 0.1 0.0 - 0.7 K/uL   Basophils Relative 0 %   Basophils Absolute 0.0 0.0 - 0.1 K/uL  Magnesium     Status: None   Collection Time: 12/10/15  5:00 AM  Result Value Ref Range   Magnesium 2.0 1.7 - 2.4 mg/dL      Imaging Results (Last 48 hours)  Ct Angio Head W Or Wo Contrast  Result  Date: 12/08/2015 CLINICAL DATA:  Initial evaluation for acute right-sided weakness, right-sided facial droop. EXAM: CT ANGIOGRAPHY HEAD AND NECK TECHNIQUE: Multidetector CT imaging of the head and neck was performed using the standard protocol during bolus administration of intravenous contrast. Multiplanar CT image reconstructions and MIPs were obtained to evaluate the vascular anatomy. Carotid stenosis measurements (when applicable) are obtained utilizing NASCET criteria, using the distal internal carotid diameter as the denominator. CONTRAST:  50 cc of Isovue 370. COMPARISON:  Comparison made with prior noncontrast head CT performed earlier on the same day. FINDINGS: CTA NECK FINDINGS Aortic arch: Visualized aortic arch is of normal caliber with normal 3 vessel morphology. No high-grade stenosis at the origin of the great vessels. Visualized subclavian arteries are widely patent. Right carotid system: Right common carotid artery patent from its origin to the bifurcation. No significant atheromatous plaque about the right bifurcation. Right ICA widely patent from the bifurcation to the skullbase. No stenosis, dissection, or vascular occlusion identified within the right carotid artery system. Right external carotid artery and its branches within normal limits. Left carotid system: Left common carotid artery widely patent from its origin two the bifurcation. No significant atheromatous plaque about the left bifurcation. Left ICA widely patent from the bifurcation to the skullbase. No stenosis, dissection, or vascular occlusion within the left carotid artery system. Left external carotid artery and its branches within normal limits. Vertebral arteries: Both of the vertebral arteries arise from the subclavian arteries. Vertebral arteries widely patent within the neck without stenosis, dissection, or occlusion. Skeleton: No acute osseous abnormality. No worrisome lytic or blastic osseous lesions. Other neck:  Visualized soft tissues of the neck within normal limits without acute abnormality. Thyroid normal. No adenopathy. Upper chest: Visualized upper mediastinum is normal. Visualized lungs are clear. Review of the MIP images confirms the above findings CTA HEAD FINDINGS Anterior circulation: The petrous, cavernous, and supraclinoid segments of the internal carotid arteries are widely patent without flow-limiting stenosis. ICA termini themselves appear to be patent. A1 segments patent. Right A1 segment hypoplastic. Anterior communicating artery normal. Anterior cerebral arch well opacified to their distal aspects. On the left, there is abrupt cough of the left M1 segment, which fever reflects high-grade stenosis or possibly embolic occlusion. Area of occlusion measures approximately 7 mm. There is reconstitution distally, with attenuated flow within the distal left M1 segment and left MCA branches. Right M1 segment widely patent. Right MCA bifurcation normal. Distal right MCA branches well opacified. Posterior circulation: Vertebral arteries are patent to the vertebrobasilar junction. Mild atheromatous irregularity within the  left V4 segment without high-grade stenosis. Posterior inferior cerebral arteries patent bilaterally. Basilar artery widely patent. Superior cerebral arteries patent bilaterally. Right PCA supplied via the basilar artery and is well opacified to its distal aspect. There is a fetal type left PCA supplied via a widely patent left posterior communicating artery. Venous sinuses: Grossly patent. Anatomic variants: Fetal type left PCA. No aneurysm or vascular malformation. Delayed phase: Not performed. Review of the MIP images confirms the above findings IMPRESSION: CTA HEAD IMPRESSION: 1. Abrupt occlusion of the proximal left M1 segment, with attenuated distal reconstitution. Finding may reflect a severe high-grade stenosis or possibly partially occlusive and/or recannulized thrombus. Area of occlusion  measures approximately 7 mm in length. Per discussion with Dr. 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 This is not available for review or comparison at time of this dictation. While findings on this CT are felt to be within the M1 segment, it is possible that this lesion is the same lesion discussed on previous arteriogram from 2014. Direct comparison with previous images recommended if available. 2. Otherwise normal CTA of the head. CTA NECK IMPRESSION: Negative CTA of the neck. No flow-limiting or critical stenosis identified. Critical Value/emergent results were called by telephone at the time of interpretation on 12/08/2015 at 6:22 pm to Dr. Leonie Man , who verbally acknowledged these results. Electronically Signed   By: Jeannine Boga M.D.   On: 12/08/2015 19:05   Ct Head Wo Contrast  Result Date: 12/09/2015 CLINICAL DATA:  Progressive right-sided weakness. Status post revascularization of the left middle cerebral artery. EXAM: CT HEAD WITHOUT CONTRAST TECHNIQUE: Contiguous axial images were obtained from the base of the skull through the vertex without intravenous contrast. COMPARISON:  CTA head and neck 12/08/2015 FINDINGS: Brain: A developing area of hypoattenuation is present in the posterior left lentiform nucleus and extending superiorly within the centrum semi of ally. Small nonhemorrhagic cortical infarcts are present in a watershed distribution over the left convexity as well. Previously noted hyperdensity over the left hemisphere likely reflected some element of luxury perfusion. There is no evidence for subarachnoid hemorrhage on today's study. The right basal ganglia are intact. Focal calcification along the anterior horn of the left lateral ventricle is stable. Vascular: A left M1 stent extends beyond the bifurcation. No significant vascular calcifications are present otherwise. Skull:  The calvarium is intact. Sinuses/Orbits: Diffuse mucosal thickening is present throughout the paranasal sinuses. There are no significant fluid levels. The mastoid air cells are clear. IMPRESSION: 1. Developing areas of hypoattenuation involving the posterior left lentiform nucleus, the centrum semi ovale, and watershed cortical infarcts without hemorrhage. These represent areas of developing infarct within the left MCA territory of tissue at highest risk. No larger MCA territory infarct is evident. 2. Left MCA stent spanning from the proximal left M1 segment beyond the bifurcation. Electronically Signed   By: San Morelle M.D.   On: 12/09/2015 09:33   Ct Head Wo Contrast  Result Date: 12/09/2015 CLINICAL DATA:  50 y/o  M; left M1 occlusion revascularization. EXAM: CT HEAD WITHOUT CONTRAST TECHNIQUE: Contiguous axial images were obtained from the base of the skull through the vertex without intravenous contrast. COMPARISON:  12/08/2015 CT head. FINDINGS: Brain: Stable calcification in the left frontal periventricular white matter. No evidence for large territory infarct, focal mass effect, or brain parenchymal hemorrhage. Increased density within sulci in the left parietal and lateral temporal lobe in  comparison with the contralateral hemisphere is probably due to vessel engorgement and hyperemia post revascularization. Less likely subarachnoid hemorrhage. Vascular: Left M1 segment stent. Skull: Normal. Negative for fracture or focal lesion. Sinuses/Orbits: Moderate diffuse paranasal sinus mucosal thickening. Normal pneumatization of mastoid air cells. Orbits are unremarkable. Other: None. IMPRESSION: 1. No infarct or brain parenchymal hemorrhage identified. 2. Increased density within sulci in the left parietal and lateral temporal lobe in comparison with the contralateral hemisphere is probably due to vessel engorgement and hyperemia post revascularization, less likely subarachnoid hemorrhage.  Electronically Signed   By: Kristine Garbe M.D.   On: 12/09/2015 00:03   Ct Angio Neck W And/or Wo Contrast  Result Date: 12/08/2015 CLINICAL DATA:  Initial evaluation for acute right-sided weakness, right-sided facial droop. EXAM: CT ANGIOGRAPHY HEAD AND NECK TECHNIQUE: Multidetector CT imaging of the head and neck was performed using the standard protocol during bolus administration of intravenous contrast. Multiplanar CT image reconstructions and MIPs were obtained to evaluate the vascular anatomy. Carotid stenosis measurements (when applicable) are obtained utilizing NASCET criteria, using the distal internal carotid diameter as the denominator. CONTRAST:  50 cc of Isovue 370. COMPARISON:  Comparison made with prior noncontrast head CT performed earlier on the same day. FINDINGS: CTA NECK FINDINGS Aortic arch: Visualized aortic arch is of normal caliber with normal 3 vessel morphology. No high-grade stenosis at the origin of the great vessels. Visualized subclavian arteries are widely patent. Right carotid system: Right common carotid artery patent from its origin to the bifurcation. No significant atheromatous plaque about the right bifurcation. Right ICA widely patent from the bifurcation to the skullbase. No stenosis, dissection, or vascular occlusion identified within the right carotid artery system. Right external carotid artery and its branches within normal limits. Left carotid system: Left common carotid artery widely patent from its origin two the bifurcation. No significant atheromatous plaque about the left bifurcation. Left ICA widely patent from the bifurcation to the skullbase. No stenosis, dissection, or vascular occlusion within the left carotid artery system. Left external carotid artery and its branches within normal limits. Vertebral arteries: Both of the vertebral arteries arise from the subclavian arteries. Vertebral arteries widely patent within the neck without stenosis,  dissection, or occlusion. Skeleton: No acute osseous abnormality. No worrisome lytic or blastic osseous lesions. Other neck: Visualized soft tissues of the neck within normal limits without acute abnormality. Thyroid normal. No adenopathy. Upper chest: Visualized upper mediastinum is normal. Visualized lungs are clear. Review of the MIP images confirms the above findings CTA HEAD FINDINGS Anterior circulation: The petrous, cavernous, and supraclinoid segments of the internal carotid arteries are widely patent without flow-limiting stenosis. ICA termini themselves appear to be patent. A1 segments patent. Right A1 segment hypoplastic. Anterior communicating artery normal. Anterior cerebral arch well opacified to their distal aspects. On the left, there is abrupt cough of the left M1 segment, which fever reflects high-grade stenosis or possibly embolic occlusion. Area of occlusion measures approximately 7 mm. There is reconstitution distally, with attenuated flow within the distal left M1 segment and left MCA branches. Right M1 segment widely patent. Right MCA bifurcation normal. Distal right MCA branches well opacified. Posterior circulation: Vertebral arteries are patent to the vertebrobasilar junction. Mild atheromatous irregularity within the left V4 segment without high-grade stenosis. Posterior inferior cerebral arteries patent bilaterally. Basilar artery widely patent. Superior cerebral arteries patent bilaterally. Right PCA supplied via the basilar artery and is well opacified to its distal aspect. There is a fetal type left PCA supplied  via a widely patent left posterior communicating artery. Venous sinuses: Grossly patent. Anatomic variants: Fetal type left PCA. No aneurysm or vascular malformation. Delayed phase: Not performed. Review of the MIP images confirms the above findings IMPRESSION: CTA HEAD IMPRESSION: 1. Abrupt occlusion of the proximal left M1 segment, with attenuated distal reconstitution.  Finding may reflect a severe high-grade stenosis or possibly partially occlusive and/or recannulized thrombus. Area of occlusion measures approximately 7 mm in length. Per discussion with Dr. 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 This is not available for review or comparison at time of this dictation. While findings on this CT are felt to be within the M1 segment, it is possible that this lesion is the same lesion discussed on previous arteriogram from 2014. Direct comparison with previous images recommended if available. 2. Otherwise normal CTA of the head. CTA NECK IMPRESSION: Negative CTA of the neck. No flow-limiting or critical stenosis identified. Critical Value/emergent results were called by telephone at the time of interpretation on 12/08/2015 at 6:22 pm to Dr. Leonie Man , who verbally acknowledged these results. Electronically Signed   By: Jeannine Boga M.D.   On: 12/08/2015 19:05   Mr Brain Wo Contrast  Result Date: 12/09/2015 CLINICAL DATA:  Initial evaluation for acute stroke, status post tPA with catheter directed revascularization and angioplasty. EXAM: MRI HEAD WITHOUT CONTRAST TECHNIQUE: Multiplanar, multiecho pulse sequences of the brain and surrounding structures were obtained without intravenous contrast. COMPARISON:  Prior CT from earlier same day as well as prior examinations from 12/08/2015. The FINDINGS: Brain: Cerebral volume normal for patient age. No significant cerebral white matter disease. There are patchy multi focal areas of restricted diffusion involving the left MCA territory, compatible with acute left MCA territory infarcts. Specifically, many of these foci are cortical and subcortical in nature, in a somewhat watershed distribution. There is confluent restricted diffusion involving the left caudate and lentiform nuclei. Associated T2/FLAIR signal abnormality with  gyral edema present within the areas of infarction. No significant associated hemorrhage. Susceptibility artifact at the level of the left caudate head is consistent with parenchymal calcification at this level. Left lateral ventricle is mildly attenuated without significant mass effect. No midline shift. No other evidence for acute ischemia. Gray-white matter differentiation otherwise maintained. No other evidence for acute or chronic intracranial hemorrhage. No other areas of chronic infarction. No mass lesion. No hydrocephalus. No extra-axial fluid collection. Major dural sinuses are grossly patent. Apparent FLAIR signal abnormality overlying the dural/posterior aspect of the brain is felt to be artifactual on this exam. Pituitary gland and suprasellar region within normal limits. Vascular: Extent susceptibility artifact within the left M1 segment due to vascular stent. Major intracranial vascular flow voids are otherwise maintained. Skull and upper cervical spine: Craniocervical junction normal. Visualized upper cervical spine unremarkable. Bone marrow signal intensity within normal limits. No scalp soft tissue abnormality. Sinuses/Orbits: Globes and orbital soft tissues within normal limits. Scattered mucosal thickening throughout the paranasal sinuses. Fluid present within the nasopharynx. Patient appears to be intubated. No mastoid effusion. Inner ear structures grossly normal. IMPRESSION: 1. Patchy multi focal acute left MCA territory infarcts, overall moderate in volume. No associated hemorrhage or significant mass effect. 2. Long segment vascular stent in place within the left M1 segment. Electronically Signed   By: Jeannine Boga M.D.   On: 12/09/2015 19:23   Portable Chest Xray  Result Date: 12/09/2015 CLINICAL DATA:  50 y/o  M; endotracheal tube placement. EXAM: PORTABLE CHEST 1 VIEW COMPARISON:  None. FINDINGS: Cardiac silhouette within normal limits. Endotracheal tube tip 5.5 cm from  carina. Endotracheal tube tip below the field of the in the abdomen, proximal side hole near the gastroesophageal junction. Clear lungs. No pneumothorax or effusion. IMPRESSION: Endotracheal tube tip 5.5 cm from carina at lower clavicle level. Enteric tube tip in the abdomen with proximal side hole near the gastroesophageal junction, suggest advancement. Clear lung. Electronically Signed   By: Kristine Garbe M.D.   On: 12/09/2015 00:05   Dg Abd Portable 1v  Result Date: 12/09/2015 CLINICAL DATA:  NG tube position EXAM: PORTABLE ABDOMEN - 1 VIEW COMPARISON:  None. FINDINGS: Normal small bowel gas pattern. There is NG tube with tip in distal stomach/pyloric region. IMPRESSION: NG tube with tip in distal stomach/ pyloric region. Electronically Signed   By: Lahoma Crocker M.D.   On: 12/09/2015 11:25   Dg Abd Portable 1v  Result Date: 12/09/2015 CLINICAL DATA:  50 y/o  M; nasogastric tube placement. EXAM: PORTABLE ABDOMEN - 1 VIEW COMPARISON:  None. FINDINGS: Normal bowel gas pattern. Enteric tube tip in proximal stomach with proximal side hole near the gastroesophageal junction, suggest advancement. Contrast accumulation in the kidneys from recent IV contrast administration. Mild degenerative changes of lumbar spine. IMPRESSION: Enteric tube tip in proximal stomach with proximal side hole near the gastroesophageal junction, suggest advancement. Electronically Signed   By: Kristine Garbe M.D.   On: 12/09/2015 00:06   Ct Head Code Stroke W/o Cm  Result Date: 12/08/2015 CLINICAL DATA:  Code stroke.  Right-sided weakness EXAM: CT HEAD WITHOUT CONTRAST TECHNIQUE: Contiguous axial images were obtained from the base of the skull through the vertex without intravenous contrast. COMPARISON:  CT head 03/11/2004 FINDINGS: Brain: Ventricle size normal. Cerebral volume normal. Negative for acute infarct. No significant chronic ischemic change. 5 mm dense calcification in the left anterior head of  caudate unchanged from the prior study. Probably an area of chronic infection such as granulomatous disease or cysticercosis. No surrounding edema. No other calcifications. Vascular: No hyperdense vessel or unexpected calcification. Skull: Negative Sinuses/Orbits: Mild mucosal edema in the paranasal sinuses. Mastoid sinus clear. Other: None ASPECTS ([place] Stroke Program Early CT Score) - Ganglionic level infarction (caudate, lentiform nuclei, internal capsule, insula, M1-M3 cortex): 7 - Supraganglionic infarction (M4-M6 cortex): 3 Total score (0-10 with 10 being normal): 10 IMPRESSION: 1. Negative for acute infarct. 2. Stable 5 mm calcification left head of caudate likely due to chronic infection. 3. ASPECTS is 10 These results were called by telephone at the time of interpretation on 12/08/2015 at 6:19 pm to Dr. Leonie Man, who verbally acknowledged these results. Electronically Signed   By: Franchot Gallo M.D.   On: 12/08/2015 18:19     Assessment/Plan: Diagnosis: Left MCA infarct with aphasia, apraxia, as well as right upper extremity greater than lower extremity weakness 1. Does the need for close, 24 hr/day medical supervision in concert with the patient's rehab needs make it unreasonable for this patient to be served in a less intensive setting? Yes 2. Co-Morbidities requiring supervision/potential complications: HTN 3. Due to bladder management, bowel management, safety, skin/wound care, disease management, medication administration, pain management and patient education, does the patient require 24 hr/day rehab nursing? Yes 4. Does the patient require coordinated care of a physician, rehab nurse, PT (1-2 hrs/day, 5 days/week), OT (1-2 hrs/day, 5 days/week) and SLP (.5-1 hrs/day, 5 days/week) to address physical and functional deficits in the context  of the above medical diagnosis(es)? Yes Addressing deficits in the following areas: balance, endurance, locomotion, strength, transferring, bowel/bladder  control, bathing, dressing, feeding, grooming, toileting, cognition, speech, language, swallowing and psychosocial support 5. Can the patient actively participate in an intensive therapy program of at least 3 hrs of therapy per day at least 5 days per week? Yes 6. The potential for patient to make measurable gains while on inpatient rehab is excellent 7. Anticipated functional outcomes upon discharge from inpatient rehab are supervision and min assist  with PT, supervision and min assist with OT, min assist and mod assist with SLP. 8. Estimated rehab length of stay to reach the above functional goals is: 18-22d 9. Does the patient have adequate social supports and living environment to accommodate these discharge functional goals? Yes 10. Anticipated D/C setting: Home 11. Anticipated post D/C treatments: [place] therapy 12. Overall Rehab/Functional Prognosis: excellent  RECOMMENDATIONS: This patient's condition is appropriate for continued rehabilitative care in the following setting: CIR Patient has agreed to participate in recommended program. N/A Note that insurance prior authorization may be required for reimbursement for recommended care.  Comment:     12/10/2015    Revision History                        Routing History

## 2015-12-15 NOTE — Progress Notes (Signed)
Pt received from Endo alert and denies any discomfort at this time.  Pt put on monitor..  EP staff in to take pt.

## 2015-12-15 NOTE — Progress Notes (Signed)
  Echocardiogram 2D Echocardiogram has been performed.  Darlina Sicilian M 12/15/2015, 2:17 PM

## 2015-12-15 NOTE — Discharge Summary (Signed)
Stroke Discharge Summary  Patient ID: James Cortez   MRN: [id number]      DOB: 1965/09/25  Date of Admission: 12/08/2015 Date of Discharge: 12/15/2015  Attending Physician:  Rosalin Hawking, MD, Stroke MD Consulting Physician(s):  Treatment Team:  [facility] cardiology, MD rehabilitation medicine Patient's PCP:  No PCP Per Patient  Discharge Diagnoses:  Active Problems:   CVA - left MCA infarct s/p tPA and mechanical thrombectomy and left MCA stent   Acute on chronic respiratory failure - resolved   Essential hypertension   Hyperlipidemia   Smoker    BMI  Body mass index is 25.24 kg/m.  Past Medical History:  Diagnosis Date  . Skin cancer    Past Surgical History:  Procedure Laterality Date  . IR GENERIC HISTORICAL  12/08/2015   IR PERCUTANEOUS ART THROMBECTOMY/INFUSION INTRACRANIAL INC DIAG ANGIO 12/08/2015 Luanne Bras, MD [facility]  . RADIOLOGY WITH ANESTHESIA Right 12/08/2015   Procedure: RADIOLOGY WITH ANESTHESIA - CODE STROKE;  Surgeon: Medication Radiologist, MD;  Location: [facility];  Service: Radiology;  Laterality: Right;  . SKIN SURGERY      Medications to be continued on Rehab . aspirin EC  325 mg Oral Daily  . atorvastatin  20 mg Oral q1800  . clopidogrel  75 mg Oral Q breakfast  . enoxaparin (LOVENOX) injection  40 mg Subcutaneous Q24H  . mouth rinse  15 mL Mouth Rinse 10 times per day    LABORATORY STUDIES CBC    Component Value Date/Time   WBC 11.4 (H) 12/15/2015 0453   RBC 4.61 12/15/2015 0453   HGB 13.9 12/15/2015 0453   HCT 40.3 12/15/2015 0453   PLT 318 12/15/2015 0453   MCV 87.4 12/15/2015 0453   MCH 30.2 12/15/2015 0453   MCHC 34.5 12/15/2015 0453   RDW 12.6 12/15/2015 0453   LYMPHSABS 3.0 12/13/2015 0428   MONOABS 1.1 (H) 12/13/2015 0428   EOSABS 0.2 12/13/2015 0428   BASOSABS 0.0 12/13/2015 0428   CMP    Component Value Date/Time   NA 139 12/15/2015 0453   K 4.1 12/15/2015 0453   CL 109 12/15/2015 0453   CO2 24 12/15/2015  0453   GLUCOSE 98 12/15/2015 0453   BUN 7 12/15/2015 0453   CREATININE 0.67 12/15/2015 0453   CALCIUM 8.9 12/15/2015 0453   PROT 5.8 (L) 12/13/2015 0428   ALBUMIN 2.9 (L) 12/13/2015 0428   ALBUMIN 2.9 (L) 12/13/2015 0428   AST 32 12/13/2015 0428   ALT 45 12/13/2015 0428   ALKPHOS 52 12/13/2015 0428   BILITOT 0.7 12/13/2015 0428   GFRNONAA >60 12/15/2015 0453   GFRAA >60 12/15/2015 0453   COAGS Lab Results  Component Value Date   INR 0.99 12/08/2015   Lipid Panel    Component Value Date/Time   CHOL 159 12/09/2015 0430   TRIG 170 (H) 12/10/2015 0500   HDL 24 (L) 12/09/2015 0430   CHOLHDL 6.6 12/09/2015 0430   VLDL 48 (H) 12/09/2015 0430   LDLCALC 87 12/09/2015 0430   HgbA1C  Lab Results  Component Value Date   HGBA1C 5.6 12/09/2015   Cardiac Panel (last 3 results) No results for input(s): CKTOTAL, CKMB, TROPONINI, RELINDX in the last 72 hours. Urinalysis    Component Value Date/Time   COLORURINE YELLOW 12/08/2015 1856   APPEARANCEUR CLEAR 12/08/2015 1856   LABSPEC 1.022 12/08/2015 1856   PHURINE 8.5 (H) 12/08/2015 1856   GLUCOSEU NEGATIVE 12/08/2015 1856   HGBUR NEGATIVE 12/08/2015 1856  BILIRUBINUR NEGATIVE 12/08/2015 1856   KETONESUR NEGATIVE 12/08/2015 1856   PROTEINUR NEGATIVE 12/08/2015 1856   NITRITE NEGATIVE 12/08/2015 1856   LEUKOCYTESUR NEGATIVE 12/08/2015 1856   Urine Drug Screen No results found for: LABOPIA, COCAINSCRNUR, LABBENZ, AMPHETMU, THCU, LABBARB  Alcohol Level No results found for: [facility]   SIGNIFICANT DIAGNOSTIC STUDIES Dg Abd Portable 1v 12/11/2015 Feeding tube tip overlies the third portion of the duodenum   Cerebral Angiography 12/08/2015 S/P lt common carotid arteriogram,followed by by complete revascularization of occluded Lt MCA m1, with x pass with 4 mm x 40 mm solitaire retrieval device and stent assisted angioplasty of severe underlying stenosis with reocclusion achieving a TICI 2 b revascularization Also received 8 mg of  superselective IA tpa.  MRI Brain Wo Contrast 12/09/2015 1. Patchy multi focal acute left MCA territory infarcts, overall moderate in volume. No associated hemorrhage or significant mass effect. 2. Long segment vascular stent in place within the left M1 segment.  CT Head Wo Contrast 12/09/2015 1. Developing areas of hypoattenuation involving the posterior left lentiform nucleus, the centrum semi ovale, and watershed cortical infarcts without hemorrhage. These represent areas of developing infarct within the left MCA territory of tissue at highest risk. No larger MCA territory infarct is evident. 2. Left MCA stent spanning from the proximal left M1 segment beyond the bifurcation.  CTA Head and Neck 12/08/2015 Head 1. Abrupt occlusion of the proximal left M1 segment, with attenuated distal reconstitution. Finding may reflect a severe high-grade stenosis or possibly partially occlusive and/or recannulized thrombus. Area of occlusion measures approximately 7 mm in length. 2. Otherwise normal CTA of the head. Neck Negative CTA of the neck. No flow-limiting or critical stenosis identified.  Echocardiogram 12/10/2015  Study Conclusions - Left ventricle: The cavity size was normal. Systolic function was normal. The estimated ejection fraction was in the range of 55% to 60%. Wall motion was normal; there were no regional wall motion abnormalities. Left ventricular diastolic function parameters were normal.  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 No evidence of deep vein thrombosis involving the right lower extremity and left lower extremity.  There is no evidence of Baker's cysts bilaterally.   TEE - Normal LV size and function Normal RV size and function Normal RA Normal LA and LA appendage Normal TV with mild TR Normal PV  Normal MV with trivial MR Normal trileaflet AV Normal  interatrial septum with no evidence of shunt by colorflow dopper or agitated saline contrast injection Normal thoracic and ascending aorta with minimal atherosclerotic plaque No evidence of source of embolism    HISTORY OF PRESENT ILLNESS James Cortez is an 50 y.o. male with no known risk factors for stroke other than the back or use, presenting with acute onset of right facial droop, right hemiparesis and difficulty with speech. Patient was last known well at 5:23 PM today. He has no history of stroke nor TIA. He has not been on antiplatelet therapy. CT scan of the head showed no acute  intracranial abnormality. CT angiogram showed acute occlusion of the M1 segment of the left middle cerebral artery. Patient was deemed a candidate for TPA. Initial NIH stroke score was 14. PA was administered and patient showed significant improvement. NIH stroke score went from 14-5. Patient was taken to interventional radiology Cerebral angiogram showed persistent occlusion of left MCA, M1 segment. Endovascular revascularization was elected. LSN: 5:23 PM on 12/08/2015 tPA Given: Yes  HOSPITAL COURSE Mr. Versie Pierre is a 50 y.o. male smoker presenting with right facial droop, right hemiparesis and difficulty with speech. He received IV t-PA 12/08/2015. Revascularization of L M1 with solitaire, stent assisted angioplasty and IA tPA with reocclusion leading to a TICI 2b revascularization. Pt admitted to ICU after procedure. MRI showed left MCA patchy infarcts. Further work up including TTE and LE venous doppler negative. Pt was later extubated and tolerating well. Transfer to step down where he worked closely with PT/OT and further improved before transfer to floor. His right side weakness much improved, but still has expressive aphasia. TEE showed no SOE and loop recorder placed. CIR consulted and discharged to CIR on 12/15/15 in good condition.  Stroke:  left MCA infarct in setting of L M1 occlusion, s/p IV tPA and  TICI2b revascularization with mechanical thrombectomy and MCA stent and IA tPA. Infarct secondary to embolic source as pt lack of evidence of atherosclerosis  Resultant - expressive aphasia  Initial CT normal  MRI - Patchy multi focal acute left MCA territory infarcts. Stent within the left M1 segment.  CTA head L M1 occlusion w/ distal reconstitution  CTA neck negative   2D Echo - EF 55-60%. No cardiac source of emboli identified.  LE venous doppler - neg for DVT  TEE normal and no SOE  Loop recorder placed  Hypercoagulable unremarkable so far except homocysteine pending  LDL 87   HgbA1c 5.6  SCDs for VTE prophylaxis  DIET - DYS 1 Room service appropriate? Yes; Fluid consistency: Nectar Thick  No antithrombotic prior to admission, now on aspirin 325 mg daily and clopidogrel 75 mg daily following  300 mg plavix load.  Ongoing aggressive stroke risk factor management  Therapy recommendations:  CIR recommended  Disposition:  CIR today  Acute respiratory failure  Intubated for neurointervention; now extubated  Pt tolerating well  Hyperlipidemia  Home meds:  No statin   LDL 87, goal < 70  On lipitor 20mg   Continue Lipitor at 20 mg daily  Tobacco abuse  Current smoker  Smoking cessation counseling provided  Pt is willing to quit  Other Stroke Risk Factors  ETOH use, advised to drink no more than 2 drink(s) a day  Other Active Problems  Leukocytosis 19.7 -> 14.5 -> 11.9 (temp 98.1)  Hypokalemia   DISCHARGE EXAM Blood pressure (!) 100/56, pulse 63, temperature 98.2 F (36.8 C), temperature source Oral, resp. rate 18, height 5\' 10"  (1.778 m), weight 175 lb 14.8 oz (79.8 kg), SpO2 97 %.  General - Well nourished, well developed, in no apparent distress.  Ophthalmologic - Fundi not visualized due to noncooperation.  Cardiovascular - Regular rate and rhythm.  Mental Status -  Awake alert, difficulty with orientation questions due to  aphasia. Language exam showed expressive aphasia, able to follow simple commands, naming impaired but able to repeat.  Cranial Nerves II - XII - II - Visual field intact OU. III, IV, VI - Extraocular movements intact. V - Facial sensation intact bilaterally. VII - Facial movement intact bilaterally. VIII - Hearing & vestibular intact bilaterally. X - Palate elevates symmetrically. XI - Chin turning & shoulder shrug intact bilaterally. XII - Tongue protrusion intact.  Motor Strength - The patient's strength was 4+/5 in all extremities and pronator drift was absent.  Bulk was normal and fasciculations were absent.   Motor Tone - Muscle tone was assessed at the neck and appendages and was normal.  Reflexes - The patient's reflexes were 1+ in  all extremities and he had no pathological reflexes.  Sensory - Light touch, temperature/pinprick were assessed and were symmetrical.    Coordination - The patient had normal movements in the hands with no ataxia or dysmetria.  Tremor was absent.  Gait and Station - deferred.  Discharge Diet    DYS 1 Room service appropriate? Yes; Fluid consistency: Nectar Thick  DISCHARGE PLAN  Disposition:  Transfer to [facility] for ongoing PT, OT and ST  aspirin 325 mg daily and clopidogrel 75 mg daily for secondary stroke prevention.  Recommend ongoing risk factor control by Primary Care Physician at time of discharge from inpatient rehabilitation.  Follow-up PCP in 2 weeks following discharge from rehab.  Follow-up with Dr. Rosalin Hawking, Stroke Clinic in 6 weeks, office to schedule an appointment.   40 minutes were spent preparing discharge.  Rosalin Hawking, MD PhD Stroke Neurology 12/15/2015 3:30 PM

## 2015-12-15 NOTE — Progress Notes (Signed)
Interpreter Lesle Chris for Endoscopy

## 2015-12-15 NOTE — PMR Pre-admission (Signed)
PMR Admission Coordinator Pre-Admission Assessment  Patient: James Cortez is an 50 y.o., male MRN: [id number] DOB: December 31, 1965 Height: _0  (177.8 cm) Weight: 79.8 kg (175 lb 14.8 oz)              Insurance Information  PRIMARY:  uninsured      Medicaid Application Date:       Case Manager:  Disability Application Date:       Case Worker:  Portia with financial counselor has arranged for [employer] and Disability applications. Phone [phone]. Probably will be [facility] Medicaid  Emergency Contact Information Contact Information    Name Relation Home Work Dixon Significant other [phone]       Current Medical History  Patient Admitting Diagnosis:  Left MCA infarct  History of Present Illness:    : HPI: Vonnie Floresis a 50 y.o.Spanish limited English-speakingright handed malewith history of tobacco abuse. Present 12/08/2015 with right-sided weakness facial droop and aphasia. Cranial CT scan negative. Patient did receive TPA. CT angiogram showed acute occlusion of the M1 segment of the left middle cerebral artery. 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 No associated hemorrhage. Underwent revascularization with mechanical thrombectomy and MCA stent per interventional radiology. Patient self extubated after procedure. Echocardiogram With ejection fraction of 55-60% no wall motion abnormalities. Currently on Plavix for CVA prophylaxis. TEE Showed normal LV size and function and underwent loop recorder placement 12/15/2015. 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 No evidence of deep vein thrombosis involving bilateral lower extremities. Subcutaneous Lovenox for DVT prophylaxis. Presently on a dysphagia #1 nectar thick liquid diet.   Total: 0 NIH    Past Medical History  Past Medical History:  Diagnosis Date  . Skin cancer     Family History  family  history is not on file.  Prior Rehab/Hospitalizations:  Has the patient had major surgery during 100 days prior to admission? No  Current Medications   Current Facility-Administered Medications:  .  0.9 %  sodium chloride infusion, , Intravenous, Continuous, Rosalin Hawking, MD, Last Rate: 75 mL/hr at 12/15/15 1419, 75 mL/hr at 12/15/15 1419 .  acetaminophen (TYLENOL) tablet 1,000 mg, 1,000 mg, Oral, Q6H PRN **OR** acetaminophen (TYLENOL) suppository 650 mg, 650 mg, Rectal, Q6H PRN, Luanne Bras, MD .  aspirin EC tablet 325 mg, 325 mg, Oral, Daily, David L Rinehuls, PA-C, 325 mg at 12/15/15 1041 .  atorvastatin (LIPITOR) tablet 20 mg, 20 mg, Oral, q1800, Rosalin Hawking, MD .  clopidogrel (PLAVIX) tablet 75 mg, 75 mg, Oral, Q breakfast, Luanne Bras, MD, 75 mg at 12/15/15 0840 .  enoxaparin (LOVENOX) injection 40 mg, 40 mg, Subcutaneous, Q24H, Rosalin Hawking, MD, 40 mg at 12/15/15 0841 .  food thickener (THICK IT) powder, , Oral, PRN, Garvin Fila, MD .  MEDLINE mouth rinse, 15 mL, Mouth Rinse, 10 times per day, Wallie Char, 15 mL at 12/15/15 0200 .  ondansetron (ZOFRAN) injection 4 mg, 4 mg, Intravenous, Q6H PRN, Luanne Bras, MD .  phenol (CHLORASEPTIC) mouth spray 1 spray, 1 spray, Mouth/Throat, PRN, Javier Glazier, MD, 1 spray at 12/11/15 1701  Patients Current Diet:    Precautions / Restrictions Precautions Precautions: Fall Precaution Comments: Rt inattention  Restrictions Weight Bearing Restrictions: No   Has the patient had 2 or more falls or a fall with injury in the past year?No  Prior Activity Level Community (5-7x/wk): worked as a Horticulturist, commercial and independent  Home Assistive Devices / Equipment Home Assistive Devices/Equipment: None Home Equipment: None  Prior Device Use: Indicate devices/aids used by the patient prior to current illness, exacerbation or injury? None of the above  Prior Functional Level Prior Function Level of Independence:  Independent Comments: brick Mason  Self Care: Did the patient need help bathing, dressing, using the toilet or eating?  Independent  Indoor Mobility: Did the patient need assistance with walking from room to room (with or without device)? Independent  Stairs: Did the patient need assistance with internal or external stairs (with or without device)? Independent  Functional Cognition: Did the patient need help planning regular tasks such as shopping or remembering to take medications? Independent  Current Functional Level Cognition  Arousal/Alertness: Awake/alert Overall Cognitive Status: Difficult to assess Difficult to assess due to: Impaired communication Orientation Level: Oriented X4 Following Commands: Follows multi-step commands with increased time, Follows multi-step commands inconsistently General Comments: Pt verbal during today's session. Some intelligible, some not. Seems confused esp when asked questions and responses to questions but difficult to distinguish aphasia vs. language? With cues, able to state he is in the hospital and his full name. Difficulty stating date and remembering how to say 65 in [place]. Ex wife present to help with Spanish. Still mild inattention to RUE, but uses it when cued. Laughs appropriately. Attention: Focused, Sustained Focused Attention: Impaired Focused Attention Impairment: Verbal basic, Functional basic Sustained Attention: Impaired Sustained Attention Impairment: Verbal basic, Functional basic    Extremity Assessment (includes Sensation/Coordination)  Upper Extremity Assessment: RUE deficits/detail RUE Deficits / Details: Full PROM. Limited antigravity ROM but able to hold extremity up against gravity. Decrased grip strength. Increased edema noted.  Lower Extremity Assessment: Defer to PT evaluation RLE Deficits / Details: Grossly ~3+/5 knee extension; difficulty following commands to participate inMMT but able to stand without knee  buckling but weakness noted.  RLE Sensation:  (Difficult to assess due to language deficits vs aphasia.)    ADLs  Overall ADL's : Needs assistance/impaired Grooming: Moderate assistance, Oral care, Standing Grooming Details (indicate cue type and reason): Assist for standing balance; R lean with functional task. Assist for toothpaste but pt attempting to use RUE to participate in activity. Brushed teeth with L hand. Upper Body Bathing: Moderate assistance, Sitting Lower Body Bathing: Maximal assistance, Sit to/from stand Upper Body Dressing : Moderate assistance, Sitting Lower Body Dressing: Maximal assistance, Sit to/from stand Toilet Transfer: Moderate assistance, +2 for safety/equipment, Ambulation, BSC Toilet Transfer Details (indicate cue type and reason): Simulated by sit to stand from EOB with functional mobility in room. Functional mobility during ADLs: Moderate assistance, +2 for safety/equipment General ADL Comments: Pt able to follow one step commands with increased time. Difficult to identify if pt with receptive deficits or does not understand command due to language barrier.    Mobility  Overal bed mobility: Needs Assistance Bed Mobility: Supine to Sit Supine to sit: Min guard Sit to supine: Min guard General bed mobility comments: Pt impulsive and unaware of safety    Transfers  Overall transfer level: Needs assistance Equipment used: None Transfers: Sit to/from Stand Sit to Stand: Min guard Stand pivot transfers: Min assist General transfer comment: Min guard to steady in standing, cues to use RUE for functional mobility. Pt impulsive and unsafe. Transferred to chair post ambulation bout.    Ambulation / Gait / Stairs / Wheelchair Mobility  Ambulation/Gait Ambulation/Gait assistance: Museum/gallery curator (Feet): 120 Feet Assistive device: None Gait Pattern/deviations: Step-through pattern, Staggering left,  Staggering right, Drifts right/left, Decreased  stance time - left General Gait Details: Pt impulsive with consistent drifting to the right.  VCs needed to weight-shift to L and for reciprocal arm swing.  Min assist needed to prevent LOB. Gait velocity: decreased Gait velocity interpretation: Below normal speed for age/gender    Posture / Balance Dynamic Sitting Balance Sitting balance - Comments: Pt able to doff and donn socks while sitting EOB with min guard assist - no LOB noted.   Static Standing Balance Single Leg Stance - Right Leg: 30 (30 x 2; LOB several times) Single Leg Stance - Left Leg: 30 (30 x 2; LOB several times) Balance Overall balance assessment: Needs assistance Sitting-balance support: Feet supported Sitting balance-Leahy Scale: Good Sitting balance - Comments: Pt able to doff and donn socks while sitting EOB with min guard assist - no LOB noted.   Standing balance support: Single extremity supported Standing balance-Leahy Scale: Fair Standing balance comment: Able to stand with Min A for balance, right lateral lean noted. Stood at sink and brushed teeth with mod assist for balance.  Single Leg Stance - Right Leg: 30 (30 x 2; LOB several times) Single Leg Stance - Left Leg: 30 (30 x 2; LOB several times) High level balance activites: Side stepping (B side/forward/backward stepping 10 each; then a combination of all 3 B 10 x) High Level Balance Comments: Required Min assist to maintain balance; LOB several times on each LE.  Pt's balance deteriorated with increased fatigue.  Verbal and tactile cues needed for proper sequencing and technique. Pt observed to scissor with LLE during R SLS actvities to right balance.    Special needs/care consideration BiPAP/CPAP  N/a CPM   N/a Continuous Drip IV   N/a Dialysis   N/a Life Vest   N/a Oxygen  N/a Special Bed  N/a Trach Size  N/a Wound Vac (area)  N/a Skin  intact Bowel mgmt: LBM 12/08/2015 incontinent Bladder mgmt: incontinent Diabetic mgmt  N/a Spanish Interpreter  arranged for therapy sessions   Previous Home Environment Living Arrangements: Other relatives (cousin)  Lives With: Friend(s) Available Help at Discharge: Family, Available 24 hours/day (Children's Mother, Brayton Layman and family 24/7) Type of Home: House Home Layout: One level Home Access: Stairs to enter Entrance Stairs-Rails: None Technical brewer of Steps: 3 Bathroom Shower/Tub: Public librarian, Architectural technologist: Standard Home Care Services: No Additional Comments: Plans to d/s to Children's Mother's home, [place] at d/c  Discharge Living Setting Plans for Discharge Living Setting: House (to live with [facility] and his children at d/c) Type of Home at Discharge: House Discharge Home Layout: One level Discharge Home Access: Stairs to enter Entrance Stairs-Rails: None Entrance Stairs-Number of Steps: 3 Discharge Bathroom Shower/Tub: Tub/shower unit, Curtain Discharge Bathroom Toilet: Standard Discharge Bathroom Accessibility: Yes How Accessible: Accessible via walker Does the patient have any problems obtaining your medications?: Yes (Describe) (no insurance)  Social/Family/Support Systems Patient Roles: Production manager, Armed forces technical officer, Other (Comment) (employee) Contact Information: Monica, significant other Anticipated Caregiver: Scientist, forensic and family Anticipated Ambulance person Information: see above Ability/Limitations of Caregiver: Karie Chimera works in a [employer] but plans to change days with other family members to assist Caregiver Availability: 24/7 Discharge Plan Discussed with Primary Caregiver: Yes Is Caregiver In Agreement with Plan?: Yes Does Caregiver/Family have Issues with Lodging/Transportation while Pt is in Rehab?: No  Goals/Additional Needs Patient/Family Goal for Rehab: supervision to min assist with PT, OT, and SLP Expected length of stay: [facility] 18-22 days Cultural Considerations: Hispanic Special Service Needs: Interpreter  to be arranged for therapy and as needed for  Nursing and Medical Pt/Family Agrees to Admission and willing to participate: Yes Program Orientation Provided & Reviewed with Pt/Caregiver Including Roles  & Responsibilities: Yes  Decrease burden of Care through IP rehab admission: n/a  Possible need for SNF placement upon discharge:not anticipated  Patient Condition: This patient's medical and functional status has changed since the consult dated: 12/11/2015 in which the Rehabilitation Physician determined and documented that the patient's condition is appropriate for intensive rehabilitative care in an inpatient rehabilitation facility. See "History of Present Illness" (above) for medical update. Functional changes are: overall min to mod assist. Patient's medical and functional status update has been discussed with the Rehabilitation physician and patient remains appropriate for inpatient rehabilitation. Will admit to inpatient rehab today.   Preadmission Screen Completed By:  Cleatrice Burke, 12/15/2015 3:56 PM ______________________________________________________________________   Discussed status with Dr. Naaman Plummer on 12/15/2015 at  1557 and received telephone approval for admission today.  Admission Coordinator:  Cleatrice Burke, time 4799 Date 12/15/2015

## 2015-12-15 NOTE — Progress Notes (Signed)
James Gong, RN Rehab Admission Coordinator Signed Physical Medicine and Rehabilitation  PMR Pre-admission Date of Service: 12/15/2015 3:08 PM  Related encounter: ED to Hosp-Admission (Current) from 12/08/2015 in [place]       '[]'$ Hide copied text PMR Admission Coordinator Pre-Admission Assessment  Patient: James Cortez is an 50 y.o., male MRN: [id number] DOB: [birth year] Height: '5\' 10"'$  (177.8 cm) Weight: 79.8 kg (175 lb 14.8 oz)                                                                                                                                                  Insurance Information  PRIMARY:  uninsured      Medicaid Application Date:       Case Manager:  Disability Application Date:       Case Worker:  Comptroller with financial counselor has arranged for Medicaid and Disability applications. Phone [phone]. Probably will be [facility] Medicaid  Emergency Contact Information        Contact Information    Name Relation Home Work Leland Significant other [phone]       Current Medical History  Patient Admitting Diagnosis:  Left MCA infarct  History of Present Illness:    : HPI: James Floresis a 50 y.o.Spanish limited English-speakingright handed malewith history of tobacco abuse. Present 12/08/2015 with right-sided weakness facial droop and aphasia. Cranial CT scan negative. Patient did receive TPA. CT angiogram showed acute occlusion of the M1 segment of the left middle cerebral artery. 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 No associated hemorrhage. Underwent revascularization with mechanical thrombectomy and MCA stentper interventional radiology. Patient self extubated after procedure. Echocardiogram With ejection fraction of 55-60% no wall motion abnormalities. Currently on Plavix for CVA prophylaxis.TEE Showed normal LV size and function and underwentloop recorderplacement11/08/2015.  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 No evidence of deep vein thrombosis involving bilateral lower extremities. Subcutaneous Lovenox for DVT prophylaxis. Presently on a dysphagia #1 nectar thick liquid diet.   Total: 0 NIH    Past Medical History      Past Medical History:  Diagnosis Date  . Skin cancer     Family History  family history is not on file.  Prior Rehab/Hospitalizations:  Has the patient had major surgery during 100 days prior to admission? No  Current Medications   Current Facility-Administered Medications:  .  0.9 %  sodium chloride infusion, , Intravenous, Continuous, Rosalin Hawking, MD, Last Rate: 75 mL/hr at 12/15/15 1419, 75 mL/hr at 12/15/15 1419 .  acetaminophen (TYLENOL) tablet 1,000 mg, 1,000 mg, Oral, Q6H PRN **OR** acetaminophen (TYLENOL) suppository 650 mg, 650 mg, Rectal, Q6H PRN, Luanne Bras, MD .  aspirin EC tablet 325 mg, 325 mg, Oral, Daily, David L Rinehuls,  PA-C, 325 mg at 12/15/15 1041 .  atorvastatin (LIPITOR) tablet 20 mg, 20 mg, Oral, q1800, Rosalin Hawking, MD .  clopidogrel (PLAVIX) tablet 75 mg, 75 mg, Oral, Q breakfast, Luanne Bras, MD, 75 mg at 12/15/15 0840 .  enoxaparin (LOVENOX) injection 40 mg, 40 mg, Subcutaneous, Q24H, Rosalin Hawking, MD, 40 mg at 12/15/15 0841 .  food thickener (THICK IT) powder, , Oral, PRN, Garvin Fila, MD .  MEDLINE mouth rinse, 15 mL, Mouth Rinse, 10 times per day, Wallie Char, 15 mL at 12/15/15 0200 .  ondansetron (ZOFRAN) injection 4 mg, 4 mg, Intravenous, Q6H PRN, Luanne Bras, MD .  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  Independent  Indoor Mobility: Did the patient need assistance with walking from room to room (with or without device)? Independent  Stairs: Did the patient need assistance with internal or external stairs (with or without device)? Independent  Functional Cognition: Did the patient need help planning regular tasks such as shopping or remembering to take medications? Independent  Current Functional Level Cognition Arousal/Alertness: Awake/alert Overall Cognitive Status: Difficult to assess Difficult to assess due to: Impaired communication Orientation Level: Oriented X4 Following Commands: Follows multi-step commands with increased time, Follows multi-step commands inconsistently General Comments: Pt verbal during today's session. Some intelligible, some not. Seems confused esp when asked questions and responses to questions but difficult to distinguish aphasia vs. language? With cues, able to state he is in the hospital and his full name. Difficulty stating date and remembering how to say 61 in [place]. Ex wife present to help with Spanish. Still mild inattention to RUE, but uses it when cued. Laughs appropriately. Attention: Focused, Sustained Focused Attention: Impaired Focused Attention Impairment: Verbal basic, Functional basic Sustained Attention: Impaired Sustained Attention Impairment: Verbal basic, Functional basic      Extremity Assessment (includes Sensation/Coordination) Upper Extremity Assessment: RUE deficits/detail RUE Deficits / Details: Full PROM. Limited antigravity ROM but able to hold extremity up against gravity. Decrased grip strength. Increased edema noted.  Lower Extremity Assessment: Defer to PT evaluation RLE Deficits / Details: Grossly ~3+/5 knee extension; difficulty following commands to participate inMMT but able to stand without knee buckling but weakness noted.  RLE Sensation:  (Difficult to assess due to language deficits vs aphasia.)   ADLs Overall ADL's : Needs assistance/impaired Grooming: Moderate assistance, Oral care, Standing Grooming Details (indicate cue type and reason): Assist for standing balance; R lean with functional task. Assist for toothpaste but pt attempting to use RUE to participate in activity. Brushed teeth with L hand. Upper Body Bathing: Moderate assistance, Sitting Lower Body Bathing: Maximal assistance, Sit to/from stand Upper Body Dressing : Moderate assistance, Sitting Lower Body Dressing: Maximal assistance, Sit to/from stand Toilet Transfer: Moderate assistance, +2  for safety/equipment, Ambulation, BSC Toilet Transfer Details (indicate cue type and reason): Simulated by sit to stand from EOB with functional mobility in room. Functional mobility during ADLs: Moderate assistance, +2 for safety/equipment General ADL Comments: Pt able to follow one step commands with increased time. Difficult to identify if pt with receptive deficits or does not understand command due to language barrier.   Mobility Overal bed mobility: Needs Assistance Bed Mobility: Supine to Sit Supine to sit: Min guard Sit to supine: Min guard General bed mobility comments: Pt impulsive and unaware of safety   Transfers Overall transfer level: Needs assistance Equipment used: None Transfers: Sit to/from Stand Sit to Stand: Min guard Stand pivot transfers: Min assist General transfer  comment: Min guard to steady in standing, cues to use RUE for functional mobility. Pt impulsive and unsafe. Transferred to chair post ambulation bout.   Ambulation / Gait / Stairs / Wheelchair Mobility Ambulation/Gait Ambulation/Gait assistance: Architect (Feet): 120 Feet Assistive device: None Gait Pattern/deviations: Step-through pattern, Staggering left, Staggering right, Drifts right/left, Decreased stance time - left General Gait Details: Pt impulsive with consistent drifting to the right.  VCs needed to weight-shift to L and for reciprocal arm swing.  Min assist needed to prevent LOB. Gait velocity: decreased Gait velocity interpretation: Below normal speed for age/gender   Posture / Balance Dynamic Sitting Balance Sitting balance - Comments: Pt able to doff and donn socks while sitting EOB with min guard assist - no LOB noted.   Static Standing Balance Single Leg Stance - Right Leg: 30 (30 x 2; LOB several times) Single Leg Stance - Left Leg: 30 (30 x 2; LOB several times) Balance Overall balance assessment: Needs assistance Sitting-balance support: Feet supported Sitting balance-Leahy Scale: Good Sitting balance - Comments: Pt able to doff and donn socks while sitting EOB with min guard assist - no LOB noted.   Standing balance support: Single extremity supported Standing balance-Leahy Scale: Fair Standing balance comment: Able to stand with Min A for balance, right lateral lean noted. Stood at sink and brushed teeth with mod assist for balance.  Single Leg Stance - Right Leg: 30 (30 x 2; LOB several times) Single Leg Stance - Left Leg: 30 (30 x 2; LOB several times) High level balance activites: Side stepping (B side/forward/backward stepping 10 each; then a combination of all 3 B 10 x) High Level Balance Comments: Required Min assist to maintain balance; LOB several times on each LE.  Pt's balance deteriorated with increased fatigue.  Verbal and tactile cues  needed for proper sequencing and technique. Pt observed to scissor with LLE during R SLS actvities to right balance.   Special needs/care consideration BiPAP/CPAP  N/a CPM   N/a Continuous Drip IV   N/a Dialysis   N/a Life Vest   N/a Oxygen  N/a Special Bed  N/a Trach Size  N/a Wound Vac (area)  N/a Skin  intact Bowel mgmt: LBM 12/08/2015 incontinent Bladder mgmt: incontinent Diabetic mgmt  N/a Spanish Interpreter arranged for therapy sessions   Previous Home Environment Living Arrangements: Other relatives (cousin)  Lives With: Friend(s) Available Help at Discharge: Family, Available 24 hours/day (Children's Mother, Maxine Glenn and family 24/7) Type of Home: House Home Layout: One level Home Access: Stairs to enter Entrance Stairs-Rails: None Entrance Stairs-Number of Steps: 3 Bathroom Shower/Tub: Hydrographic surveyor, Engineer, building services: Standard Home Care Services: No Additional Comments: Plans to d/s to Children's Mother's home, [place] at d/c  Discharge Living Setting Plans for  Discharge Living Setting: House (to live with [facility] and his children at d/c) Type of Home at Discharge: House Discharge Home Layout: One level Discharge Home Access: Stairs to enter Entrance Stairs-Rails: None Entrance Stairs-Number of Steps: 3 Discharge Bathroom Shower/Tub: Tub/shower unit, Curtain Discharge Bathroom Toilet: Standard Discharge Bathroom Accessibility: Yes How Accessible: Accessible via walker Does the patient have any problems obtaining your medications?: Yes (Describe) (no insurance)  Social/Family/Support Systems Patient Roles: Production manager, Armed forces technical officer, Other (Comment) (employee) Contact Information: Monica, significant other Anticipated Caregiver: Scientist, forensic and family Anticipated Ambulance person Information: see above Ability/Limitations of Caregiver: Karie Chimera works in a [place] but plans to change days with other family members to assist Caregiver Availability: 24/7 Discharge  Plan Discussed with Primary Caregiver: Yes Is Caregiver In Agreement with Plan?: Yes Does Caregiver/Family have Issues with Lodging/Transportation while Pt is in Rehab?: No  Goals/Additional Needs Patient/Family Goal for Rehab: supervision to min assist with PT, OT, and SLP Expected length of stay: [facility] 18-22 days Cultural Considerations: Hispanic Special Service Needs: Interpreter to be arranged for therapy and as needed for Nursing and Medical Pt/Family Agrees to Admission and willing to participate: Yes Program Orientation Provided & Reviewed with Pt/Caregiver Including Roles  & Responsibilities: Yes  Decrease burden of Care through IP rehab admission: n/a  Possible need for SNF placement upon discharge:not anticipated  Patient Condition: This patient's medical and functional status has changed since the consult dated: 12/11/2015 in which the Rehabilitation Physician determined and documented that the patient's condition is appropriate for intensive rehabilitative care in an inpatient rehabilitation facility. See "History of Present Illness" (above) for medical update. Functional changes are: overall min to mod assist. Patient's medical and functional status update has been discussed with the Rehabilitation physician and patient remains appropriate for inpatient rehabilitation. Will admit to inpatient rehab today.   Preadmission Screen Completed By:  Cleatrice Burke, 12/15/2015 3:56 PM ______________________________________________________________________   Discussed status with Dr. Naaman Plummer on 12/15/2015 at  1557 and received telephone approval for admission today.  Admission Coordinator:  Cleatrice Burke, time 8546 Date 12/15/2015       Cosigned by: Meredith Staggers, MD at 12/15/2015 4:55 PM  Revision History

## 2015-12-15 NOTE — H&P (Signed)
Physical Medicine and Rehabilitation Admission H&P    Chief Complaint  Patient presents with  . Code Stroke  : HPI: James Cortez is a 50 y.o. Spanish limited English-speaking right handed male with history of tobacco abuse. Per chart review patient lives with cousin. Independent prior to admission. 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 Present 12/08/2015 with right-sided weakness facial droop and aphasia. Cranial CT scan negative. Patient did receive TPA. CT angiogram showed acute occlusion of the M1 segment of the left middle cerebral artery. 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 No associated hemorrhage. Underwent revascularization with mechanical thrombectomy and MCA stent per interventional radiology. Patient self extubated after procedure. Echocardiogram With ejection fraction of 55-60% no wall motion abnormalities. Currently on Plavix for CVA prophylaxis. TEE Showed normal LV size and function and underwent loop recorder placement 12/15/2015. 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 No evidence of deep vein thrombosis involving bilateral lower extremities. Subcutaneous Lovenox for DVT prophylaxis. Presently on a dysphagia #1 nectar thick liquid diet. Physical therapy evaluation completed 12/10/2015 with recommendations of physical medicine rehabilitation consult. Patient was admitted for a comprehensive rehabilitation program.  ROS Review of Systems  Unable to perform ROS: Language  Aphasic in native language   Past Medical History:  Diagnosis Date  . Skin cancer    Past Surgical History:  Procedure Laterality Date  . IR GENERIC HISTORICAL  12/08/2015   IR PERCUTANEOUS ART THROMBECTOMY/INFUSION INTRACRANIAL INC DIAG ANGIO 12/08/2015 Luanne Bras, MD [facility]  . RADIOLOGY WITH ANESTHESIA Right 12/08/2015   Procedure: RADIOLOGY WITH ANESTHESIA - CODE STROKE;  Surgeon: Medication Radiologist, MD;  Location: [facility];  Service: Radiology;  Laterality: Right;  . SKIN SURGERY     History reviewed. No pertinent family history. Social History:  reports that he has been smoking Cigarettes.  He has been smoking about 1.00 pack per day. He has never used smokeless tobacco. He reports that he drinks alcohol. He reports that he does not use drugs. Allergies: No Known Allergies Medications Prior to Admission  Medication Sig Dispense Refill  . cyclobenzaprine (FLEXERIL) 10 MG tablet Take 1 tablet (10 mg total) by mouth 2 (two) times daily as needed for muscle spasms. 20 tablet 0  . OVER THE COUNTER MEDICATION Place 1-2 drops into both eyes daily as needed (eye irritation).      Home: Home Living Family/patient expects to be discharged to:: Private residence Living Arrangements: Other relatives (cousin) Available Help at Discharge: Family, Available 24 hours/day (Children's Mother, Brayton Layman and family 24/7) Type of Home: House Home Access: Stairs to enter Technical brewer of Steps: 3 Entrance Stairs-Rails: None Home Layout: One level Bathroom Shower/Tub: Tub/shower unit, Architectural technologist: Standard Home Equipment: None Additional Comments: Plans to d/s to [place] at d/c  Lives With: Friend(s)   Functional History: Prior Function Level of Independence: Independent Comments: Horticulturist, commercial  Functional Status:  Mobility: Bed Mobility Overal bed mobility: Needs Assistance Bed Mobility: Supine to Sit Supine to sit: Min guard Sit to supine: Min guard General bed mobility comments: Pt impulsive and unaware of safety Transfers Overall transfer level: Needs assistance Equipment used: None Transfers: Sit to/from Stand Sit to Stand: Min guard Stand pivot transfers: Min assist General transfer comment: Min guard to steady in standing, cues to use RUE for functional  mobility. Pt impulsive and unsafe. Transferred to chair post  ambulation bout. Ambulation/Gait Ambulation/Gait assistance: Min assist Ambulation Distance (Feet): 120 Feet Assistive device: None Gait Pattern/deviations: Step-through pattern, Staggering left, Staggering right, Drifts right/left, Decreased stance time - left General Gait Details: Pt impulsive with consistent drifting to the right.  VCs needed to weight-shift to L and for reciprocal arm swing.  Min assist needed to prevent LOB. Gait velocity: decreased Gait velocity interpretation: Below normal speed for age/gender    ADL: ADL Overall ADL's : Needs assistance/impaired Grooming: Moderate assistance, Oral care, Standing Grooming Details (indicate cue type and reason): Assist for standing balance; R lean with functional task. Assist for toothpaste but pt attempting to use RUE to participate in activity. Brushed teeth with L hand. Upper Body Bathing: Moderate assistance, Sitting Lower Body Bathing: Maximal assistance, Sit to/from stand Upper Body Dressing : Moderate assistance, Sitting Lower Body Dressing: Maximal assistance, Sit to/from stand Toilet Transfer: Moderate assistance, +2 for safety/equipment, Ambulation, BSC Toilet Transfer Details (indicate cue type and reason): Simulated by sit to stand from EOB with functional mobility in room. Functional mobility during ADLs: Moderate assistance, +2 for safety/equipment General ADL Comments: Pt able to follow one step commands with increased time. Difficult to identify if pt with receptive deficits or does not understand command due to language barrier.  Cognition: Cognition Overall Cognitive Status: Difficult to assess Arousal/Alertness: Awake/alert Orientation Level: Oriented X4 Attention: Focused, Sustained Focused Attention: Impaired Focused Attention Impairment: Verbal basic, Functional basic Sustained Attention: Impaired Sustained Attention Impairment: Verbal basic,  Functional basic Cognition Arousal/Alertness: Awake/alert Behavior During Therapy: WFL for tasks assessed/performed Overall Cognitive Status: Difficult to assess Area of Impairment: Orientation, Safety/judgement, Following commands Orientation Level: Disoriented to, Time, Situation Following Commands: Follows multi-step commands with increased time, Follows multi-step commands inconsistently Problem Solving: Slow processing, Difficulty sequencing, Requires verbal cues, Requires tactile cues General Comments: Pt verbal during today's session. Some intelligible, some not. Seems confused esp when asked questions and responses to questions but difficult to distinguish aphasia vs. language? With cues, able to state he is in the hospital and his full name. Difficulty stating date and remembering how to say 51 in [place]. Ex wife present to help with Spanish. Still mild inattention to RUE, but uses it when cued. Laughs appropriately. Difficult to assess due to: Impaired communication  Physical Exam: Blood pressure (!) 100/56, pulse 63, temperature 98.2 F (36.8 C), temperature source Oral, resp. rate 18, height 5\' 10"  (1.778 m), weight 79.8 kg (175 lb 14.8 oz), SpO2 97 %. Physical Exam   Gen: sitting in bed. No distress.  HENT:  Oral mucosa pink and moist Eyes:  Pupils sluggish to light  Neck: Normal range of motion. Neck supple. No thyromegaly present.  Cardiovascular: Normal rate and regular rhythm.  no jvd, no murmurs Respiratory:  Decreased breath sounds at the bases. Chest non-tender/ clear. GI: Soft. Bowel sounds are normal. He exhibits no distension.  Neurological:   Patient with word finding deficits. He follows simple one step commands. Right central 7 and tongue deviation. Some processing delays Right deltoid 3+, biceps/triceps 4, wrist and HI 3+. RLE 4/5 prox to distal. LUE and LLE 4+/5. Decreased pain sense RUE. RLE more receptive to pain. . DTR's 1+. No resting tone.  Skin:  intact Psych: pleasant and cooperative  Results for orders placed or performed during the hospital encounter of 12/08/15 (from the past 48 hour(s))  Glucose, capillary     Status: Abnormal   Collection Time: 12/13/15  7:12 PM  Result Value Ref Range   Glucose-Capillary  109 (H) 65 - 99 mg/dL  Glucose, capillary     Status: Abnormal   Collection Time: 12/13/15 11:20 PM  Result Value Ref Range   Glucose-Capillary 115 (H) 65 - 99 mg/dL  Glucose, capillary     Status: Abnormal   Collection Time: 12/14/15  3:18 AM  Result Value Ref Range   Glucose-Capillary 104 (H) 65 - 99 mg/dL  Lupus anticoagulant panel     Status: None   Collection Time: 12/14/15  3:53 AM  Result Value Ref Range   PTT Lupus Anticoagulant 34.3 0.0 - 51.9 sec   DRVVT 41.6 0.0 - 47.0 sec   Lupus Anticoag Interp Comment:     Comment: (NOTE) No lupus anticoagulant was detected. Performed At: [facility] [place], [place] [id number] Lindon Romp MD [id number]   Beta-2-glycoprotein i abs, IgG/M/A     Status: None   Collection Time: 12/14/15  3:53 AM  Result Value Ref Range   Beta-2 Glyco I IgG <9 0 - 20 GPI IgG units    Comment: (NOTE) The reference interval reflects a 3SD or 99th percentile interval, which is thought to represent a potentially clinically significant result in accordance with the International Consensus Statement on the classification criteria for definitive antiphospholipid syndrome (APS). J Thromb Haem 2006;4:295-306.    Beta-2-Glycoprotein I IgM <9 0 - 32 GPI IgM units    Comment: (NOTE) The reference interval reflects a 3SD or 99th percentile interval, which is thought to represent a potentially clinically significant result in accordance with the International Consensus Statement on the classification criteria for definitive antiphospholipid syndrome (APS). J Thromb Haem 2006;4:295-306. Performed At: [facility] [place], [place]  [id number] Lindon Romp MD [id number]    Beta-2-Glycoprotein I IgA <9 0 - 25 GPI IgA units    Comment: (NOTE) The reference interval reflects a 3SD or 99th percentile interval, which is thought to represent a potentially clinically significant result in accordance with the International Consensus Statement on the classification criteria for definitive antiphospholipid syndrome (APS). J Thromb Haem 2006;4:295-306.   Homocysteine, serum     Status: None   Collection Time: 12/14/15  3:53 AM  Result Value Ref Range   Homocysteine 7.4 0.0 - 15.0 umol/L    Comment: (NOTE) Performed At: [facility] [place], [place] [id number] Lindon Romp MD [id number]   Cardiolipin antibodies, IgG, IgM, IgA     Status: None   Collection Time: 12/14/15  3:53 AM  Result Value Ref Range   Anticardiolipin IgG <9 0 - 14 GPL U/mL    Comment: (NOTE)                          Negative:              <15                          Indeterminate:     15 - 20                          Low-Med Positive: >20 - 80                          High Positive:         >80    Anticardiolipin IgM 10 0 - 12 MPL U/mL  Comment: (NOTE)                          Negative:              <13                          Indeterminate:     13 - 20                          Low-Med Positive: >20 - 80                          High Positive:         >80    Anticardiolipin IgA <9 0 - 11 APL U/mL    Comment: (NOTE)                          Negative:              <12                          Indeterminate:     12 - 20                          Low-Med Positive: >20 - 80                          High Positive:         >80 Performed At: [facility] [address] [place], [place] [id number] Lindon Romp MD VO:[id number]   ANA, IFA (with reflex)     Status: None   Collection Time: 12/14/15  3:53 AM  Result Value Ref Range   ANA Ab, IFA Negative     Comment: (NOTE)                                      Negative   <1:80                                     Borderline  1:80                                     Positive   >1:80 Performed At: [facility] [place], [place] [id number] Lindon Romp MD FI:[id number]   Anti-DNA antibody, double-stranded     Status: None   Collection Time: 12/14/15  3:53 AM  Result Value Ref Range   ds DNA Ab 1 0 - 9 IU/mL    Comment: (NOTE)                                   Negative      <5                                   Equivocal  5 - 9                                   Positive      >9 Performed At: [facility] [place], [place] [id number] Lindon Romp MD SJ:[id number]   Sedimentation rate     Status: Abnormal   Collection Time: 12/14/15  3:53 AM  Result Value Ref Range   Sed Rate 23 (H) 0 - 16 mm/hr  C-reactive protein     Status: Abnormal   Collection Time: 12/14/15  3:53 AM  Result Value Ref Range   CRP 1.9 (H) <1.0 mg/dL  Basic metabolic panel     Status: Abnormal   Collection Time: 12/14/15  3:53 AM  Result Value Ref Range   Sodium 139 135 - 145 mmol/L   Potassium 4.1 3.5 - 5.1 mmol/L   Chloride 107 101 - 111 mmol/L   CO2 23 22 - 32 mmol/L   Glucose, Bld 102 (H) 65 - 99 mg/dL   BUN 6 6 - 20 mg/dL   Creatinine, Ser 0.61 0.61 - 1.24 mg/dL   Calcium 9.1 8.9 - 10.3 mg/dL   GFR calc non Af Amer >60 >60 mL/min   GFR calc Af Amer >60 >60 mL/min    Comment: (NOTE) The eGFR has been calculated using the CKD EPI equation. This calculation has not been validated in all clinical situations. eGFR's persistently <60 mL/min signify possible Chronic Kidney Disease.    Anion gap 9 5 - 15  CBC     Status: Abnormal   Collection Time: 12/14/15  3:53 AM  Result Value Ref Range   WBC 13.9 (H) 4.0 - 10.5 K/uL   RBC 4.66 4.22 - 5.81 MIL/uL   Hemoglobin 14.3 13.0 - 17.0 g/dL   HCT 40.8 39.0 - 52.0 %   MCV 87.6 78.0 - 100.0 fL   MCH 30.7 26.0 - 34.0 pg   MCHC 35.0 30.0 - 36.0 g/dL   RDW  12.6 11.5 - 15.5 %   Platelets 319 150 - 400 K/uL  Glucose, capillary     Status: None   Collection Time: 12/14/15  7:14 AM  Result Value Ref Range   Glucose-Capillary 97 65 - 99 mg/dL   Comment 1 Notify RN    Comment 2 Document in Chart   Glucose, capillary     Status: Abnormal   Collection Time: 12/14/15 11:37 AM  Result Value Ref Range   Glucose-Capillary 120 (H) 65 - 99 mg/dL   Comment 1 Notify RN    Comment 2 Document in Chart   Glucose, capillary     Status: None   Collection Time: 12/14/15  4:22 PM  Result Value Ref Range   Glucose-Capillary 98 65 - 99 mg/dL   Comment 1 Notify RN    Comment 2 Document in Chart   Glucose, capillary     Status: Abnormal   Collection Time: 12/14/15  8:00 PM  Result Value Ref Range   Glucose-Capillary 102 (H) 65 - 99 mg/dL   Comment 1 Notify RN    Comment 2 Document in Chart   Glucose, capillary     Status: Abnormal   Collection Time: 12/14/15 11:35 PM  Result Value Ref Range   Glucose-Capillary 129 (H) 65 - 99 mg/dL   Comment 1 Notify RN    Comment 2 Document in Chart   Glucose, capillary  Status: None   Collection Time: 12/15/15  3:30 AM  Result Value Ref Range   Glucose-Capillary 85 65 - 99 mg/dL   Comment 1 Notify RN    Comment 2 Document in Chart   Basic metabolic panel     Status: None   Collection Time: 12/15/15  4:53 AM  Result Value Ref Range   Sodium 139 135 - 145 mmol/L   Potassium 4.1 3.5 - 5.1 mmol/L   Chloride 109 101 - 111 mmol/L   CO2 24 22 - 32 mmol/L   Glucose, Bld 98 65 - 99 mg/dL   BUN 7 6 - 20 mg/dL   Creatinine, Ser 0.67 0.61 - 1.24 mg/dL   Calcium 8.9 8.9 - 10.3 mg/dL   GFR calc non Af Amer >60 >60 mL/min   GFR calc Af Amer >60 >60 mL/min    Comment: (NOTE) The eGFR has been calculated using the CKD EPI equation. This calculation has not been validated in all clinical situations. eGFR's persistently <60 mL/min signify possible Chronic Kidney Disease.    Anion gap 6 5 - 15  CBC     Status: Abnormal    Collection Time: 12/15/15  4:53 AM  Result Value Ref Range   WBC 11.4 (H) 4.0 - 10.5 K/uL   RBC 4.61 4.22 - 5.81 MIL/uL   Hemoglobin 13.9 13.0 - 17.0 g/dL   HCT 40.3 39.0 - 52.0 %   MCV 87.4 78.0 - 100.0 fL   MCH 30.2 26.0 - 34.0 pg   MCHC 34.5 30.0 - 36.0 g/dL   RDW 12.6 11.5 - 15.5 %   Platelets 318 150 - 400 K/uL  Glucose, capillary     Status: None   Collection Time: 12/15/15  7:40 AM  Result Value Ref Range   Glucose-Capillary 75 65 - 99 mg/dL  Glucose, capillary     Status: None   Collection Time: 12/15/15 11:49 AM  Result Value Ref Range   Glucose-Capillary 94 65 - 99 mg/dL  Glucose, capillary     Status: None   Collection Time: 12/15/15  4:14 PM  Result Value Ref Range   Glucose-Capillary 94 65 - 99 mg/dL   No results found.     Medical Problem List and Plan: 1.  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 Status post loop recorder  -admit to inpatient rehab today 2.  DVT Prophylaxis/Anticoagulation: Subcutaneous Lovenox. Monitor platelet counts for any signs of bleeding 3. Pain Management: Tylenol as needed 4. Aphasia/dysphagia.Dysphagia #1 nectar thick liquids. Follow-up speech therapy  -encourage adequate po intake given diet 5. Neuropsych: This patient is not capable of making decisions on his own behalf. 6. Skin/Wound Care: Routine skin checks 7. Fluids/Electrolytes/Nutrition: Routine I&O with follow-up chemistries upon admission tomorrow 8.Tobacco abuse.Counseling        Meredith Staggers, MD, [facility] 12/15/2015  12/15/2015

## 2015-12-15 NOTE — Progress Notes (Signed)
I await TEE and loop placement today and then if pt in agreement to inpt rehab admission, I will make those arrangements. Pt down for procedure now. [id number]

## 2015-12-15 NOTE — Progress Notes (Signed)
Physical Therapy Treatment Patient Details Name: James Cortez MRN: [id number] DOB: 08/18/1965 Today's Date: 12/15/2015    History of Present Illness Patient is a 50 y/o male with hx of skin cancer presents with right sided weakness and aphasia. MRI-  Patchy multi focal acute left MCA territory infarcts; occlusion proximal Left M1 segment s/p tPA with catheter directed revascularization and angioplasty.    PT Comments    The pt continues to exhibit deficits in maintaining balance.  He lost balance several time during balance activities, and he continues to have R side neglect.  Pt ambulated further, but still with an unsteady gait.  Pt will benefit with continued PT to increase his functional independence.  Continue with POC focusing on balance activities.  Follow Up Recommendations  CIR     Equipment Recommendations  None recommended by PT    Recommendations for Other Services       Precautions / Restrictions Precautions Precautions: Fall Precaution Comments: Rt inattention  Restrictions Weight Bearing Restrictions: No    Mobility  Bed Mobility Overal bed mobility: Needs Assistance Bed Mobility: Supine to Sit     Supine to sit: Min guard     General bed mobility comments: Pt impulsive and unaware of safety  Transfers Overall transfer level: Needs assistance Equipment used: None Transfers: Sit to/from Stand Sit to Stand: Min guard         General transfer comment: Min guard to steady in standing, cues to use RUE for functional mobility. Pt impulsive and unsafe. Transferred to chair post ambulation bout.  Ambulation/Gait Ambulation/Gait assistance: Min assist Ambulation Distance (Feet): 120 Feet Assistive device: None Gait Pattern/deviations: Step-through pattern;Staggering left;Staggering right;Drifts right/left;Decreased stance time - left   Gait velocity interpretation: Below normal speed for age/gender General Gait Details: Pt impulsive with  consistent drifting to the right.  VCs needed to weight-shift to L and for reciprocal arm swing.  Min assist needed to prevent LOB.   Stairs            Wheelchair Mobility    Modified Rankin (Stroke Patients Only)       Balance Overall balance assessment: Needs assistance Sitting-balance support: Feet supported Sitting balance-Leahy Scale: Good Sitting balance - Comments: Pt able to doff and donn socks while sitting EOB with min guard assist - no LOB noted.       Standing balance-Leahy Scale: Fair   Single Leg Stance - Right Leg: 30 (30 x 2; LOB several times) Single Leg Stance - Left Leg: 30 (30 x 2; LOB several times)         High level balance activites: Side stepping (B side/forward/backward stepping 10 each; then a combination of all 3 B 10 x) High Level Balance Comments: Required Min assist to maintain balance; LOB several times on each LE.  Pt's balance deteriorated with increased fatigue.  Verbal and tactile cues needed for proper sequencing and technique. Pt observed to scissor with LLE during R SLS actvities to right balance.    Cognition Arousal/Alertness: Awake/alert Behavior During Therapy: WFL for tasks assessed/performed Overall Cognitive Status: Difficult to assess Area of Impairment: Orientation;Safety/judgement;Following commands       Following Commands: Follows multi-step commands with increased time;Follows multi-step commands inconsistently     Problem Solving: Slow processing;Difficulty sequencing;Requires verbal cues;Requires tactile cues      Exercises      General Comments        Pertinent Vitals/Pain Pain Assessment: No/denies pain Pain Score: 0-No pain  Home Living                      Prior Function            PT Goals (current goals can now be found in the care plan section) Acute Rehab PT Goals Patient Stated Goal: none stated PT Goal Formulation: With patient Time For Goal Achievement: 12/24/15 Potential  to Achieve Goals: Good Progress towards PT goals: Progressing toward goals    Frequency    Min 4X/week      PT Plan Current plan remains appropriate    Co-evaluation             End of Session Equipment Utilized During Treatment: Gait belt Activity Tolerance: Patient tolerated treatment well Patient left: in chair;with call bell/phone within reach;with chair alarm set     Time: [id number] PT Time Calculation (min) (ACUTE ONLY): 25 min  Charges:  $Gait Training: 8-22 mins $Neuromuscular Re-education: 8-22 mins                    G Codes:      Bary Castilla 2015-12-20, 12:37 PM Rito Ehrlich. Nida Boatman Pager: [phone]

## 2015-12-15 NOTE — Interval H&P Note (Signed)
James Cortez was admitted today to Inpatient Rehabilitation with the diagnosis of cva.  The patient's history has been reviewed, patient examined, and there is no change in status.  Patient continues to be appropriate for intensive inpatient rehabilitation.  I have reviewed the patient's chart and labs.  Questions were answered to the patient's satisfaction.    Post Admission Physician Evaluation: 1. 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 2. Patient is admitted to receive collaborative, interdisciplinary care between the physiatrist, rehab nursing staff, and therapy team. 3. Patient's level of medical complexity and substantial therapy needs in context of that medical necessity cannot be provided at a lesser intensity of care such as a SNF. 4. Patient has experienced substantial functional loss from his/her baseline which was documented above under the "Functional History" and "Functional Status" headings.  Judging by the patient's diagnosis, physical exam, and functional history, the patient has potential for functional progress which will result in measurable gains while on inpatient rehab.  These gains will be of substantial and practical use upon discharge  in facilitating mobility and self-care at the household level.  5. The pre-admission screening has been reviewed and the patient remains appropriate for inpatient rehab. 87. Physiatrist will provide 24 hour management of medical needs as well as oversight of the therapy plan/treatment and provide guidance as appropriate regarding the interaction of the two. 7. 24 hour rehab nursing will assist with bladder management, bowel management, safety, skin/wound care, disease management, medication administration, pain management and patient education  and help integrate therapy concepts, techniques,education, etc. 8. PT will assess and treat for/with: Lower extremity strength, range of motion, stamina, balance, functional mobility, safety,  adaptive techniques and equipment, NMR, cognitive-perceptual rx, family education, ego support.   Goals are: mod I to supervision. 9. OT will assess and treat for/with: ADL's, functional mobility, safety, upper extremity strength, adaptive techniques and equipment, NMR, cognitive perceptual awareness, family ed, community reintegration.   Goals are: supervision to mod I. 10. SLP will assess and treat for/with: language, cognition, swallowing.  Goals are: supervision to min assist. 11. Case Management and Social Worker will assess and treat for psychological issues and discharge planning. 12. Team conference will be held weekly to assess progress toward goals and to determine barriers to discharge. 13. Patient will receive at least 3 hours of therapy per day at least 5 days per week. 14. ELOS: 11-16 days       15. Prognosis:  excellent    Joni Norrod T 12/15/2015, 7:01 PM

## 2015-12-15 NOTE — Interval H&P Note (Signed)
History and Physical Interval Note:  12/15/2015 1:01 PM  James Cortez  has presented today for surgery, with the diagnosis of STOKE  The various methods of treatment have been discussed with the patient and family. After consideration of risks, benefits and other options for treatment, the patient has consented to  Procedure(s): TRANSESOPHAGEAL ECHOCARDIOGRAM (TEE) (N/A) as a surgical intervention .  The patient's history has been reviewed, patient examined, no change in status, stable for surgery.  I have reviewed the patient's chart and labs.  Questions were answered to the patient's satisfaction.     Fransico Him

## 2015-12-15 NOTE — Care Management Note (Signed)
Case Management Note  Patient Details  Name: James Cortez MRN: [id number] Date of Birth: 02/20/1965  Subjective/Objective:                    Action/Plan: Pt discharging to CIR today. No further needs per CM.   Expected Discharge Date:                  Expected Discharge Plan:  [place]  In-House Referral:     Discharge planning Services  CM Consult  Post Acute Care Choice:    Choice offered to:     DME Arranged:    DME Agency:     HH Arranged:    [place] Agency:     Status of Service:  Completed, signed off  If discussed at [employer] of Stay Meetings, dates discussed:    Additional Comments:  Pollie Friar, RN 12/15/2015, 3:26 PM

## 2015-12-15 NOTE — Consult Note (Signed)
ELECTROPHYSIOLOGY CONSULT NOTE  Patient ID: James Cortez MRN: [id number], DOB/AGE: Feb 10, 1965   Admit date: 12/08/2015 Date of Consult: 12/15/2015  Primary Physician: No PCP Per Patient Primary Cardiologist: none Reason for Consultation: Cryptogenic stroke ; recommendations regarding Implantable Loop Recorder Requesting MD: James Cortez  History of Present Illness James Cortez was admitted on 12/08/2015 with acute CVA, initially treated with TPA and emergent IR intervention of  occlusion of the M1 segment of the left MCA.  They first developed symptoms of R sided facial droop and R hemiparesis.  Imaging demonstrated left MCA infarct in setting of L M1 occlusion, s/p IV tPA and TICI2b revascularization with mechanical thrombectomy and MCA stent and IA tPA. Infarct secondary to embolic source as pt lack of evidence of atherosclerosis.  he has undergone workup for stroke including echocardiogram and carotid angio.  The patient has been monitored on telemetry which has demonstrated sinus rhythm with no arrhythmias.  Inpatient stroke work-up is to be completed with a TEE.   James Cortez, nursing technician aided in interpretation of Spanish for James Cortez, in-house official translator with myself confirmed that the patient/family had no further questions and understood all the information to their satisfaction.  Echocardiogram this admission demonstrated Study Conclusions - Left ventricle: The cavity size was normal. Systolic function was   normal. The estimated ejection fraction was in the range of 55%   to 60%. Wall motion was normal; there were no regional wall   motion abnormalities. Left ventricular diastolic function   parameters were normal.  Lab work is reviewed.  Prior to admission, the patient denied chest pain, shortness of breath, dizziness, palpitations, or syncope.  They are recovering from their stroke with plans to CIR at discharge.  EP has been asked to evaluate for  placement of an implantable loop recorder to monitor for atrial fibrillation.     Past Medical History:  Diagnosis Date  . Skin cancer      Surgical History:  Past Surgical History:  Procedure Laterality Date  . IR GENERIC HISTORICAL  12/08/2015   IR PERCUTANEOUS ART THROMBECTOMY/INFUSION INTRACRANIAL INC DIAG ANGIO 12/08/2015 James Bras, MD [facility]  . RADIOLOGY WITH ANESTHESIA Right 12/08/2015   Procedure: RADIOLOGY WITH ANESTHESIA - CODE STROKE;  Surgeon: Medication Radiologist, MD;  Location: [facility];  Service: Radiology;  Laterality: Right;  . SKIN SURGERY       Prescriptions Prior to Admission  Medication Sig Dispense Refill Last Dose  . OVER THE COUNTER MEDICATION Place 1-2 drops into both eyes daily as needed (eye irritation).   unknown  . cyclobenzaprine (FLEXERIL) 10 MG tablet Take 1 tablet (10 mg total) by mouth 2 (two) times daily as needed for muscle spasms. 20 tablet 0   . ibuprofen (ADVIL,MOTRIN) 600 MG tablet Take 1 tablet (600 mg total) by mouth every 6 (six) hours as needed. (Patient not taking: Reported on 12/09/2015) 30 tablet 0 Not Taking at Unknown time    Inpatient Medications:  . aspirin EC  325 mg Oral Daily  . atorvastatin  20 mg Oral q1800  . clopidogrel  75 mg Oral Q breakfast  . enoxaparin (LOVENOX) injection  40 mg Subcutaneous Q24H  . mouth rinse  15 mL Mouth Rinse 10 times per day    Allergies: No Known Allergies  Social History   Social History  . Marital status: Married    Spouse name: N/A  . Number of children: N/A  . Years of education: N/A   Occupational  History  . Not on file.   Social History Main Topics  . Smoking status: Current Every Day Smoker    Packs/day: 1.00    Types: Cigarettes  . Smokeless tobacco: Never Used  . Alcohol use Yes     Comment: rare  . Drug use: No  . Sexual activity: Not on file   Other Topics Concern  . Not on file   Social History Narrative  . No narrative on file     History  reviewed. Family history is limited, patient with a degree of aphasia but denies any known family medical history.    Review of Systems: All other systems reviewed and are otherwise negative except as noted above.  Physical Exam: Vitals:   12/14/15 2125 12/15/15 0100 12/15/15 0531 12/15/15 1008  BP: (!) 101/53 (!) 105/55 100/66 101/61  Pulse: 68 66 62 (!) 59  Resp: 18 18  16   Temp: 97.7 F (36.5 C) 97.6 F (36.4 C) 98 F (36.7 C) 98.2 F (36.8 C)  TempSrc: Oral Oral Oral Oral  SpO2: 95% 96% 98% 97%  Weight:   175 lb 14.8 oz (79.8 kg)   Height:        GEN- The patient is well appearing, alert and oriented x 3 today.   Head- normocephalic, atraumatic Eyes-  Sclera clear, conjunctiva pink Ears- hearing intact Oropharynx- clear Neck- supple Lungs- Clear to ausculation bilaterally, normal work of breathing Heart- Regular rate and rhythm, no murmurs, rubs or gallops  GI- soft, NT, ND Extremities- no clubbing, cyanosis, or edema MS- no significant deformity or atrophy Skin- no rash or lesion Psych- euthymic mood, full affect   Labs:   Lab Results  Component Value Date   WBC 11.4 (H) 12/15/2015   HGB 13.9 12/15/2015   HCT 40.3 12/15/2015   MCV 87.4 12/15/2015   PLT 318 12/15/2015    Recent Labs Lab 12/13/15 0428  12/15/15 0453  NA 138  138  < > 139  K 3.5  3.4*  < > 4.1  CL 108  109  < > 109  CO2 22  24  < > 24  BUN 10  10  < > 7  CREATININE 0.61  0.61  < > 0.67  CALCIUM 8.4*  8.5*  < > 8.9  PROT 5.8*  --   --   BILITOT 0.7  --   --   ALKPHOS 52  --   --   ALT 45  --   --   AST 32  --   --   GLUCOSE 118*  118*  < > 98  < > = values in this interval not displayed. No results found for: CKTOTAL, CKMB, CKMBINDEX, TROPONINI Lab Results  Component Value Date   CHOL 159 12/09/2015   Lab Results  Component Value Date   HDL 24 (L) 12/09/2015   Lab Results  Component Value Date   LDLCALC 87 12/09/2015   Lab Results  Component Value Date   TRIG  170 (H) 12/10/2015   TRIG 255 (H) 12/09/2015   TRIG 240 (H) 12/09/2015   Lab Results  Component Value Date   CHOLHDL 6.6 12/09/2015   No results found for: LDLDIRECT  No results found for: DDIMER   Radiology/Studies:  Ct Angio Head W Or Wo Contrast Result Date: 12/08/2015 CLINICAL DATA:  Initial evaluation for acute right-sided weakness, right-sided facial droop. EXAM: CT ANGIOGRAPHY HEAD AND NECK TECHNIQUE: Multidetector CT imaging of the head and neck was performed  using the standard protocol during bolus administration of intravenous contrast. Multiplanar CT image reconstructions and MIPs were obtained to evaluate the vascular anatomy. Carotid stenosis measurements (when applicable) are obtained utilizing NASCET criteria, using the distal internal carotid diameter as the denominator. CONTRAST:  50 cc of Isovue 370. COMPARISON:  Comparison made with prior noncontrast head CT performed earlier on the same day. FINDINGS: CTA NECK FINDINGS Aortic arch: Visualized aortic arch is of normal caliber with normal 3 vessel morphology. No high-grade stenosis at the origin of the great vessels. Visualized subclavian arteries are widely patent. Right carotid system: Right common carotid artery patent from its origin to the bifurcation. No significant atheromatous plaque about the right bifurcation. Right ICA widely patent from the bifurcation to the skullbase. No stenosis, dissection, or vascular occlusion identified within the right carotid artery system. Right external carotid artery and its branches within normal limits. Left carotid system: Left common carotid artery widely patent from its origin two the bifurcation. No significant atheromatous plaque about the left bifurcation. Left ICA widely patent from the bifurcation to the skullbase. No stenosis, dissection, or vascular occlusion within the left carotid artery system. Left external carotid artery and its branches within normal limits. Vertebral  arteries: Both of the vertebral arteries arise from the subclavian arteries. Vertebral arteries widely patent within the neck without stenosis, dissection, or occlusion. Skeleton: No acute osseous abnormality. No worrisome lytic or blastic osseous lesions. Other neck: Visualized soft tissues of the neck within normal limits without acute abnormality. Thyroid normal. No adenopathy. Upper chest: Visualized upper mediastinum is normal. Visualized lungs are clear. Review of the MIP images confirms the above findings CTA HEAD FINDINGS Anterior circulation: The petrous, cavernous, and supraclinoid segments of the internal carotid arteries are widely patent without flow-limiting stenosis. ICA termini themselves appear to be patent. A1 segments patent. Right A1 segment hypoplastic. Anterior communicating artery normal. Anterior cerebral arch well opacified to their distal aspects. On the left, there is abrupt cough of the left M1 segment, which fever reflects high-grade stenosis or possibly embolic occlusion. Area of occlusion measures approximately 7 mm. There is reconstitution distally, with attenuated flow within the distal left M1 segment and left MCA branches. Right M1 segment widely patent. Right MCA bifurcation normal. Distal right MCA branches well opacified. Posterior circulation: Vertebral arteries are patent to the vertebrobasilar junction. Mild atheromatous irregularity within the left V4 segment without high-grade stenosis. Posterior inferior cerebral arteries patent bilaterally. Basilar artery widely patent. Superior cerebral arteries patent bilaterally. Right PCA supplied via the basilar artery and is well opacified to its distal aspect. There is a fetal type left PCA supplied via a widely patent left posterior communicating artery. Venous sinuses: Grossly patent. Anatomic variants: Fetal type left PCA. No aneurysm or vascular malformation. Delayed phase: Not performed. Review of the MIP images confirms the  above findings IMPRESSION: CTA HEAD IMPRESSION: 1. Abrupt occlusion of the proximal left M1 segment, with attenuated distal reconstitution. Finding may reflect a severe high-grade stenosis or possibly partially occlusive and/or recannulized thrombus. Area of occlusion measures approximately 7 mm in length. Per discussion with Dr. 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 This is not available for review or comparison at time of this dictation. While findings on this CT are felt to be within the M1 segment, it is possible that this lesion is the same lesion discussed on previous arteriogram from 2014. Direct comparison with  previous images recommended if available. 2. Otherwise normal CTA of the head. CTA NECK IMPRESSION: Negative CTA of the neck. No flow-limiting or critical stenosis identified. Critical Value/emergent results were called by telephone at the time of interpretation on 12/08/2015 at 6:22 pm to Dr. Leonie Man , who verbally acknowledged these results. Electronically Signed   By: Jeannine Boga M.D.   On: 12/08/2015 19:05   Ct Head Wo Contrast Result Date: 12/09/2015 CLINICAL DATA:  Progressive right-sided weakness. Status post revascularization of the left middle cerebral artery. EXAM: CT HEAD WITHOUT CONTRAST TECHNIQUE: Contiguous axial images were obtained from the base of the skull through the vertex without intravenous contrast. COMPARISON:  CTA head and neck 12/08/2015 FINDINGS: Brain: A developing area of hypoattenuation is present in the posterior left lentiform nucleus and extending superiorly within the centrum semi of ally. Small nonhemorrhagic cortical infarcts are present in a watershed distribution over the left convexity as well. Previously noted hyperdensity over the left hemisphere likely reflected some element of luxury perfusion. There is no evidence for subarachnoid hemorrhage  on today's study. The right basal ganglia are intact. Focal calcification along the anterior horn of the left lateral ventricle is stable. Vascular: A left M1 stent extends beyond the bifurcation. No significant vascular calcifications are present otherwise. Skull: The calvarium is intact. Sinuses/Orbits: Diffuse mucosal thickening is present throughout the paranasal sinuses. There are no significant fluid levels. The mastoid air cells are clear. IMPRESSION: 1. Developing areas of hypoattenuation involving the posterior left lentiform nucleus, the centrum semi ovale, and watershed cortical infarcts without hemorrhage. These represent areas of developing infarct within the left MCA territory of tissue at highest risk. No larger MCA territory infarct is evident. 2. Left MCA stent spanning from the proximal left M1 segment beyond the bifurcation. Electronically Signed   By: San Morelle M.D.   On: 12/09/2015 09:33    Mr Brain Wo Contrast Result Date: 12/09/2015 CLINICAL DATA:  Initial evaluation for acute stroke, status post tPA with catheter directed revascularization and angioplasty. EXAM: MRI HEAD WITHOUT CONTRAST TECHNIQUE: Multiplanar, multiecho pulse sequences of the brain and surrounding structures were obtained without intravenous contrast. COMPARISON:  Prior CT from earlier same day as well as prior examinations from 12/08/2015. The FINDINGS: Brain: Cerebral volume normal for patient age. No significant cerebral white matter disease. There are patchy multi focal areas of restricted diffusion involving the left MCA territory, compatible with acute left MCA territory infarcts. Specifically, many of these foci are cortical and subcortical in nature, in a somewhat watershed distribution. There is confluent restricted diffusion involving the left caudate and lentiform nuclei. Associated T2/FLAIR signal abnormality with gyral edema present within the areas of infarction. No significant associated  hemorrhage. Susceptibility artifact at the level of the left caudate head is consistent with parenchymal calcification at this level. Left lateral ventricle is mildly attenuated without significant mass effect. No midline shift. No other evidence for acute ischemia. Gray-white matter differentiation otherwise maintained. No other evidence for acute or chronic intracranial hemorrhage. No other areas of chronic infarction. No mass lesion. No hydrocephalus. No extra-axial fluid collection. Major dural sinuses are grossly patent. Apparent FLAIR signal abnormality overlying the dural/posterior aspect of the brain is felt to be artifactual on this exam. Pituitary gland and suprasellar region within normal limits. Vascular: Extent susceptibility artifact within the left M1 segment due to vascular stent. Major intracranial vascular flow voids are otherwise maintained. Skull and upper cervical spine: Craniocervical junction normal. Visualized upper cervical spine unremarkable. Bone marrow  signal intensity within normal limits. No scalp soft tissue abnormality. Sinuses/Orbits: Globes and orbital soft tissues within normal limits. Scattered mucosal thickening throughout the paranasal sinuses. Fluid present within the nasopharynx. Patient appears to be intubated. No mastoid effusion. Inner ear structures grossly normal. IMPRESSION: 1. Patchy multi focal acute left MCA territory infarcts, overall moderate in volume. No associated hemorrhage or significant mass effect. 2. Long segment vascular stent in place within the left M1 segment. Electronically Signed   By: Jeannine Boga M.D.   On: 12/09/2015 19:23     Ir Percutaneous Art Thrombectomy/infusion Intracranial Inc Diag Angio Result Date: 12/11/2015 CLINICAL DATA:  History of TIAs with subsequent right-sided hemiparaesthesia and aphasia. CT angiogram revealing near complete occlusion of the left middle cerebral artery M1 segment. EXAM: IR PERCUTANEOUS ART  THORMBECTOMY/INFUSION INTRACRANIAL INCLUDE DIAG ANGIO PROCEDURE: Left common carotid arteriogram followed by endovascular revascularization of occluded left middle cerebral artery M1 segment with mechanical thrombectomy, and subsequent rescue balloon angioplasty with stent placement of the left middle cerebral artery for underlying severe symptomatic stenoses. Contrast: Isovue 300 approximately 100 cc. Anesthesia/Sedation:  General anesthesia. Medications: As per general anesthesia. Following a full explanation of the procedure along with the potential associated complications, an informed witnessed consent was obtained from the patient. Risks of intracranial hemorrhage of 10%, worsening neurological deficit, death, ventilator dependency were all reviewed in detail. Also reviewed was the possibly of inability to revascularize. Consent was obtained from the patient. Also, the patient's daughter and wife were informed of the risks as above. The patient was then put under general anesthesia by the Department of Anesthesiology at [facility]. The right groin was prepped and draped in the usual sterile fashion. Thereafter using modified Seldinger technique and ultrasound guidance, access into the right common femoral artery was obtained without difficulty. Over a 0.035 inch guidewire a 5 French Pinnacle sheath was inserted. Through this, and also over a 0.035 inch guidewire a 5 [place] JB 1 catheter was advanced to the aortic arch region and selectively positioned in the left common carotid artery. Arteriograms were then performed centered extra cranially and intracranially. There were no acute complications. The patient tolerated the procedure well. FINDINGS: The left common carotid arteriogram demonstrates the left external carotid artery and its major branches to be widely patent. The left internal carotid artery at the bulb to the cranial skull base is seen to opacify normally. Wide patency is seen at the  petrous, cavernous and the supraclinoid segments. A dominant left posterior communicating artery is seen opacifying the left posterior cerebral artery distribution. The left anterior cerebral artery is seen to opacify into the capillary and venous phases with the shift of the worse area more posteriorly. Cross filling via the anterior communicating artery of the right anterior cerebral A2 segment is seen. The left middle cerebral artery demonstrates complete occlusion in the left M1 segment. The delayed arterial phase demonstrates partial retrograde opacification of the perisylvian branches from the pericallosal and the posterior cerebral P3 leptomeningeal collaterals. ENDOVASCULAR REVASCULARIZATION OF OCCLUDED LEFT MIDDLE CEREBRAL ARTERY USING 1 PASS WITH THE SOLITAIRE FR 4 MM X 40 MM RETRIEVAL DEVICE, WITH RESCUE BALLOON ANGIOPLASTY OF RE OCCLUSION OF THE LEFT MIDDLE CEREBRAL ARTERY FOLLOWED BY PLACEMENT OF INTRACRANIAL STENT ACHIEVING A TICI 2B REPERFUSION. The patient was also given approximately 8 mg of super selective intracranial intra-arterial tPA. The diagnostic JB 1 catheter in the left common carotid artery was then exchanged over a 0.035 inch 300 cm Rosen exchange guidewire with an  8 French 55 cm Brite tip neurovascular sheath using biplane roadmap technique and constant fluoroscopic guidance. Good aspiration obtained from the side port of the neurovascular sheath. A gentle contrast injection demonstrated no evidence of spasms, dissections or of intraluminal filling defects. This was then connected to continuous heparinized saline infusion. Over the [employer] guidewire, an 8 [place] 85 cm FlowGate balloon guide catheter which had been prepped with 50% contrast and 50% heparinized saline infusion was then advanced and positioned just proximal to the left common carotid bifurcation. The guidewire was removed. Good aspiration obtained from the hub of the 8 [place] FlowGate guide catheter. A gentle  contrast injection demonstrated no intraluminal filling defects or of spasm. No change was seen in the intracranial circulation. At this time, in a coaxial manner and with constant heparinized saline infusion using biplane roadmap technique and constant fluoroscopic guidance, a Trevo ProVue 021 micro catheter was advanced over 0.014 inch Softip Synchro micro guidewire to the distal end of the 8 [place] FlowGate guide catheter. With the micro guidewire leading with a J-tip configuration to avoid dissections or inducing inducing spasm, the combination was advanced without difficulty to the supraclinoid left ICA. Thereafter using a torque device, the micro guidewire was then manipulated to advance into the left M1 segment followed by advancement more distally through the occluded left MCA into the M2 M3 inferior division. This was then followed by the micro catheter. There was resistance noted through the occluded left middle cerebral artery. The guidewire was removed. Slow aspiration of blood was noted through the micro catheter, with a gentle micro injection demonstrating stagnation of contrast in the inferior division of the distal M3 M4 branches associated with filling defects. This prompted the use of 5 mg of super selective intracranial intra-arterial tPA over 5 minutes mixed with 5 cc of normal saline. At the end of this, good aspiration obtained from the hub of the micro catheter. A 4 mm x 40 mm Solitaire FR retrieval device was then advanced in a coaxial manner and with constant heparinized saline infusion using biplane roadmap technique and constant fluoroscopic guidance to the distal end of the micro catheter. The entire system was then straightened. The O ring on the delivery micro catheter and the delivery micro guidewire were then loosened. With slight forward gentle traction with the right hand on the delivery retrieval device,, with the left hand, the delivery micro catheter was then unsheathed to just  proximal to the proximal portion of the opened retrieval device. This was then maintained for about two and a half minutes. A control arteriogram performed through the 6 [place] Flowgate guide catheter which had now been advanced to the mid cervical left ICA revealed free flow into the left MCA distribution with a high-grade focal area of narrowing in the mid left M1 segment associated with filling defects. At this time, the proximal portion of the retrieval device was then captured into the delivery micro catheter. The balloon in the left internal carotid carotid was then inflated for proximal flow arrest. With constant aspiration being applied at the hub of the 8 [place] FlowGate guide catheter using a 60 mL syringe, the combination of the retrieval device and the micro catheter was then gently retrieved. Resistance was encountered at the hub of the 8 [place] FlowGate guide catheter. Aspiration was continued as the flow arrest was eliminated with deflation of the balloon in the left internal carotid artery. Back bleed was allowed at the hub of the [place]. A control  arteriogram performed through the 8 [place] FlowGate guide catheter demonstrated now opacification of the left middle cerebral artery with significant narrowing at its origin, and also in the mid M1 segment. However, flow was noted into the inferior division. There remained significant filling defects through the left M1 segment in its entirety. The patient was then given approximately 25 mics of the super selective intracranial nitroglycerin x 2. A control repeat control arteriogram performed improved proximal caliber of the left middle cerebral artery. However, the mid left M1 segment remained patent though with multiple filling defects. Slow flow was already noted in the left MCA distribution. A repeat control arteriogram performed 2-3 minutes later demonstrated complete occlusion of the left middle cerebral artery M1 segment. This prompted the  advancement of the Trevo ProVue micro catheter over a 0.014 inch Softip Synchro micro guidewire as described above. Biplane roadmap technique was seen. The micro catheter was advanced to the proximal occluded portion of the MCA. The guidewire was removed. Approximately 3 mg of super selective intracranial intra-arterial tPA was infused. At the end of this, a control arteriogram performed through the 8 [place] FlowGate guide catheter continued to demonstrate pitiful flow in the pre occlusive left MCA M1 segment. It was therefore decided to proceed with rescue angioplasty of the left middle cerebral artery M1 segment. To this affect, the Trevo Provue micro catheter was advanced over a 0.014 inch Softip Synchro micro guidewire to the distal inferior division of the left MCA M2 M3 region. This was then exchanged for a 0.014 inch Softip 300 cm Transcend exchange micro guidewire using biplane roadmap technique and constant fluoroscopic guidance. The distal end of the micro guidewire with a J-tip configuration to avoid dissections or inducing spasm, during the select exchange maneuver, the tip of the micro guidewire was maintained stable in the M2 M3 region of the left middle cerebral artery. Control arteriogram performed after removal of the micro catheter, demonstrated no patency through the occluded left middle cerebral artery. The 2 mm x 9 mm Gateway balloon micro catheter which had been prepped with 50% contrast and 50% heparinized saline infusion was advanced over the exchange micro guidewire without difficulty and advanced to the area of the severe stenosis. A control balloon angioplasty was then performed with the balloon being inflated to approximately 1.95 mm using micro inflation syringe device via micro tubing. The balloon was left inflated for approximately 20 seconds. Upon deflation, a control arteriogram performed through the 8 [place] FlowGate guide catheter demonstrated no significant improvement in the  occluded left middle cerebral artery occlusion. This balloon was then exchanged for a 2.25 x 15 mm Gateway balloon catheter angioplasty catheter. Again this was advanced as described above after it had been prepped with 50% contrast and 50% heparinized saline infusion. This was then positioned such that the distal and the proximal markers cover the area of a high-grade stenosis. Thereafter, this balloon was then inflated using a micro inflation syringe device via micro tubing to approximately 2.1 mm where it was maintained for about 20 seconds. The balloon was then deflated and slightly retrieved. A control arteriogram performed through the 6 [place] FlowGate guide catheter demonstrated significantly improved caliber with free flow distally. There was a focal area of high-grade stenosis in the distal portion of the left MCA bifurcation. A second control angioplasty was then performed again with the balloon being inflated to approximately 2.1 mm where it was maintained for 20 seconds. Thereafter, this was retrieved. A control arteriogram performed through the 8  [place] FlowGate guide catheter demonstrated free flow into the left MCA distribution. There continued to be now patency of the left middle cerebral artery proximally with a 50% stenosis distally. It was decided to proceed with placement of an intracranial stent to maintain the patency of the angioplastied left middle cerebral artery. Over the exchange micro guidewire, a 5 cm micro catheter was advanced and positioned such that the distal portion of the micro catheter was in the dominant inferior division of the left middle cerebral artery. The exchange micro guidewire was then removed. Free aspiration of blood at the hub of the micro catheter was noted. At this time, a 4 mm x 30 mm Enterprise Codman stent was advanced in a coaxial manner and with constant heparinized saline infusion using biplane roadmap technique and constant fluoroscopic guidance such that  the distal markers of the stent were just proximal to the distal marker on the micro catheter. The entire system was then straightened. After having ascertained the distal landing zone of the stent, the O ring on the delivery micro guidewire was retrieved. The delivery micro catheter was then gently retrieved with excellent apposition distally and also with the proximal apposition being just inside the origin of the left middle cerebral artery. The micro guidewire was then gently retrieved and removed as was the micro catheter. A control arteriogram performed through the 8 [place] FlowGate guide catheter demonstrated complete revascularization of the left middle cerebral artery distribution. No large filling defects were seen intracranially except for in the distal left posterior parietal branch in the M4 region. There was a focal area of hypoperfusion with retrograde filling from the leptomeningeal collaterals. There was no evidence of extravasation or mass-effect on the anterior cerebral artery, the middle cerebral artery or the posterior communicating artery. Throughout the procedure the patient's neurological status and hemodynamic status remained stable. The 8 [place] FlowGate guide catheter and the 8 [place] neurovascular sheath were then retrieved into the abdominal aorta and exchanged over a J-tip guidewire for a 9 [place] Pinnacle sheath. This was down graded to a 7 [place] neurovascular sheath given the occlusive nature of the 9 French sheath. A control arteriogram performed through the sheath demonstrated free antegrade flow into the lower leg. This in turn was then connected to continuous heparinized saline infusion. Distal pulses were Dopplerable at the end of the procedure bilaterally, unchanged from prior to the procedure. The patient was then transferred to the CT scanner for postprocedural CT scan of the brain. The patient was given a loading dose of 350 mg of aspirin and 300 mg of Plavix via an  orogastric tube approximately 20 minutes prior to placement of the intracranial stent. IMPRESSION: Status post endovascular revascularization of occluded left middle cerebral artery M1 segment using 1 pass with the Solitaire FR 4 mm x 40 mm retrieval device, followed by rescue assisted angioplasty as described above with achievement of a TICI 2b reperfusion. The patient also received a total of 8 mg of super selective intracranial intra-arterial tPA as described above. Electronically Signed   By: James Cortez M.D.   On: 12/10/2015 10:41     12-lead ECG SR All prior EKG's in EPIC reviewed with no documented atrial fibrillation  Telemetry SR only  Assessment and Plan:  1. Cryptogenic stroke The patient presents with cryptogenic stroke.  The patient has a TEE planned for this AM.  I spoke at length with the patient about monitoring for afib with either a 30 day event monitor or an implantable  loop recorder.  Risks, benefits, and alteratives to implantable loop recorder were discussed with the patient today.   At this time, the patient is very clear in their decision to proceed with implantable loop recorder.   Wound care was reviewed with the patient (keep incision clean and dry for 3 days).  Wound check scheduled for 12/23/15.  Please call with questions.   Baldwin Jamaica, PA-C 12/15/2015  I have seen and examined this patient with Tommye Standard.  Agree with above, note added to reflect my findings.  On exam, regular rhythm, no murmurs, lungs clear. Presented to the hospital with cryptogenic stroke.  Received tPA and thrombectomy.  Plan for TEE today. If negative, James Cortez plan for LINQ for AF monitoring.  Risks and benefits explained.  Risks include but not limited to bleeding and infection.    Michial Disney M. Talyah Seder MD 12/15/2015 1:53 PM

## 2015-12-15 NOTE — H&P (View-Only) (Signed)
Physical Medicine and Rehabilitation Admission H&P    Chief Complaint  Patient presents with  . Code Stroke  : HPI: James Cortez is a 50 y.o. Spanish limited English-speaking right handed male with history of tobacco abuse. Per chart review patient lives with cousin. Independent prior to admission. 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 Present 12/08/2015 with right-sided weakness facial droop and aphasia. Cranial CT scan negative. Patient did receive TPA. CT angiogram showed acute occlusion of the M1 segment of the left middle cerebral artery. 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 No associated hemorrhage. Underwent revascularization with mechanical thrombectomy and MCA stent per interventional radiology. Patient self extubated after procedure. Echocardiogram With ejection fraction of 55-60% no wall motion abnormalities. Currently on Plavix for CVA prophylaxis. TEE Showed normal LV size and function and underwent loop recorder placement 12/15/2015. 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 No evidence of deep vein thrombosis involving bilateral lower extremities. Subcutaneous Lovenox for DVT prophylaxis. Presently on a dysphagia #1 nectar thick liquid diet. Physical therapy evaluation completed 12/10/2015 with recommendations of physical medicine rehabilitation consult. Patient was admitted for a comprehensive rehabilitation program.  ROS Review of Systems  Unable to perform ROS: Language  Aphasic in native language   Past Medical History:  Diagnosis Date  . Skin cancer    Past Surgical History:  Procedure Laterality Date  . IR GENERIC HISTORICAL  12/08/2015   IR PERCUTANEOUS ART THROMBECTOMY/INFUSION INTRACRANIAL INC DIAG ANGIO 12/08/2015 Luanne Bras, MD [facility]INTER[facility]  . RADIOLOGY WITH ANESTHESIA Right 12/08/2015   Procedure: RADIOLOGY WITH ANESTHESIA - CODE STROKE;  Surgeon: Medication Radiologist, MD;  Location: [facility];  Service: Radiology;  Laterality: Right;  . SKIN SURGERY     History reviewed. No pertinent family history. Social History:  reports that he has been smoking Cigarettes.  He has been smoking about 1.00 pack per day. He has never used smokeless tobacco. He reports that he drinks alcohol. He reports that he does not use drugs. Allergies: No Known Allergies Medications Prior to Admission  Medication Sig Dispense Refill  . cyclobenzaprine (FLEXERIL) 10 MG tablet Take 1 tablet (10 mg total) by mouth 2 (two) times daily as needed for muscle spasms. 20 tablet 0  . OVER THE COUNTER MEDICATION Place 1-2 drops into both eyes daily as needed (eye irritation).      Home: Home Living Family/patient expects to be discharged to:: Private residence Living Arrangements: Other relatives (cousin) Available Help at Discharge: Family, Available 24 hours/day (Children's Mother, Brayton Layman and family 24/7) Type of Home: House Home Access: Stairs to enter Technical brewer of Steps: 3 Entrance Stairs-Rails: None Home Layout: One level Bathroom Shower/Tub: Tub/shower unit, Architectural technologist: Standard Home Equipment: None Additional Comments: Plans to d/s to [place] at d/c  Lives With: Friend(s)   Functional History: Prior Function Level of Independence: Independent Comments: Horticulturist, commercial  Functional Status:  Mobility: Bed Mobility Overal bed mobility: Needs Assistance Bed Mobility: Supine to Sit Supine to sit: Min guard Sit to supine: Min guard General bed mobility comments: Pt impulsive and unaware of safety Transfers Overall transfer level: Needs assistance Equipment used: None Transfers: Sit to/from Stand Sit to Stand: Min guard Stand pivot transfers: Min assist General transfer comment: Min guard to steady in standing, cues to use RUE for functional  mobility. Pt impulsive and unsafe. Transferred to chair post  ambulation bout. Ambulation/Gait Ambulation/Gait assistance: Min assist Ambulation Distance (Feet): 120 Feet Assistive device: None Gait Pattern/deviations: Step-through pattern, Staggering left, Staggering right, Drifts right/left, Decreased stance time - left General Gait Details: Pt impulsive with consistent drifting to the right.  VCs needed to weight-shift to L and for reciprocal arm swing.  Min assist needed to prevent LOB. Gait velocity: decreased Gait velocity interpretation: Below normal speed for age/gender    ADL: ADL Overall ADL's : Needs assistance/impaired Grooming: Moderate assistance, Oral care, Standing Grooming Details (indicate cue type and reason): Assist for standing balance; R lean with functional task. Assist for toothpaste but pt attempting to use RUE to participate in activity. Brushed teeth with L hand. Upper Body Bathing: Moderate assistance, Sitting Lower Body Bathing: Maximal assistance, Sit to/from stand Upper Body Dressing : Moderate assistance, Sitting Lower Body Dressing: Maximal assistance, Sit to/from stand Toilet Transfer: Moderate assistance, +2 for safety/equipment, Ambulation, BSC Toilet Transfer Details (indicate cue type and reason): Simulated by sit to stand from EOB with functional mobility in room. Functional mobility during ADLs: Moderate assistance, +2 for safety/equipment General ADL Comments: Pt able to follow one step commands with increased time. Difficult to identify if pt with receptive deficits or does not understand command due to language barrier.  Cognition: Cognition Overall Cognitive Status: Difficult to assess Arousal/Alertness: Awake/alert Orientation Level: Oriented X4 Attention: Focused, Sustained Focused Attention: Impaired Focused Attention Impairment: Verbal basic, Functional basic Sustained Attention: Impaired Sustained Attention Impairment: Verbal basic,  Functional basic Cognition Arousal/Alertness: Awake/alert Behavior During Therapy: WFL for tasks assessed/performed Overall Cognitive Status: Difficult to assess Area of Impairment: Orientation, Safety/judgement, Following commands Orientation Level: Disoriented to, Time, Situation Following Commands: Follows multi-step commands with increased time, Follows multi-step commands inconsistently Problem Solving: Slow processing, Difficulty sequencing, Requires verbal cues, Requires tactile cues General Comments: Pt verbal during today's session. Some intelligible, some not. Seems confused esp when asked questions and responses to questions but difficult to distinguish aphasia vs. language? With cues, able to state he is in the hospital and his full name. Difficulty stating date and remembering how to say 51 in [place]. Ex wife present to help with Spanish. Still mild inattention to RUE, but uses it when cued. Laughs appropriately. Difficult to assess due to: Impaired communication  Physical Exam: Blood pressure (!) 100/56, pulse 63, temperature 98.2 F (36.8 C), temperature source Oral, resp. rate 18, height 5\' 10"  (1.778 m), weight 79.8 kg (175 lb 14.8 oz), SpO2 97 %. Physical Exam   Gen: sitting in bed. No distress.  HENT:  Oral mucosa pink and moist Eyes:  Pupils sluggish to light  Neck: Normal range of motion. Neck supple. No thyromegaly present.  Cardiovascular: Normal rate and regular rhythm.  no jvd, no murmurs Respiratory:  Decreased breath sounds at the bases. Chest non-tender/ clear. GI: Soft. Bowel sounds are normal. He exhibits no distension.  Neurological:   Patient with word finding deficits. He follows simple one step commands. Right central 7 and tongue deviation. Some processing delays Right deltoid 3+, biceps/triceps 4, wrist and HI 3+. RLE 4/5 prox to distal. LUE and LLE 4+/5. Decreased pain sense RUE. RLE more receptive to pain. . DTR's 1+. No resting tone.  Skin:  intact Psych: pleasant and cooperative  Results for orders placed or performed during the hospital encounter of 12/08/15 (from the past 48 hour(s))  Glucose, capillary     Status: Abnormal   Collection Time: 12/13/15  7:12 PM  Result Value Ref Range   Glucose-Capillary  109 (H) 65 - 99 mg/dL  Glucose, capillary     Status: Abnormal   Collection Time: 12/13/15 11:20 PM  Result Value Ref Range   Glucose-Capillary 115 (H) 65 - 99 mg/dL  Glucose, capillary     Status: Abnormal   Collection Time: 12/14/15  3:18 AM  Result Value Ref Range   Glucose-Capillary 104 (H) 65 - 99 mg/dL  Lupus anticoagulant panel     Status: None   Collection Time: 12/14/15  3:53 AM  Result Value Ref Range   PTT Lupus Anticoagulant 34.3 0.0 - 51.9 sec   DRVVT 41.6 0.0 - 47.0 sec   Lupus Anticoag Interp Comment:     Comment: (NOTE) No lupus anticoagulant was detected. Performed At: [facility] [address] [place], [place] [id number] Mila Homer MD JO:[id number]   Beta-2-glycoprotein i abs, IgG/M/A     Status: None   Collection Time: 12/14/15  3:53 AM  Result Value Ref Range   Beta-2 Glyco I IgG <9 0 - 20 GPI IgG units    Comment: (NOTE) The reference interval reflects a 3SD or 99th percentile interval, which is thought to represent a potentially clinically significant result in accordance with the International Consensus Statement on the classification criteria for definitive antiphospholipid syndrome (APS). J Thromb Haem 2006;4:295-306.    Beta-2-Glycoprotein I IgM <9 0 - 32 GPI IgM units    Comment: (NOTE) The reference interval reflects a 3SD or 99th percentile interval, which is thought to represent a potentially clinically significant result in accordance with the International Consensus Statement on the classification criteria for definitive antiphospholipid syndrome (APS). J Thromb Haem 2006;4:295-306. Performed At: [facility] [address] [place], [place]  [id number] Mila Homer MD SU:[id number]    Beta-2-Glycoprotein I IgA <9 0 - 25 GPI IgA units    Comment: (NOTE) The reference interval reflects a 3SD or 99th percentile interval, which is thought to represent a potentially clinically significant result in accordance with the International Consensus Statement on the classification criteria for definitive antiphospholipid syndrome (APS). J Thromb Haem 2006;4:295-306.   Homocysteine, serum     Status: None   Collection Time: 12/14/15  3:53 AM  Result Value Ref Range   Homocysteine 7.4 0.0 - 15.0 umol/L    Comment: (NOTE) Performed At: [facility] [address] [place], [place] [id number] Mila Homer MD SB:[id number]   Cardiolipin antibodies, IgG, IgM, IgA     Status: None   Collection Time: 12/14/15  3:53 AM  Result Value Ref Range   Anticardiolipin IgG <9 0 - 14 GPL U/mL    Comment: (NOTE)                          Negative:              <15                          Indeterminate:     15 - 20                          Low-Med Positive: >20 - 80                          High Positive:         >80    Anticardiolipin IgM 10 0 - 12 MPL U/mL  Comment: (NOTE)                          Negative:              <13                          Indeterminate:     13 - 20                          Low-Med Positive: >20 - 80                          High Positive:         >80    Anticardiolipin IgA <9 0 - 11 APL U/mL    Comment: (NOTE)                          Negative:              <12                          Indeterminate:     12 - 20                          Low-Med Positive: >20 - 80                          High Positive:         >80 Performed At: [facility] [address] [place], [place] [id number] Lindon Romp MD [id number]:[id number]   ANA, IFA (with reflex)     Status: None   Collection Time: 12/14/15  3:53 AM  Result Value Ref Range   ANA Ab, IFA Negative     Comment: (NOTE)                                      Negative   <1:80                                     Borderline  1:80                                     Positive   >1:80 Performed At: [facility] [place], [place] [id number] Lindon Romp MD [id number]   Anti-DNA antibody, double-stranded     Status: None   Collection Time: 12/14/15  3:53 AM  Result Value Ref Range   ds DNA Ab 1 0 - 9 IU/mL    Comment: (NOTE)                                   Negative      <5                                   Equivocal  5 - 9                                   Positive      >9 Performed At: [facility] [place], [place] [id number] Lindon Romp MD [id number]   Sedimentation rate     Status: Abnormal   Collection Time: 12/14/15  3:53 AM  Result Value Ref Range   Sed Rate 23 (H) 0 - 16 mm/hr  C-reactive protein     Status: Abnormal   Collection Time: 12/14/15  3:53 AM  Result Value Ref Range   CRP 1.9 (H) <1.0 mg/dL  Basic metabolic panel     Status: Abnormal   Collection Time: 12/14/15  3:53 AM  Result Value Ref Range   Sodium 139 135 - 145 mmol/L   Potassium 4.1 3.5 - 5.1 mmol/L   Chloride 107 101 - 111 mmol/L   CO2 23 22 - 32 mmol/L   Glucose, Bld 102 (H) 65 - 99 mg/dL   BUN 6 6 - 20 mg/dL   Creatinine, Ser 0.61 0.61 - 1.24 mg/dL   Calcium 9.1 8.9 - 10.3 mg/dL   GFR calc non Af Amer >60 >60 mL/min   GFR calc Af Amer >60 >60 mL/min    Comment: (NOTE) The eGFR has been calculated using the CKD EPI equation. This calculation has not been validated in all clinical situations. eGFR's persistently <60 mL/min signify possible Chronic Kidney Disease.    Anion gap 9 5 - 15  CBC     Status: Abnormal   Collection Time: 12/14/15  3:53 AM  Result Value Ref Range   WBC 13.9 (H) 4.0 - 10.5 K/uL   RBC 4.66 4.22 - 5.81 MIL/uL   Hemoglobin 14.3 13.0 - 17.0 g/dL   HCT 40.8 39.0 - 52.0 %   MCV 87.6 78.0 - 100.0 fL   MCH 30.7 26.0 - 34.0 pg   MCHC 35.0 30.0 - 36.0 g/dL   RDW  12.6 11.5 - 15.5 %   Platelets 319 150 - 400 K/uL  Glucose, capillary     Status: None   Collection Time: 12/14/15  7:14 AM  Result Value Ref Range   Glucose-Capillary 97 65 - 99 mg/dL   Comment 1 Notify RN    Comment 2 Document in Chart   Glucose, capillary     Status: Abnormal   Collection Time: 12/14/15 11:37 AM  Result Value Ref Range   Glucose-Capillary 120 (H) 65 - 99 mg/dL   Comment 1 Notify RN    Comment 2 Document in Chart   Glucose, capillary     Status: None   Collection Time: 12/14/15  4:22 PM  Result Value Ref Range   Glucose-Capillary 98 65 - 99 mg/dL   Comment 1 Notify RN    Comment 2 Document in Chart   Glucose, capillary     Status: Abnormal   Collection Time: 12/14/15  8:00 PM  Result Value Ref Range   Glucose-Capillary 102 (H) 65 - 99 mg/dL   Comment 1 Notify RN    Comment 2 Document in Chart   Glucose, capillary     Status: Abnormal   Collection Time: 12/14/15 11:35 PM  Result Value Ref Range   Glucose-Capillary 129 (H) 65 - 99 mg/dL   Comment 1 Notify RN    Comment 2 Document in Chart   Glucose, capillary  Status: None   Collection Time: 12/15/15  3:30 AM  Result Value Ref Range   Glucose-Capillary 85 65 - 99 mg/dL   Comment 1 Notify RN    Comment 2 Document in Chart   Basic metabolic panel     Status: None   Collection Time: 12/15/15  4:53 AM  Result Value Ref Range   Sodium 139 135 - 145 mmol/L   Potassium 4.1 3.5 - 5.1 mmol/L   Chloride 109 101 - 111 mmol/L   CO2 24 22 - 32 mmol/L   Glucose, Bld 98 65 - 99 mg/dL   BUN 7 6 - 20 mg/dL   Creatinine, Ser 0.67 0.61 - 1.24 mg/dL   Calcium 8.9 8.9 - 10.3 mg/dL   GFR calc non Af Amer >60 >60 mL/min   GFR calc Af Amer >60 >60 mL/min    Comment: (NOTE) The eGFR has been calculated using the CKD EPI equation. This calculation has not been validated in all clinical situations. eGFR's persistently <60 mL/min signify possible Chronic Kidney Disease.    Anion gap 6 5 - 15  CBC     Status: Abnormal    Collection Time: 12/15/15  4:53 AM  Result Value Ref Range   WBC 11.4 (H) 4.0 - 10.5 K/uL   RBC 4.61 4.22 - 5.81 MIL/uL   Hemoglobin 13.9 13.0 - 17.0 g/dL   HCT 40.3 39.0 - 52.0 %   MCV 87.4 78.0 - 100.0 fL   MCH 30.2 26.0 - 34.0 pg   MCHC 34.5 30.0 - 36.0 g/dL   RDW 12.6 11.5 - 15.5 %   Platelets 318 150 - 400 K/uL  Glucose, capillary     Status: None   Collection Time: 12/15/15  7:40 AM  Result Value Ref Range   Glucose-Capillary 75 65 - 99 mg/dL  Glucose, capillary     Status: None   Collection Time: 12/15/15 11:49 AM  Result Value Ref Range   Glucose-Capillary 94 65 - 99 mg/dL  Glucose, capillary     Status: None   Collection Time: 12/15/15  4:14 PM  Result Value Ref Range   Glucose-Capillary 94 65 - 99 mg/dL   No results found.     Medical Problem List and Plan: 1.  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 Status post loop recorder  -admit to inpatient rehab today 2.  DVT Prophylaxis/Anticoagulation: Subcutaneous Lovenox. Monitor platelet counts for any signs of bleeding 3. Pain Management: Tylenol as needed 4. Aphasia/dysphagia.Dysphagia #1 nectar thick liquids. Follow-up speech therapy  -encourage adequate po intake given diet 5. Neuropsych: This patient is not capable of making decisions on his own behalf. 6. Skin/Wound Care: Routine skin checks 7. Fluids/Electrolytes/Nutrition: Routine I&O with follow-up chemistries upon admission tomorrow 8.Tobacco abuse.Counseling        Meredith Staggers, MD, [facility] 12/15/2015  12/15/2015

## 2015-12-15 NOTE — Progress Notes (Signed)
Pt for a TEE today. Will f/u tomorrow for SLP therapy. Herbie Baltimore, [place] CCC-SLP [phone]

## 2015-12-15 NOTE — CV Procedure (Signed)
    PROCEDURE NOTE:  Procedure:  Transesophageal echocardiogram Operator:  Fransico Him, MD Indications:  CVA Complications: None  During this procedure the patient is administered a total of Versed 3 mg and Fentanyl 50 mg to achieve and maintain moderate conscious sedation.  The patient's heart rate, blood pressure, and oxygen saturation are monitored continuously during the procedure. The period of conscious sedation is 17 minutes, of which I was present face-to-face 100% of this time.  Results: Normal LV size and function Normal RV size and function Normal RA Normal LA and LA appendage Normal TV with mild TR Normal PV  Normal MV with trivial MR Normal trileaflet AV Normal interatrial septum with no evidence of shunt by colorflow dopper or agitated saline contrast injection Normal thoracic and ascending aorta with minimal atherosclerotic plaque  No evidence of source of embolism  The patient tolerated the procedure well and was transferred back to their room in stable condition.  Signed: Fransico Him, MD [facility]

## 2015-12-16 ENCOUNTER — Inpatient Hospital Stay (HOSPITAL_COMMUNITY): Payer: Self-pay | Admitting: Physical Therapy

## 2015-12-16 ENCOUNTER — Telehealth: Payer: Self-pay | Admitting: Physician Assistant

## 2015-12-16 ENCOUNTER — Inpatient Hospital Stay (HOSPITAL_COMMUNITY): Payer: Self-pay | Admitting: Occupational Therapy

## 2015-12-16 ENCOUNTER — Inpatient Hospital Stay (HOSPITAL_COMMUNITY): Payer: Self-pay | Admitting: Speech Pathology

## 2015-12-16 ENCOUNTER — Encounter (HOSPITAL_COMMUNITY): Payer: Self-pay | Admitting: Cardiology

## 2015-12-16 DIAGNOSIS — I63512 Cerebral infarction due to unspecified occlusion or stenosis of left middle cerebral artery: Secondary | ICD-10-CM

## 2015-12-16 LAB — COMPREHENSIVE METABOLIC PANEL
ALBUMIN: 3.5 g/dL (ref 3.5–5.0)
ALK PHOS: 54 U/L (ref 38–126)
ALT: 39 U/L (ref 17–63)
ANION GAP: 8 (ref 5–15)
AST: 24 U/L (ref 15–41)
BUN: 9 mg/dL (ref 6–20)
CALCIUM: 9.1 mg/dL (ref 8.9–10.3)
CHLORIDE: 108 mmol/L (ref 101–111)
CO2: 23 mmol/L (ref 22–32)
Creatinine, Ser: 0.66 mg/dL (ref 0.61–1.24)
GFR calc non Af Amer: >60 mL/min (ref >60)
GLUCOSE: 95 mg/dL (ref 65–99)
Potassium: 4 mmol/L (ref 3.5–5.1)
SODIUM: 139 mmol/L (ref 135–145)
Total Bilirubin: 0.6 mg/dL (ref 0.3–1.2)
Total Protein: 6.5 g/dL (ref 6.5–8.1)

## 2015-12-16 LAB — CBC WITH DIFFERENTIAL/PLATELET
BASOS PCT: 0 %
Basophils Absolute: 0.1 10*3/uL (ref 0.0–0.1)
EOS ABS: 0.2 10*3/uL (ref 0.0–0.7)
EOS PCT: 2 %
HCT: 42.5 % (ref 39.0–52.0)
HEMOGLOBIN: 14.4 g/dL (ref 13.0–17.0)
Lymphocytes Relative: 24 %
Lymphs Abs: 2.9 10*3/uL (ref 0.7–4.0)
MCH: 29.8 pg (ref 26.0–34.0)
MCHC: 33.9 g/dL (ref 30.0–36.0)
MCV: 87.8 fL (ref 78.0–100.0)
Monocytes Absolute: 0.8 10*3/uL (ref 0.1–1.0)
Monocytes Relative: 7 %
NEUTROS PCT: 67 %
Neutro Abs: 7.8 10*3/uL — ABNORMAL HIGH (ref 1.7–7.7)
PLATELETS: 358 10*3/uL (ref 150–400)
RBC: 4.84 MIL/uL (ref 4.22–5.81)
RDW: 12.6 % (ref 11.5–15.5)
WBC: 11.7 10*3/uL — AB (ref 4.0–10.5)

## 2015-12-16 MED ORDER — SENNOSIDES-DOCUSATE SODIUM 8.6-50 MG PO TABS
1.0000 | ORAL_TABLET | Freq: Two times a day (BID) | ORAL | Status: DC
  Administered 2015-12-16 – 2015-12-25 (×19): 1 via ORAL
  Filled 2015-12-16 (×19): qty 1

## 2015-12-16 MED ORDER — INFLUENZA VAC SPLIT QUAD 0.5 ML IM SUSY
0.5000 mL | PREFILLED_SYRINGE | INTRAMUSCULAR | Status: AC
  Administered 2015-12-17: 0.5 mL via INTRAMUSCULAR
  Filled 2015-12-16: qty 0.5

## 2015-12-16 NOTE — Plan of Care (Signed)
Problem: RH Awareness Goal: LTG: Patient will demonstrate intellectual/emergent (PT) LTG: Patient will demonstrate intellectual/emergent/anticipatory awareness with assist during a mobility activity  (PT) In controlled environment

## 2015-12-16 NOTE — Evaluation (Signed)
Occupational Therapy Assessment and Plan  Patient Details  Name: James Cortez MRN: [id number] Date of Birth: 05/31/1965  OT Diagnosis: cognitive deficits, hemiplegia affecting dominant side and muscle weakness (generalized) Rehab Potential: Rehab Potential (ACUTE ONLY): Excellent ELOS: 7 days   Today's Date: 12/16/2015 OT Individual Time: 0905-1000 OT Individual Time Calculation (min): 55 min      Problem List:  Patient Active Problem List   Diagnosis Date Noted  . 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 Left middle cerebral artery stroke (HCC) 12/15/2015  . Oropharyngeal dysphagia   . Aphasia as late effect of stroke   . Right hemiplegia (HCC)   . Acute on chronic respiratory failure (HCC)   . Essential hypertension   . CVA (cerebral vascular accident) (HCC) 12/08/2015    Past Medical History:  Past Medical History:  Diagnosis Date  . Skin cancer    Past Surgical History:  Past Surgical History:  Procedure Laterality Date  . EP IMPLANTABLE DEVICE N/A 12/15/2015   Procedure: Loop Recorder Insertion;  Surgeon: Will Martin Camnitz, MD;  Location: [facility] INVASIVE CV LAB;  Service: Cardiovascular;  Laterality: N/A;  . IR GENERIC HISTORICAL  12/08/2015   IR PERCUTANEOUS ART THROMBECTOMY/INFUSION INTRACRANIAL INC DIAG ANGIO 12/08/2015 Sanjeev Deveshwar, MD [facility]-INTERV RAD  . RADIOLOGY WITH ANESTHESIA Right 12/08/2015   Procedure: RADIOLOGY WITH ANESTHESIA - CODE STROKE;  Surgeon: Medication Radiologist, MD;  Location: [facility] OR;  Service: Radiology;  Laterality: Right;  . SKIN SURGERY      Assessment & Plan Clinical Impression: Patient is a 50 y.o. year old male with recent admission to the hospital on 12/08/2015 with right-sided weakness facial droop and aphasia. Cranial CT scan negative. Patient did receive TPA. CT angiogram showed acute occlusion of the M1 segment of the left middle cerebral artery. MRI showed patchy multifocal acute left  MCA territory infarct. No associated hemorrhage. Underwent revascularization per interventional radiology. Patient self extubated after procedure. Echocardiogram pending. Currently on Plavix for CVA prophylaxis. Physical therapy evaluation completed 12/10/2015 with recommendations of physical medicine rehabilitation consult. Patient transferred to CIR on 12/15/2015 .    Patient currently requires min with basic self-care skills secondary to muscle weakness, impaired timing and sequencing and decreased coordination, decreased attention to right, decreased attention, decreased awareness, decreased problem solving, decreased safety awareness, decreased memory and delayed processing and decreased sitting balance, decreased standing balance and decreased postural control.  Prior to hospitalization, patient could complete ADL and IADL with independent .  Patient will benefit from skilled intervention to increase independence with basic self-care skills prior to discharge home with care partner.  Anticipate patient will require supervision for IADL tasks and Mod I for ADL tasks and follow up home health.  OT - End of Session Endurance Deficit: Yes OT Assessment Rehab Potential (ACUTE ONLY): Excellent OT Patient demonstrates impairments in the following area(s): Balance;Cognition;Endurance;Motor;Safety OT Basic ADL's Functional Problem(s): Grooming;Bathing;Dressing;Toileting;Eating OT Transfers Functional Problem(s): Toilet;Tub/Shower OT Additional Impairment(s): Fuctional Use of Upper Extremity OT Plan OT Intensity: Minimum of 1-2 x/day, 45 to 90 minutes OT Frequency: 5 out of 7 days OT Duration/Estimated Length of Stay: 7 days OT Treatment/Interventions: Balance/vestibular training;Cognitive remediation/compensation;Community reintegration;Discharge planning;DME/adaptive equipment instruction;Functional mobility training;Neuromuscular re-education;Patient/family education;Psychosocial support;Self  Care/advanced ADL retraining;Therapeutic Activities;Therapeutic Exercise;UE/LE Strength taining/ROM;UE/LE Coordination activities;Visual/perceptual remediation/compensation OT Self Feeding Anticipated Outcome(s): Mod I OT Basic Self-Care Anticipated Outcome(s): Mod I OT Toileting Anticipated Outcome(s): Mod I OT Bathroom Transfers Anticipated Outcome(s): Mod I OT Recommendation Patient destination: Home Follow Up   Recommendations: Home health OT Equipment Recommended: To be determined   Skilled Therapeutic Intervention Initial eval completed with treatment provided to address functional transfers,  Functional use of R UE, R side attention, and adapted bathing/dressing skills. Spanish interpreter present throughout session; difficulty responding to questions 2/2 aphasia, able to follow 50%-75% of commands with instructional cues. Pt completed stand-pivot transfers with Min A and Min instructional cues for safety and technique. Ambulated w/ Min A to shower with Mod instructional cues for safety, and min cues to attend to objects on R side. Min A to maintain dynamic balance in shower while washing buttocks with impulsivity noted during ADLs. Pt with weak grip strength on dominant R side and unable to maintain grasp on toothbrush and wash cloth; dropping items frequently.  Pt left seated in w/c at end of session with safety belt on and call bell within reach.   OT Evaluation Precautions/Restrictions  Precautions Precautions: Fall Precaution Comments: Rt inattention  Restrictions Weight Bearing Restrictions: No Pain Pain Assessment Pain Assessment: No/denies pain Home Living/Prior Functioning Home Living Available Help at Discharge: Family, Available 24 hours/day Type of Home: House Home Access: Stairs to enter Entrance Stairs-Number of Steps: 3 Entrance Stairs-Rails: None Home Layout: One level Bathroom Shower/Tub: Tub/shower unit, Curtain Bathroom Toilet: Standard Additional Comments:  per chart the plan is to stay with his children's mother's home  Lives With: Family Prior Function Level of Independence: Independent with gait, Independent with transfers  Able to Take Stairs?: Yes Driving: Yes Vocation: Full time employment Comments: [employer] ADL ADL ADL Comments: Please see functional navigator Vision/Perception  Vision- History Baseline Vision/History: Wears glasses Wears Glasses: Reading only Vision- Assessment Additional Comments: Some R inattention, able to locate items on R with min cues Perception Perception: Within Functional Limits  Cognition Overall Cognitive Status: Impaired/Different from baseline Arousal/Alertness: Awake/alert Orientation Level: Person;Place;Situation Person: Oriented Place: Disoriented Situation: Disoriented Year: Other (Comment) (i don't know) Month: December Day of Week: Incorrect Memory: Impaired Memory Impairment: Decreased recall of new information Decreased Short Term Memory: Verbal basic;Functional basic Immediate Memory Recall: Sock;Bed;Blue Memory Recall: Sock Memory Recall Sock: With Cue Attention: Selective Focused Attention: Appears intact Sustained Attention: Appears intact Sustained Attention Impairment: Verbal basic;Functional basic Selective Attention: Impaired Selective Attention Impairment: Verbal basic;Functional basic Awareness: Impaired Awareness Impairment: Emergent impairment Problem Solving: Impaired Problem Solving Impairment: Verbal basic;Functional basic Executive Function: Self Monitoring;Self Correcting Sequencing Impairment: Functional basic Self Monitoring Impairment: Verbal basic;Functional basic Self Correcting: Impaired Self Correcting Impairment: Verbal basic;Functional basic Behaviors: Impulsive Safety/Judgment: Impaired Sensation Sensation Light Touch: Appears Intact (in LEs) Light Touch Impaired Details: Impaired RUE (hand only) Proprioception: Appears  Intact Coordination Gross Motor Movements are Fluid and Coordinated: No Fine Motor Movements are Fluid and Coordinated: No Finger Nose Finger Test: R coordination deficits Heel Shin Test: R=L Motor  Motor Motor: Abnormal postural alignment and control;Other (comment) Motor - Skilled Clinical Observations: mild RUE hemiparesis  Dynamic Sitting Balance Dynamic Sitting - Balance Support: During functional activity Dynamic Sitting - Level of Assistance: 5: Stand by assistance Dynamic Standing Balance Dynamic Standing - Balance Support: During functional activity Dynamic Standing - Level of Assistance: 4: Min assist Extremity/Trunk Assessment RUE Assessment RUE Assessment: Exceptions to WFL RUE Strength RUE Overall Strength: Deficits RUE Overall Strength Comments: diminished R grip/pinch strength LUE Assessment LUE Assessment: Within Functional Limits  See Function Navigator for Current Functional Status.   Refer to Care Plan for Long Term Goals  Recommendations for other services: None  Discharge Criteria: Patient will be   discharged from OT if patient refuses treatment 3 consecutive times without medical reason, if treatment goals not met, if there is a change in medical status, if patient makes no progress towards goals or if patient is discharged from hospital.  The above assessment, treatment plan, treatment alternatives and goals were discussed and mutually agreed upon: by patient  Valma Cava 12/16/2015, 4:23 PM

## 2015-12-16 NOTE — Progress Notes (Signed)
Social Work Patient ID: James Cortez, male   DOB: 1965-08-30, 50 y.o.   MRN: [id number]  Will need to assess pt when interpreter available or family member. Not able to speak English so will contact a family member To obtain information, for assessment.

## 2015-12-16 NOTE — Progress Notes (Signed)
Social Work Patient ID: James Cortez, male   DOB: [birth year], 49 y.o.   MRN: [id number] MD presented pt at the team conference, therapy team is evaluating and setting goals and target discharge. Will have a formal team conference next Wed.

## 2015-12-16 NOTE — Evaluation (Signed)
Physical Therapy Assessment and Plan  Patient Details  Name: James Cortez MRN: [id number] Date of Birth: [birth year]  PT Diagnosis: Abnormality of gait, Coordination disorder, Hemiparesis dominant, Impaired cognition and Muscle weakness Rehab Potential: Good ELOS: 7-9 days   Today's Date: 12/16/2015 PT Individual Time: 1300-1415 PT Individual Time Calculation (min): 75 min     Problem List: Patient Active Problem List   Diagnosis Date Noted  . 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 Left middle cerebral artery stroke (HCC) 12/15/2015  . Oropharyngeal dysphagia   . Aphasia as late effect of stroke   . Right hemiplegia (HCC)   . Acute on chronic respiratory failure (HCC)   . Essential hypertension   . CVA (cerebral vascular accident) (HCC) 12/08/2015    Past Medical History:  Past Medical History:  Diagnosis Date  . Skin cancer    Past Surgical History:  Past Surgical History:  Procedure Laterality Date  . EP IMPLANTABLE DEVICE N/A 12/15/2015   Procedure: Loop Recorder Insertion;  Surgeon: Will Jorja Loa, MD;  Location: [facility] INVASIVE CV LAB;  Service: Cardiovascular;  Laterality: N/A;  . IR GENERIC HISTORICAL  12/08/2015   IR PERCUTANEOUS ART THROMBECTOMY/INFUSION INTRACRANIAL INC DIAG ANGIO 12/08/2015 Julieanne Cotton, MD [facility]-INTER[facility]  . RADIOLOGY WITH ANESTHESIA Right 12/08/2015   Procedure: RADIOLOGY WITH ANESTHESIA - CODE STROKE;  Surgeon: Medication Radiologist, MD;  Location: [facility] OR;  Service: Radiology;  Laterality: Right;  . SKIN SURGERY      Assessment & Plan Clinical Impression: James Cortez is a 50 y.o. Spanish limited English-speaking right handed male with history of tobacco abuse.  Present 12/08/2015 with right-sided weakness facial droop and aphasia. Cranial CT scan negative. Patient did receive TPA. CT angiogram showed acute occlusion of the M1 segment of the left middle cerebral artery. MRI showed patchy  multifocal acute left MCA territory infarct. No associated hemorrhage. Underwent revascularization with mechanical thrombectomy and MCA stent per interventional radiology. Patient self extubated after procedure. Echocardiogram With ejection fraction of 55-60% no wall motion abnormalities. Currently on Plavix for CVA prophylaxis. TEE Showed normal LV size and function and underwent loop recorder placement 12/15/2015. 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 No evidence of deep vein thrombosis involving bilateral lower extremities. Subcutaneous Lovenox for DVT prophylaxis. Presently on a dysphagia #1 nectar thick liquid diet. Patient transferred to CIR on 12/15/2015 .   Patient currently requires min with mobility secondary to muscle weakness, decreased coordination, decreased attention to right, decreased attention, decreased awareness, decreased problem solving and decreased safety awareness and decreased sitting balance, decreased standing balance, decreased postural control and decreased balance strategies.  Prior to hospitalization, patient was independent  with mobility and lived with Friend(s) in a House home.  Home access is 3Stairs to enter.  Patient will benefit from skilled PT intervention to maximize safe functional mobility, minimize fall risk and decrease caregiver burden for planned discharge home with intermittent assist.  Anticipate patient will benefit from follow up OP at discharge.  PT - End of Session Activity Tolerance: Tolerates 30+ min activity with multiple rests Endurance Deficit: Yes PT Assessment Rehab Potential (ACUTE/IP ONLY): Good Barriers to Discharge: Decreased caregiver support PT Patient demonstrates impairments in the following area(s): Balance;Endurance;Perception;Motor;Safety PT Transfers Functional Problem(s): Bed Mobility;Bed to Chair;Car;Furniture;Floor PT Locomotion  Functional Problem(s): Stairs;Ambulation PT Plan PT Intensity: Minimum of 1-2 x/day ,45 to 90 minutes PT Frequency: 5 out of 7  days PT Duration Estimated Length of Stay: 7-9 days PT Treatment/Interventions: Ambulation/gait training;Balance/vestibular training;Cognitive remediation/compensation;Discharge planning;Community reintegration;DME/adaptive equipment instruction;Functional electrical stimulation;Functional mobility training;Patient/family education;Neuromuscular re-education;Psychosocial support;Splinting/orthotics;Therapeutic Exercise;Therapeutic Activities;Stair training;UE/LE Strength taining/ROM;UE/LE Coordination activities;Visual/perceptual remediation/compensation PT Transfers Anticipated Outcome(s): mod I PT Locomotion Anticipated Outcome(s): mod I home, supervision community, ambulatory PT Recommendation Follow Up Recommendations: Outpatient PT;Other (comment) (intermittent supervision) Patient destination: Home Equipment Recommended: None recommended by PT  Skilled Therapeutic Intervention Pt resting in bed on arrival, no c/o pain and agreeable to therapy session.  PT introduced PT role, goals of therapy, plan of care, safety plan, and estimated length of stay.  After initial assessment PT instructed pt in gait without AD with overall min guard/supervision with verbal cues for attention to R environment, NMR on Nustep at level 4 with 4 extremities 2x5 minutes focus on reciprocal stepping and attention to R hand placement as well as timer, and R attention while scanning hallway for numbered discs in order with max multimodal cues to complete.  PT administered BERG balance scale and patient demonstrates significant fall risk as noted by score of 45/56 on Berg Balance Scale.  Recommend supervision for all mobility at this time.  PT interpreted results for pt and he verbalized understanding.  Pt returned to room at end of session and positioned with call bell in reach and needs met.     PT Evaluation Precautions/Restrictions Precautions Precautions: Fall Precaution Comments: Rt inattention  Restrictions Weight Bearing Restrictions: No Pain Pain Assessment Pain Assessment: No/denies pain Home Living/Prior Functioning Home Living Available Help at Discharge: Family;Available 24 hours/day Type of Home: House Home Access: Stairs to enter [employer] of Steps: 3 Entrance Stairs-Rails: None Home Layout: One level Additional Comments: pt reports he will d/c to his own home, per chart the plan is to stay with his children's mother's home  Lives With: Friend(s) Prior Function Level of Independence: Independent with gait;Independent with transfers  Able to Take Stairs?: Yes Driving: Yes Vocation: Part time employment Comments: Scientist, water quality  Cognition Overall Cognitive Status: Impaired/Different from baseline Arousal/Alertness: Awake/alert Orientation Level: Oriented to person;Oriented to place (oriented to year but not date, needs question cues for orientation to situation) Attention: Sustained Focused Attention: Appears intact Sustained Attention: Impaired Sustained Attention Impairment: Verbal basic;Functional basic Memory: Impaired (per pt report) Awareness: Impaired Awareness Impairment: Intellectual impairment Problem Solving: Impaired Safety/Judgment: Impaired Sensation Sensation Light Touch: Appears Intact (in LEs) Proprioception: Appears Intact Coordination Gross Motor Movements are Fluid and Coordinated: No Fine Motor Movements are Fluid and Coordinated: Yes Heel Shin Test: R=L Motor  Motor Motor: Abnormal postural alignment and control;Other (comment) Motor - Skilled Clinical Observations: mild RUE hemiparesis   Mobility Bed Mobility Bed Mobility: Sit to Supine;Supine to Sit Supine to Sit: 5: Supervision;HOB flat Sit to Supine: 5: Supervision;HOB flat Transfers Transfers: Yes Sit to Stand: 4: Min guard Stand to Sit: 4: Min  guard Locomotion  Ambulation Ambulation: Yes Ambulation/Gait Assistance: 4: Min assist Ambulation Distance (Feet): 170 Feet Assistive device: None Ambulation/Gait Assistance Details: assist for attention to obstacles on R Gait Gait: Yes Gait Pattern: Impaired Gait Pattern: Step-through pattern;Narrow base of support Gait velocity: decreased Stairs / Additional Locomotion Stairs: Yes Stairs Assistance: 4: Min assist Stair Management Technique: One rail Left Number of Stairs: 12 Ramp: 4: Min assist Curb: 4: Min Technical sales engineer Mobility: No  Trunk/Postural Assessment  Cervical Assessment Cervical Assessment: Within Functional Limits Thoracic Assessment Thoracic Assessment: Within Functional Limits Lumbar Assessment Lumbar Assessment: Within Functional Limits Postural Control Postural Control:  Deficits on evaluation Protective Responses: mild deficit  Balance Balance Balance Assessed: Yes Standardized Balance Assessment Standardized Balance Assessment: Berg Balance Test Berg Balance Test Sit to Stand: Able to stand without using hands and stabilize independently Standing Unsupported: Able to stand safely 2 minutes Sitting with Back Unsupported but Feet Supported on Floor or Stool: Able to sit safely and securely 2 minutes Stand to Sit: Controls descent by using hands Transfers: Able to transfer safely, minor use of hands Standing Unsupported with Eyes Closed: Able to stand 10 seconds safely Standing Ubsupported with Feet Together: Able to place feet together independently and stand for 1 minute with supervision From Standing, Reach Forward with Outstretched Arm: Can reach confidently >25 cm (10") From Standing Position, Pick up Object from Floor: Able to pick up shoe, needs supervision From Standing Position, Turn to Look Behind Over each Shoulder: Looks behind one side only/other side shows less weight shift Turn 360 Degrees: Needs close supervision  or verbal cueing Standing Unsupported, Alternately Place Feet on Step/Stool: Able to complete 4 steps without aid or supervision Standing Unsupported, One Foot in Front: Able to plae foot ahead of the other independently and hold 30 seconds Standing on One Leg: Able to lift leg independently and hold 5-10 seconds Total Score: 45 Extremity Assessment      RLE Assessment RLE Assessment: Within Functional Limits RLE Strength RLE Overall Strength Comments: 4+/5 throughout LLE Assessment LLE Assessment: Within Functional Limits LLE Strength LLE Overall Strength Comments: 5/5 throughout   See Function Navigator for Current Functional Status.   Refer to Care Plan for Long Term Goals  Recommendations for other services: None  Discharge Criteria: Patient will be discharged from PT if patient refuses treatment 3 consecutive times without medical reason, if treatment goals not met, if there is a change in medical status, if patient makes no progress towards goals or if patient is discharged from hospital.  The above assessment, treatment plan, treatment alternatives and goals were discussed and mutually agreed upon: by patient  Urban Gibson E Penven-Crew 12/16/2015, 2:19 PM

## 2015-12-16 NOTE — Progress Notes (Signed)
50 y.o.Spanish limited English-speakingright handed malewith history of tobacco abuse. Per chart review patient lives with cousin. Independent prior to admission. Works in [employer].He plans to stay with family and assistance as needed.Present 12/08/2015 with right-sided weakness facial droop and aphasia. Cranial CT scan negative. Patient did receive TPA. CT angiogram showed acute occlusion of the M1 segment of the left middle cerebral artery. 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 No associated hemorrhage. Underwent revascularization with mechanical thrombectomy and MCA stent per interventional radiology. Patient self extubated after procedure. Echocardiogram With ejection fraction of 55-60% no wall motion abnormalities. Currently on Plavix for CVA prophylaxis. TEE Showed normal LV size and function and underwent loop recorder placement 12/15/2015. 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 rate 18, height _0  (1.702 m), weight 74.6 kg (164 lb 7.4 oz), SpO2 96 %. No results found. Results for orders placed or performed during the hospital encounter of 12/15/15 (from the past 72 hour(s))  CBC WITH DIFFERENTIAL     Status: Abnormal   Collection Time: 12/16/15  7:25 AM  Result Value Ref Range   WBC 11.7 (H) 4.0 - 10.5 K/uL   RBC 4.84 4.22 - 5.81 MIL/uL   Hemoglobin 14.4 13.0 - 17.0 g/dL   HCT 42.5 39.0 - 52.0 %   MCV 87.8 78.0 - 100.0 fL   MCH 29.8 26.0 - 34.0 pg   MCHC 33.9 30.0 - 36.0 g/dL   RDW 12.6 11.5 - 15.5 %   Platelets 358 150 - 400 K/uL   Neutrophils Relative % 67 %   Neutro Abs 7.8 (H) 1.7 - 7.7 K/uL   Lymphocytes Relative 24 %   Lymphs Abs 2.9 0.7 -  4.0 K/uL   Monocytes Relative 7 %   Monocytes Absolute 0.8 0.1 - 1.0 K/uL   Eosinophils Relative 2 %   Eosinophils Absolute 0.2 0.0 - 0.7 K/uL   Basophils Relative 0 %   Basophils Absolute 0.1 0.0 - 0.1 K/uL  Comprehensive metabolic panel     Status: None   Collection Time: 12/16/15  7:25 AM  Result Value Ref Range   Sodium 139 135 - 145 mmol/L   Potassium 4.0 3.5 - 5.1 mmol/L   Chloride 108 101 - 111 mmol/L   CO2 23 22 - 32 mmol/L   Glucose, Bld 95 65 - 99 mg/dL   BUN 9 6 - 20 mg/dL   Creatinine, Ser 0.66 0.61 - 1.24 mg/dL   Calcium 9.1 8.9 - 10.3 mg/dL   Total Protein 6.5 6.5 - 8.1 g/dL   Albumin 3.5 3.5 - 5.0 g/dL   AST 24 15 - 41 U/L   ALT 39 17 - 63 U/L   Alkaline Phosphatase 54 38 - 126 U/L   Total Bilirubin 0.6 0.3 - 1.2 mg/dL   GFR calc non Af Amer >60 >60 mL/min   GFR calc Af Amer >60 >60 mL/min    Comment: (NOTE) The eGFR has been calculated using the CKD EPI equation. This calculation has not been validated in all clinical situations. eGFR's persistently <60 mL/min signify possible Chronic Kidney Disease.    Anion gap 8 5 - 15      General: No acute distress Mood and affect are appropriate Heart: Regular rate  and rhythm no rubs murmurs or extra sounds Lungs: Clear to auscultation, breathing unlabored, no rales or wheezes Abdomen: Positive bowel sounds, soft nontender to palpation, nondistended Extremities: No clubbing, cyanosis, or edema Skin: No evidence of breakdown, no evidence of rash Neurologic: Cranial nerves II through XII intact, motor strength is 5/5 in Left  deltoid, bicep, tricep, grip, hip flexor, knee extensors, ankle dorsiflexor and plantar flexor 3/5 on RIght side Sensory exam normal sensation to light touch and proprioception in bilateral upper and lower extremities Names 4/6 simple objects Cerebellar exam reduced finger to nose to finger on RIght     Musculoskeletal: Full range of motion in all 4 extremities. No joint  swelling   Assessment/Plan: 1. Functional deficits secondary to Left MCA infarct which require 3+ hours per day of interdisciplinary therapy in a comprehensive inpatient rehab setting. Physiatrist is providing close team supervision and 24 hour management of active medical problems listed below. Physiatrist and rehab team continue to assess barriers to discharge/monitor patient progress toward functional and medical goals. FIM:                                    Medical Problem List and Plan: 1.  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 Status post loop recorder             -CIR PT, OT, SLP eval 2.  DVT Prophylaxis/Anticoagulation: Subcutaneous Lovenox. Monitor platelet counts for any signs of bleeding 3. Pain Management: Tylenol as needed 4. Aphasia/dysphagia.Dysphagia #1 nectar thick liquids. Follow-up speech therapy             -encourage adequate po intake given diet, ask dietary to eval 5. Neuropsych: This patient is not capable of making decisions on his own behalf. 6. Skin/Wound Care: Routine skin checks 7. Fluids/Electrolytes/Nutrition: Routine I&O with follow-up chemistries upon admission tomorrow, labs ok 8.Tobacco abuse.Counseling  LOS (Days) 1 A FACE TO FACE EVALUATION WAS PERFORMED  Rashena Dowling E 12/16/2015, 9:14 AM

## 2015-12-16 NOTE — Evaluation (Signed)
Speech Language Pathology Assessment and Plan  Patient Details  Name: James Cortez MRN: [id number] Date of Birth: Feb 22, 1965  SLP Diagnosis: Aphasia;Dysphagia;Cognitive Impairments  Rehab Potential: Good ELOS: 7-9 days    Today's Date: 12/16/2015 SLP Individual Time: 1100-1200 SLP Individual Time Calculation (min): 60 min    Problem List:  Patient Active Problem List   Diagnosis Date Noted  . Cerebral infarction due to thrombosis of left middle cerebral artery ([place]) 12/15/2015  . Left middle cerebral artery stroke ([place]) 12/15/2015  . Oropharyngeal dysphagia   . Aphasia as late effect of stroke   . Right hemiplegia ([place])   . Acute on chronic respiratory failure ([place])   . Essential hypertension   . CVA (cerebral vascular accident) ([place]) 12/08/2015   Past Medical History:  Past Medical History:  Diagnosis Date  . Skin cancer    Past Surgical History:  Past Surgical History:  Procedure Laterality Date  . EP IMPLANTABLE DEVICE N/A 12/15/2015   Procedure: Loop Recorder Insertion;  Surgeon: Will Meredith Leeds, MD;  Location: [facility];  Service: Cardiovascular;  Laterality: N/A;  . IR GENERIC HISTORICAL  12/08/2015   IR PERCUTANEOUS ART THROMBECTOMY/INFUSION INTRACRANIAL INC DIAG ANGIO 12/08/2015 Luanne Bras, MD [facility]  . RADIOLOGY WITH ANESTHESIA Right 12/08/2015   Procedure: RADIOLOGY WITH ANESTHESIA - CODE STROKE;  Surgeon: Medication Radiologist, MD;  Location: [facility];  Service: Radiology;  Laterality: Right;  . SKIN SURGERY      Assessment / Plan / Recommendation Clinical Impression Patient is a 50 y.o. Spanish limited English-speaking right handed male with history of tobacco abuse. Per chart review patient lives with cousin. Independent prior to admission. 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 Present 12/08/2015 with right-sided weakness facial droop and aphasia. Cranial CT scan negative. Patient did receive TPA.  CT angiogram showed acute occlusion of the M1 segment of the left middle cerebral artery. 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 No associated hemorrhage. Underwent revascularization with mechanical thrombectomy and MCA stent per interventional radiology. Patient self extubated after procedure. Echocardiogram With ejection fraction of 55-60% no wall motion abnormalities. Currently on Plavix for CVA prophylaxis. TEE Showed normal LV size and function and underwent loop recorder placement 12/15/2015. 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 No evidence of deep vein thrombosis involving bilateral lower extremities. Subcutaneous Lovenox for DVT prophylaxis. Presently on a dysphagia #1 nectar thick liquid diet. Physical therapy evaluation completed 12/10/2015 with recommendations of physical medicine rehabilitation consult. Patient was admitted for a comprehensive rehabilitation program on 12/15/15.  Patient consumed trials of ice chips, water via teaspoon, water via cup, nectar-thick liquid via teaspoon and cup, puree, and mechanical soft solids. Patient with noted overt s/s of aspiration during cup sip of water in the form of immediate throat-clearing x3. Patient demonstrated efficient mastication with multiple swallows of puree and Dys. 2 textures indicative of vallecular residue on solids. Due to impulsivity with self-feeding and hx of silent aspiration on thin liquids, recommend patient continue current diet with trials to determine readiness for follow-up MBS. Patient demonstrates a moderate aphasia characterized by decreased ability to follow 2-step directions, functional word-finding difficulties, and phonemic and semantic paraphasias at the word and phrase level. 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 Patient also demonstrates mild cognitive deficits in the form of impaired selective attention,  problem-solving, awareness, and overall safety. Recommend skilled SLP intervention to maximize cognitive-linguistic and  swallowing function in order to maximize functional independence prior to discharge.    Skilled Therapeutic Interventions          BSE and cognitive-linguistic evaluation administered, see above for details.   SLP Assessment  Patient will need skilled Speech Lanaguage Pathology Services during CIR admission    Recommendations  SLP Diet Recommendations: Dysphagia 1 (Puree);Nectar Liquid Administration via: Spoon Medication Administration: Crushed with puree Supervision: Patient able to self feed;Full supervision/cueing for compensatory strategies Compensations: Minimize environmental distractions;Slow rate;Small sips/bites;Multiple dry swallows after each bite/sip Postural Changes and/or Swallow Maneuvers: Seated upright 90 degrees;Upright 30-60 min after meal Oral Care Recommendations: Oral care BID Patient destination: Home Follow up Recommendations: Home Health SLP;Outpatient SLP;24 hour supervision/assistance Equipment Recommended: To be determined    SLP Frequency 3 to 5 out of 7 days   SLP Duration  SLP Intensity  SLP Treatment/Interventions 7-9 days  Minumum of 1-2 x/day, 30 to 90 minutes  Cognitive remediation/compensation;Cueing hierarchy;Dysphagia/aspiration precaution training;Environmental controls;Multimodal communication approach;Internal/external aids;Functional tasks;Patient/family education;Speech/Language facilitation;Therapeutic Activities    Pain Pain Assessment Pain Assessment: No/denies pain  Prior Functioning Cognitive/Linguistic Baseline: Within functional limits Type of Home: House  Lives With: Family Available Help at Discharge: Family;Available 24 hours/day Vocation: Full time employment  Function:  Eating Eating   Modified Consistency Diet: Yes Eating Assist Level: Set up assist for;Supervision or verbal cues   Eating Set Up  Assist For: Opening containers       Cognition Comprehension Comprehension assist level: Understands basic 75 - 89% of the time/ requires cueing 10 - 24% of the time  Expression Expression assistive device: Other (Comment) (interpreter) Expression assist level: Expresses basic 25 - 49% of the time/requires cueing 50 - 75% of the time. Uses single words/gestures.  Social Interaction Social Interaction assist level: Interacts appropriately 50 - 74% of the time - May be physically or verbally inappropriate.  Problem Solving Problem solving assist level: Solves basic 75 - 89% of the time/requires cueing 10 - 24% of the time  Memory Memory assist level: Recognizes or recalls 90% of the time/requires cueing < 10% of the time   Short Term Goals: Week 1: SLP Short Term Goal 1 (Week 1): Patient will maintain selective attention for 30 minutes given supervision verbal cues in a minimally distracting environment.  SLP Short Term Goal 2 (Week 1): Patient will demonstrate basic, functional problem-solving given supervision verbal cues.  SLP Short Term Goal 3 (Week 1): Given Mod A multimodal phonemic, written, and visual cues, patient will communicate basic wants/needs at the phrase level.  SLP Short Term Goal 4 (Week 1): Patient will follow basic 2-step commands given Min A multimodal cues. SLP Short Term Goal 5 (Week 1): Patient will display no overt s/s of aspiration on current diet with Mod A verbal cues for use of swallowing compensatory strategies.  SLP Short Term Goal 6 (Week 1): Patient will consume trials of thin liquids with minimal overt s/s of aspiration across 2 sessions given Mod A verbal cues prior to follow-up MBS.   Refer to Care Plan for Long Term Goals  Recommendations for other services: None  Discharge Criteria: Patient will be discharged from SLP if patient refuses treatment 3 consecutive times without medical reason, if treatment goals not met, if there is a change in medical status,  if patient makes no progress towards goals or if patient is discharged from hospital.  The above assessment, treatment plan, treatment alternatives and goals were discussed and mutually agreed upon: by patient and by  family  Thornton Papas 12/16/2015, 4:51 PM

## 2015-12-17 ENCOUNTER — Inpatient Hospital Stay (HOSPITAL_COMMUNITY): Payer: Self-pay | Admitting: Occupational Therapy

## 2015-12-17 ENCOUNTER — Inpatient Hospital Stay (HOSPITAL_COMMUNITY): Payer: Self-pay | Admitting: Physical Therapy

## 2015-12-17 ENCOUNTER — Encounter (HOSPITAL_COMMUNITY): Payer: Self-pay | Admitting: Cardiology

## 2015-12-17 ENCOUNTER — Inpatient Hospital Stay (HOSPITAL_COMMUNITY): Payer: Self-pay | Admitting: Speech Pathology

## 2015-12-17 NOTE — Progress Notes (Signed)
Subjective/Complaints: No issues overnite, wife assisting pt with breakfast , intake is good ROS-  No CP, SOB N/V/D Objective: Vital Signs: Blood pressure (!) 99/51, pulse 77, temperature 98 F (36.7 C), temperature source Oral, resp. rate 18, height 5' 7" (1.702 m), weight 72.8 kg (160 lb 7.9 oz), SpO2 96 %. No results found. Results for orders placed or performed during the hospital encounter of 12/15/15 (from the past 72 hour(s))  CBC WITH DIFFERENTIAL     Status: Abnormal   Collection Time: 12/16/15  7:25 AM  Result Value Ref Range   WBC 11.7 (H) 4.0 - 10.5 K/uL   RBC 4.84 4.22 - 5.81 MIL/uL   Hemoglobin 14.4 13.0 - 17.0 g/dL   HCT 42.5 39.0 - 52.0 %   MCV 87.8 78.0 - 100.0 fL   MCH 29.8 26.0 - 34.0 pg   MCHC 33.9 30.0 - 36.0 g/dL   RDW 12.6 11.5 - 15.5 %   Platelets 358 150 - 400 K/uL   Neutrophils Relative % 67 %   Neutro Abs 7.8 (H) 1.7 - 7.7 K/uL   Lymphocytes Relative 24 %   Lymphs Abs 2.9 0.7 - 4.0 K/uL   Monocytes Relative 7 %   Monocytes Absolute 0.8 0.1 - 1.0 K/uL   Eosinophils Relative 2 %   Eosinophils Absolute 0.2 0.0 - 0.7 K/uL   Basophils Relative 0 %   Basophils Absolute 0.1 0.0 - 0.1 K/uL  Comprehensive metabolic panel     Status: None   Collection Time: 12/16/15  7:25 AM  Result Value Ref Range   Sodium 139 135 - 145 mmol/L   Potassium 4.0 3.5 - 5.1 mmol/L   Chloride 108 101 - 111 mmol/L   CO2 23 22 - 32 mmol/L   Glucose, Bld 95 65 - 99 mg/dL   BUN 9 6 - 20 mg/dL   Creatinine, Ser 0.66 0.61 - 1.24 mg/dL   Calcium 9.1 8.9 - 10.3 mg/dL   Total Protein 6.5 6.5 - 8.1 g/dL   Albumin 3.5 3.5 - 5.0 g/dL   AST 24 15 - 41 U/L   ALT 39 17 - 63 U/L   Alkaline Phosphatase 54 38 - 126 U/L   Total Bilirubin 0.6 0.3 - 1.2 mg/dL   GFR calc non Af Amer >60 >60 mL/min   GFR calc Af Amer >60 >60 mL/min    Comment: (NOTE) The eGFR has been calculated using the CKD EPI equation. This calculation has not been validated in all clinical situations. eGFR's  persistently <60 mL/min signify possible Chronic Kidney Disease.    Anion gap 8 5 - 15      General: No acute distress Mood and affect are appropriate Heart: Regular rate and rhythm no rubs murmurs or extra sounds Lungs: Clear to auscultation, breathing unlabored, no rales or wheezes Abdomen: Positive bowel sounds, soft nontender to palpation, nondistended Extremities: No clubbing, cyanosis, or edema Skin: No evidence of breakdown, no evidence of rash Neurologic: Cranial nerves II through XII intact, motor strength is 5/5 in Left  deltoid, bicep, tricep, grip, hip flexor, knee extensors, ankle dorsiflexor and plantar flexor 3+/5 on RIght side Sensory exam normal sensation to light touch and proprioception in bilateral upper and lower extremities Names 4/6 simple objects Cerebellar exam reduced finger to nose to finger on RIght     Musculoskeletal: Full range of motion in all 4 extremities. No joint swelling   Assessment/Plan: 1. Functional deficits secondary to Left MCA infarct which require 3+  hours per day of interdisciplinary therapy in a comprehensive inpatient rehab setting. Physiatrist is providing close team supervision and 24 hour management of active medical problems listed below. Physiatrist and rehab team continue to assess barriers to discharge/monitor patient progress toward functional and medical goals. FIM: Function - Bathing Position: Shower Body parts bathed by patient: Right arm, Left arm, Chest, Abdomen, Front perineal area, Buttocks, Right upper leg, Left upper leg, Right lower leg, Left lower leg Assist Level: Touching or steadying assistance(Pt > 75%)  Function- Upper Body Dressing/Undressing What is the patient wearing?: Hospital gown Assist Level: Touching or steadying assistance(Pt > 75%) Function - Lower Body Dressing/Undressing What is the patient wearing?: Socks Position: Wheelchair/chair at sink Socks - Performed by helper: Don/doff right sock,  Don/doff left sock Assist for footwear: Partial/moderate assist Assist for lower body dressing: Touching or steadying assistance (Pt > 75%)  Function - Toileting Toileting activity did not occur: No continent bowel/bladder event  Function - Air cabin crew transfer assistive device: Grab bar Assist level to toilet: Touching or steadying assistance (Pt > 75%) Assist level from toilet: Touching or steadying assistance (Pt > 75%)  Function - Chair/bed transfer Chair/bed transfer method: Stand pivot, Ambulatory Chair/bed transfer assist level: Touching or steadying assistance (Pt > 75%) Chair/bed transfer assistive device: Armrests  Function - Locomotion: Wheelchair Will patient use wheelchair at discharge?: No Wheelchair activity did not occur: N/A (pt ambulatory on unit) Function - Locomotion: Ambulation Assistive device: No device Max distance: 170 Assist level: Touching or steadying assistance (Pt > 75%) Assist level: Touching or steadying assistance (Pt > 75%) Assist level: Touching or steadying assistance (Pt > 75%) Assist level: Touching or steadying assistance (Pt > 75%) Assist level: Touching or steadying assistance (Pt > 75%)  Function - Comprehension Comprehension: Auditory Comprehension assist level: Understands basic 75 - 89% of the time/ requires cueing 10 - 24% of the time  Function - Expression Expression: Verbal Expression assistive device: Other (Comment) (interpreter) Expression assist level: Expresses basic 25 - 49% of the time/requires cueing 50 - 75% of the time. Uses single words/gestures.  Function - Social Interaction Social Interaction assist level: Interacts appropriately 50 - 74% of the time - May be physically or verbally inappropriate.  Function - Problem Solving Problem solving assist level: Solves basic 75 - 89% of the time/requires cueing 10 - 24% of the time  Function - Memory Memory assist level: Recognizes or recalls 90% of the  time/requires cueing < 10% of the time Patient normally able to recall (first 3 days only): That he or she is in a hospital, Location of own room, Current season, Staff names and faces    Medical Problem List and Plan: 1.  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 Status post loop recorder             -CIR PT, OT, SLP eval 2.  DVT Prophylaxis/Anticoagulation: Subcutaneous Lovenox. Monitor platelet counts for any signs of bleeding 3. Pain Management: Tylenol as needed 4. Aphasia/dysphagia.Dysphagia #1 nectar thick liquids. Follow-up speech therapy             -encourage adequate po intake given diet, ask dietary to eval, needs assist with feeding, plate guard but does use R hand 5. Neuropsych: This patient is not capable of making decisions on his own behalf. 6. Skin/Wound Care: Routine skin checks 7. Fluids/Electrolytes/Nutrition: Routine I&O with follow-up  chemistries upon admission tomorrow, labs ok 8.Tobacco abuse.Counseling  LOS (Days) 2 A FACE TO FACE EVALUATION WAS PERFORMED  KIRSTEINS,ANDREW E 12/17/2015, 7:54 AM

## 2015-12-17 NOTE — Progress Notes (Signed)
Social Work Assessment and Plan Social Work Assessment and Plan  Patient Details  Name: Rickard Wickers MRN: [id number] Date of Birth: [birth year]  Today's Date: 12/17/2015  Problem List:  Patient Active Problem List   Diagnosis Date Noted  . Cerebral infarction due to thrombosis of left middle cerebral artery ([place]) 12/15/2015  . Left middle cerebral artery stroke ([place]) 12/15/2015  . Oropharyngeal dysphagia   . Aphasia as late effect of stroke   . Right hemiplegia ([place])   . Acute on chronic respiratory failure ([place])   . Essential hypertension   . CVA (cerebral vascular accident) ([place]) 12/08/2015   Past Medical History:  Past Medical History:  Diagnosis Date  . Skin cancer    Past Surgical History:  Past Surgical History:  Procedure Laterality Date  . EP IMPLANTABLE DEVICE N/A 12/15/2015   Procedure: Loop Recorder Insertion;  Surgeon: Will Meredith Leeds, MD;  Location: [place] [facility];  Service: Cardiovascular;  Laterality: N/A;  . IR GENERIC HISTORICAL  12/08/2015   IR PERCUTANEOUS ART THROMBECTOMY/INFUSION INTRACRANIAL INC DIAG ANGIO 12/08/2015 Luanne Bras, MD [facility]  . RADIOLOGY WITH ANESTHESIA Right 12/08/2015   Procedure: RADIOLOGY WITH ANESTHESIA - CODE STROKE;  Surgeon: Medication Radiologist, MD;  Location: [place];  Service: Radiology;  Laterality: Right;  . SKIN SURGERY    . TEE WITHOUT CARDIOVERSION N/A 12/15/2015   Procedure: TRANSESOPHAGEAL ECHOCARDIOGRAM (TEE);  Surgeon: Sueanne Margarita, MD;  Location: [facility];  Service: Cardiovascular;  Laterality: N/A;   Social History:  reports that he has been smoking Cigarettes.  He has been smoking about 1.00 pack per day. He has never used smokeless tobacco. He reports that he drinks alcohol. He reports that he does not use drugs.  Family / Support Systems Marital Status: Single Patient Roles: Production manager, Parent, Other (Comment) (employee) Spouse/Significant Other: Monica-girlfriend [phone] Other Supports:  cousin Anticipated Caregiver: Brayton Layman and family members Ability/Limitations of Caregiver: Brayton Layman works but plans to make arrangements that someone is with pt at discharge Caregiver Availability: 24/7 Family Dynamics: Close knit family and extended fmaily they will make sure someone is with pt at discharge. Brayton Layman is changing her work schedule and other family members are willing to flex their schedules also.  Social History Preferred language: English Religion: Catholic Cultural Background: Pt main language is Spanish and does speak limited English Education: Some schooling-trade school Read: Yes (spanish) Write: Yes (spanish) Employment Status: Employed Name of Employer: self employed Horticulturist, commercial Return to Work Plans: Would like to return to work if able Freight forwarder Issues: No issues Guardian/Conservator: None-accroding to MD pt is not capable at this time to make his own decisions, will look toward his girlfreind since she is his next of kin. She is here often also.   Abuse/Neglect Physical Abuse: Denies Verbal Abuse: Denies Sexual Abuse: Denies Exploitation of patient/patient's resources: Denies Self-Neglect: Denies  Emotional Status Pt's affect, behavior adn adjustment status: Pt is motivated and wants to recover from this stroke. He tires to take care of himself and didn;t realize this could happen at his age. Brayton Layman is supportive and reports he is stubborn and will get independent again. He doesn't like depending upon others. Recent Psychosocial Issues: thought he was healthy although knows needs to quit smoking Pyschiatric History: No issues-deferre depression screen due to pt coping appropriately and doing the program, will discuss with team if needs neuro-psych to see while here. Substance Abuse History: Tobacco plans to quit smoking now. Will make aware of the resources available  to him  Patient / Family Perceptions, Expectations & Goals Pt/Family  understanding of illness & functional limitations: Pt and Monica have a good understanding of his stroke and deficits. They do talk with the staff and MD to ask questions. Pt is progressing in therapies and pleased with this. Premorbid pt/family roles/activities: boyfriend, father, employee, cousin, uncle, friend, etc Anticipated changes in roles/activities/participation: resume Pt/family expectations/goals: Pt states: " I want to get back to where I was and work againYahoo states: " I hope he continues to do well he has done well here."  [employer]: None Premorbid Home Care/DME Agencies: None Transportation available at discharge: [employer] referrals recommended: Support group (specify)  Discharge Planning Living Arrangements: Other relatives Support Systems: Spouse/significant other, Children, Other relatives, Water engineer, Social worker community Type of Residence: Private residence [employer]: Government social research officer (will apply for [employer]) Financial Resources: Employment, Secondary school teacher Screen Referred: Yes Living Expenses: Education officer, community Management: Patient Does the patient have any problems obtaining your medications?: Yes (Describe) (is uninsured) Home Management: Pt and Patent attorney Preliminary Plans: Go to [employer] home with their kids, some family member will be there with him for almost 24 hr when first comes home. Brayton Layman has been here and observed him in therapies and can see the progress he is making. Will work with on obtaining PCP and applying for medicaid and disability Social Work Anticipated Follow Up Needs: HH/OP, Support Group  Clinical Impression Pleasant gentleman who is working hard in therapies and girlfriend-Monica here and being supportive. Family is willing to do what they can so pt can be successful here and at home. Will make sure has applied for disability and Medicaid while here. Will also work on  obtaining PCP for follow up. Will arrange interpreter for therapies while here and if RN or MD needs will include. Information obtained with interpreter present with pt and girlfriend, so accurate.  Elease Hashimoto 12/17/2015, 1:07 PM

## 2015-12-17 NOTE — Progress Notes (Signed)
Speech Language Pathology Daily Session Note  Patient Details  Name: James Cortez MRN: [id number] Date of Birth: 06-21-1965  Today's Date: 12/17/2015 SLP Individual Time: 1105-1200 SLP Individual Time Calculation (min): 55 min   Short Term Goals: Week 1: SLP Short Term Goal 1 (Week 1): Patient will maintain selective attention for 30 minutes given supervision verbal cues in a minimally distracting environment.  SLP Short Term Goal 2 (Week 1): Patient will demonstrate basic, functional problem-solving given supervision verbal cues.  SLP Short Term Goal 3 (Week 1): Given Mod A multimodal phonemic, written, and visual cues, patient will communicate basic wants/needs at the phrase level.  SLP Short Term Goal 4 (Week 1): Patient will follow basic 2-step commands given Min A multimodal cues. SLP Short Term Goal 5 (Week 1): Patient will display no overt s/s of aspiration on current diet with Mod A verbal cues for use of swallowing compensatory strategies.  SLP Short Term Goal 6 (Week 1): Patient will consume trials of thin liquids with minimal overt s/s of aspiration across 2 sessions given Mod A verbal cues prior to follow-up MBS.   Skilled Therapeutic Interventions: Skilled therapy intervention focused on language goals. When asked about recent events such as his sleep last night and breakfast in the morning, patient was able to independently verbalize answers at the word level, but required Mod A verbal cues from family member and interpreter to verbalize in phrases. Assessment of expressive language complicated by language barrier between patient and clinician as well as dialect barrier between patient and interpreter, however patient was administered the bedside record form of the [place] Revised to assist with establishing current cognitive-linguistic status. Patient exhibited deficits in the following domains: word-finding, following sequential commands, and verbal repetition at the  phrase level. Patient did not exhibit significant deficits in the following domains: auditory comprehension in response to yes/no questions, object naming, and non-verbal motor planning. Patient left upright in bed with family and translator in room and all needs within reach. Continue current plan of care.   Function:  Cognition Comprehension Comprehension assist level: Understands basic 75 - 89% of the time/ requires cueing 10 - 24% of the time  Expression   Expression assist level: Expresses basic 25 - 49% of the time/requires cueing 50 - 75% of the time. Uses single words/gestures.  Social Interaction Social Interaction assist level: Interacts appropriately 50 - 74% of the time - May be physically or verbally inappropriate.  Problem Solving Problem solving assist level: Solves basic 75 - 89% of the time/requires cueing 10 - 24% of the time  Memory Memory assist level: Recognizes or recalls 90% of the time/requires cueing < 10% of the time    Pain Pain Assessment Pain Assessment: No/denies pain  Therapy/Group: Individual Therapy  Thornton Papas 12/17/2015, 12:58 PM

## 2015-12-17 NOTE — Care Management Note (Signed)
Inpatient Rehabilitation Center Individual Statement of Services  Patient Name:  James Cortez  Date:  12/17/2015  Welcome to the [facility].  Our goal is to provide you with an individualized program based on your diagnosis and situation, designed to meet your specific needs.  With this comprehensive rehabilitation program, you will be expected to participate in at least 3 hours of rehabilitation therapies Monday-Friday, with modified therapy programming on the weekends.  Your rehabilitation program will include the following services:  Physical Therapy (PT), Occupational Therapy (OT), Speech Therapy (ST), 24 hour per day rehabilitation nursing, Therapeutic Recreaction (TR), Case Management (Social Worker), Rehabilitation Medicine, Nutrition Services and Pharmacy Services  Weekly team conferences will be held on Wednesday to discuss your progress.  Your Social Worker will talk with you frequently to get your input and to update you on team discussions.  Team conferences with you and your family in attendance may also be held.  Expected length of stay: 7-9 days  Overall anticipated outcome: mod/i level  Depending on your progress and recovery, your program may change. Your Social Worker will coordinate services and will keep you informed of any changes. Your Social Worker's name and contact numbers are listed  below.  The following services may also be recommended but are not provided by the [place] will be made to provide these services after discharge if needed.  Arrangements include referral to agencies that provide these services.  Your insurance has been verified to be:  Self pay-applying for medicaid Your primary doctor is:  None  Pertinent information will be shared with your doctor and your insurance  company.  Social Worker:  Ovidio Kin, [place] or ([phone]  Information discussed with and copy given to patient by: Elease Hashimoto, 12/17/2015, 1:10 PM

## 2015-12-17 NOTE — Telephone Encounter (Signed)
opened in error

## 2015-12-17 NOTE — Progress Notes (Signed)
Physical Therapy Session Note  Patient Details  Name: James Cortez MRN: [id number] Date of Birth: Jun 30, 1965  Today's Date: 12/17/2015 PT Individual Time: 1300-1410 PT Individual Time Calculation (min): 70 min    Short Term Goals: Week 1:  PT Short Term Goal 1 (Week 1): =LTGs due to ELOS  Skilled Therapeutic Interventions/Progress Updates:    Pt resting in bed on arrival, no c/o pain, and agreeable to therapy session.  Session focus on activity tolerance, cognition, and NMR.    Gait training throughout unit, max distance 300', focus on attention to obstacles within R visual field with min multimodal cues, dual task while throwing/catching a ball, and path finding.  Pt able to locate therapy gym from multiple locations on unit, and locate his room from an unfamiliar part of the unit.    NMR in tall kneeling and half kneeling while completing cognitive puzzles (pipe tree and colored peg board).  Pt requires min>mod multimodal cues for sequencing, organization, and correction of errors.    Dynavision x3 trials in mode A with pt scoring 18>34>34 hits and reaction time improving from 3.33 seconds to 1.76 seconds.    Pt able to find his room from [facility] gym without cues.  Positioned in bed with call bell in reach and needs met.   Therapy Documentation Precautions:  Precautions Precautions: Fall Precaution Comments: Rt inattention  Restrictions Weight Bearing Restrictions: No   See Function Navigator for Current Functional Status.   Therapy/Group: Individual Therapy  Earnest Conroy Penven-Crew 12/17/2015, 2:42 PM

## 2015-12-17 NOTE — Progress Notes (Signed)
Occupational Therapy Session Note  Patient Details  Name: James Cortez MRN: [id number] Date of Birth: [birth year]  Today's Date: 12/17/2015 OT Individual Time: 0900-1000 OT Individual Time Calculation (min): 60 min     Short Term Goals: Week 1:  OT Short Term Goal 1 (Week 1): LTG= STG 2/2 estimated short LOS  Skilled Therapeutic Interventions/Progress Updates:   1:1 self care retraining at shower level with focus on dynamic standing balance, functional problem solving, following one to two step commands with supervision to max A, functional use of right  UE, activity tolerance, etc. Pt required mod cuing for right Ue use in functional tasks; if pt can accomplish a task one handed he would and neglect use of right UE. Pt required max cuing for intellectual awareness of decr attention to right environment. Pt required total A to attend to the right when in the hallway and to navigate from room to the gym.  NMR with focus on heavy weight bearing and forced functional use of right hand. Pt able to come down to the floor with steadying A and get into quadruped. Focus on static positioning in quadruped and then performing functional reach alternating UEs. Pt required max A for 2 step commands and min cuing for one step commands. Pt also performed core strengthening and coordination with lifting alternating UE and LE and then switching sides. Return to room and left in bed with bed alarm.   Therapy Documentation Precautions:  Precautions Precautions: Fall Precaution Comments: Rt inattention  Restrictions Weight Bearing Restrictions: No  Pain: no c/o  See Function Navigator for Current Functional Status.   Therapy/Group: Individual Therapy  Willeen Cass [facility] 12/17/2015, 12:08 PM

## 2015-12-18 ENCOUNTER — Inpatient Hospital Stay (HOSPITAL_COMMUNITY): Payer: Self-pay | Admitting: Occupational Therapy

## 2015-12-18 ENCOUNTER — Inpatient Hospital Stay (HOSPITAL_COMMUNITY): Payer: Self-pay | Admitting: Physical Therapy

## 2015-12-18 ENCOUNTER — Inpatient Hospital Stay (HOSPITAL_COMMUNITY): Payer: Self-pay | Admitting: Speech Pathology

## 2015-12-18 NOTE — Progress Notes (Signed)
Subjective/Complaints: No issues overnite, eating well ROS-  No CP, SOB N/V/D Objective: Vital Signs: Blood pressure 100/63, pulse 60, temperature 98 F (36.7 C), temperature source Oral, resp. rate 17, height '5\' 7"'$  (1.702 m), weight 72.8 kg (160 lb 7.9 oz), SpO2 96 %. No results found. Results for orders placed or performed during the hospital encounter of 12/15/15 (from the past 72 hour(s))  CBC WITH DIFFERENTIAL     Status: Abnormal   Collection Time: 12/16/15  7:25 AM  Result Value Ref Range   WBC 11.7 (H) 4.0 - 10.5 K/uL   RBC 4.84 4.22 - 5.81 MIL/uL   Hemoglobin 14.4 13.0 - 17.0 g/dL   HCT 42.5 39.0 - 52.0 %   MCV 87.8 78.0 - 100.0 fL   MCH 29.8 26.0 - 34.0 pg   MCHC 33.9 30.0 - 36.0 g/dL   RDW 12.6 11.5 - 15.5 %   Platelets 358 150 - 400 K/uL   Neutrophils Relative % 67 %   Neutro Abs 7.8 (H) 1.7 - 7.7 K/uL   Lymphocytes Relative 24 %   Lymphs Abs 2.9 0.7 - 4.0 K/uL   Monocytes Relative 7 %   Monocytes Absolute 0.8 0.1 - 1.0 K/uL   Eosinophils Relative 2 %   Eosinophils Absolute 0.2 0.0 - 0.7 K/uL   Basophils Relative 0 %   Basophils Absolute 0.1 0.0 - 0.1 K/uL  Comprehensive metabolic panel     Status: None   Collection Time: 12/16/15  7:25 AM  Result Value Ref Range   Sodium 139 135 - 145 mmol/L   Potassium 4.0 3.5 - 5.1 mmol/L   Chloride 108 101 - 111 mmol/L   CO2 23 22 - 32 mmol/L   Glucose, Bld 95 65 - 99 mg/dL   BUN 9 6 - 20 mg/dL   Creatinine, Ser 0.66 0.61 - 1.24 mg/dL   Calcium 9.1 8.9 - 10.3 mg/dL   Total Protein 6.5 6.5 - 8.1 g/dL   Albumin 3.5 3.5 - 5.0 g/dL   AST 24 15 - 41 U/L   ALT 39 17 - 63 U/L   Alkaline Phosphatase 54 38 - 126 U/L   Total Bilirubin 0.6 0.3 - 1.2 mg/dL   GFR calc non Af Amer >60 >60 mL/min   GFR calc Af Amer >60 >60 mL/min    Comment: (NOTE) The eGFR has been calculated using the CKD EPI equation. This calculation has not been validated in all clinical situations. eGFR's persistently <60 mL/min signify possible Chronic  Kidney Disease.    Anion gap 8 5 - 15      General: No acute distress Mood and affect are appropriate Heart: Regular rate and rhythm no rubs murmurs or extra sounds Lungs: Clear to auscultation, breathing unlabored, no rales or wheezes Abdomen: Positive bowel sounds, soft nontender to palpation, nondistended Extremities: No clubbing, cyanosis, or edema, IV right forearm Skin: No evidence of breakdown, no evidence of rash Neurologic: Cranial nerves II through XII intact, motor strength is 5/5 in Left  deltoid, bicep, tricep, grip, hip flexor, knee extensors, ankle dorsiflexor and plantar flexor 3+/5 on RIght side Sensory exam normal sensation to light touch and proprioception in bilateral upper and lower extremities  Cerebellar exam reduced finger to nose to finger on RIght     Musculoskeletal: Full range of motion in all 4 extremities. No joint swelling   Assessment/Plan: 1. Functional deficits secondary to Left MCA infarct which require 3+ hours per day of interdisciplinary therapy in a  comprehensive inpatient rehab setting. Physiatrist is providing close team supervision and 24 hour management of active medical problems listed below. Physiatrist and rehab team continue to assess barriers to discharge/monitor patient progress toward functional and medical goals. FIM: Function - Bathing Position: Shower Body parts bathed by patient: Right arm, Left arm, Chest, Abdomen, Front perineal area, Buttocks, Right upper leg, Left upper leg, Right lower leg, Left lower leg Assist Level: Touching or steadying assistance(Pt > 75%)  Function- Upper Body Dressing/Undressing What is the patient wearing?: Pull over shirt/dress Pull over shirt/dress - Perfomed by patient: Thread/unthread right sleeve, Thread/unthread left sleeve, Put head through opening, Pull shirt over trunk Assist Level: Supervision or verbal cues, Touching or steadying assistance(Pt > 75%) (in standing) Function - Lower Body  Dressing/Undressing What is the patient wearing?: Underwear, Pants, Non-skid slipper socks Position: Wheelchair/chair at sink Underwear - Performed by patient: Thread/unthread right underwear leg, Thread/unthread left underwear leg, Pull underwear up/down Pants- Performed by patient: Thread/unthread right pants leg, Thread/unthread left pants leg, Pull pants up/down Non-skid slipper socks- Performed by patient: Don/doff right sock, Don/doff left sock Socks - Performed by helper: Don/doff right sock, Don/doff left sock Assist for footwear: Partial/moderate assist Assist for lower body dressing: Touching or steadying assistance (Pt > 75%)  Function - Toileting Toileting activity did not occur: No continent bowel/bladder event Toileting steps completed by patient: Adjust clothing prior to toileting, Performs perineal hygiene, Adjust clothing after toileting Assist level: Touching or steadying assistance (Pt.75%)  Function - Air cabin crew transfer assistive device: Grab bar Assist level to toilet: Touching or steadying assistance (Pt > 75%) Assist level from toilet: Touching or steadying assistance (Pt > 75%)  Function - Chair/bed transfer Chair/bed transfer method: Ambulatory Chair/bed transfer assist level: Supervision or verbal cues Chair/bed transfer assistive device: Armrests Chair/bed transfer details: Verbal cues for precautions/safety (cues for attention to R side)  Function - Locomotion: Wheelchair Will patient use wheelchair at discharge?: No Wheelchair activity did not occur: N/A (pt ambulatory on unit) Function - Locomotion: Ambulation Assistive device: No device Max distance: 300 Assist level: Supervision or verbal cues Assist level: Supervision or verbal cues Assist level: Supervision or verbal cues Assist level: Supervision or verbal cues Assist level: Supervision or verbal cues  Function - Comprehension Comprehension: Auditory Comprehension assist level:  Understands basic 75 - 89% of the time/ requires cueing 10 - 24% of the time  Function - Expression Expression: Verbal Expression assistive device: Other (Comment) (interpreter) Expression assist level: Expresses basic 25 - 49% of the time/requires cueing 50 - 75% of the time. Uses single words/gestures.  Function - Social Interaction Social Interaction assist level: Interacts appropriately 50 - 74% of the time - May be physically or verbally inappropriate.  Function - Problem Solving Problem solving assist level: Solves basic 75 - 89% of the time/requires cueing 10 - 24% of the time  Function - Memory Memory assist level: Recognizes or recalls 90% of the time/requires cueing < 10% of the time Patient normally able to recall (first 3 days only): That he or she is in a hospital, Location of own room, Current season, Staff names and faces    Medical Problem List and Plan: 1.  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 Status post loop recorder             -CIR PT, OT, SLP cont program 2.  DVT  Prophylaxis/Anticoagulation: Subcutaneous Lovenox. Monitor platelet counts for any signs of bleeding 3. Pain Management: Tylenol as needed 4. Aphasia/dysphagia.Dysphagia #1 nectar thick liquids. Follow-up speech therapy             -encourage adequate po intake given diet, ask dietary to eval, needs assist with feeding, plate guard but does use R hand 5. Neuropsych: This patient is not capable of making decisions on his own behalf. 6. Skin/Wound Care: Routine skin checks 7. Fluids/Electrolytes/Nutrition: Routine I&O with follow-up chemistries upon admission tomorrow, labs ok 8.Tobacco abuse.Counseling May d/c IV LOS (Days) 3 A FACE TO FACE EVALUATION WAS PERFORMED  Lainee Lehrman E 12/18/2015, 8:06 AM

## 2015-12-18 NOTE — IPOC Note (Signed)
Overall Plan of Care [facility]) Patient Details Name: James Cortez MRN: [id number] DOB: [birth year]  Admitting Diagnosis: Stroke  Hospital Problems: Principal Problem:   Cerebral infarction due to thrombosis of left middle cerebral artery ([place]) Active Problems:   Right hemiplegia ([place])   Essential hypertension   Oropharyngeal dysphagia   Aphasia as late effect of stroke   Left middle cerebral artery stroke ([place])     Functional Problem List: Nursing Safety, Endurance, Nutrition, Skin Integrity  PT Balance, Endurance, Perception, Motor, Safety  OT Balance, Cognition, Endurance, Motor, Safety  SLP Cognition, Linguistic, Motor, Safety  TR         Basic ADL's: OT Grooming, Bathing, Dressing, Toileting, Eating     Advanced  ADL's: OT       Transfers: PT Bed Mobility, Bed to Chair, Car, Furniture, Futures trader, Tub/Shower     Locomotion: PT Stairs, Ambulation     Additional Impairments: OT Fuctional Use of Upper Extremity  SLP Swallowing, Communication, Social Cognition comprehension, expression Attention, Awareness, Problem Solving  TR      Anticipated Outcomes Item Anticipated Outcome  Self Feeding Mod I  Swallowing  Mod A with least restrictive diet   Basic self-care  Mod I  Toileting  Mod I   Bathroom Transfers Mod I  Bowel/Bladder  Mod 1  Transfers  mod I  Locomotion  mod I home, supervision community, ambulatory  Communication  Mod A for functional communication  Cognition  Min A   Pain  <3  Safety/Judgment  Mod 1   Therapy Plan: PT Intensity: Minimum of 1-2 x/day ,45 to 90 minutes PT Frequency: 5 out of 7 days PT Duration Estimated Length of Stay: 7-9 days OT Intensity: Minimum of 1-2 x/day, 45 to 90 minutes OT Frequency: 5 out of 7 days OT Duration/Estimated Length of Stay: 7 days SLP Intensity: Minumum of 1-2 x/day, 30 to 90 minutes SLP Frequency: 3 to 5 out of 7 days SLP Duration/Estimated Length of Stay: 7-9 days       Team  Interventions: Nursing Interventions Patient/Family Education, Medication Management, Disease Management/Prevention, Psychosocial Support, Discharge Planning, Skin Care/Wound Management  PT interventions Ambulation/gait training, Balance/vestibular training, Cognitive remediation/compensation, Discharge planning, Community reintegration, DME/adaptive equipment instruction, Functional electrical stimulation, Functional mobility training, Patient/family education, Neuromuscular re-education, Psychosocial support, Splinting/orthotics, Therapeutic Exercise, Therapeutic Activities, Stair training, UE/LE Strength taining/ROM, UE/LE Coordination activities, Visual/perceptual remediation/compensation  OT Interventions Training and development officer, Cognitive remediation/compensation, Community reintegration, Discharge planning, DME/adaptive equipment instruction, Functional mobility training, Neuromuscular re-education, Patient/family education, Psychosocial support, Self Care/advanced ADL retraining, Therapeutic Activities, Therapeutic Exercise, UE/LE Strength taining/ROM, UE/LE Coordination activities, Visual/perceptual remediation/compensation  SLP Interventions Cognitive remediation/compensation, Cueing hierarchy, Dysphagia/aspiration precaution training, Environmental controls, Multimodal communication approach, Internal/external aids, Functional tasks, Patient/family education, Speech/Language facilitation, Therapeutic Activities  TR Interventions    SW/CM Interventions Discharge Planning, Psychosocial Support, Patient/Family Education    Team Discharge Planning: Destination: PT-Home ,OT- Home , SLP-Home Projected Follow-up: PT-Outpatient PT, Other (comment) (intermittent supervision), OT-  Home health OT, SLP-Home Health SLP, Outpatient SLP, 24 hour supervision/assistance Projected Equipment Needs: PT-None recommended by PT, OT- To be determined, SLP-To be determined Equipment Details: PT- , OT-   Patient/family involved in discharge planning: PT- Patient,  OT-Patient, SLP-Patient, Family member/caregiver  MD ELOS: 10-14d Medical Rehab Prognosis:  Excellent Assessment: 50 y.o.Spanish limited English-speakingright handed malewith history of tobacco abuse. Per chart review patient lives with cousin. Independent prior to admission. Works in [employer].He plans to stay with family and assistance as needed.Present 12/08/2015 with right-sided  weakness facial droop and aphasia. Cranial CT scan negative. Patient did receive TPA. CT angiogram showed acute occlusion of the M1 segment of the left middle cerebral artery. 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 No associated hemorrhage. Underwent revascularization with mechanical thrombectomy and MCA stent per interventional radiology. Patient self extubated after procedure. Echocardiogram With ejection fraction of 55-60% no wall motion abnormalities. Currently on Plavix for CVA prophylaxis. TEE Showed normal LV size and function and underwent loop recorder placement 12/15/2015. 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 No evidence of deep vein thrombosis involving bilateral lower extremities. Subcutaneous Lovenox for DVT prophylaxis. Presently on a dysphagia #1 nectar thick liquid    Now requiring 24/7 Rehab RN,MD, as well as CIR level PT, OT and SLP.  Treatment team will focus on ADLs and mobility with goals set at modI  See Team Conference Notes for weekly updates to the plan of care

## 2015-12-18 NOTE — Progress Notes (Signed)
Occupational Therapy Session Note  Patient Details  Name: James Cortez MRN: [id number] Date of Birth: 1965/03/17  Today's Date: 12/18/2015 OT Individual Time: 0900-1000 OT Individual Time Calculation (min): 60 min     Short Term Goals: Week 1:  OT Short Term Goal 1 (Week 1): LTG= STG 2/2 estimated short LOS Week 2:    Week 3:     Skilled Therapeutic Interventions/Progress Updates:     Addressed functional mobiltiy, ADL at shower level, balance in sitting and standing, transfers, RUE NMRE>  Ppt ambulated with no assistive deivice to shower + SBA.  Transferred to bench with SBA.  Used R and L UE for bilateral tasks with donning socks, pants, shirt.  Ambulated to gym and performed forced use and weight bearing on mat in quadruped position for reaching with LUE.  Ambulated back to room.  Transferred to bed for rest.  .     Therapy Documentation Precautions:  Precautions Precautions: Fall Precaution Comments: Rt inattention  Restrictions Weight Bearing Restrictions: No General:   Vital Signs: Therapy Vitals Temp: 97.9 F (36.6 C) Temp Source: Oral Pulse Rate: 63 Resp: 20 BP: 109/62 Patient Position (if appropriate): Lying Oxygen Therapy SpO2: 98 % O2 Device: Not Delivered Pain:  none   ADL: ADL ADL Comments: Please see functional navigator Exercises:   Other Treatments:    See Function Navigator for Current Functional Status.   Therapy/Group: Individual Therapy  Lisa Roca 12/18/2015, 3:49 PM

## 2015-12-18 NOTE — Progress Notes (Signed)
Speech Language Pathology Daily Session Note  Patient Details  Name: James Cortez MRN: [id number] Date of Birth: May 25, 1965  Today's Date: 12/18/2015 SLP Individual Time: 1110-1205 SLP Individual Time Calculation (min): 55 min   Short Term Goals: Week 1: SLP Short Term Goal 1 (Week 1): Patient will maintain selective attention for 30 minutes given supervision verbal cues in a minimally distracting environment.  SLP Short Term Goal 2 (Week 1): Patient will demonstrate basic, functional problem-solving given supervision verbal cues.  SLP Short Term Goal 3 (Week 1): Given Mod A multimodal phonemic, written, and visual cues, patient will communicate basic wants/needs at the phrase level.  SLP Short Term Goal 4 (Week 1): Patient will follow basic 2-step commands given Min A multimodal cues. SLP Short Term Goal 5 (Week 1): Patient will display no overt s/s of aspiration on current diet with Mod A verbal cues for use of swallowing compensatory strategies.  SLP Short Term Goal 6 (Week 1): Patient will consume trials of thin liquids with minimal overt s/s of aspiration across 2 sessions given Mod A verbal cues prior to follow-up MBS.   Skilled Therapeutic Interventions: Pt was seen for skilled ST targeting goals for communication and dysphagia.  SLP facilitated the session with trials of water following oral care to continue working towards repeat MBS.  Pt demonstrated delayed coughing following trials of thin liquids which appears improved from most recent objective study as aspiration was primarily silent in nature.  Pt's vocal quality was noted to be slightly wet throughout therapy session which did not worsen significantly with PO intake.  Question if pt has mildly decreased management of secretions in the setting of oral motor weakness which could be contributing to vocal quality as pt was also noted with trace pooling of saliva at the right corner of his lips.  Pt was able to verbalize at the phrase  level during structured picture description tasks with mod assist verbal and visual cues to recognize and correct verbal errors.  Pt needed min-mod assist verbal cues for word finding during task as well.  Pt was left in straightback chair at the end of today's therapy session with quick release belt donned for safety.  Family at bedside.  Continue per current plan of care.    Function:  Eating Eating   Modified Consistency Diet: Yes Eating Assist Level: Supervision or verbal cues           Cognition Comprehension Comprehension assist level: Understands basic 75 - 89% of the time/ requires cueing 10 - 24% of the time  Expression Expression assistive device: Other (Comment) Expression assist level: Expresses basic 25 - 49% of the time/requires cueing 50 - 75% of the time. Uses single words/gestures.  Social Interaction Social Interaction assist level: Interacts appropriately 50 - 74% of the time - May be physically or verbally inappropriate.  Problem Solving Problem solving assist level: Solves basic 75 - 89% of the time/requires cueing 10 - 24% of the time  Memory Memory assist level: Recognizes or recalls 50 - 74% of the time/requires cueing 25 - 49% of the time    Pain Pain Assessment Pain Assessment: No/denies pain  Therapy/Group: Individual Therapy  James Cortez, Selinda Orion 12/18/2015, 4:11 PM

## 2015-12-18 NOTE — Progress Notes (Signed)
Physical Therapy Session Note  Patient Details  Name: James Cortez MRN: [id number] Date of Birth: January 27, 1966  Today's Date: 12/18/2015 PT Individual Time: 1300-1416 PT Individual Time Calculation (min): 76 min    Short Term Goals: Week 1:  PT Short Term Goal 1 (Week 1): =LTGs due to ELOS  Skilled Therapeutic Interventions/Progress Updates:   Pt presented in chair with QRB agreeable to therapy. Performed various high level balance activities including basketball toss with one foot on step and on compliant surface. Performed duel task activities including ball toss with counting, and soccer kicks around cones for coordination. Performed obstacle course and allowing pt to engage in solving obstacles. Challenge locating rooms with min assist for remembering task with x 1 activity. NMR in high kneeling with forced use of RUE for separating/arranging pegs.  Pt returned to room and bed performing bed transfer with supervision, bed alarm on, and all needs met.   Therapy Documentation Precautions:  Precautions Precautions: Fall Precaution Comments: Rt inattention  Restrictions Weight Bearing Restrictions: No General:   Vital Signs: Therapy Vitals Temp: 97.9 F (36.6 C) Temp Source: Oral Pulse Rate: 63 Resp: 20 BP: 109/62 Patient Position (if appropriate): Lying Oxygen Therapy SpO2: 98 % O2 Device: Not Delivered Pain: Pain Assessment Pain Assessment: No/denies pain Mobility:   Locomotion :    Trunk/Postural Assessment :    Balance:   Exercises:   Other Treatments:     See Function Navigator for Current Functional Status.   Therapy/Group: Individual Therapy  Zian Delair  Da Michelle, PTA  12/18/2015, 4:34 PM

## 2015-12-19 ENCOUNTER — Inpatient Hospital Stay (HOSPITAL_COMMUNITY): Payer: Self-pay | Admitting: Physical Therapy

## 2015-12-19 ENCOUNTER — Inpatient Hospital Stay (HOSPITAL_COMMUNITY): Payer: Self-pay | Admitting: Speech Pathology

## 2015-12-19 ENCOUNTER — Inpatient Hospital Stay (HOSPITAL_COMMUNITY): Payer: Self-pay | Admitting: Occupational Therapy

## 2015-12-19 NOTE — Progress Notes (Signed)
Speech Language Pathology Daily Session Note  Patient Details  Name: James Cortez MRN: [id number] Date of Birth: April 07, 1965  Today's Date: 12/19/2015 SLP Individual Time: [id number] SLP Individual Time Calculation (min): 43 min   Short Term Goals: Week 1: SLP Short Term Goal 1 (Week 1): Patient will maintain selective attention for 30 minutes given supervision verbal cues in a minimally distracting environment.  SLP Short Term Goal 2 (Week 1): Patient will demonstrate basic, functional problem-solving given supervision verbal cues.  SLP Short Term Goal 3 (Week 1): Given Mod A multimodal phonemic, written, and visual cues, patient will communicate basic wants/needs at the phrase level.  SLP Short Term Goal 4 (Week 1): Patient will follow basic 2-step commands given Min A multimodal cues. SLP Short Term Goal 5 (Week 1): Patient will display no overt s/s of aspiration on current diet with Mod A verbal cues for use of swallowing compensatory strategies.  SLP Short Term Goal 6 (Week 1): Patient will consume trials of thin liquids with minimal overt s/s of aspiration across 2 sessions given Mod A verbal cues prior to follow-up MBS.   Skilled Therapeutic Interventions: Skilled treatment session focused on dysphagia and cognitive goals. SLP facilitated session by providing supervision verbal cues for problem solving during oral care while at the sink. Patient consumed trials of thin liquids via cup with subtle throat clear on 30% of trials, suspect due to large sips. Patient also demonstrated right anterior spillage with larger sips via cup despite Mod A verbal cues. Recommend repeat MBS early next week to assess for possible upgrade. SLP also facilitated session by providing Mod A sentence completion and phonemic for patient to name objects and then describe their function at the phrase level. Patient named all items with 100% accuracy but switched between [place] and [place]. In most cases, if patient  could name the word in [place], he could not name it in Spanish and vice versa. Therefore, clinician had patient name the items in both [place] and Spanish throughout the session. Patient also demonstrated reading comprehension at the word level with 100% accuracy and 85% accuracy at the phrase level. Patient also required Mod A verbal cues to decode English at the phrase level. Patient demonstrated selective attention to tasks in a mildly distracting environment for ~30 minutes with Mod I. Patient left sitting EOB with all needs within reach and family present. Continue with current plan of care.   Function:  Eating Eating   Modified Consistency Diet: Yes Eating Assist Level: Supervision or verbal cues           Cognition Comprehension Comprehension assist level: Understands basic 75 - 89% of the time/ requires cueing 10 - 24% of the time  Expression   Expression assist level: Expresses basic 50 - 74% of the time/requires cueing 25 - 49% of the time. Needs to repeat parts of sentences.  Social Interaction Social Interaction assist level: Interacts appropriately 50 - 74% of the time - May be physically or verbally inappropriate.  Problem Solving Problem solving assist level: Solves basic 75 - 89% of the time/requires cueing 10 - 24% of the time  Memory Memory assist level: Recognizes or recalls 50 - 74% of the time/requires cueing 25 - 49% of the time    Pain No/Denies Pain   Therapy/Group: Individual Therapy  Lorien Shingler, [place] 12/19/2015, 3:13 PM

## 2015-12-19 NOTE — Progress Notes (Signed)
Occupational Therapy Session Note  Patient Details  Name: James Cortez MRN: [id number] Date of Birth: 03-Jan-1966  Today's Date: 12/19/2015 OT Individual Time: 0900-1000 OT Individual Time Calculation (min): 60 min     Short Term Goals: Week 1:  OT Short Term Goal 1 (Week 1): LTG= STG 2/2 estimated short LOS Week 2:     Skilled Therapeutic Interventions/Progress Updates:    Addressed balance in standing, RUE AROM , functional mobility.  Pt ambulated to shower and performed bathing with sBA; dressed with SBA.  Assisted in making bed with one LOB but pt able to regain with no assistance.  Ambulated to gym.  Performed velcro catch and horseshoes in standing for balance, and RUE movements.  Ambulated back to room and left with children in room.  Therapy Documentation Precautions:  Precautions Precautions: Fall Precaution Comments: Rt inattention  Restrictions Weight Bearing Restrictions: No General:   Vital Signs: Therapy Vitals Temp: 97.9 F (36.6 C) Temp Source: Oral Pulse Rate: 62 Resp: 18 BP: 101/61 Patient Position (if appropriate): Sitting Oxygen Therapy SpO2: 97 % O2 Device: Not Delivered Pain:   ADL: ADL ADL Comments: Please see functional navigator Exercises:   Other Treatments:    See Function Navigator for Current Functional Status.   Therapy/Group: Individual Therapy  Lisa Roca 12/19/2015, 5:30 PM

## 2015-12-19 NOTE — Progress Notes (Signed)
Physical Therapy Session Note  Patient Details  Name: James Cortez MRN: [id number] Date of Birth: 1965-03-21  Today's Date: 12/19/2015 PT Individual Time: 1000-1100; 1500-1530 PT Individual Time Calculation (min): 60 min and 30 min   Short Term Goals: Week 1:  PT Short Term Goal 1 (Week 1): =LTGs due to ELOS  Skilled Therapeutic Interventions/Progress Updates:   Pt presented in bed agreeable to therapy. Performed NMR in high kneeling with emphasis on use of RUE for pipe tree.  Pt required x2 verbal cues for assistance for completion.  Target toe  taps to specific colors with RLE requiring reinforcement for tapping correct color approx 60% of time. Pt with x 1LOB which pt was able to self correct. Duel task gait with counting up to 100 with increased difficulty with higher numbers requiring mod prompts. [place] LOS 3 trials, platform L8 performed 3:30/12%, 1:50/19% 1:40/22%.Continued balance activities ball to rebounder on compliant surface with no LOB noted. Pt returned to room, left in chair with QRB applied and all current needs met.   Session 2 Pt presented sitting at EOB having just completed ST.  Performed high balance/coordination activities in gym including following patterns on agility ladder always leading with RLE. Pt requiring mod assist for following pattern on 1st trial with improved carryover with trials 2-4 (3 different patterns performed)  Step lunges onto [place]u tapping specific target. Pt would hit target approx 70% with improvement noted with repetition. Pt performed alternating toe taps to 6" step with increased speed with second trial.  Pt returned to room and request to lay in bed. Performed bed mobility with mod I, bed alarm set. Pt left in room with family present and needs met.   Therapy Documentation Precautions:  Precautions Precautions: Fall Precaution Comments: Rt inattention  Restrictions Weight Bearing Restrictions: No General:   Vital Signs:   Pain: Pain  Assessment Pain Assessment: No/denies pain Mobility:   Locomotion :    Trunk/Postural Assessment :    Balance:   Exercises:   Other Treatments:     See Function Navigator for Current Functional Status.   Therapy/Group: Individual Therapy  James Cortez  James Cortez, PTA  12/19/2015, 12:58 PM

## 2015-12-19 NOTE — Progress Notes (Signed)
Subjective/Complaints: Asking about bandage over loop recorder, also whether it is normal to feel it  ROS-  No CP, SOB N/V/D Objective: Vital Signs: Blood pressure 114/72, pulse 66, temperature 98.1 F (36.7 C), temperature source Oral, resp. rate 18, height 5\' 7"  (1.702 m), weight 68.8 kg (151 lb 10.8 oz), SpO2 96 %. No results found. No results found for this or any previous visit (from the past 72 hour(s)).    General: No acute distress Mood and affect are appropriate Heart: Regular rate and rhythm no rubs murmurs or extra sounds, loop recorder site, bruising , drsg removed, no erythema or drainage, steristrips intact Lungs: Clear to auscultation, breathing unlabored, no rales or wheezes Abdomen: Positive bowel sounds, soft nontender to palpation, nondistended Extremities: No clubbing, cyanosis, or edema, IV right forearm Skin: No evidence of breakdown, no evidence of rash Neurologic: Cranial nerves II through XII intact, motor strength is 5/5 in Left  deltoid, bicep, tricep, grip, hip flexor, knee extensors, ankle dorsiflexor and plantar flexor 4-/5 on RIght side Sensory exam normal sensation to light touch and proprioception in bilateral upper and lower extremities  Cerebellar exam reduced finger to nose to finger on RIght     Musculoskeletal: Full range of motion in all 4 extremities. No joint swelling   Assessment/Plan: 1. Functional deficits secondary to Left MCA infarct which require 3+ hours per day of interdisciplinary therapy in a comprehensive inpatient rehab setting. Physiatrist is providing close team supervision and 24 hour management of active medical problems listed below. Physiatrist and rehab team continue to assess barriers to discharge/monitor patient progress toward functional and medical goals. FIM: Function - Bathing Position: Shower Body parts bathed by patient: Right arm, Left arm, Chest, Abdomen, Front perineal area, Buttocks, Right upper leg, Left upper  leg, Right lower leg, Left lower leg Bathing not applicable: Back Assist Level: Supervision or verbal cues  Function- Upper Body Dressing/Undressing What is the patient wearing?: Pull over shirt/dress Pull over shirt/dress - Perfomed by patient: Thread/unthread right sleeve, Thread/unthread left sleeve, Put head through opening, Pull shirt over trunk Assist Level: Set up Function - Lower Body Dressing/Undressing What is the patient wearing?: Underwear, Pants, Non-skid slipper socks Position: Sitting EOB Underwear - Performed by patient: Thread/unthread right underwear leg, Thread/unthread left underwear leg, Pull underwear up/down Pants- Performed by patient: Thread/unthread right pants leg, Thread/unthread left pants leg, Pull pants up/down Non-skid slipper socks- Performed by patient: Don/doff right sock, Don/doff left sock Socks - Performed by helper: Don/doff right sock, Don/doff left sock Assist for footwear: Supervision/touching assist Assist for lower body dressing: Touching or steadying assistance (Pt > 75%)  Function - Toileting Toileting steps completed by patient: Adjust clothing prior to toileting, Performs perineal hygiene, Adjust clothing after toileting Toileting steps completed by helper: Adjust clothing after toileting (per Leilani Able, NT) Assist level: Supervision or verbal cues  Function - Air cabin crew transfer assistive device: Grab bar Assist level to toilet: Touching or steadying assistance (Pt > 75%) Assist level from toilet: Touching or steadying assistance (Pt > 75%)  Function - Chair/bed transfer Chair/bed transfer method: Ambulatory Chair/bed transfer assist level: Supervision or verbal cues Chair/bed transfer assistive device: Armrests Chair/bed transfer details: Verbal cues for precautions/safety (cues for attention to R side)  Function - Locomotion: Wheelchair Will patient use wheelchair at discharge?: No Wheelchair activity did not  occur: N/A (pt ambulatory on unit) Function - Locomotion: Ambulation Assistive device: No device Max distance: 300 Assist level: Supervision or verbal cues Assist level:  Supervision or verbal cues Assist level: Supervision or verbal cues Assist level: Supervision or verbal cues Assist level: Supervision or verbal cues  Function - Comprehension Comprehension: Auditory Comprehension assist level: Understands basic 75 - 89% of the time/ requires cueing 10 - 24% of the time  Function - Expression Expression: Verbal Expression assistive device: Other (Comment) Expression assist level: Expresses basic 25 - 49% of the time/requires cueing 50 - 75% of the time. Uses single words/gestures.  Function - Social Interaction Social Interaction assist level: Interacts appropriately 50 - 74% of the time - May be physically or verbally inappropriate.  Function - Problem Solving Problem solving assist level: Solves basic 75 - 89% of the time/requires cueing 10 - 24% of the time  Function - Memory Memory assist level: Recognizes or recalls 50 - 74% of the time/requires cueing 25 - 49% of the time Patient normally able to recall (first 3 days only): That he or she is in a hospital, Location of own room, Current season, Staff names and faces    Medical Problem List and Plan: 1.  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 Status post loop recorder- site ok             -CIR PT, OT, SLP cont program 2.  DVT Prophylaxis/Anticoagulation: Subcutaneous Lovenox. Monitor platelet counts for any signs of bleeding 3. Pain Management: Tylenol as needed 4. Aphasia/dysphagia.Dysphagia #1 nectar thick liquids. Follow-up speech therapy             -75-100% meals  plate guard but does use R hand 5. Neuropsych: This patient is not capable of making decisions on his own behalf. 6.  Skin/Wound Care: Routine skin checks 7. Fluids/Electrolytes/Nutrition: Routine I&O with follow-up chemistries upon admission tomorrow, labs ok 8.Tobacco abuse.Counseling  LOS (Days) 4 A FACE TO FACE EVALUATION WAS PERFORMED  Shamyia Grandpre E 12/19/2015, 8:40 AM

## 2015-12-20 ENCOUNTER — Inpatient Hospital Stay (HOSPITAL_COMMUNITY): Payer: Self-pay | Admitting: Physical Therapy

## 2015-12-20 NOTE — Progress Notes (Signed)
Subjective/Complaints: No issues overnite, denies pain ROS-  No CP, SOB N/V/D Objective: Vital Signs: Blood pressure 103/64, pulse 71, temperature 97.9 F (36.6 C), temperature source Oral, resp. rate 18, height 5\' 7"  (1.702 m), weight 71.1 kg (156 lb 12 oz), SpO2 97 %. No results found. No results found for this or any previous visit (from the past 72 hour(s)).    General: No acute distress Mood and affect are appropriate Heart: Regular rate and rhythm no rubs murmurs or extra sounds, loop recorder site, bruising , drsg removed, no erythema or drainage, steristrips intact Lungs: Clear to auscultation, breathing unlabored, no rales or wheezes Abdomen: Positive bowel sounds, soft nontender to palpation, nondistended Extremities: No clubbing, cyanosis, or edema, IV right forearm Skin: No evidence of breakdown, no evidence of rash Neurologic: Cranial nerves II through XII intact, motor strength is 5/5 in Left  deltoid, bicep, tricep, grip, hip flexor, knee extensors, ankle dorsiflexor and plantar flexor 4-/5 on RIght side Sensory exam normal sensation to light touch and proprioception in bilateral upper and lower extremities  Cerebellar exam reduced finger to nose to finger on RIght     Musculoskeletal: Full range of motion in all 4 extremities. No joint swelling   Assessment/Plan: 1. Functional deficits secondary to Left MCA infarct which require 3+ hours per day of interdisciplinary therapy in a comprehensive inpatient rehab setting. Physiatrist is providing close team supervision and 24 hour management of active medical problems listed below. Physiatrist and rehab team continue to assess barriers to discharge/monitor patient progress toward functional and medical goals. FIM: Function - Bathing Position: Shower Body parts bathed by patient: Right arm, Left arm, Chest, Abdomen, Front perineal area, Buttocks, Right upper leg, Left upper leg, Right lower leg, Left lower leg Bathing  not applicable: Back Assist Level: Supervision or verbal cues  Function- Upper Body Dressing/Undressing What is the patient wearing?: Pull over shirt/dress Pull over shirt/dress - Perfomed by patient: Thread/unthread right sleeve, Thread/unthread left sleeve, Put head through opening, Pull shirt over trunk Assist Level: Set up Function - Lower Body Dressing/Undressing What is the patient wearing?: Underwear, Pants, Non-skid slipper socks Position: Sitting EOB Underwear - Performed by patient: Thread/unthread right underwear leg, Thread/unthread left underwear leg, Pull underwear up/down Pants- Performed by patient: Thread/unthread right pants leg, Thread/unthread left pants leg, Pull pants up/down Non-skid slipper socks- Performed by patient: Don/doff right sock, Don/doff left sock Socks - Performed by patient: Don/doff right sock, Don/doff left sock Socks - Performed by helper: Don/doff right sock, Don/doff left sock Assist for footwear: Supervision/touching assist Assist for lower body dressing: Touching or steadying assistance (Pt > 75%), Supervision or verbal cues  Function - Toileting Toileting steps completed by patient: Adjust clothing prior to toileting, Performs perineal hygiene, Adjust clothing after toileting Toileting steps completed by helper: Adjust clothing after toileting Assist level: Supervision or verbal cues  Function - Air cabin crew transfer assistive device: Grab bar Assist level to toilet: Touching or steadying assistance (Pt > 75%) Assist level from toilet: Touching or steadying assistance (Pt > 75%)  Function - Chair/bed transfer Chair/bed transfer method: Ambulatory Chair/bed transfer assist level: Supervision or verbal cues Chair/bed transfer assistive device: Armrests Chair/bed transfer details: Verbal cues for precautions/safety  Function - Locomotion: Wheelchair Will patient use wheelchair at discharge?: No Wheelchair activity did not occur:  N/A (pt ambulatory on unit) Function - Locomotion: Ambulation Assistive device: No device Max distance: 300 Assist level: Supervision or verbal cues Assist level: Supervision or verbal cues Assist  level: Supervision or verbal cues Assist level: Supervision or verbal cues Assist level: Supervision or verbal cues  Function - Comprehension Comprehension: Auditory Comprehension assist level: Understands basic 75 - 89% of the time/ requires cueing 10 - 24% of the time  Function - Expression Expression: Verbal Expression assistive device: Other (Comment) Expression assist level: Expresses basic 50 - 74% of the time/requires cueing 25 - 49% of the time. Needs to repeat parts of sentences.  Function - Social Interaction Social Interaction assist level: Interacts appropriately 50 - 74% of the time - May be physically or verbally inappropriate.  Function - Problem Solving Problem solving assist level: Solves basic 75 - 89% of the time/requires cueing 10 - 24% of the time  Function - Memory Memory assist level: Recognizes or recalls 50 - 74% of the time/requires cueing 25 - 49% of the time Patient normally able to recall (first 3 days only): That he or she is in a hospital, Location of own room, Current season, Staff names and faces    Medical Problem List and Plan: 1.  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 Status post loop recorder-              -CIR PT, OT, SLP cont program 2.  DVT Prophylaxis/Anticoagulation: Subcutaneous Lovenox. Monitor platelet counts for any signs of bleeding 3. Pain Management: Tylenol as needed 4. Aphasia/dysphagia.Dysphagia #1 nectar thick liquids. Follow-up speech therapy       5. Neuropsych: This patient is not capable of making decisions on his own behalf. 6. Skin/Wound Care: Routine skin checks 7.  Fluids/Electrolytes/Nutrition: Routine I&O with follow-up chemistries upon admission tomorrow, 8.Tobacco abuse.Counseling  LOS (Days) 5 A FACE TO FACE EVALUATION WAS PERFORMED  Sarrinah Gardin E 12/20/2015, 9:32 AM

## 2015-12-20 NOTE — Progress Notes (Signed)
Physical Therapy Session Note  Patient Details  Name: James Cortez MRN: [id number] Date of Birth: 08/21/1965  Today's Date: 12/20/2015 PT Individual Time: 1435-1515 PT Individual Time Calculation (min): 40 min    Short Term Goals: Week 1:  PT Short Term Goal 1 (Week 1): =LTGs due to ELOS  Skilled Therapeutic Interventions/Progress Updates:    Pt received in bed & agreeable to tx, denying c/o pain. Throughout session pt ambulated throughout unit with supervision overall. Pt utilized cybex kinetron in standing, up to 25 cm/sec with BUE support and cuing for upright posture & forward gaze. High level balance training completed with rail in hallway: braiding & backwards walking. Utilized [employer] operational officer with challenges to balance anterior/posteriorly and laterally with pt's eyes open, closed, and with perturbations all without UE support.  Utilized dynavision in standing with RUE only with focus on attention to R side and R UE neuro re-ed. Pt with slightly increased reaction time to upper right quadrant. At end of session pt left sitting in recliner in room with QRB in place, all needs within reach & family present.   Therapy Documentation Precautions:  Precautions Precautions: Fall Precaution Comments: Rt inattention  Restrictions Weight Bearing Restrictions: No   See Function Navigator for Current Functional Status.   Therapy/Group: Individual Therapy  Waunita Schooner 12/20/2015, 3:26 PM

## 2015-12-21 ENCOUNTER — Inpatient Hospital Stay (HOSPITAL_COMMUNITY): Payer: Self-pay

## 2015-12-21 ENCOUNTER — Inpatient Hospital Stay (HOSPITAL_COMMUNITY): Payer: Self-pay | Admitting: Occupational Therapy

## 2015-12-21 ENCOUNTER — Inpatient Hospital Stay (HOSPITAL_COMMUNITY): Payer: Self-pay | Admitting: Speech Pathology

## 2015-12-21 NOTE — Progress Notes (Signed)
Physical Therapy Note  Patient Details  Name: James Cortez MRN: [id number] Date of Birth: 1965-10-13 Today's Date: 12/21/2015  1300-1415, 75 min individual tx Pain: none reported  Translator present.   Dual task during gait to gym: counting backwards by 2s from 20; pt unable after 18 and stated he didn't know, in [place]. When attempting to count backwards by 1s from 20, pt was accurate in English to 13, then was unable.  neuromuscular re-education via forced use for alternating reciprocal movement x 4 extremities and trunk rotation at level 5 on N[place] x 7 minutes. High level dynamic standing balance activities: sidestepping while catching and tossing a beach ball to another person progressing to naming animals as he caught, 1/2 kneeling with R/L knee up while tossing and catching a ball progressing to naming foods as he caught ball, horsehoes throwing over a 16' distance.  No LOB with any activity, but pt did kick a marker cone with R foot without awareness. Balance challenge standing on red disc while using L hand to match playing cards on board overhead and to R; no LOB, but pt's R foot started to slide off of disc several times without pt being aware. During dual task of naming, pt always stopped moving and had limited ability to come up with names verbally. Quadruped > 3 pt while reaching with contralateral hand x 8 each; with difficult during L knee single limb support and R hip extension, while reaching with L hand. Floor transfer safely supervision without cues. Gait to return to room. Pt left resting in recliner with quick release belt donned and all needs within reach.  See function navigator for current status.  Chala Gul 12/21/2015, 10:20 AM

## 2015-12-21 NOTE — Progress Notes (Signed)
Occupational Therapy Session Note  Patient Details  Name: James Cortez MRN: [id number] Date of Birth: Sep 13, 1965  Today's Date: 12/21/2015 OT Individual Time: 0900-1000 OT Individual Time Calculation (min): 60 min     Short Term Goals: Week 1:  OT Short Term Goal 1 (Week 1): LTG= STG 2/2 estimated short LOS  Skilled Therapeutic Interventions/Progress Updates:    Treatment session with focus on ADL retraining, functional mobility, and functional use of dominant RUE.  Pt ambulated around room to gather clothing prior to bathing in room shower with supervision for balance.  Pt completed bathing at overall supervision level at sit > stand for increased safety.  Dressing completed with min assist for standing balance as pt attempted to don pants in standing, min cues for safety to sit to don pants to decrease risk of falling.  Oral care completed in standing with setup to obtain toothpaste.  Engaged in [place] and dynamic standing balance while focusing on RUE motor control and coordination.  Engaged in [place] ball back and forth between [place] and Lt hand, progressing to tossing and then bouncing with just Rt hand.  Pt demonstrated decreased coordination with increased challenge of ball toss.  Engaged in motor control and coordination with picking up checker pieces placed between various cups to challenge motor control as well as fine motor control with picking up smaller items.  Incorporated cup stacking and rotation to increase challenge with motor control and coordination with pt demonstrating increased difficulty with rotation of cups.  Discussed functional carryover of all tasks.  Incorporated crossing midline and trunk rotation while standing on small wedge to further challenge standing balance.  Pt returned to room while carrying cup of water to increase attention to RUE as well as awareness of surrounding without spilling any water. Precautions:  Precautions Precautions: Fall Precaution Comments:  Rt inattention  Restrictions Weight Bearing Restrictions: No General:   Vital Signs: Therapy Vitals Temp: 98.4 F (36.9 C) Temp Source: Oral Pulse Rate: 63 Resp: 19 BP: 103/63 Patient Position (if appropriate): Sitting Oxygen Therapy SpO2: 96 % O2 Device: Not Delivered Pain: Pain Assessment Pain Assessment: No/denies pain  See Function Navigator for Current Functional Status.   Therapy/Group: Individual Therapy  [name] 12/21/2015, 3:30 PM

## 2015-12-21 NOTE — Progress Notes (Signed)
Subjective/Complaints: No problems overnite per pt.  No pain ROS-  No CP, SOB N/V/D Objective: Vital Signs: Blood pressure (!) 97/51, pulse 61, temperature 97.8 F (36.6 C), temperature source Oral, resp. rate 18, height 5\' 7"  (1.702 m), weight 71.4 kg (157 lb 6.5 oz), SpO2 95 %. No results found. No results found for this or any previous visit (from the past 72 hour(s)).    General: No acute distress Mood and affect are appropriate Heart: Regular rate and rhythm no rubs murmurs or extra sounds, loop recorder site, bruising , drsg removed, no erythema or drainage, steristrips intact Lungs: Clear to auscultation, breathing unlabored, no rales or wheezes Abdomen: Positive bowel sounds, soft nontender to palpation, nondistended Extremities: No clubbing, cyanosis, or edema, IV right forearm Skin: No evidence of breakdown, no evidence of rash Neurologic: Cranial nerves II through XII intact, motor strength is 5/5 in Left  deltoid, bicep, tricep, grip, hip flexor, knee extensors, ankle dorsiflexor and plantar flexor 4-/5 on RIght side Sensory exam normal sensation to light touch and proprioception in bilateral upper and lower extremities  Cerebellar exam reduced finger to nose to finger on RIght     Musculoskeletal: Full range of motion in all 4 extremities. No joint swelling   Assessment/Plan: 1. Functional deficits secondary to Left MCA infarct which require 3+ hours per day of interdisciplinary therapy in a comprehensive inpatient rehab setting. Physiatrist is providing close team supervision and 24 hour management of active medical problems listed below. Physiatrist and rehab team continue to assess barriers to discharge/monitor patient progress toward functional and medical goals. FIM: Function - Bathing Position: Shower Body parts bathed by patient: Right arm, Left arm, Chest, Abdomen, Front perineal area, Buttocks, Right upper leg, Left upper leg, Right lower leg, Left lower  leg Bathing not applicable: Back Assist Level: Supervision or verbal cues  Function- Upper Body Dressing/Undressing What is the patient wearing?: Pull over shirt/dress Pull over shirt/dress - Perfomed by patient: Thread/unthread right sleeve, Thread/unthread left sleeve, Put head through opening, Pull shirt over trunk Assist Level: Set up Function - Lower Body Dressing/Undressing What is the patient wearing?: Underwear, Pants, Non-skid slipper socks Position: Sitting EOB Underwear - Performed by patient: Thread/unthread right underwear leg, Thread/unthread left underwear leg, Pull underwear up/down Pants- Performed by patient: Thread/unthread right pants leg, Thread/unthread left pants leg, Pull pants up/down Non-skid slipper socks- Performed by patient: Don/doff right sock, Don/doff left sock Socks - Performed by patient: Don/doff right sock, Don/doff left sock Socks - Performed by helper: Don/doff right sock, Don/doff left sock Assist for footwear: Supervision/touching assist Assist for lower body dressing: Touching or steadying assistance (Pt > 75%), Supervision or verbal cues  Function - Toileting Toileting steps completed by patient: Adjust clothing prior to toileting, Performs perineal hygiene, Adjust clothing after toileting Toileting steps completed by helper: Adjust clothing after toileting Toileting Assistive Devices: Grab bar or rail Assist level: Supervision or verbal cues  Function Midwife transfer assistive device: Grab bar Assist level to toilet: Touching or steadying assistance (Pt > 75%) Assist level from toilet: Touching or steadying assistance (Pt > 75%)  Function - Chair/bed transfer Chair/bed transfer method: Ambulatory Chair/bed transfer assist level: Supervision or verbal cues Chair/bed transfer assistive device: Armrests Chair/bed transfer details: Verbal cues for precautions/safety  Function - Locomotion: Wheelchair Will patient use  wheelchair at discharge?: No Wheelchair activity did not occur: N/A (pt ambulatory on unit) Function - Locomotion: Ambulation Assistive device: No device Max distance: 150 ft Assist  level: Supervision or verbal cues Assist level: Supervision or verbal cues Assist level: Supervision or verbal cues Assist level: Supervision or verbal cues Assist level: Supervision or verbal cues  Function - Comprehension Comprehension: Auditory Comprehension assist level: Understands basic 75 - 89% of the time/ requires cueing 10 - 24% of the time  Function - Expression Expression: Verbal Expression assistive device: Other (Comment) (pt is mostly spanish speaking, understands some [place]. Family at bedside to help translate) Expression assist level: Expresses basic 50 - 74% of the time/requires cueing 25 - 49% of the time. Needs to repeat parts of sentences.  Function - Social Interaction Social Interaction assist level: Interacts appropriately 50 - 74% of the time - May be physically or verbally inappropriate.  Function - Problem Solving Problem solving assist level: Solves basic 75 - 89% of the time/requires cueing 10 - 24% of the time  Function - Memory Memory assist level: Recognizes or recalls 50 - 74% of the time/requires cueing 25 - 49% of the time Patient normally able to recall (first 3 days only): That he or she is in a hospital, Location of own room, Current season, Staff names and faces    Medical Problem List and Plan: 1.  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 Status post loop recorder-              -CIR PT, OT, SLP cont program 2.  DVT Prophylaxis/Anticoagulation: Subcutaneous Lovenox. Monitor platelet counts for any signs of bleeding 3. Pain Management: Tylenol as needed 4. Aphasia/dysphagia.Dysphagia #1 nectar thick liquids. Follow-up  speech therapy       5. Neuropsych: This patient is not capable of making decisions on his own behalf. 6. Skin/Wound Care: Routine skin checks 7. Fluids/Electrolytes/Nutrition: Routine I&O with follow-up chemistries upon admission tomorrow,100% meals 8.Tobacco abuse.Counseling  LOS (Days) 6 A FACE TO FACE EVALUATION WAS PERFORMED  Selicia Windom E 12/21/2015, 7:02 AM

## 2015-12-21 NOTE — Progress Notes (Signed)
Speech Language Pathology Daily Session Note  Patient Details  Name: James Cortez MRN: [id number] Date of Birth: 1965-09-12  Today's Date: 12/21/2015 SLP Individual Time: 1102-1200 SLP Individual Time Calculation (min): 58 min   Short Term Goals: Week 1: SLP Short Term Goal 1 (Week 1): Patient will maintain selective attention for 30 minutes given supervision verbal cues in a minimally distracting environment.  SLP Short Term Goal 2 (Week 1): Patient will demonstrate basic, functional problem-solving given supervision verbal cues.  SLP Short Term Goal 3 (Week 1): Given Mod A multimodal phonemic, written, and visual cues, patient will communicate basic wants/needs at the phrase level.  SLP Short Term Goal 4 (Week 1): Patient will follow basic 2-step commands given Min A multimodal cues. SLP Short Term Goal 5 (Week 1): Patient will display no overt s/s of aspiration on current diet with Mod A verbal cues for use of swallowing compensatory strategies.  SLP Short Term Goal 6 (Week 1): Patient will consume trials of thin liquids with minimal overt s/s of aspiration across 2 sessions given Mod A verbal cues prior to follow-up MBS.   Skilled Therapeutic Interventions:  Pt was seen for skilled ST targeting goals for communication and dysphagia.  SLP facilitated the session with trials of advanced textures to continue working towards repeat objective assessment.  Pt demonstrated no overt s/s of aspiration with small controled cup sips of thin liquids but demonstrated immediate, strong reflexive cough with mixed dys 3 solids and liquids.  Pt was able to clear dys 2 solids from the oral cavity without difficulty but needed supervision verbal cues for use of manual assist following lingual sweep to clear dys 3 solids.  Intermittent supervision verbal and cues were needed to monitor and correct anterior labial loss of saliva.  Recommend repeat objective study tomorrow to determine readiness for diet advancement  given ongoing improvements noted at bedside.  Therapist also facilitated the session with a picture sequencing task targeting verbal expression.  Pt needed mod assist verbal cues to monitor and correct verbal errors at the phrase level when describing actions and objects in pictures.  Pt was left in recliner with family at bedside at the end of today's therapy session.  Continue per current plan of care.    Function:  to  Eating Eating   Modified Consistency Diet: Yes Eating Assist Level: Supervision or verbal cues   Eating Set Up Assist For: Opening containers       Cognition Comprehension Comprehension assist level: Follows basic conversation/direction with extra time/assistive device  Expression   Expression assist level: Expresses basic 50 - 74% of the time/requires cueing 25 - 49% of the time. Needs to repeat parts of sentences.  Social Interaction Social Interaction assist level: Interacts appropriately 75 - 89% of the time - Needs redirection for appropriate language or to initiate interaction.  Problem Solving Problem solving assist level: Solves basic 75 - 89% of the time/requires cueing 10 - 24% of the time  Memory Memory assist level: Recognizes or recalls 75 - 89% of the time/requires cueing 10 - 24% of the time    Pain Pain Assessment Pain Assessment: No/denies pain  Therapy/Group: Individual Therapy  Abbygail [place], Selinda Orion 12/21/2015, 12:20 PM

## 2015-12-22 ENCOUNTER — Inpatient Hospital Stay (HOSPITAL_COMMUNITY): Payer: Self-pay | Admitting: Occupational Therapy

## 2015-12-22 ENCOUNTER — Inpatient Hospital Stay (HOSPITAL_COMMUNITY): Payer: Self-pay | Admitting: Speech Pathology

## 2015-12-22 ENCOUNTER — Inpatient Hospital Stay (HOSPITAL_COMMUNITY): Payer: Self-pay

## 2015-12-22 ENCOUNTER — Inpatient Hospital Stay (HOSPITAL_COMMUNITY): Payer: Self-pay | Admitting: Physical Therapy

## 2015-12-22 LAB — CREATININE, SERUM
Creatinine, Ser: 0.91 mg/dL (ref 0.61–1.24)
GFR calc Af Amer: >60 mL/min (ref >60)
GFR calc non Af Amer: >60 mL/min (ref >60)

## 2015-12-22 NOTE — Progress Notes (Signed)
Subjective/Complaints: No c/os no abd pain ROS-  No CP, SOB N/V/D Objective: Vital Signs: Blood pressure (!) 109/58, pulse (!) 59, temperature 97.6 F (36.4 C), temperature source Oral, resp. rate 18, height '5\' 7"'$  (1.702 m), weight 70.1 kg (154 lb 8.7 oz), SpO2 98 %. No results found. Results for orders placed or performed during the hospital encounter of 12/15/15 (from the past 72 hour(s))  Creatinine, serum     Status: None   Collection Time: 12/22/15  5:26 AM  Result Value Ref Range   Creatinine, Ser 0.91 0.61 - 1.24 mg/dL   GFR calc non Af Amer >60 >60 mL/min   GFR calc Af Amer >60 >60 mL/min    Comment: (NOTE) The eGFR has been calculated using the CKD EPI equation. This calculation has not been validated in all clinical situations. eGFR's persistently <60 mL/min signify possible Chronic Kidney Disease.       General: No acute distress Mood and affect are appropriate Heart: Regular rate and rhythm no rubs murmurs or extra sounds, loop recorder site, bruising , drsg removed, no erythema or drainage, steristrips intact Lungs: Clear to auscultation, breathing unlabored, no rales or wheezes Abdomen: Positive bowel sounds, soft nontender to palpation, nondistended, ecchymosis LLQ Extremities: No clubbing, cyanosis, or edema, IV right forearm Skin: No evidence of breakdown, no evidence of rash Neurologic: Cranial nerves II through XII intact, motor strength is 5/5 in Left  deltoid, bicep, tricep, grip, hip flexor, knee extensors, ankle dorsiflexor and plantar flexor 4-/5 on RIght side Sensory exam normal sensation to light touch and proprioception in bilateral upper and lower extremities  Cerebellar exam reduced finger to nose to finger on RIght     Musculoskeletal: Full range of motion in all 4 extremities. No joint swelling   Assessment/Plan: 1. Functional deficits secondary to Left MCA infarct which require 3+ hours per day of interdisciplinary therapy in a comprehensive  inpatient rehab setting. Physiatrist is providing close team supervision and 24 hour management of active medical problems listed below. Physiatrist and rehab team continue to assess barriers to discharge/monitor patient progress toward functional and medical goals. FIM: Function - Bathing Position: Shower Body parts bathed by patient: Right arm, Left arm, Chest, Abdomen, Front perineal area, Buttocks, Right upper leg, Left upper leg, Right lower leg, Left lower leg Bathing not applicable: Back Assist Level: Supervision or verbal cues  Function- Upper Body Dressing/Undressing What is the patient wearing?: Pull over shirt/dress Pull over shirt/dress - Perfomed by patient: Thread/unthread right sleeve, Thread/unthread left sleeve, Put head through opening, Pull shirt over trunk Assist Level: Set up Function - Lower Body Dressing/Undressing What is the patient wearing?: Underwear, Pants, Non-skid slipper socks Position:  (seated on toilet, attempted donning pants in standing) Underwear - Performed by patient: Thread/unthread right underwear leg, Thread/unthread left underwear leg, Pull underwear up/down Pants- Performed by patient: Thread/unthread right pants leg, Thread/unthread left pants leg, Pull pants up/down Non-skid slipper socks- Performed by patient: Don/doff right sock, Don/doff left sock Socks - Performed by patient: Don/doff right sock, Don/doff left sock Socks - Performed by helper: Don/doff right sock, Don/doff left sock Assist for footwear: Setup Assist for lower body dressing: Touching or steadying assistance (Pt > 75%)  Function - Toileting Toileting steps completed by patient: Adjust clothing prior to toileting, Performs perineal hygiene, Adjust clothing after toileting Toileting steps completed by helper: Adjust clothing after toileting Toileting Assistive Devices: Grab bar or rail Assist level: Supervision or verbal cues  Function Midwife  transfer  assistive device: Grab bar Assist level to toilet: Supervision or verbal cues Assist level from toilet: Supervision or verbal cues  Function - Chair/bed transfer Chair/bed transfer method: Ambulatory Chair/bed transfer assist level: Supervision or verbal cues Chair/bed transfer assistive device: Armrests Chair/bed transfer details: Verbal cues for precautions/safety  Function - Locomotion: Wheelchair Will patient use wheelchair at discharge?: No Wheelchair activity did not occur: N/A (pt ambulatory on unit) Function - Locomotion: Ambulation Assistive device: No device Max distance: 150 Assist level: Supervision or verbal cues Assist level: Supervision or verbal cues Assist level: Supervision or verbal cues Assist level: Supervision or verbal cues Assist level: Supervision or verbal cues  Function - Comprehension Comprehension: Auditory Comprehension assist level: Follows basic conversation/direction with extra time/assistive device  Function - Expression Expression: Verbal Expression assistive device: Other (Comment) (Spanish speaking dies understand some English) Expression assist level: Expresses basic 50 - 74% of the time/requires cueing 25 - 49% of the time. Needs to repeat parts of sentences.  Function - Social Interaction Social Interaction assist level: Interacts appropriately 75 - 89% of the time - Needs redirection for appropriate language or to initiate interaction.  Function - Problem Solving Problem solving assist level: Solves basic 75 - 89% of the time/requires cueing 10 - 24% of the time  Function - Memory Memory assist level: Recognizes or recalls less than 25% of the time/requires cueing greater than 75% of the time Patient normally able to recall (first 3 days only): That he or she is in a hospital, Location of own room, Current season, Staff names and faces    Medical Problem List and Plan: 1.  Right hemiplegia, aphasia, apraxia, dysphagia secondary to  left MCA infarct in the setting of L M1 occlusion status post revascularization with mechanical thrombectomy and MCA stent. Status post loop recorder-              -CIR PT, OT, SLP cont program 2.  DVT Prophylaxis/Anticoagulation: Subcutaneous Lovenox. Monitor platelet counts for any signs of bleeding, has large bruise LLQ monitor 3. Pain Management: Tylenol as needed 4. Aphasia/dysphagia.Dysphagia #1 nectar thick liquids. Follow-up speech therapy       5. Neuropsych: This patient is not capable of making decisions on his own behalf. 6. Skin/Wound Care: Routine skin checks 7. Fluids/Electrolytes/Nutrition: Routine I&O with follow-up chemistries upon admission tomorrow,100% meals 8.Tobacco abuse.Counseling  LOS (Days) 7 A FACE TO FACE EVALUATION WAS PERFORMED  KIRSTEINS,ANDREW E 12/22/2015, 7:26 AM

## 2015-12-22 NOTE — Progress Notes (Signed)
Physical Therapy Session Note  Patient Details  Name: James Cortez MRN: [id number] Date of Birth: 1966/01/12  Today's Date: 12/22/2015 PT Individual Time: 1100-1156 PT Individual Time Calculation (min): 56 min   Short Term Goals: Week 1:  PT Short Term Goal 1 (Week 1): =LTGs due to ELOS  Skilled Therapeutic Interventions/Progress Updates:    Patient in recliner with interpreter present for session. Patient unable to verbalize physical/cognitive impairments despite cues. Gait training without device throughout rehab unit with supervision and no cues for path finding to familiar locations. Performed FGA with score of 23/30, see details below. Patient instructed in ambulating to and from gift shop from rehab unit with total A to utilize signs for path finding and increased time and min cues to interpret signs. Patient required max-total A to accurately select buttons to operate elevator due to R inattention. Patient able to recall 2/2 items to locate in gift shop with mod-max verbal cues for attention to R. Performed Dynavision mode A x 3 trials in standing to address R attention and functional use RUE with improvement on each trial: 38, 44, and 48 hits respectively. Performed NuStep using BUE/BLE at level 5 x 10 min for R NMR and muscular endurance. Patient left sitting in recliner with quick release belt donned and all needs within reach.   Functional Gait Assessment (FGA) Requirements: A marked 6-m (20-ft) walkway that is marked with a 30.48-cm (12-in) width.  _3_ 1. GAIT LEVEL SURFACE Instructions: Walk at your normal speed from here to the next mark (6 m[20 ft]). Grading: [place] the highest category that applies. (3) Normal-Walks 6 m (20 ft) in less than 5.5 seconds, no assistive devices, good speed, no evidence for imbalance, normal gait pattern, deviates no more than 15.24 cm (6 in) outside of the 30.48-cm (12-in) walkway width. (2) Mild impairment-Walks 6 m (20 ft) in less than 7 seconds but  greater than 5.5 seconds, uses assistive device, slower speed, mild gait deviations, or deviates 15.24-25.4 cm (6-10 in) outside of the 30.48-cm (12-in) walkway width. (1) Moderate impairment-Walks 6 m (20 ft), slow speed, abnormal gait pattern, evidence for imbalance, or deviates 25.4-38.1 cm (10-15 in) outside of the 30.48-cm (12-in) walkway width. Requires more than 7 seconds to ambulate 6 m (20 ft). (0) Severe impairment-Cannot walk 6 m (20 ft) without assistance,severe gait deviations or imbalance, deviates greater than 38.1 cm (15 in) outside of the 30.48-cm (12-in) walkway width or reaches and touches the wall.  _2_ 2. CHANGE IN GAIT SPEED Instructions: Begin walking at your normal pace (for 1.5 m [5 ft]). When I tell you "go," walk as fast as you can (for 1.5 m [5 ft]). When I tell you "slow," walk as slowly as you can (for 1.5 m [5 ft]). Grading: [place] the highest category that applies. (3) Normal-Able to smoothly change walking speed without loss of balance or gait deviation. Shows a significant difference in walking speeds between normal, fast, and slow speeds. Deviates no more than 15.24 cm (6 in) outside of the 30.48-cm (12-in) walkway width. (2) Mild impairment-Is able to change speed but demonstrates mild gait deviations, deviates 15.24-25.4 cm (6-10 in) outside of the 30.48-cm (12-in) walkway width, or no gait deviations but unable to achieve a significant change in velocity, or uses an assistive device. (1) Moderate impairment-Makes only minor adjustments to walking speed, or accomplishes a change in speed with significant gait deviations, deviates 25.4-38.1 cm (10-15 in) outside the 30.48-cm (12-in) walkway width, or changes speed but loses  balance but is able to recover and continue walking. (0) Severe impairment-Cannot change speeds, deviates greater than 38.1 cm (15 in) outside 30.48-cm (12-in) walkway width, or loses balance and has to reach for wall or be caught.  _3_ 3. GAIT WITH  HORIZONTAL HEAD TURNS Instructions: Walk from here to the next mark 6 m (20 ft) away. Begin walking at your normal pace. Keep walking straight; after 3 steps, turn your head to the right and keep walking straight while looking to the right. After 3 more steps, turn your head to the left and keep walking straight while looking left. Continue alternating looking right and left every 3 steps until you have completed 2 repetitions in each direction. Grading: [place] the highest category that applies. (3) Normal-Performs head turns smoothly with no change in gait. Deviates no more than 15.24 cm (6 in) outside 30.48-cm (12-in) walkway width. (2) Mild impairment-Performs head turns smoothly with slight change in gait velocity (eg, minor disruption to smooth gait path), deviates 15.24-25.4 cm (6-10 in) outside 30.48-cm (12-in) walkway width, or uses an assistive device.  (1) Moderate impairment-Performs head turns with moderate change in gait velocity, slows down, deviates 25.4-38.1 cm (10-15 in) outside 30.48-cm (12-in) walkway width but recovers, can continue to walk. (0) Severe impairment-Performs task with severe disruption of gait (eg, staggers 38.1 cm [15 in] outside 30.48-cm (12-in) walkway width, loses balance, stops, or reaches for wall).  _3_ 4. GAIT WITH VERTICAL HEAD TURNS Instructions: Walk from here to the next mark (6 m [20 ft]). Begin walking at your normal pace. Keep walking straight; after 3 steps, tip your head up and keep walking straight while looking up. After 3 more steps, tip your head down, keep walking straight while looking down. Continue alternating looking up and down every 3 steps until you have completed 2 repetitions in each direction. Grading: [place] the highest category that applies. (3) Normal-Performs head turns with no change in gait. Deviates no more than 15.24 cm (6 in) outside 30.48-cm (12-in) walkway width. (2) Mild impairment-Performs task with slight change in gait velocity  (eg, minor disruption to smooth gait path), deviates 15.24-25.4 cm (6-10 in) outside 30.48-cm (12-in) walkway width or uses assistive device. (1) Moderate impairment-Performs task with moderate change in gait velocity, slows down, deviates 25.4-38.1 cm (10-15 in) outside 30.48-cm (12-in) walkway width but recovers, can continue to walk. (0) Severe impairment-Performs task with severe disruption of gait (eg, staggers 38.1 cm [15 in] outside 30.48-cm (12-in) walkway width, loses balance, stops, reaches for wall).  _3_ 5. GAIT AND PIVOT TURN Instructions: Begin with walking at your normal pace. When I tell you, "turn and stop," turn as quickly as you can to face the opposite direction and stop. Grading: [place] the highest category that applies. (3) Normal-Pivot turns safely within 3 seconds and stops quickly with no loss of balance. (2) Mild impairment-Pivot turns safely in _3 seconds and stops with no loss of balance, or pivot turns safely within 3 seconds and stops with mild imbalance, requires small steps to catch balance. (1) Moderate impairment-Turns slowly, requires verbal cueing, or requires several small steps to catch balance following turn and stop. (0) Severe impairment-Cannot turn safely, requires assistance to turn and stop.  _3_ 6. STEP OVER OBSTACLE Instructions: Begin walking at your normal speed. When you come to the shoe box, step over it, not around it, and keep walking. Grading: [place] the highest category that applies. (3) Normal-Is able to step over 2 stacked shoe boxes taped  together (22.86 cm [9 in] total height) without changing gait speed; no evidence of imbalance. (2) Mild impairment-Is able to step over one shoe box (11.43 cm [4.5 in] total height) without changing gait speed; no evidence of imbalance. (1) Moderate impairment-Is able to step over one shoe box (11.43 cm [4.5 in] total height) but must slow down and adjust steps to clear box safely. May require verbal  cueing. (0) Severe impairment-Cannot perform without assistance.  _1_ 7. GAIT WITH NARROW BASE OF SUPPORT Instructions: Walk on the floor with arms folded across the chest, feet aligned heel to toe in tandem for a distance of 3.6 m [12 ft]. The number of steps taken in a straight line are counted for a maximum of 10 steps. Grading: [place] the highest category that applies. (3) Normal-Is able to ambulate for 10 steps heel to toe with no staggering. (2) Mild impairment-Ambulates 7-9 steps. (1) Moderate impairment-Ambulates 4-7 steps. (0) Severe impairment-Ambulates less than 4 steps heel to toe or cannot perform without assistance.  _1_ 8. GAIT WITH EYES CLOSED Instructions: Walk at your normal speed from here to the next mark (6 m [20 ft]) with your eyes closed. Grading: [place] the highest category that applies. (3) Normal-Walks 6 m (20 ft), no assistive devices, good speed, no evidence of imbalance, normal gait pattern, deviates no more than 15.24 cm (6 in) outside 30.48-cm (12-in) walkway width. Ambulates 6 m (20 ft) in less than 7 seconds. (2) Mild impairment-Walks 6 m (20 ft), uses assistive device, slower speed, mild gait deviations, deviates 15.24-25.4 cm (6-10 in) outside 30.48-cm (12-in) walkway width. Ambulates 6 m (20 ft) in less than 9 seconds but greater than 7 seconds. (1) Moderate impairment-Walks 6 m (20 ft), slow speed, abnormal gait pattern, evidence for imbalance, deviates 25.4-38.1 cm (10-15 in) outside 30.48-cm (12-in) walkway width. Requires more than 9 seconds to ambulate 6 m (20 ft). (0) Severe impairment-Cannot walk 6 m (20 ft) without assistance, severe gait deviations or imbalance, deviates greater than 38.1 cm (15 in) outside 30.48-cm (12-in) walkway width or will not attempt task.  _2_ 9. AMBULATING BACKWARDS Instructions: Walk backwards until I tell you to stop. Grading: [place] the highest category that applies. (3) Normal-Walks 6 m (20 ft), no assistive devices, good  speed, no evidence for imbalance, normal gait pattern, deviates no more than 15.24 cm (6 in) outside 30.48-cm (12-in) walkway width. (2) Mild impairment-Walks 6 m (20 ft), uses assistive device, slower speed, mild gait deviations, deviates 15.24-25.4 cm (6-10 in) outside 30.48-cm (12-in) walkway width. (1) Moderate impairment-Walks 6 m (20 ft), slow speed, abnormal gait pattern, evidence for imbalance, deviates 25.4-38.1 cm (10-15 in) outside 30.48-cm (12-in) walkway width. (0) Severe impairment-Cannot walk 6 m (20 ft) without assistance, severe gait deviations or imbalance, deviates greater than 38.1 cm (15 in) outside 30.48-cm (12-in) walkway width or will not attempt task.  _2_ 10. STEPS Instructions: Walk up these stairs as you would at home (ie, using the rail if necessary). At the top turn around and walk down. Grading: [place] the highest category that applies. (3) Normal-Alternating feet, no rail. (2) Mild impairment-Alternating feet, must use rail. (1) Moderate impairment-Two feet to a stair; must use rail. (0) Severe impairment-Cannot do safely.  TOTAL SCORE: ___23___ /30 (MAXIMUM SCORE=30)  Scores of ? 22/30 on the FGA were found to be effective in predicting falls, Sensitivity 85%, Specificity 86% Scores of ? 20/30 on the FGA were optimal to predict older adults residing in community dwellings who  would sustain unexplained falls in the next 6 months, Sensitivity 100%, Specificity 76% Ernestine Conrad & Dwyane Dee, 2010; aged 42 to 54, Older Adults) [place]: 4.2 points for CVA Augustin Coupe et al, 2010) [facility]: 8 points for Balance and Vestibular Disorders Marjorie Smolder and Augustin Coupe, 2014)  Therapy Documentation Precautions:  Precautions Precautions: Fall Precaution Comments: Rt inattention  Restrictions Weight Bearing Restrictions: No Pain: Pain Assessment Pain Assessment: No/denies pain  See Function Navigator for Current Functional Status.   Therapy/Group: Individual Therapy  Laretta Alstrom 12/22/2015, 12:07 PM

## 2015-12-22 NOTE — Progress Notes (Signed)
Occupational Therapy Session Note  Patient Details  Name: James Cortez MRN: [id number] Date of Birth: 03/08/1965  Today's Date: 12/22/2015 OT Individual Time: 1000-1045 and 1315-1415 OT Individual Time Calculation (min): 45 min and 60 min    Short Term Goals: Week 1:  OT Short Term Goal 1 (Week 1): LTG= STG 2/2 estimated short LOS  Skilled Therapeutic Interventions/Progress Updates:    1) Treatment session with focus on ADL retraining and dynamic standing balance. Pt completed bathing and dressing at overall distant supervision - Mod I level at sit > stand level.  Discussed possibility of completing bathing at standing level during next session as well as discussion regarding various DME to increase safety in shower when bathing.  Pt completed simulated walk-in shower transfer over 3" ledge without assist.  Engaged in dynamic standing balance on [employer] with focus on weight shifting with pt demonstrating difficulty weight shifting forward as well as decreased balance reactions without UE support.  2) Treatment session with focus on dynamic standing balance and LUE NMR.  Engaged in dynamic standing balance activity with use of Wii balance board.  Pt completed various balance activities without UE support with weight shifting forward, backward, Lt, and Rt without LOB or use of stepping strategies.  Discussed functional carryover of weight shifting with ADLs and IADLs.  Engaged in [facility] with LUE with focus on picking up coins, in-hand manipulation and translation to place coins into slot requiring increased fine motor control.  Encouraged pt to continue to focus on [facility] with coins during down time and even stacking coins with pt reporting understanding.  Therapy Documentation Precautions:  Precautions Precautions: Fall Precaution Comments: Rt inattention  Restrictions Weight Bearing Restrictions: No General:   Vital Signs:   Pain:  Pt with no c/o pain  See Function Navigator for Current  Functional Status.   Therapy/Group: Individual Therapy  [name] 12/22/2015, 5:54 PM

## 2015-12-23 ENCOUNTER — Ambulatory Visit (HOSPITAL_COMMUNITY): Payer: Self-pay | Admitting: Speech Pathology

## 2015-12-23 ENCOUNTER — Ambulatory Visit: Payer: Self-pay

## 2015-12-23 ENCOUNTER — Inpatient Hospital Stay (HOSPITAL_COMMUNITY): Payer: Self-pay | Admitting: Physical Therapy

## 2015-12-23 ENCOUNTER — Inpatient Hospital Stay (HOSPITAL_COMMUNITY): Payer: Self-pay | Admitting: Speech Pathology

## 2015-12-23 ENCOUNTER — Inpatient Hospital Stay (HOSPITAL_COMMUNITY): Payer: Self-pay | Admitting: Occupational Therapy

## 2015-12-23 NOTE — Progress Notes (Signed)
Speech Language Pathology Daily Session Note  Patient Details  Name: James Cortez MRN: [id number] Date of Birth: [birth year]  Today's Date: 12/23/2015 SLP Individual Time: 1100-1200 SLP Individual Time Calculation (min): 60 min   Short Term Goals: Week 1: SLP Short Term Goal 1 (Week 1): Patient will maintain selective attention for 30 minutes given supervision verbal cues in a minimally distracting environment.  SLP Short Term Goal 2 (Week 1): Patient will demonstrate basic, functional problem-solving given supervision verbal cues.  SLP Short Term Goal 3 (Week 1): Given Mod A multimodal phonemic, written, and visual cues, patient will communicate basic wants/needs at the phrase level.  SLP Short Term Goal 4 (Week 1): Patient will follow basic 2-step commands given Min A multimodal cues. SLP Short Term Goal 5 (Week 1): Patient will display no overt s/s of aspiration on current diet with Mod A verbal cues for use of swallowing compensatory strategies.  SLP Short Term Goal 6 (Week 1): Patient will consume trials of thin liquids with minimal overt s/s of aspiration across 2 sessions given Mod A verbal cues prior to follow-up MBS.   Skilled Therapeutic Interventions:Skilled SLP services with focus on linguistic goals. Pt continues to have episodes of anomia and semantic and phonemic paraphasias which complicate interpreting services. Pt was able to identify deficits of language and R upper extremity and discuss safety concerns with min verbal cueing. Pt dismissed the idea of 24/7 supervision. He demonstrated the ability to dial a telephone with min A, but will need a posted list of phone numbers. Pt was agreeable that he needs assist with meds/finances. Basic coin counting completed with 50% acc. 2-step directives followed with 80% acc.   Function:  Eating Eating                 Cognition Comprehension Comprehension assist level: Understands basic 75 - 89% of the time/ requires cueing 10 -  24% of the time  Expression   Expression assist level: Expresses basic 50 - 74% of the time/requires cueing 25 - 49% of the time. Needs to repeat parts of sentences.  Social Interaction Social Interaction assist level: Interacts appropriately with others with medication or extra time (anti-anxiety, antidepressant).  Problem Solving Problem solving assist level: Solves basic 75 - 89% of the time/requires cueing 10 - 24% of the time  Memory Memory assist level: Recognizes or recalls 50 - 74% of the time/requires cueing 25 - 49% of the time    Pain Pain Assessment Pain Assessment: No/denies pain  Therapy/Group: Individual Therapy  Vinetta Bergamo MA, CCC-SLP 12/23/2015, 4:46 PM

## 2015-12-23 NOTE — Progress Notes (Signed)
Occupational Therapy Session Note  Patient Details  Name: James Cortez MRN: [id number] Date of Birth: [birth year]  Today's Date: 12/23/2015 OT Individual Time: 0905-1000 and 1300-1345 OT Individual Time Calculation (min): 55 min and 45 min    Short Term Goals: Week 1:  OT Short Term Goal 1 (Week 1): LTG= STG 2/2 estimated short LOS  Skilled Therapeutic Interventions/Progress Updates:    1) Treatment session with focus on ADL retraining, Rt attention, and awareness of deficits.  Pt ambulated around room without AD to gather clothing for shower.  Completed bathing in standing with intermittent supervision for safety in standing.  Pt with good awareness of balance with use of grab bars when in single leg stance to wash lower legs and feet.  Donned underwear and pants at sit <> stand level from toilet seat.  Ambulated to therapy gym with supervision, pt bumped into computer in hallway on Rt with decreased awareness.  Ambulated in hallway to locate discs in numerical order with focus on visual scanning and increased Rt inattention.  Increased challenge to locating items in gym, with pt demonstrating increased difficulty due to high contrast and more distracting environment.  Noted pt to demonstrate decreased scanning to Rt and more focal attention in more distracting environment.  Discussed observations and related them to functional context.  Upon return to room, pt ambulated around bed while holding cup of coins, when turning to sit in chair pt dropped coins due to decreased sustained attention and Rt inattention.   2) Treatment session with focus on sequencing, awareness, and dynamic standing balance.  Ambulated to ADL apt with supervision, engaged in "scavenger hunt" with pt locating various household items in kitchen.  Pt required multiple cues to recall items in sets of 2's as well as descriptive terms approx 50% of time to locate items correctly.  Discussed safety concerns with cooking, with  recommendation for pt to not cook but have other family/friends do the cooking.  Dynamic standing balance on dyna disc while engaging in RUE gross and fine motor control with Connect 4.  Incorporated weight shifting and trunk control with reaching across midline with RUE.  Incorporated cognitive challenge as well with Connect 4.  Educated on functional carryover with sequencing and awareness.  Therapy Documentation Precautions:  Precautions Precautions: Fall Precaution Comments: Rt inattention  Restrictions Weight Bearing Restrictions: No  Pain:  Pt with no c/o pain  See Function Navigator for Current Functional Status.   Therapy/Group: Individual Therapy  [name] 12/23/2015, 12:14 PM

## 2015-12-23 NOTE — Progress Notes (Signed)
Subjective/Complaints: States he worked in [employer], [employer]-  No CP, SOB N/V/D Objective: Vital Signs: Blood pressure (!) 107/58, pulse 63, temperature 98 F (36.7 C), temperature source Oral, resp. rate 18, height '5\' 7"'$  (1.702 m), weight 69.7 kg (153 lb 10.6 oz), SpO2 99 %. Dg Swallowing Func-speech Pathology  Result Date: 12/22/2015 Objective Swallowing Evaluation: Type of Study: MBS-Modified Barium Swallow Study Patient Details Name: James Cortez MRN: [id number] Date of Birth: 1965/12/17 Today's Date: 12/22/2015 Time: SLP Start Time: 0917-SLP Stop Time: 0940 SLP Time Calculation (min): 23 min Past Medical History: Past Medical History: Diagnosis Date . Skin cancer  Past Surgical History: Past Surgical History: Procedure Laterality Date . EP IMPLANTABLE DEVICE N/A 12/15/2015  Procedure: Loop Recorder Insertion;  Surgeon: Will Meredith Leeds, MD;  Location: [facility];  Service: Cardiovascular;  Laterality: N/A; . IR GENERIC HISTORICAL  12/08/2015  IR PERCUTANEOUS ART THROMBECTOMY/INFUSION INTRACRANIAL INC DIAG ANGIO 12/08/2015 Luanne Bras, MD [facility] . RADIOLOGY WITH ANESTHESIA Right 12/08/2015  Procedure: RADIOLOGY WITH ANESTHESIA - CODE STROKE;  Surgeon: Medication Radiologist, MD;  Location: [facility];  Service: Radiology;  Laterality: Right; . SKIN SURGERY   . TEE WITHOUT CARDIOVERSION N/A 12/15/2015  Procedure: TRANSESOPHAGEAL ECHOCARDIOGRAM (TEE);  Surgeon: Sueanne Margarita, MD;  Location: [facility];  Service: Cardiovascular;  Laterality: N/A; HPI: Mr.James Floresis a 50 y.o.malewith no significant medical history presenting with right facial droop, right hemiparesis and difficulty with speech. HereceivedIV t-PA 12/08/2015 at 1822. Revascularization of L M2 with solitaire, stent assisted angioplasty and IA tPA with reocclusion leading to a TICI 2b revascularization.  Initiated on a Dys 1, nectar thick liquids diet per initial MBS due to silent aspiration of thin liquids.   Repeat objective assessment ordered today to determine readiness for advancement.    Assessment / Plan / Recommendation CHL IP CLINICAL IMPRESSIONS 12/22/2015 Therapy Diagnosis Mild pharyngeal phase dysphagia;Mild oral phase dysphagia Clinical Impression Pt presents with a mild oropharyngeal dysphagia with both sensory and motor deficits.  Pt's oral phase is improved but remains prolonged with decreased base of tongue retraction which lead to premature spillage of materials into the oropharynx (liquids > solids).  The abovementioned deficits in addition to sensory impairment lead to delay in swallow initiation.  Swallow response was triggered at the vallecula with smaller sips of thin liquids and thicker viscosities.  Response was most consistently triggered at the pyriforms with thin liquids as pt was noted to naturally take larger sips.  Despite delay in swallow initiation, pt was able to keep the airway clear of penetration or aspiration.  The only instance of airway invasion occurred with mixed pill and liquid consistencies which resulted in deep penetration to the level of the cords.  Penetration was sensed and appeared to clear most materials from the cords.  Recommend advancing pt's diet to dys 3 solids and thin liquids with full supervision for use of swallowing precautions: small bites/sips, no straws, slow rate, meds whole in puree.  Prognosis for continued advancement good with ongoing ST interventions for management of safe diet progression and to reinforce use of swallowing precautions.   Impact on safety and function Mild aspiration risk   CHL IP TREATMENT RECOMMENDATION 12/12/2015 Treatment Recommendations Therapy as outlined in treatment plan below   Prognosis 12/22/2015 Prognosis for Safe Diet Advancement Good Barriers to Reach Goals -- Barriers/Prognosis Comment -- CHL IP DIET RECOMMENDATION 12/22/2015 SLP Diet Recommendations Dysphagia 3 (Mech soft) solids;Thin liquid Liquid Administration via  Cup;No straw Medication Administration Whole meds  with puree Compensations Minimize environmental distractions;Slow rate;Small sips/bites Postural Changes Seated upright at 90 degrees            CHL IP ORAL PHASE 12/22/2015 Oral Phase Impaired Oral - Pudding Teaspoon -- Oral - Pudding Cup -- Oral - Honey Teaspoon -- Oral - Honey Cup -- Oral - Nectar Teaspoon -- Oral - Nectar Cup Piecemeal swallowing;Lingual/palatal residue;Decreased bolus cohesion;Premature spillage;Weak lingual manipulation Oral - Nectar Straw -- Oral - Thin Teaspoon -- Oral - Thin Cup Weak lingual manipulation;Piecemeal swallowing;Delayed oral transit;Decreased bolus cohesion;Premature spillage Oral - Thin Straw Weak lingual manipulation;Piecemeal swallowing;Decreased bolus cohesion;Premature spillage Oral - Puree Weak lingual manipulation;Delayed oral transit;Lingual/palatal residue Oral - Mech Soft -- Oral - Regular Impaired mastication;Delayed oral transit;Lingual/palatal residue Oral - Multi-Consistency -- Oral - Pill -- Oral Phase - Comment --  CHL IP PHARYNGEAL PHASE 12/22/2015 Pharyngeal Phase Impaired Pharyngeal- Pudding Teaspoon -- Pharyngeal -- Pharyngeal- Pudding Cup -- Pharyngeal -- Pharyngeal- Honey Teaspoon -- Pharyngeal -- Pharyngeal- Honey Cup -- Pharyngeal -- Pharyngeal- Nectar Teaspoon -- Pharyngeal -- Pharyngeal- Nectar Cup Delayed swallow initiation-vallecula;Delayed swallow initiation-pyriform sinuses;Reduced tongue base retraction;Pharyngeal residue - valleculae;Reduced laryngeal elevation Pharyngeal -- Pharyngeal- Nectar Straw -- Pharyngeal -- Pharyngeal- Thin Teaspoon -- Pharyngeal -- Pharyngeal- Thin Cup Delayed swallow initiation-pyriform sinuses;Reduced laryngeal elevation;Reduced tongue base retraction;Pharyngeal residue - valleculae Pharyngeal -- Pharyngeal- Thin Straw Delayed swallow initiation-pyriform sinuses;Reduced laryngeal elevation;Reduced tongue base retraction;Pharyngeal residue - valleculae;Reduced  airway/laryngeal closure;Penetration/Aspiration during swallow Pharyngeal Material enters airway, CONTACTS cords and then ejected out Pharyngeal- Puree Delayed swallow initiation-vallecula;Pharyngeal residue - valleculae;Reduced tongue base retraction Pharyngeal -- Pharyngeal- Mechanical Soft -- Pharyngeal -- Pharyngeal- Regular Delayed swallow initiation-vallecula;Reduced tongue base retraction;Pharyngeal residue - valleculae Pharyngeal -- Pharyngeal- Multi-consistency -- Pharyngeal -- Pharyngeal- Pill -- Pharyngeal -- Pharyngeal Comment --  CHL IP CERVICAL ESOPHAGEAL PHASE 12/22/2015 Cervical Esophageal Phase WFL Pudding Teaspoon -- Pudding Cup -- Honey Teaspoon -- Honey Cup -- Nectar Teaspoon -- Nectar Cup -- Nectar Straw -- Thin Teaspoon -- Thin Cup -- Thin Straw -- Puree -- Mechanical Soft -- Regular -- Multi-consistency -- Pill -- Cervical Esophageal Comment -- No flowsheet data found. Page, Selinda Orion 12/22/2015, 9:54 AM              Results for orders placed or performed during the hospital encounter of 12/15/15 (from the past 72 hour(s))  Creatinine, serum     Status: None   Collection Time: 12/22/15  5:26 AM  Result Value Ref Range   Creatinine, Ser 0.91 0.61 - 1.24 mg/dL   GFR calc non Af Amer >60 >60 mL/min   GFR calc Af Amer >60 >60 mL/min    Comment: (NOTE) The eGFR has been calculated using the CKD EPI equation. This calculation has not been validated in all clinical situations. eGFR's persistently <60 mL/min signify possible Chronic Kidney Disease.       General: No acute distress Mood and affect are appropriate Heart: Regular rate and rhythm no rubs murmurs or extra sounds, loop recorder site, bruising , drsg removed, no erythema or drainage, steristrips intact Lungs: Clear to auscultation, breathing unlabored, no rales or wheezes Abdomen: Positive bowel sounds, soft nontender to palpation, nondistended, ecchymosis LLQ Extremities: No clubbing, cyanosis, or edema, IV right  forearm Skin: No evidence of breakdown, no evidence of rash Neurologic: Cranial nerves II through XII intact, motor strength is 5/5 in Left  deltoid, bicep, tricep, grip, hip flexor, knee extensors, ankle dorsiflexor and plantar flexor 4+/5 on RIght side Sensory exam normal sensation to light touch and  proprioception in bilateral upper and lower extremities  Cerebellar exam reduced finger to nose to finger on RIght     Musculoskeletal: Full range of motion in all 4 extremities. No joint swelling   Assessment/Plan: 1. Functional deficits secondary to Left MCA infarct which require 3+ hours per day of interdisciplinary therapy in a comprehensive inpatient rehab setting. Physiatrist is providing close team supervision and 24 hour management of active medical problems listed below. Physiatrist and rehab team continue to assess barriers to discharge/monitor patient progress toward functional and medical goals. FIM: Function - Bathing Position: Shower Body parts bathed by patient: Right arm, Left arm, Chest, Abdomen, Front perineal area, Buttocks, Right upper leg, Left upper leg, Right lower leg, Left lower leg Bathing not applicable: Back Assist Level: Set up Set up : To obtain items  Function- Upper Body Dressing/Undressing What is the patient wearing?: Pull over shirt/dress Pull over shirt/dress - Perfomed by patient: Thread/unthread right sleeve, Thread/unthread left sleeve, Put head through opening, Pull shirt over trunk Assist Level: More than reasonable time Function - Lower Body Dressing/Undressing What is the patient wearing?: Underwear, Pants, Non-skid slipper socks Position:  (seated on toilet) Underwear - Performed by patient: Thread/unthread right underwear leg, Thread/unthread left underwear leg, Pull underwear up/down Pants- Performed by patient: Thread/unthread right pants leg, Thread/unthread left pants leg, Pull pants up/down Non-skid slipper socks- Performed by patient:  Don/doff right sock, Don/doff left sock Socks - Performed by patient: Don/doff right sock, Don/doff left sock Socks - Performed by helper: Don/doff right sock, Don/doff left sock Assist for footwear: Setup Assist for lower body dressing: Supervision or verbal cues  Function - Toileting Toileting steps completed by patient: Adjust clothing prior to toileting, Performs perineal hygiene, Adjust clothing after toileting Toileting steps completed by helper: Adjust clothing after toileting Toileting Assistive Devices: Grab bar or rail Assist level: Supervision or verbal cues  Function - Air cabin crew transfer assistive device: Grab bar Assist level to toilet: No Help, no cues, assistive device, takes more than a reasonable amount of time Assist level from toilet: No Help, no cues, assistive device, takes more than a reasonable amount of time  Function - Chair/bed transfer Chair/bed transfer method: Ambulatory Chair/bed transfer assist level: Supervision or verbal cues Chair/bed transfer assistive device: Armrests Chair/bed transfer details: Verbal cues for precautions/safety  Function - Locomotion: Wheelchair Will patient use wheelchair at discharge?: No Wheelchair activity did not occur: N/A (pt ambulatory on unit) Function - Locomotion: Ambulation Assistive device: No device Max distance: 1000 ft Assist level: Supervision or verbal cues Assist level: Supervision or verbal cues Assist level: Supervision or verbal cues Assist level: Supervision or verbal cues Assist level: Supervision or verbal cues  Function - Comprehension Comprehension: Auditory Comprehension assist level: Follows basic conversation/direction with extra time/assistive device  Function - Expression Expression: Verbal Expression assistive device: Other (Comment) (spanish speaking) Expression assist level: Expresses basic 50 - 74% of the time/requires cueing 25 - 49% of the time. Needs to repeat parts of  sentences.  Function - Social Interaction Social Interaction assist level: Interacts appropriately 75 - 89% of the time - Needs redirection for appropriate language or to initiate interaction.  Function - Problem Solving Problem solving assist level: Solves basic 75 - 89% of the time/requires cueing 10 - 24% of the time  Function - Memory Memory assist level: Recognizes or recalls 25 - 49% of the time/requires cueing 50 - 75% of the time Patient normally able to recall (first 3 days only): That  he or she is in a hospital, Location of own room, Current season, Staff names and faces    Medical Problem List and Plan: 1.  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 Status post loop recorder-              -CIR PT, OT, SLP, Team conference today please see physician documentation under team conference tab, met with team face-to-face to discuss problems,progress, and goals. Formulized individual treatment plan based on medical history, underlying problem and comorbidities. 2.  DVT Prophylaxis/Anticoagulation: Subcutaneous Lovenox. Monitor platelet counts for any signs of bleeding, has large bruise LLQ monitor 3. Pain Management: Tylenol as needed 4. Aphasia/dysphagia.Dysphagia upgraded to D3 thin liquid. Follow-up speech therapy       5. Neuropsych: This patient is not capable of making decisions on his own behalf. 6. Skin/Wound Care: Routine skin checks 7. Fluids/Electrolytes/Nutrition: Routine I&O with follow-up chemistries upon admission tomorrow,100% meals 8.Tobacco abuse.Counseling  LOS (Days) 8 A FACE TO FACE EVALUATION WAS PERFORMED  KIRSTEINS,ANDREW E 12/23/2015, 7:54 AM

## 2015-12-23 NOTE — Progress Notes (Signed)
Speech Language Pathology Note  Patient Details  Name: James Cortez MRN: [id number] Date of Birth: 07/23/1965 Today's Date: 12/23/2015   MBSS complete 12/22/15. Full report located under chart review in imaging section.    Ena Demary, Selinda Orion 12/23/2015, 7:57 AM

## 2015-12-23 NOTE — Progress Notes (Signed)
Social Work Patient ID: James Cortez, male   DOB: 1965/03/11, 50 y.o.   MRN: [id number]  Met with pt along with SP-Kara and interpretor to inform of team conference goals supervision to mod/i and discharge 11/17. Have scheduled family education Friday with his girlfriend since she will be working Architectural technologist. Pt is very pleased with his progress and discharge date. Will make home health arrangements. No equipment needs.

## 2015-12-23 NOTE — Patient Care Conference (Signed)
Inpatient RehabilitationTeam Conference and Plan of Care Update Date: 12/23/2015   Time: 11:10 AM    Patient Name: James Cortez      Medical Record Number: [id number]  Date of Birth: 20-Oct-1965 Sex: Male         Room/Bed: 4W22C/4W22C-01 Payor Info: Payor: MEDICAID POTENTIAL / Plan: MEDICAID POTENTIAL / Product Type: *No Product type* /    Admitting Diagnosis: Stroke  Admit Date/Time:  12/15/2015  6:01 PM Admission Comments: No comment available   Primary Diagnosis:  Cerebral infarction due to thrombosis of left middle cerebral artery (HCC) Principal Problem: Cerebral infarction due to thrombosis of left middle cerebral artery [facility])  Patient Active Problem List   Diagnosis Date Noted  . Cerebral infarction due to thrombosis of left middle cerebral artery ([place]) 12/15/2015  . Left middle cerebral artery stroke ([place]) 12/15/2015  . Oropharyngeal dysphagia   . Aphasia as late effect of stroke   . Right hemiplegia ([place])   . Acute on chronic respiratory failure ([place])   . Essential hypertension   . CVA (cerebral vascular accident) ([place]) 12/08/2015    Expected Discharge Date: Expected Discharge Date: 12/25/15  Team Members Present: Physician leading conference: Dr. Alysia Penna Social Worker Present: Ovidio Kin, LCSW Nurse Present: Heather Roberts, RN PT Present: Jorge Mandril, PT OT Present: [name], OT SLP Present: Windell Moulding, SLP PPS Coordinator present : Daiva Nakayama, RN, CRRN     Current Status/Progress Goal Weekly Team Focus  Medical   aphasia improving, Right side weakness improving  home mod I goals  monitor on upgraded diet   Bowel/Bladder   continent of bowel and bladder  continue to be independent bowel and bladder function.  maintain independent B/B   Swallow/Nutrition/ Hydration   Advanced to dys 3, thin liquids   supervision, upgraded  toleration of upgrade and use of swallowing precautions    ADL's   Supervision  Mod I overall  awareness, Rt attention, d/c  planning   Mobility   supervision  mod I  progress high level balance and gait challenges, cognitive challenge focus on problem solving/safety awareness/path finding   Communication   min-mod assist   supervision for comprehension, mod assist for verbal expression   completion of family education prior to discharge home.     Safety/Cognition/ Behavioral Observations  min assist   supervision   awareness of verbal errors, safety awareness, selective attention tasks   Pain   no c/o pain  pt will be free from pain/have optimal pain management.  No complaints of pain.   Skin   skin is CDI  no new skin breakdown  maintain skin integrity      *See Care Plan and progress notes for long and short-term goals.  Barriers to Discharge: still has fall risk    Possible Resolutions to Barriers:  family training    Discharge Planning/Teaching Needs:  Home with his girlfriend and children-family and friedns can provide supervision level-may be an hour or two alone      Team Discussion:  Pt continuing to progress toward his goals-mod/i-supervision. Will need supervision for cognitive deficits. R-inattention better and awareness better. Diet upgraded to Dys 3 thin liquids. Family education with girlfriend prior to discharge.  Revisions to Treatment Plan:  DC Friday   Continued Need for Acute Rehabilitation Level of Care: The patient requires daily medical management by a physician with specialized training in physical medicine and rehabilitation for the following conditions: Daily direction of a multidisciplinary physical rehabilitation program to  ensure safe treatment while eliciting the highest outcome that is of practical value to the patient.: Yes Daily medical management of patient stability for increased activity during participation in an intensive rehabilitation regime.: Yes Daily analysis of laboratory values and/or radiology reports with any subsequent need for medication adjustment of  medical intervention for : Neurological problems  Serenidy Waltz, Gardiner Rhyme 12/23/2015, 12:40 PM

## 2015-12-23 NOTE — Progress Notes (Signed)
Physical Therapy Session Note  Patient Details  Name: James Cortez MRN: [id number] Date of Birth: [birth year]  Today's Date: 12/23/2015 PT Individual Time: 2820-6015 PT Individual Time Calculation (min): 45 min    Short Term Goals:Week 1:  PT Short Term Goal 1 (Week 1): =LTGs due to ELOS  Skilled Therapeutic Interventions/Progress Updates:   Patient received sitting in recliner.   PT instructed pt in ambulatory search for locations in hospital including gift shop, main entrance, and to return to  4 [facility] wing of hospital. PT required to provide mod cues for use of signs in hospital and increased visual scanning to the R to find locations on signs and maps throughout hospital.    Money management and problem solving in gift shop to stay within budget. Min cues when comparing items of different prices.   Standing balance on airex with narrow BOS to toss ball against trampoline 2x 1 minutes.    Vertical and horizontal body blade on airx pad with narrow BOS 2x 45 seconds each direction.  Min -supervision Assist provided with balance training on airex.   Patient returned to room and left sitting in reliner with call bell in reach and all needs met.    Therapy Documentation Precautions:  Precautions Precautions: Fall Precaution Comments: Rt inattention  Restrictions Weight Bearing Restrictions: No General:   Vital Signs:  Pain: Pain Assessment Pain Assessment: No/denies pain   See Function Navigator for Current Functional Status.   Therapy/Group: Individual Therapy  Lorie Phenix 12/23/2015, 2:57 PM

## 2015-12-24 ENCOUNTER — Inpatient Hospital Stay (HOSPITAL_COMMUNITY): Payer: Self-pay | Admitting: Physical Therapy

## 2015-12-24 ENCOUNTER — Inpatient Hospital Stay (HOSPITAL_COMMUNITY): Payer: Self-pay | Admitting: Occupational Therapy

## 2015-12-24 ENCOUNTER — Encounter: Payer: Self-pay | Admitting: Cardiology

## 2015-12-24 ENCOUNTER — Inpatient Hospital Stay (HOSPITAL_COMMUNITY): Payer: Self-pay | Admitting: Speech Pathology

## 2015-12-24 NOTE — Progress Notes (Signed)
Occupational Therapy Session Note  Patient Details  Name: James Cortez MRN: [id number] Date of Birth: 02/13/1965  Today's Date: 12/24/2015 OT Individual Time: 1105-1200 OT Individual Time Calculation (min): 55 min     Short Term Goals: Week 1:  OT Short Term Goal 1 (Week 1): LTG= STG 2/2 estimated short LOS  Skilled Therapeutic Interventions/Progress Updates:    Completed ADL retraining at overall Mod I level.  Pt retrieved all items and completed bathing at standing level in room shower without LOB.  Utilized toilet seat to complete dressing at sit <> stand level.  Pt donned shoes this session without assist.  Completed grooming tasks in standing without assistance.  Engaged in [place] activity in [place] with focus on dynamic standing balance, stooping to retrieve items and overall activity tolerance.  Pt demonstrated good balance reactions with reaching outside BOS to set up bowling pins and able to roll ball to knock over pins or to roll ball to another pt.  Pt appeared to enjoy activity and interaction with other patients as evidenced by smiling and laughing.  Returned to room and asked pt if he had any questions/concerns regarding d/c to which he reported having none.  Reiterated recommendation for supervision due to cognitive impairments and not recommending pt complete any cooking tasks to which he verbalized understanding.  Therapy Documentation Precautions:  Precautions Precautions: Fall Precaution Comments: Rt inattention  Restrictions Weight Bearing Restrictions: No General:   Vital Signs: Therapy Vitals Temp: 97.6 F (36.4 C) Temp Source: Oral Pulse Rate: 72 Resp: 18 BP: 112/68 Patient Position (if appropriate): Sitting Oxygen Therapy SpO2: 98 % O2 Device: Not Delivered Pain: Pain Assessment Pain Assessment: No/denies pain  See Function Navigator for Current Functional Status.   Therapy/Group: Individual Therapy  [name] 12/24/2015, 3:11 PM

## 2015-12-24 NOTE — Plan of Care (Signed)
Problem: RH BOWEL ELIMINATION Goal: RH STG MANAGE BOWEL WITH ASSISTANCE STG Manage Bowel with Assistance. Mod I  Outcome: Not Progressing LBM 12-21-15 Goal: RH STG MANAGE BOWEL W/MEDICATION W/ASSISTANCE STG Manage Bowel with Medication with min Assistance.   Outcome: Not Progressing LBM 12-21-15

## 2015-12-24 NOTE — Plan of Care (Signed)
Problem: RH Awareness Goal: LTG: Patient will demonstrate intellectual/emergent (PT) LTG: Patient will demonstrate intellectual/emergent/anticipatory awareness with assist during a mobility activity  (PT)  Outcome: Not Met (add Reason) Pt continues to require cues for anticipatory awareness.

## 2015-12-24 NOTE — Progress Notes (Signed)
Physical Therapy Discharge Summary  Patient Details  Name: James Cortez MRN: [id number] Date of Birth: 1965/11/21  Today's Date: 12/24/2015 PT Individual Time: 1330-1445 PT Individual Time Calculation (min): 75 min     Patient has met 10 of 11 long term goals due to improved activity tolerance, improved balance, improved postural control, increased strength, ability to compensate for deficits, functional use of  right upper extremity and right lower extremity, improved attention, improved awareness and improved coordination.  Patient to discharge at an ambulatory level Modified Independent.   Patient's care partner is independent to provide the necessary cognitive assistance at discharge.  Reasons goals not met: pt continues to require some level of assist for anticipatory awareness, thus recommending someone be with him 24/7 at home.    Recommendation:  Patient will benefit from ongoing skilled PT services in outpatient setting to continue to advance safe functional mobility, address ongoing impairments in awareness, and balance, and minimize fall risk.  Equipment: No equipment provided  Reasons for discharge: treatment goals met  Patient/family agrees with progress made and goals achieved: Yes   Skilled Therapeutic Intervention: Pt resting in recliner on arrival, no c/o pain and agreeable to therapy session.  Pt transfers and ambulates throughout session mod I.  Pt requires distant supervision on stairs for safety.  PT re administered BERG and FGA (see below for results.  Pt's results have improved in the last several days, further decreasing his risk for falls.  PT instructed pt in high level balance activities focus on weight shift and problem solving while playing Wii games.  Pt returned to room at end of session and positioned in recliner with call bell in reach and needs met.       PT Discharge Precautions/Restrictions   Vital Signs   PainPain Assessment Pain Assessment:  No/denies pain Vision/Perception     Cognition Overall Cognitive Status: Impaired/Different from baseline Arousal/Alertness: Awake/alert Orientation Level: Oriented X4 (cues for orientation to november) Attention: Alternating Focused Attention: Appears intact Sustained Attention: Appears intact Selective Attention: Appears intact Alternating Attention: Appears intact Awareness: Appears intact Awareness Impairment: Emergent impairment Sensation Sensation Light Touch: Appears Intact Proprioception: Appears Intact Coordination Gross Motor Movements are Fluid and Coordinated: Yes Fine Motor Movements are Fluid and Coordinated: Yes Finger Nose Finger Test: mildly impaired on R, but improved from evaluation Heel Shin Test: R=L Motor  Motor Motor: Within Functional Limits  Mobility Bed Mobility Supine to Sit: 7: Independent Sit to Supine: 7: Independent Transfers Transfers: Yes Sit to Stand: 6: Modified independent (Device/Increase time) Stand to Sit: 6: Modified independent (Device/Increase time) Locomotion  Ambulation Ambulation: Yes Ambulation/Gait Assistance: 6: Modified independent (Device/Increase time) Ambulation Distance (Feet): 250 Feet Assistive device: None Gait Gait: Yes Gait Pattern: Step-through pattern;Narrow base of support Stairs / Additional Locomotion Stairs: Yes Stairs Assistance: 5: Supervision Stair Management Technique: No rails Number of Stairs: 12 Height of Stairs: 6 Ramp: 6: Modified independent (Device) Curb: 6: Modified independent (Device/increase time) Wheelchair Mobility Wheelchair Mobility: No  Trunk/Postural Assessment  Cervical Assessment Cervical Assessment: Within Functional Limits Thoracic Assessment Thoracic Assessment: Within Functional Limits Lumbar Assessment Lumbar Assessment: Within Functional Limits Postural Control Postural Control: Within Functional Limits Protective Responses: improved by d/c  Balance Standardized  Balance Assessment Standardized Balance Assessment: Berg Balance Test Berg Balance Test Sit to Stand: Able to stand without using hands and stabilize independently Standing Unsupported: Able to stand safely 2 minutes Sitting with Back Unsupported but Feet Supported on Floor or Stool: Able to sit safely and  securely 2 minutes Stand to Sit: Sits safely with minimal use of hands Transfers: Able to transfer safely, minor use of hands Standing Unsupported with Eyes Closed: Able to stand 10 seconds safely Standing Ubsupported with Feet Together: Able to place feet together independently and stand 1 minute safely From Standing, Reach Forward with Outstretched Arm: Can reach confidently >25 cm (10") From Standing Position, Pick up Object from Floor: Able to pick up shoe safely and easily From Standing Position, Turn to Look Behind Over each Shoulder: Looks behind from both sides and weight shifts well Turn 360 Degrees: Able to turn 360 degrees safely in 4 seconds or less Standing Unsupported, Alternately Place Feet on Step/Stool: Able to stand independently and safely and complete 8 steps in 20 seconds Standing Unsupported, One Foot in Front: Able to place foot tandem independently and hold 30 seconds Standing on One Leg: Able to lift leg independently and hold > 10 seconds Total Score: 56   Functional Gait Assessment (FGA) Requirements: A marked 6-m (20-ft) walkway that is marked with a 30.48-cm (12-in) width.  _3_ 1. GAIT LEVEL SURFACE Instructions: Walk at your normal speed from here to the next mark (6 m[20 ft]). Grading: Loraine Leriche the highest category that applies. (3) Normal-Walks 6 m (20 ft) in less than 5.5 seconds, no assistive devices, good speed, no evidence for imbalance, normal gait pattern, deviates no more than 15.24 cm (6 in) outside of the 30.48-cm (12-in) walkway width. (2) Mild impairment-Walks 6 m (20 ft) in less than 7 seconds but greater than 5.5 seconds, uses assistive device,  slower speed, mild gait deviations, or deviates 15.24-25.4 cm (6-10 in) outside of the 30.48-cm (12-in) walkway width. (1) Moderate impairment-Walks 6 m (20 ft), slow speed, abnormal gait pattern, evidence for imbalance, or deviates 25.4-38.1 cm (10-15 in) outside of the 30.48-cm (12-in) walkway width. Requires more than 7 seconds to ambulate 6 m (20 ft). (0) Severe impairment-Cannot walk 6 m (20 ft) without assistance,severe gait deviations or imbalance, deviates greater than 38.1 cm (15 in) outside of the 30.48-cm (12-in) walkway width or reaches and touches the wall.  _3_ 2. CHANGE IN GAIT SPEED Instructions: Begin walking at your normal pace (for 1.5 m [5 ft]). When I tell you "go," walk as fast as you can (for 1.5 m [5 ft]). When I tell you "slow," walk as slowly as you can (for 1.5 m [5 ft]). Grading: Loraine Leriche the highest category that applies. (3) Normal-Able to smoothly change walking speed without loss of balance or gait deviation. Shows a significant difference in walking speeds between normal, fast, and slow speeds. Deviates no more than 15.24 cm (6 in) outside of the 30.48-cm (12-in) walkway width. (2) Mild impairment-Is able to change speed but demonstrates mild gait deviations, deviates 15.24-25.4 cm (6-10 in) outside of the 30.48-cm (12-in) walkway width, or no gait deviations but unable to achieve a significant change in velocity, or uses an assistive device. (1) Moderate impairment-Makes only minor adjustments to walking speed, or accomplishes a change in speed with significant gait deviations, deviates 25.4-38.1 cm (10-15 in) outside the 30.48-cm (12-in) walkway width, or changes speed but loses balance but is able to recover and continue walking. (0) Severe impairment-Cannot change speeds, deviates greater than 38.1 cm (15 in) outside 30.48-cm (12-in) walkway width, or loses balance and has to reach for wall or be caught.  _3_ 3. GAIT WITH HORIZONTAL HEAD TURNS Instructions: Walk from  here to the next mark 6 m (20 ft) away. Begin  walking at your normal pace. Keep walking straight; after 3 steps, turn your head to the right and keep walking straight while looking to the right. After 3 more steps, turn your head to the left and keep walking straight while looking left. Continue alternating looking right and left every 3 steps until you have completed 2 repetitions in each direction. Grading: Elta [place] the highest category that applies. (3) Normal-Performs head turns smoothly with no change in gait. Deviates no more than 15.24 cm (6 in) outside 30.48-cm (12-in) walkway width. (2) Mild impairment-Performs head turns smoothly with slight change in gait velocity (eg, minor disruption to smooth gait path), deviates 15.24-25.4 cm (6-10 in) outside 30.48-cm (12-in) walkway width, or uses an assistive device.  (1) Moderate impairment-Performs head turns with moderate change in gait velocity, slows down, deviates 25.4-38.1 cm (10-15 in) outside 30.48-cm (12-in) walkway width but recovers, can continue to walk. (0) Severe impairment-Performs task with severe disruption of gait (eg, staggers 38.1 cm [15 in] outside 30.48-cm (12-in) walkway width, loses balance, stops, or reaches for wall).  _3_ 4. GAIT WITH VERTICAL HEAD TURNS Instructions: Walk from here to the next mark (6 m [20 ft]). Begin walking at your normal pace. Keep walking straight; after 3 steps, tip your head up and keep walking straight while looking up. After 3 more steps, tip your head down, keep walking straight while looking down. Continue alternating looking up and down every 3 steps until you have completed 2 repetitions in each direction. Grading: Elta [place] the highest category that applies. (3) Normal-Performs head turns with no change in gait. Deviates no more than 15.24 cm (6 in) outside 30.48-cm (12-in) walkway width. (2) Mild impairment-Performs task with slight change in gait velocity (eg, minor disruption to smooth gait path),  deviates 15.24-25.4 cm (6-10 in) outside 30.48-cm (12-in) walkway width or uses assistive device. (1) Moderate impairment-Performs task with moderate change in gait velocity, slows down, deviates 25.4-38.1 cm (10-15 in) outside 30.48-cm (12-in) walkway width but recovers, can continue to walk. (0) Severe impairment-Performs task with severe disruption of gait (eg, staggers 38.1 cm [15 in] outside 30.48-cm (12-in) walkway width, loses balance, stops, reaches for wall).  _2_ 5. GAIT AND PIVOT TURN Instructions: Begin with walking at your normal pace. When I tell you, "turn and stop," turn as quickly as you can to face the opposite direction and stop. Grading: Elta [place] the highest category that applies. (3) Normal-Pivot turns safely within 3 seconds and stops quickly with no loss of balance. (2) Mild impairment-Pivot turns safely in _3 seconds and stops with no loss of balance, or pivot turns safely within 3 seconds and stops with mild imbalance, requires small steps to catch balance. (1) Moderate impairment-Turns slowly, requires verbal cueing, or requires several small steps to catch balance following turn and stop. (0) Severe impairment-Cannot turn safely, requires assistance to turn and stop.  _3_ 6. STEP OVER OBSTACLE Instructions: Begin walking at your normal speed. When you come to the shoe box, step over it, not around it, and keep walking. Grading: [place]ta [place] the highest category that applies. (3) Normal-Is able to step over 2 stacked shoe boxes taped together (22.86 cm [9 in] total height) without changing gait speed; no evidence of imbalance. (2) Mild impairment-Is able to step over one shoe box (11.43 cm [4.5 in] total height) without changing gait speed; no evidence of imbalance. (1) Moderate impairment-Is able to step over one shoe box (11.43 cm [4.5 in] total height) but must slow down and adjust  steps to clear box safely. May require verbal cueing. (0) Severe impairment-Cannot perform without  assistance.  3__ 7. GAIT WITH NARROW BASE OF SUPPORT Instructions: Walk on the floor with arms folded across the chest, feet aligned heel to toe in tandem for a distance of 3.6 m [12 ft]. The number of steps taken in a straight line are counted for a maximum of 10 steps. Grading: Elta [place] the highest category that applies. (3) Normal-Is able to ambulate for 10 steps heel to toe with no staggering. (2) Mild impairment-Ambulates 7-9 steps. (1) Moderate impairment-Ambulates 4-7 steps. (0) Severe impairment-Ambulates less than 4 steps heel to toe or cannot perform without assistance.  _1_ 8. GAIT WITH EYES CLOSED Instructions: Walk at your normal speed from here to the next mark (6 m [20 ft]) with your eyes closed. Grading: Elta [place] the highest category that applies. (3) Normal-Walks 6 m (20 ft), no assistive devices, good speed, no evidence of imbalance, normal gait pattern, deviates no more than 15.24 cm (6 in) outside 30.48-cm (12-in) walkway width. Ambulates 6 m (20 ft) in less than 7 seconds. (2) Mild impairment-Walks 6 m (20 ft), uses assistive device, slower speed, mild gait deviations, deviates 15.24-25.4 cm (6-10 in) outside 30.48-cm (12-in) walkway width. Ambulates 6 m (20 ft) in less than 9 seconds but greater than 7 seconds. (1) Moderate impairment-Walks 6 m (20 ft), slow speed, abnormal gait pattern, evidence for imbalance, deviates 25.4-38.1 cm (10-15 in) outside 30.48-cm (12-in) walkway width. Requires more than 9 seconds to ambulate 6 m (20 ft). (0) Severe impairment-Cannot walk 6 m (20 ft) without assistance, severe gait deviations or imbalance, deviates greater than 38.1 cm (15 in) outside 30.48-cm (12-in) walkway width or will not attempt task.  _3_ 9. AMBULATING BACKWARDS Instructions: Walk backwards until I tell you to stop. Grading: [place] the highest category that applies. (3) Normal-Walks 6 m (20 ft), no assistive devices, good speed, no evidence for imbalance, normal gait pattern,  deviates no more than 15.24 cm (6 in) outside 30.48-cm (12-in) walkway width. (2) Mild impairment-Walks 6 m (20 ft), uses assistive device, slower speed, mild gait deviations, deviates 15.24-25.4 cm (6-10 in) outside 30.48-cm (12-in) walkway width. (1) Moderate impairment-Walks 6 m (20 ft), slow speed, abnormal gait pattern, evidence for imbalance, deviates 25.4-38.1 cm (10-15 in) outside 30.48-cm (12-in) walkway width. (0) Severe impairment-Cannot walk 6 m (20 ft) without assistance, severe gait deviations or imbalance, deviates greater than 38.1 cm (15 in) outside 30.48-cm (12-in) walkway width or will not attempt task.  _3_ 10. STEPS Instructions: Walk up these stairs as you would at home (ie, using the rail if necessary). At the top turn around and walk down. Grading: Elta [place] the highest category that applies. (3) Normal-Alternating feet, no rail. (2) Mild impairment-Alternating feet, must use rail. (1) Moderate impairment-Two feet to a stair; must use rail. (0) Severe impairment-Cannot do safely.  TOTAL SCORE: 27 /30 (MAXIMUM SCORE=30)  Scores of ? 22/30 on the FGA were found to be effective in predicting falls, Sensitivity 85%, Specificity 86% Scores of ? 20/30 on the FGA were optimal to predict older adults residing in community dwellings who would sustain unexplained falls in the next 6 months, Sensitivity 100%, Specificity 76% ([place], 2010; aged 65 to 65, Older Adults) [place]: 4.2 points for CVA Augustin Coupe et al, 2010) [facility]: 8 points for Balance and Vestibular Disorders Marjorie Smolder and Augustin Coupe, 2014)  Extremity Assessment      RLE Assessment RLE Assessment: Within Functional Limits RLE Strength  RLE Overall Strength Comments: 5/5 throughout LLE Assessment LLE Assessment: Within Functional Limits LLE Strength LLE Overall Strength Comments: 5/5 throughout   See Function Navigator for Current Functional Status.  Jasmon Mattice E Penven-Crew 12/24/2015, 2:06 PM

## 2015-12-24 NOTE — Discharge Instructions (Signed)
Inpatient Rehab Discharge Instructions  [facility] Discharge date and time: No discharge date for patient encounter.   Activities/Precautions/ Functional Status: Activity: activity as tolerated Diet: soft Wound Care: none needed Functional status:  ___ No restrictions     ___ Walk up steps independently ___ 24/7 supervision/assistance   ___ Walk up steps with assistance ___ Intermittent supervision/assistance  ___ Bathe/dress independently ___ Walk with walker     _x__ Bathe/dress with assistance ___ Walk Independently    ___ Shower independently ___ Walk with assistance    ___ Shower with assistance ___ No alcohol     ___ Return to work/school ________  Special Instructions: NO DRIVING/SMOKING    COMMUNITY REFERRALS UPON DISCHARGE:    Home Health:   PT, OT, SP, RN, [place]   Date of last service:12/25/2015  Medical Equipment/Items Ordered:NO NEEDS   Other:DISABILITY AND MEDICAID APPLICATIONS PENDING  GENERAL COMMUNITY RESOURCES FOR PATIENT/FAMILY: Support Groups:[facility] SUPPORT GROUP EVERY SECOND Thursday @ 3:00-4:00 PM ON THE [facility] UNIT QUESTIONS CONTACT KATIE  [phone]  Employment [place]                                                                                                                                           [address] [place] [address]                                                                                                                                            [phone]  STROKE/TIA DISCHARGE INSTRUCTIONS SMOKING Cigarette smoking nearly doubles your risk of having a stroke & is the single most alterable risk factor  If you smoke or have smoked in the last 12 months, you are advised to quit smoking for your health.  Most  of the excess cardiovascular risk related to smoking disappears within a year of stopping.  Ask you doctor about anti-smoking medications  [place] Quit Line: 1-800-QUIT NOW  Free Smoking Cessation Classes [phone]  CHOLESTEROL Know your levels; limit fat & cholesterol in your diet  Lipid Panel     Component Value Date/Time   CHOL 159 12/09/2015 0430   TRIG 170 (H) 12/10/2015 0500   HDL 24 (L) 12/09/2015 0430   CHOLHDL 6.6 12/09/2015 0430   VLDL 48 (H) 12/09/2015 0430   LDLCALC 87 12/09/2015 0430      Many patients benefit from treatment even if their cholesterol is at goal.  Goal: Total Cholesterol (CHOL) less than 160  Goal:  Triglycerides (TRIG) less than 150  Goal:  HDL greater than 40  Goal:  LDL (LDLCALC) less than 100   BLOOD PRESSURE American Stroke Association blood pressure target is less that 120/80 mm/Hg  Your discharge blood pressure is:  BP: (!) 109/58  Monitor your blood pressure  Limit your salt and alcohol intake  Many individuals will require more than one medication for high blood pressure  DIABETES (A1c is a blood sugar average for last 3 months) Goal HGBA1c is under 7% (HBGA1c is blood sugar average for last 3 months)  Diabetes: No known diagnosis of diabetes    Lab Results  Component Value Date   HGBA1C 5.6 12/09/2015     Your HGBA1c can be lowered with medications, healthy diet, and exercise.  Check your blood sugar as directed by your physician  Call your physician if you experience unexplained or low blood sugars.  PHYSICAL ACTIVITY/REHABILITATION Goal is 30 minutes at least 4 days per week  Activity: Increase activity slowly, Therapies: Physical Therapy: Home Health Return to work:   Activity decreases your risk of heart attack and stroke and makes your heart stronger.  It helps control your weight and blood pressure; helps you relax and can improve your mood.  Participate in a regular exercise program.  Talk with your doctor about  the best form of exercise for you (dancing, walking, swimming, cycling).  DIET/WEIGHT Goal is to maintain a healthy weight  Your discharge diet is: DIET DYS 3 Room service appropriate? Yes; Fluid consistency: Thin  liquids Your height is:  Height: 5\' 7"  (170.2 cm) Your current weight is: Weight: 70.1 kg (154 lb 8.7 oz) Your Body Mass Index (BMI) is:  BMI (Calculated): 25.8  Following the type of diet specifically designed for you will help prevent another stroke.  Your goal weight range is:    Your goal Body Mass Index (BMI) is 19-24.  Healthy food habits can help reduce 3 risk factors for stroke:  High cholesterol, hypertension, and excess weight.  RESOURCES Stroke/Support Group:  Call [phone]   STROKE EDUCATION PROVIDED/REVIEWED AND GIVEN TO PATIENT Stroke warning signs and symptoms How to activate emergency medical system (call 911). Medications prescribed at discharge. Need for follow-up after discharge. Personal risk factors for stroke. Pneumonia vaccine given:  Flu vaccine given:  My questions have been answered, the writing is legible, and I understand these instructions.  I will adhere to these goals & educational materials that have been provided to me after my discharge from the  hospital.      My questions have been answered and I understand these instructions. I will adhere to these goals and the provided educational materials after my discharge from the hospital.  Patient/Caregiver Signature _______________________________ Date __________  Clinician Signature _______________________________________ Date __________  Please bring this form and your medication list with you to all your follow-up doctor's appointments.

## 2015-12-24 NOTE — Progress Notes (Signed)
Speech Language Pathology Daily Session Note  Patient Details  Name: James Cortez MRN: [id number] Date of Birth: 12-05-1965  Today's Date: 12/24/2015 SLP Individual Time: 0900-1000 SLP Individual Time Calculation (min): 60 min   Short Term Goals: Week 1: SLP Short Term Goal 1 (Week 1): Patient will maintain selective attention for 30 minutes given supervision verbal cues in a minimally distracting environment.  SLP Short Term Goal 2 (Week 1): Patient will demonstrate basic, functional problem-solving given supervision verbal cues.  SLP Short Term Goal 3 (Week 1): Given Mod A multimodal phonemic, written, and visual cues, patient will communicate basic wants/needs at the phrase level.  SLP Short Term Goal 4 (Week 1): Patient will follow basic 2-step commands given Min A multimodal cues. SLP Short Term Goal 5 (Week 1): Patient will display no overt s/s of aspiration on current diet with Mod A verbal cues for use of swallowing compensatory strategies.  SLP Short Term Goal 6 (Week 1): Patient will consume trials of thin liquids with minimal overt s/s of aspiration across 2 sessions given Mod A verbal cues prior to follow-up MBS.   Skilled Therapeutic Interventions: Pt was seen for skilled ST targeting dysphagia and communication goals.  Pt consumed dys 3 solids and thin liquids with mod I use of swallowing precautions and no overt s/s of aspiration.  Reviewed and reinforced swallowing precautions with pt.  He was able to recall recommendations from previous study with min question cues.  Will complete education with significant other at next available appointment prior to discharge.  Therapist also facilitated the session with a verbal description and reasoning task targeting verbal expression at the phrase level.  Pt needed overall min assist verbal cues for sentence generation to expand responses to the phrase and short sentence level.  Pt also benefited from min assist verbal cues to recognize and  correct verbal errors at the phrase and short sentence level.  Pt needed min assist verbal cues for safety when ambulating to and from ST treatment room due to decreased awareness of obstacles on the right.  Pt was returned to room and left in recliner with call bell within reach.  Continue per current plan of care.       Function:  Eating Eating   Modified Consistency Diet: Yes Eating Assist Level: More than reasonable amount of time           Cognition Comprehension Comprehension assist level: Understands basic 90% of the time/cues < 10% of the time  Expression   Expression assist level: Expresses basic 75 - 89% of the time/requires cueing 10 - 24% of the time. Needs helper to occlude trach/needs to repeat words.  Social Interaction Social Interaction assist level: Interacts appropriately with others with medication or extra time (anti-anxiety, antidepressant).  Problem Solving Problem solving assist level: Solves basic 75 - 89% of the time/requires cueing 10 - 24% of the time  Memory Memory assist level: Recognizes or recalls 75 - 89% of the time/requires cueing 10 - 24% of the time    Pain Pain Assessment Pain Assessment: No/denies pain  Therapy/Group: Individual Therapy  Ebon Ketchum, Elmyra Ricks L 12/24/2015, 10:00 AM

## 2015-12-24 NOTE — Progress Notes (Signed)
Subjective/Complaints:no issues   ROS-  No CP, SOB N/V/D Objective: Vital Signs: Blood pressure 102/77, pulse 71, temperature 97.9 F (36.6 C), temperature source Oral, resp. rate 17, height _0  (1.702 m), weight 69 kg (152 lb 1.9 oz), SpO2 97 %. Dg Swallowing Func-speech Pathology  Result Date: 12/22/2015 Objective Swallowing Evaluation: Type of Study: MBS-Modified Barium Swallow Study Patient Details Name: James Cortez MRN: [id number] Date of Birth: 04-13-1965 Today's Date: 12/22/2015 Time: SLP Start Time: 0917-SLP Stop Time: 0940 SLP Time Calculation (min): 23 min Past Medical History: Past Medical History: Diagnosis Date . Skin cancer  Past Surgical History: Past Surgical History: Procedure Laterality Date . EP IMPLANTABLE DEVICE N/A 12/15/2015  Procedure: Loop Recorder Insertion;  Surgeon: Will Meredith Leeds, MD;  Location: [facility];  Service: Cardiovascular;  Laterality: N/A; . IR GENERIC HISTORICAL  12/08/2015  IR PERCUTANEOUS ART THROMBECTOMY/INFUSION INTRACRANIAL INC DIAG ANGIO 12/08/2015 Luanne Bras, MD [facility] . RADIOLOGY WITH ANESTHESIA Right 12/08/2015  Procedure: RADIOLOGY WITH ANESTHESIA - CODE STROKE;  Surgeon: Medication Radiologist, MD;  Location: [facility];  Service: Radiology;  Laterality: Right; . SKIN SURGERY   . TEE WITHOUT CARDIOVERSION N/A 12/15/2015  Procedure: TRANSESOPHAGEAL ECHOCARDIOGRAM (TEE);  Surgeon: Sueanne Margarita, MD;  Location: [facility] ENDOSCOPY;  Service: Cardiovascular;  Laterality: N/A; HPI: James Floresis a 50 y.o.malewith no significant medical history presenting with right facial droop, right hemiparesis and difficulty with speech. HereceivedIV t-PA 12/08/2015 at 1822. Revascularization of L M2 with solitaire, stent assisted angioplasty and IA tPA with reocclusion leading to a TICI 2b revascularization.  Initiated on a Dys 1, nectar thick liquids diet per initial MBS due to silent aspiration of thin liquids.  Repeat objective assessment  ordered today to determine readiness for advancement.    Assessment / Plan / Recommendation CHL IP CLINICAL IMPRESSIONS 12/22/2015 Therapy Diagnosis Mild pharyngeal phase dysphagia;Mild oral phase dysphagia Clinical Impression Pt presents with a mild oropharyngeal dysphagia with both sensory and motor deficits.  Pt's oral phase is improved but remains prolonged with decreased base of tongue retraction which lead to premature spillage of materials into the oropharynx (liquids > solids).  The abovementioned deficits in addition to sensory impairment lead to delay in swallow initiation.  Swallow response was triggered at the vallecula with smaller sips of thin liquids and thicker viscosities.  Response was most consistently triggered at the pyriforms with thin liquids as pt was noted to naturally take larger sips.  Despite delay in swallow initiation, pt was able to keep the airway clear of penetration or aspiration.  The only instance of airway invasion occurred with mixed pill and liquid consistencies which resulted in deep penetration to the level of the cords.  Penetration was sensed and appeared to clear most materials from the cords.  Recommend advancing pt's diet to dys 3 solids and thin liquids with full supervision for use of swallowing precautions: small bites/sips, no straws, slow rate, meds whole in puree.  Prognosis for continued advancement good with ongoing ST interventions for management of safe diet progression and to reinforce use of swallowing precautions.   Impact on safety and function Mild aspiration risk   CHL IP TREATMENT RECOMMENDATION 12/12/2015 Treatment Recommendations Therapy as outlined in treatment plan below   Prognosis 12/22/2015 Prognosis for Safe Diet Advancement Good Barriers to Reach Goals -- Barriers/Prognosis Comment -- CHL IP DIET RECOMMENDATION 12/22/2015 SLP Diet Recommendations Dysphagia 3 (Mech soft) solids;Thin liquid Liquid Administration via Cup;No straw Medication  Administration Whole meds with puree Compensations Minimize  environmental distractions;Slow rate;Small sips/bites Postural Changes Seated upright at 90 degrees            CHL IP ORAL PHASE 12/22/2015 Oral Phase Impaired Oral - Pudding Teaspoon -- Oral - Pudding Cup -- Oral - Honey Teaspoon -- Oral - Honey Cup -- Oral - Nectar Teaspoon -- Oral - Nectar Cup Piecemeal swallowing;Lingual/palatal residue;Decreased bolus cohesion;Premature spillage;Weak lingual manipulation Oral - Nectar Straw -- Oral - Thin Teaspoon -- Oral - Thin Cup Weak lingual manipulation;Piecemeal swallowing;Delayed oral transit;Decreased bolus cohesion;Premature spillage Oral - Thin Straw Weak lingual manipulation;Piecemeal swallowing;Decreased bolus cohesion;Premature spillage Oral - Puree Weak lingual manipulation;Delayed oral transit;Lingual/palatal residue Oral - Mech Soft -- Oral - Regular Impaired mastication;Delayed oral transit;Lingual/palatal residue Oral - Multi-Consistency -- Oral - Pill -- Oral Phase - Comment --  CHL IP PHARYNGEAL PHASE 12/22/2015 Pharyngeal Phase Impaired Pharyngeal- Pudding Teaspoon -- Pharyngeal -- Pharyngeal- Pudding Cup -- Pharyngeal -- Pharyngeal- Honey Teaspoon -- Pharyngeal -- Pharyngeal- Honey Cup -- Pharyngeal -- Pharyngeal- Nectar Teaspoon -- Pharyngeal -- Pharyngeal- Nectar Cup Delayed swallow initiation-vallecula;Delayed swallow initiation-pyriform sinuses;Reduced tongue base retraction;Pharyngeal residue - valleculae;Reduced laryngeal elevation Pharyngeal -- Pharyngeal- Nectar Straw -- Pharyngeal -- Pharyngeal- Thin Teaspoon -- Pharyngeal -- Pharyngeal- Thin Cup Delayed swallow initiation-pyriform sinuses;Reduced laryngeal elevation;Reduced tongue base retraction;Pharyngeal residue - valleculae Pharyngeal -- Pharyngeal- Thin Straw Delayed swallow initiation-pyriform sinuses;Reduced laryngeal elevation;Reduced tongue base retraction;Pharyngeal residue - valleculae;Reduced airway/laryngeal  closure;Penetration/Aspiration during swallow Pharyngeal Material enters airway, CONTACTS cords and then ejected out Pharyngeal- Puree Delayed swallow initiation-vallecula;Pharyngeal residue - valleculae;Reduced tongue base retraction Pharyngeal -- Pharyngeal- Mechanical Soft -- Pharyngeal -- Pharyngeal- Regular Delayed swallow initiation-vallecula;Reduced tongue base retraction;Pharyngeal residue - valleculae Pharyngeal -- Pharyngeal- Multi-consistency -- Pharyngeal -- Pharyngeal- Pill -- Pharyngeal -- Pharyngeal Comment --  CHL IP CERVICAL ESOPHAGEAL PHASE 12/22/2015 Cervical Esophageal Phase WFL Pudding Teaspoon -- Pudding Cup -- Honey Teaspoon -- Honey Cup -- Nectar Teaspoon -- Nectar Cup -- Nectar Straw -- Thin Teaspoon -- Thin Cup -- Thin Straw -- Puree -- Mechanical Soft -- Regular -- Multi-consistency -- Pill -- Cervical Esophageal Comment -- No flowsheet data found. Page, Selinda Orion 12/22/2015, 9:54 AM              Results for orders placed or performed during the hospital encounter of 12/15/15 (from the past 72 hour(s))  Creatinine, serum     Status: None   Collection Time: 12/22/15  5:26 AM  Result Value Ref Range   Creatinine, Ser 0.91 0.61 - 1.24 mg/dL   GFR calc non Af Amer >60 >60 mL/min   GFR calc Af Amer >60 >60 mL/min    Comment: (NOTE) The eGFR has been calculated using the CKD EPI equation. This calculation has not been validated in all clinical situations. eGFR's persistently <60 mL/min signify possible Chronic Kidney Disease.       General: No acute distress Mood and affect are appropriate Heart: Regular rate and rhythm no rubs murmurs or extra sounds, loop recorder site, bruising , drsg removed, no erythema or drainage, steristrips intact Lungs: Clear to auscultation, breathing unlabored, no rales or wheezes Abdomen: Positive bowel sounds, soft nontender to palpation, nondistended, ecchymosis LLQ Extremities: No clubbing, cyanosis, or edema, IV right forearm Skin: No  evidence of breakdown, no evidence of rash Neurologic: Cranial nerves II through XII intact, motor strength is 5/5 in Left  deltoid, bicep, tricep, grip, hip flexor, knee extensors, ankle dorsiflexor and plantar flexor 4+/5 on RIght side Sensory exam normal sensation to light touch and proprioception in bilateral upper  and lower extremities     Assessment/Plan: 1. Functional deficits secondary to Left MCA infarct which require 3+ hours per day of interdisciplinary therapy in a comprehensive inpatient rehab setting. Physiatrist is providing close team supervision and 24 hour management of active medical problems listed below. Physiatrist and rehab team continue to assess barriers to discharge/monitor patient progress toward functional and medical goals. FIM: Function - Bathing Position: Shower (standing) Body parts bathed by patient: Right arm, Left arm, Chest, Abdomen, Front perineal area, Buttocks, Right upper leg, Left upper leg, Right lower leg, Left lower leg, Back Bathing not applicable: Back Assist Level: Supervision or verbal cues Set up : To obtain items  Function- Upper Body Dressing/Undressing What is the patient wearing?: Pull over shirt/dress Pull over shirt/dress - Perfomed by patient: Thread/unthread right sleeve, Thread/unthread left sleeve, Put head through opening, Pull shirt over trunk Assist Level: More than reasonable time Function - Lower Body Dressing/Undressing What is the patient wearing?: Underwear, Pants, Non-skid slipper socks Position:  (sitting on toilet) Underwear - Performed by patient: Thread/unthread right underwear leg, Thread/unthread left underwear leg, Pull underwear up/down Pants- Performed by patient: Thread/unthread right pants leg, Thread/unthread left pants leg, Pull pants up/down Non-skid slipper socks- Performed by patient: Don/doff right sock, Don/doff left sock Socks - Performed by patient: Don/doff right sock, Don/doff left sock Socks -  Performed by helper: Don/doff right sock, Don/doff left sock Assist for footwear: Setup Assist for lower body dressing: More than reasonable time  Function - Toileting Toileting steps completed by patient: Adjust clothing prior to toileting, Performs perineal hygiene, Adjust clothing after toileting Toileting steps completed by helper: Adjust clothing after toileting Toileting Assistive Devices: Grab bar or rail Assist level: Supervision or verbal cues  Function - Air cabin crew transfer assistive device: Grab bar Assist level to toilet: No Help, no cues, assistive device, takes more than a reasonable amount of time Assist level from toilet: No Help, no cues, assistive device, takes more than a reasonable amount of time  Function - Chair/bed transfer Chair/bed transfer method: Ambulatory Chair/bed transfer assist level: Supervision or verbal cues Chair/bed transfer assistive device: Armrests Chair/bed transfer details: Verbal cues for precautions/safety  Function - Locomotion: Wheelchair Will patient use wheelchair at discharge?: No Wheelchair activity did not occur: N/A (pt ambulatory on unit) Function - Locomotion: Ambulation Assistive device: No device Max distance: 1000 ft Assist level: Supervision or verbal cues Assist level: Supervision or verbal cues Assist level: Supervision or verbal cues Assist level: Supervision or verbal cues Assist level: Supervision or verbal cues  Function - Comprehension Comprehension: Auditory Comprehension assist level: Understands basic 75 - 89% of the time/ requires cueing 10 - 24% of the time  Function - Expression Expression: Verbal Expression assistive device: Other (Comment) (spanish speaking) Expression assist level: Expresses basic 50 - 74% of the time/requires cueing 25 - 49% of the time. Needs to repeat parts of sentences.  Function - Social Interaction Social Interaction assist level: Interacts appropriately with others  with medication or extra time (anti-anxiety, antidepressant).  Function - Problem Solving Problem solving assist level: Solves basic 75 - 89% of the time/requires cueing 10 - 24% of the time  Function - Memory Memory assist level: Recognizes or recalls 50 - 74% of the time/requires cueing 25 - 49% of the time Patient normally able to recall (first 3 days only): That he or she is in a hospital, Location of own room, Current season, Staff names and faces    Medical Problem List  and Plan: 1.  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 Status post loop recorder-              -CIR PT, OT, SLP, plan d/c in am2.  DVT Prophylaxis/Anticoagulation: Subcutaneous Lovenox. Monitor platelet counts for any signs of bleeding, has large bruise LLQ monitor 3. Pain Management: Tylenol as needed 4. Aphasia/dysphagia.Dysphagia upgraded to D3 thin liquid. Follow-up speech therapy       5. Neuropsych: This patient is not capable of making decisions on his own behalf. 6. Skin/Wound Care: Routine skin checks 7. Fluids/Electrolytes/Nutrition: Routine I&O with follow-up chemistries upon admission tomorrow,100% meals 8.Tobacco abuse.Counseling  LOS (Days) 9 A FACE TO FACE EVALUATION WAS PERFORMED  James Cortez 12/24/2015, 7:40 AM

## 2015-12-24 NOTE — Progress Notes (Signed)
Occupational Therapy Discharge Summary  Patient Details  Name: James Cortez MRN: [id number] Date of Birth: 12-Jun-1965  Today's Date: 12/24/2015 OT Individual Time: 1105-1200 OT Individual Time Calculation (min): 55 min     Patient has met 11 of 11 long term goals due to improved activity tolerance, improved balance, ability to compensate for deficits, functional use of  RIGHT upper extremity, improved attention, improved awareness and improved coordination.  Patient to discharge at overall Modified Independent level, recommending 24 hour supervision due to higher level cognitive impairments.  Patient's care partner is independent to provide the necessary cognitive assistance at discharge.  Reasons goals not met: N/A  Recommendation:  Patient will benefit from ongoing skilled OT services in outpatient setting to continue to advance functional skills in the area of BADL, iADL and Reduce care partner burden.   Equipment: No equipment provided  Reasons for discharge: treatment goals met and discharge from hospital  Patient/family agrees with progress made and goals achieved: Yes  OT Discharge Precautions/Restrictions Precautions Precaution Comments: Rt inattention  General   Vital Signs Therapy Vitals Temp: 97.6 F (36.4 C) Temp Source: Oral Pulse Rate: 72 Resp: 18 BP: 112/68 Patient Position (if appropriate): Sitting Oxygen Therapy SpO2: 98 % O2 Device: Not Delivered Pain Pain Assessment Pain Assessment: No/denies pain ADL ADL ADL Comments: Please see functional navigator Vision/Perception  Vision- History Baseline Vision/History: Wears glasses Wears Glasses: Reading only Patient Visual Report: No change from baseline Vision- Assessment Vision Assessment?: No apparent visual deficits Perception Comments: improved at time of d/c, however pt continues to demonstrate Rt inattention  Cognition Overall Cognitive Status: Impaired/Different from  baseline Arousal/Alertness: Awake/alert Orientation Level: Oriented X4 (cues for orientation to november) Attention: Alternating Focused Attention: Appears intact Sustained Attention: Appears intact Selective Attention: Appears intact Alternating Attention: Appears intact Awareness: Appears intact Awareness Impairment: Emergent impairment Sensation Sensation Light Touch: Appears Intact Proprioception: Appears Intact Coordination Gross Motor Movements are Fluid and Coordinated: Yes Fine Motor Movements are Fluid and Coordinated: No Finger Nose Finger Test: mildly impaired on R, but improved from evaluation Heel Shin Test: R=L 9 Hole Peg Test: Lt: 41 seconds, Rt: 1:22 Motor  Motor Motor: Within Functional Limits Mobility  Bed Mobility Supine to Sit: 7: Independent Sit to Supine: 7: Independent Transfers Sit to Stand: 6: Modified independent (Device/Increase time) Stand to Sit: 6: Modified independent (Device/Increase time)  Trunk/Postural Assessment  Cervical Assessment Cervical Assessment: Within Functional Limits Thoracic Assessment Thoracic Assessment: Within Functional Limits Lumbar Assessment Lumbar Assessment: Within Functional Limits Postural Control Postural Control: Within Functional Limits Protective Responses: improved by d/c  Balance Standardized Balance Assessment Standardized Balance Assessment: Berg Balance Test Berg Balance Test Sit to Stand: Able to stand without using hands and stabilize independently Standing Unsupported: Able to stand safely 2 minutes Sitting with Back Unsupported but Feet Supported on Floor or Stool: Able to sit safely and securely 2 minutes Stand to Sit: Sits safely with minimal use of hands Transfers: Able to transfer safely, minor use of hands Standing Unsupported with Eyes Closed: Able to stand 10 seconds safely Standing Ubsupported with Feet Together: Able to place feet together independently and stand 1 minute safely From  Standing, Reach Forward with Outstretched Arm: Can reach confidently >25 cm (10") From Standing Position, Pick up Object from Floor: Able to pick up shoe safely and easily From Standing Position, Turn to Look Behind Over each Shoulder: Looks behind from both sides and weight shifts well Turn 360 Degrees: Able to turn 360 degrees safely in 4  seconds or less Standing Unsupported, Alternately Place Feet on Step/Stool: Able to stand independently and safely and complete 8 steps in 20 seconds Standing Unsupported, One Foot in Front: Able to place foot tandem independently and hold 30 seconds Standing on One Leg: Able to lift leg independently and hold > 10 seconds Total Score: 56 Extremity/Trunk Assessment RUE Assessment RUE Assessment: Exceptions to [facility] RUE Strength RUE Overall Strength: Deficits RUE Overall Strength Comments: overall WFL, however continues to demonstrate diminished Rt grip/pinch strength LUE Assessment LUE Assessment: Within Functional Limits   See Function Navigator for Current Functional Status.  [name] 12/24/2015, 3:18 PM

## 2015-12-25 ENCOUNTER — Encounter (HOSPITAL_COMMUNITY): Payer: Self-pay | Admitting: Speech Pathology

## 2015-12-25 MED ORDER — CLOPIDOGREL BISULFATE 75 MG PO TABS: 75.0000 mg | ORAL_TABLET | Freq: Every day | ORAL | 1 refills | Status: DC

## 2015-12-25 MED ORDER — ASPIRIN 325 MG PO TBEC: 325.0000 mg | DELAYED_RELEASE_TABLET | Freq: Every day | ORAL | 0 refills | Status: DC

## 2015-12-25 MED ORDER — ATORVASTATIN CALCIUM 20 MG PO TABS: 20.0000 mg | ORAL_TABLET | Freq: Every day | ORAL | 1 refills | Status: DC

## 2015-12-25 NOTE — Progress Notes (Signed)
Speech Language Pathology Discharge Summary  Patient Details  Name: Torre Schaumburg MRN: [id number] Date of Birth: Apr 22, 1965  Today's Date: 12/25/2015 SLP Individual Time: 0900-0940 SLP Individual Time Calculation (min): 40 min    Skilled Therapeutic Interventions:  Pt seen for caregiver education with emphasis on areas that require assist, compensatory strategies and recommendations for additional therapy. Discussed initial 24/7 supervision to ensure safety. Need for cell phone or list of important numbers for pt to be able to call for assist when needed. Discussed need for assist with medication and financial management and pt as an unreliable source for his own capabilities. Demonstrated use of phonemic and semantic cueing to facilitate word-finding. Sentence completion and reading also reviewed. Pt and family verbalized understanding of strategies and POC for additional therapy.     Patient has met 7 of 7 long term goals.  Patient to discharge at overall Supervision level.  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 Pt would benefit from ongoing SLP services to maximize functional independence.  Care Partner:  Caregiver Able to Provide Assistance: Yes  Type of Caregiver Assistance: Cognitive  Recommendation:  Home Health SLP;Outpatient SLP;24 hour supervision/assistance  Rationale for SLP Follow Up: Maximize functional communication;Reduce caregiver burden;Maximize cognitive function and independence   Equipment:     Reasons for discharge: Discharged from hospital   Patient/Family Agrees with Progress Made and Goals Achieved: Yes   Function:  Eating Eating   Modified Consistency Diet: Yes Eating Assist Level: No help, No cues           Cognition Comprehension Comprehension assist level: Understands basic 90% of the time/cues < 10% of the time  Expression    Expression assist level: Expresses basic 75 - 89% of the time/requires cueing 10 - 24% of the time. Needs helper to occlude trach/needs to repeat words.  Social Interaction Social Interaction assist level: Interacts appropriately with others with medication or extra time (anti-anxiety, antidepressant).  Problem Solving Problem solving assist level: Solves basic 75 - 89% of the time/requires cueing 10 - 24% of the time  Memory Memory assist level: Recognizes or recalls 75 - 89% of the time/requires cueing 10 - 24% of the time   Vinetta Bergamo MA, CCC-SLP 12/25/2015, 12:23 PM

## 2015-12-25 NOTE — Progress Notes (Signed)
Subjective/Complaints:no issues Aware of d/c   ROS-  No CP, SOB N/V/D Objective: Vital Signs: Blood pressure 122/68, pulse 74, temperature 97.8 F (36.6 C), temperature source Oral, resp. rate 16, height 5\' 7"  (1.702 m), weight 70 kg (154 lb 5.2 oz), SpO2 99 %. No results found. No results found for this or any previous visit (from the past 72 hour(s)).    General: No acute distress Mood and affect are appropriate Heart: Regular rate and rhythm no rubs murmurs or extra sounds, loop recorder site, bruising , drsg removed, no erythema or drainage, steristrips intact Lungs: Clear to auscultation, breathing unlabored, no rales or wheezes Abdomen: Positive bowel sounds, soft nontender to palpation, nondistended, ecchymosis LLQ Extremities: No clubbing, cyanosis, or edema, IV right forearm Skin: No evidence of breakdown, no evidence of rash Neurologic: Cranial nerves II through XII intact, motor strength is 5/5 in Left  deltoid, bicep, tricep, grip, hip flexor, knee extensors, ankle dorsiflexor and plantar flexor 4+/5 on RIght side Sensory exam normal sensation to light touch and proprioception in bilateral upper and lower extremities     Assessment/Plan: 1. Functional deficits secondary to Left MCA infarct Stable for D/C today F/u PCP in 3-4 weeks F/u PM&R 2 weeks See D/C summary See D/C instructions FIM: Function - Bathing Position: Shower (in standing) Body parts bathed by patient: Right arm, Left arm, Chest, Abdomen, Front perineal area, Buttocks, Right upper leg, Left upper leg, Right lower leg, Left lower leg, Back Bathing not applicable: Back Assist Level: More than reasonable time Set up : To obtain items  Function- Upper Body Dressing/Undressing What is the patient wearing?: Pull over shirt/dress Pull over shirt/dress - Perfomed by patient: Thread/unthread right sleeve, Thread/unthread left sleeve, Put head through opening, Pull shirt over trunk Assist Level: More than  reasonable time Function - Lower Body Dressing/Undressing What is the patient wearing?: Underwear, Pants, Socks, Shoes Position:  (sitting on toilet) Underwear - Performed by patient: Thread/unthread right underwear leg, Thread/unthread left underwear leg, Pull underwear up/down Pants- Performed by patient: Thread/unthread right pants leg, Thread/unthread left pants leg, Pull pants up/down Non-skid slipper socks- Performed by patient: Don/doff right sock, Don/doff left sock Socks - Performed by patient: Don/doff right sock, Don/doff left sock Socks - Performed by helper: Don/doff right sock, Don/doff left sock Shoes - Performed by patient: Don/doff right shoe, Don/doff left shoe Assist for footwear: Independent Assist for lower body dressing: More than reasonable time  Function - Toileting Toileting steps completed by patient: Adjust clothing prior to toileting, Performs perineal hygiene, Adjust clothing after toileting Toileting steps completed by helper: Adjust clothing after toileting Toileting Assistive Devices: Grab bar or rail Assist level: Supervision or verbal cues  Function - Air cabin crew transfer assistive device: Grab bar Assist level to toilet: No Help, no cues, assistive device, takes more than a reasonable amount of time Assist level from toilet: No Help, no cues, assistive device, takes more than a reasonable amount of time  Function - Chair/bed transfer Chair/bed transfer method: Ambulatory Chair/bed transfer assist level: No Help, no cues, assistive device, takes more than a reasonable amount of time Chair/bed transfer assistive device: Armrests Chair/bed transfer details: Verbal cues for precautions/safety  Function - Locomotion: Wheelchair Will patient use wheelchair at discharge?: No Wheelchair activity did not occur: N/A (pt ambulatory on unit) Function - Locomotion: Ambulation Assistive device: No device Max distance: 250 Assist level: No help, No  cues, assistive device, takes more than a reasonable amount of time Assist level:  No help, No cues, assistive device, takes more than a reasonable amount of time Assist level: No help, No cues, assistive device, takes more than a reasonable amount of time Assist level: No help, No cues, assistive device, takes more than a reasonable amount of time Assist level: No help, No cues, assistive device, takes more than a reasonable amount of time  Function - Comprehension Comprehension: Auditory Comprehension assist level: Understands basic 90% of the time/cues < 10% of the time  Function - Expression Expression: Verbal Expression assistive device: Other (Comment) (spanish speaking) Expression assist level: Expresses basic 75 - 89% of the time/requires cueing 10 - 24% of the time. Needs helper to occlude trach/needs to repeat words.  Function - Social Interaction Social Interaction assist level: Interacts appropriately with others with medication or extra time (anti-anxiety, antidepressant).  Function - Problem Solving Problem solving assist level: Solves basic 75 - 89% of the time/requires cueing 10 - 24% of the time  Function - Memory Memory assist level: Recognizes or recalls 75 - 89% of the time/requires cueing 10 - 24% of the time Patient normally able to recall (first 3 days only): That he or she is in a hospital, Location of own room, Current season, Staff names and faces    Medical Problem List and Plan: 1.  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 Status post loop recorder-              -CIR PT, OT, SLP, plan d/c in am2.  DVT Prophylaxis/Anticoagulation: Subcutaneous Lovenox. Monitor platelet counts for any signs of bleeding, has large bruise LLQ monitor 3. Pain Management: Tylenol as needed 4. Aphasia/dysphagia.Dysphagia upgraded to D3 thin  liquid. Follow-up speech therapy       5. Neuropsych: This patient is not capable of making decisions on his own behalf. 6. Skin/Wound Care: Routine skin checks 7. Fluids/Electrolytes/Nutrition: Routine I&O with follow-up chemistries upon admission tomorrow,100% meals 8.Tobacco abuse.Counseling  LOS (Days) 10 A FACE TO FACE EVALUATION WAS PERFORMED  James Cortez E 12/25/2015, 7:25 AM

## 2015-12-25 NOTE — Progress Notes (Signed)
Social Work  Discharge Note  The overall goal for the admission was met for:   Discharge location: [place]   Length of Stay: Yes-10 DAYS  Discharge activity level: Yes-SUPERVISION LEVEL DUE TO COGNITION AND SAFETY  Home/community participation: Yes  Services provided included: MD, RD, PT, OT, SLP, RN, CM, TR, Pharmacy and SW  Financial Services: Other: PENDING MEDICAID  Follow-up services arranged: Home Health: [facility]-PT,SP,RN,OT,SW and Patient/Family has no preference for HH/DME agencies  Comments (or additional information):PT MADE GOOD PROGRESS AND GOT TO S SUPERVISION LEVEL DUE TO COGNITION. FAMILY TO PROVIDE 24 HR SUPERVISION LEVEL. MATCH PROGRAM GIVEN TO PT AND MONICA FOR MEDICATION ASSISTANCE. [place] PCP FOLLOW UP, APPOINTMENT 11/21 @ 3:00 PM. GAVE VOCATIONAL REHAB RESOURCES.WENT OVER INFORMATION WITH PT, GIRLFRIEND AND INTERPRETOR  Patient/Family verbalized understanding of follow-up arrangements: Yes  Individual responsible for coordination of the follow-up plan: MONICA-GIRLFRIEND & PATIENT  Confirmed correct DME delivered: Elease Hashimoto 12/25/2015    Elease Hashimoto

## 2015-12-25 NOTE — Discharge Summary (Signed)
NAMEJAHVION, HISLOP                 ACCOUNT NO.:  [id number]  MEDICAL RECORD NO.:  [id number]  LOCATION:  4W22C                        FACILITY:  [facility]  PHYSICIAN:  Lauraine Rinne, P.A.  DATE OF BIRTH:  [birth year]  DATE OF ADMISSION:  12/15/2015 DATE OF DISCHARGE: 12/25/2015                              DISCHARGE SUMMARY   DISCHARGE DIAGNOSES: 1. Left middle cerebral artery infarct. 2. Subcutaneous Lovenox for deep venous thrombosis prophylaxis. 3. Aphasia with dysphagia. 4. Tobacco abuse. 5. Hyperlipidemia.  HISTORY OF PRESENT ILLNESS:  This is a 50 year old Spanish, limited English-speaking, right-handed male, history of tobacco abuse, who lives with his cousin.  Independent prior to admission.  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  Presented on December 08, 2015 with right-sided weakness, facial droop, and aphasia.  Cranial CT scan negative.  The patient did not receive tPA.  CT angiogram showed acute occlusion of M1 segment of the left middle cerebral artery.  MRI showed patchy multifocal acute left MCA territory infarction, no associated hemorrhage.  Underwent revascularization with mechanical thrombectomy and MCA stent per Interventional Radiology.  The patient self-extubated after procedure.  Echocardiogram with ejection fraction of 55% to 60%.  No wall motion abnormalities.  Maintained on Plavix for CVA prophylaxis as well as aspirin.  Subcutaneous Lovenox for DVT prophylaxis.  Venous Doppler study showed evidence of chronic superficial vein thrombosis, right and left lesser saphenous veins.  No evidence of deep vein thrombosis involving bilateral lower extremities. Dysphagia #1 nectar thick liquid.  Physical and occupational therapy ongoing.  The patient was admitted for a comprehensive rehab program.  PAST MEDICAL HISTORY:  See discharge diagnoses.  SOCIAL HISTORY:  Lives with cousin.  Plans to stay with family on discharge.  Functional  status upon admission to rehab services was minimal assist, 120 feet without assistive device; minimal assist, stand pivot transfers; min to mod assist, activities of daily living.  PHYSICAL EXAMINATION:  VITAL SIGNS:  Blood pressure 100/56, pulse 63, temperature 98, respirations 18. GENERAL:  This was an alert male, aphasic, limited English-speaking. LUNGS:  Clear to auscultation without wheeze. CARDIAC:  Regular rate and rhythm without murmur. ABDOMEN:  Soft, nontender.  Good bowel sounds. NEUROLOGIC:  Noted word finding difficulties.  Followed simple commands. Right central 7 and tongue deviation.  REHABILITATION HOSPITAL COURSE:  The patient was admitted to inpatient rehab services with therapies initiated on a 3-hour daily basis, consisting of physical therapy, occupational therapy, speech therapy, and rehabilitation nursing.  The following issues were addressed during the patient's rehabilitation stay.  Pertaining to Mr. [name]' left MCA infarct, remained stable, maintained on aspirin and Plavix therapy.  He would follow up Neurology Services.  He had undergone revascularization mechanical thrombectomy with MCA stenting status post loop recorder.  He would follow up with Interventional Radiology.  Subcutaneous Lovenox for DVT prophylaxis.  No bleeding episodes.  His diet was advanced to mechanical soft.  He had been placed on Lipitor for hyperlipidemia. Blood pressure is well controlled.  He did have a history of tobacco abuse, receiving full counseling in regard to cessation of nicotine products.  It was questionable if he would be compliant with these requests.  The patient received weekly collaborative interdisciplinary team conferences to discuss estimated length of stay, family teaching, any barriers to his discharge.  The patient was ambulating without assistive device throughout the rehab unit.  Required moderate cues for use of signs in the hospital, increase visual  scanning to the right to find locations.  Minimal to supervision assist provided with balance training.  He could gather belongings for activities of daily living and homemaking, working with safety awareness.  Full family teaching was completed.  Advised need for supervision for the patient's safety.  He was able to express his needs.  DISCHARGE MEDICATIONS:  Included aspirin 325 mg p.o. daily, Lipitor 20 mg p.o. daily, Plavix 75 mg p.o. daily, Tylenol as needed.  The patient would follow up with Dr. Alysia Penna at the [facility] as advised; Dr. Curt Bears, Cardiology Services, call for appointment; Dr. Estanislado Pandy, Interventional Radiology; Dr. Erlinda Hong, Neurology Services.  Case management arranging for primary care provider.  SPECIAL INSTRUCTIONS:  No driving.  No smoking.  No alcohol.     Lauraine Rinne, P.A.     DA/MEDQ  D:  12/24/2015  T:  12/25/2015  Job:  [id number]  cc:   Sanjeev K. Estanislado Pandy, M.D. Dr. Erlinda Hong, Neurology Services Charlett Blake, M.D.

## 2015-12-28 ENCOUNTER — Telehealth: Payer: Self-pay | Admitting: *Deleted

## 2015-12-28 NOTE — Telephone Encounter (Signed)
Transitional care call completed, appointment confirmed  Transitional Care Questions  Questions for our staff to ask patients on Transitional care 48 hour phone call:   1. Are you/is patient experiencing any problems since coming home? No Are there any questions regarding any aspect of care? No  2. Are there any questions regarding medications administration/dosing? No  Are meds being taken as prescribed? Yes Patient should review meds with caller to confirm   3. Have there been any falls? no   4. Has Home Health been to the house and/or have they contacted you? Yes If not, have you tried to contact them? Can we help you contact them?   5. Are bowels and bladder emptying properly? Are there any unexpected incontinence issues? If applicable, is patient following bowel/bladder programs?  6. Any fevers, problems with breathing, unexpected pain? No   7. Are there any skin problems or new areas of breakdown? No  8. Has the patient/family member arranged specialty MD follow up (ie cardiology/neurology/renal/surgical/etc)? Yes Can we help arrange? No   9. Does the patient need any other services or support that we can help arrange? No  10. Are caregivers following through as expected in assisting the patient? Yes   11. Has the patient quit smoking, drinking alcohol, or using drugs as recommended?  Patient is not drinking, smoking, or using illicit drugs

## 2015-12-29 ENCOUNTER — Inpatient Hospital Stay: Payer: Self-pay

## 2015-12-30 ENCOUNTER — Ambulatory Visit: Payer: Self-pay | Attending: Internal Medicine | Admitting: Physician Assistant

## 2015-12-30 ENCOUNTER — Telehealth: Payer: Self-pay | Admitting: Neurology

## 2015-12-30 ENCOUNTER — Telehealth: Payer: Self-pay | Admitting: Physical Medicine & Rehabilitation

## 2015-12-30 VITALS — BP 114/74 | HR 62 | Temp 97.6°F | Wt 159.4 lb

## 2015-12-30 DIAGNOSIS — Z5189 Encounter for other specified aftercare: Secondary | ICD-10-CM | POA: Insufficient documentation

## 2015-12-30 DIAGNOSIS — Z9911 Dependence on respirator [ventilator] status: Secondary | ICD-10-CM | POA: Insufficient documentation

## 2015-12-30 DIAGNOSIS — Z9889 Other specified postprocedural states: Secondary | ICD-10-CM | POA: Insufficient documentation

## 2015-12-30 DIAGNOSIS — F17200 Nicotine dependence, unspecified, uncomplicated: Secondary | ICD-10-CM

## 2015-12-30 DIAGNOSIS — I639 Cerebral infarction, unspecified: Secondary | ICD-10-CM

## 2015-12-30 DIAGNOSIS — Z7982 Long term (current) use of aspirin: Secondary | ICD-10-CM | POA: Insufficient documentation

## 2015-12-30 DIAGNOSIS — Z85828 Personal history of other malignant neoplasm of skin: Secondary | ICD-10-CM | POA: Insufficient documentation

## 2015-12-30 DIAGNOSIS — Z8673 Personal history of transient ischemic attack (TIA), and cerebral infarction without residual deficits: Secondary | ICD-10-CM | POA: Insufficient documentation

## 2015-12-30 NOTE — Progress Notes (Signed)
James Cortez  [id number]  [id number]  DOB - 04/14/1965  Chief Complaint  Patient presents with  . Hospitalization Follow-up       Subjective:   James Cortez is a 50 y.o. male with no sig PMHx comes here today for establishment of care. He was hospitalized from 12/08/15-12/25/15 for a stroke. He initially presented with right facial drooping, right hemiparesis and difficulty speaking. His initial CT scan of the head was negative. A CT angiogram showed an acute occlusion of the M1 segment of the left MCA. He was a TPA candidate and didn't receive TPA. He also was seen by interventional radiology who did endovascular revascularization/stenting. His hospital course was complicated by vent dependent respiratory failure. There was identified source of his stroke. He did not have a lot of risk factors. His A1c was 5.6. His LDL was 87. His lower extremity Dopplers were negative. His echo and TEE both were nonrevealing. Cardiology was contacted Place a loop recorder. So far has been no arrhythmias noted.   He went to rehabilitation on 12/15/2015 for continued physical, occupational and speech therapy. He was discharged home on aspirin and Plavix and statin therapy.  He's been compliant with his medications. He has stopped smoking. No chest pain. No shortness of breath. He does still have some blurred vision. He is not driving. No lightheadedness. No syncope. No headaches.   ROS: GEN: denies fever or chills, denies change in weight Skin: denies lesions or rashes HEENT: denies headache, earache, epistaxis, sore throat, or neck pain LUNGS: denies SHOB, dyspnea, PND, orthopnea CV: denies CP or palpitations ABD: denies abd pain, N or V EXT: denies muscle spasms or swelling; no pain in lower ext, no weakness NEURO: denies numbness or tingling, denies sz, stroke or TIA  ALLERGIES: No Known Allergies  PAST MEDICAL HISTORY: Past Medical History:  Diagnosis Date  . Skin cancer     PAST  SURGICAL HISTORY: Past Surgical History:  Procedure Laterality Date  . EP IMPLANTABLE DEVICE N/A 12/15/2015   Procedure: Loop Recorder Insertion;  Surgeon: Will Meredith Leeds, MD;  Location: [place] [facility];  Service: Cardiovascular;  Laterality: N/A;  . IR GENERIC HISTORICAL  12/08/2015   IR PERCUTANEOUS ART THROMBECTOMY/INFUSION INTRACRANIAL INC DIAG ANGIO 12/08/2015 Luanne Bras, MD [facility]  . RADIOLOGY WITH ANESTHESIA Right 12/08/2015   Procedure: RADIOLOGY WITH ANESTHESIA - CODE STROKE;  Surgeon: Medication Radiologist, MD;  Location: [place];  Service: Radiology;  Laterality: Right;  . SKIN SURGERY    . TEE WITHOUT CARDIOVERSION N/A 12/15/2015   Procedure: TRANSESOPHAGEAL ECHOCARDIOGRAM (TEE);  Surgeon: Sueanne Margarita, MD;  Location: [facility] ENDOSCOPY;  Service: Cardiovascular;  Laterality: N/A;    MEDICATIONS AT HOME: Prior to Admission medications   Medication Sig Start Date End Date Taking? Authorizing Provider  aspirin EC 325 MG EC tablet Take 1 tablet (325 mg total) by mouth daily. 12/25/15  Yes Daniel J Angiulli, PA-C  atorvastatin (LIPITOR) 20 MG tablet Take 1 tablet (20 mg total) by mouth daily at 6 PM. 12/25/15  Yes Lavon Paganini Angiulli, PA-C  clopidogrel (PLAVIX) 75 MG tablet Take 1 tablet (75 mg total) by mouth daily with breakfast. 12/25/15  Yes Daniel J Angiulli, PA-C  OVER THE COUNTER MEDICATION Place 1-2 drops into both eyes daily as needed (eye irritation).   Yes Historical Provider, MD     Objective:   Vitals:   12/30/15 1437  BP: 114/74  Pulse: 62  Temp: 97.6 F (36.4 C)  TempSrc: Oral  SpO2: 97%  Weight: 159 lb 6.4 oz (72.3 kg)    Exam General appearance : Awake, alert, not in any distress. Speech Clear. Not toxic looking HEENT: Atraumatic and Normocephalic, pupils equally reactive to light and accomodation Neck: supple, no JVD. No cervical lymphadenopathy.  Chest:Good air entry bilaterally, no added sounds  CVS: S1 S2 regular, no murmurs.    Abdomen: Bowel sounds present, Non tender and not distended with no gaurding, rigidity or rebound. Extremities: B/L Lower Ext shows no edema, both legs are warm to touch Neurology: Awake alert, and oriented X 3, CN II-XII intact, Non focal   Data Review Lab Results  Component Value Date   HGBA1C 5.6 12/09/2015     Assessment & Plan  1. 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 Smoker  -cessation underway    Return in about 2 weeks (around 01/13/2016).  The patient was given clear instructions to go to ER or return to medical center if symptoms don't improve, worsen or new problems develop. The patient verbalized understanding. The patient was told to call to get lab results if they haven't heard anything in the next week.   This note has been created with Surveyor, quantity. Any transcriptional errors are unintentional.    Zettie Pho, PA-C [facility] and [facility] [place], [place]   12/30/2015, 2:55 PM

## 2015-12-30 NOTE — Telephone Encounter (Signed)
James Cortez with [facility] is calling to get a verbal order for the patient to have Occupational Therapy.  His right dominate hand is weak and is having trouble with coordination.

## 2015-12-30 NOTE — Telephone Encounter (Signed)
Alonna Minium ST with [facility] needs to get verbal orders for patient 2w2 please call her at [phone].  Elsie Lincoln PT with [facility] needs to get a verbal order to eval and treat patient for OT, please call him at [phone].

## 2015-12-30 NOTE — Telephone Encounter (Signed)
Approval given to [employer].

## 2015-12-30 NOTE — Telephone Encounter (Signed)
Rn call Elsie Lincoln at [facility]. Rn gave verbal order for OT. Ronalee Belts verbalized understanding.

## 2016-01-04 ENCOUNTER — Encounter: Payer: Self-pay | Admitting: Physical Medicine & Rehabilitation

## 2016-01-04 ENCOUNTER — Encounter: Payer: Self-pay | Attending: Physical Medicine & Rehabilitation

## 2016-01-04 ENCOUNTER — Telehealth: Payer: Self-pay | Admitting: Physical Medicine & Rehabilitation

## 2016-01-04 ENCOUNTER — Ambulatory Visit (HOSPITAL_BASED_OUTPATIENT_CLINIC_OR_DEPARTMENT_OTHER): Payer: Self-pay | Admitting: Physical Medicine & Rehabilitation

## 2016-01-04 VITALS — BP 122/76 | HR 65 | Resp 14

## 2016-01-04 DIAGNOSIS — I69391 Dysphagia following cerebral infarction: Secondary | ICD-10-CM | POA: Insufficient documentation

## 2016-01-04 DIAGNOSIS — R269 Unspecified abnormalities of gait and mobility: Secondary | ICD-10-CM

## 2016-01-04 DIAGNOSIS — I69398 Other sequelae of cerebral infarction: Secondary | ICD-10-CM | POA: Insufficient documentation

## 2016-01-04 DIAGNOSIS — I63312 Cerebral infarction due to thrombosis of left middle cerebral artery: Secondary | ICD-10-CM

## 2016-01-04 DIAGNOSIS — R1310 Dysphagia, unspecified: Secondary | ICD-10-CM | POA: Insufficient documentation

## 2016-01-04 DIAGNOSIS — Z955 Presence of coronary angioplasty implant and graft: Secondary | ICD-10-CM | POA: Insufficient documentation

## 2016-01-04 DIAGNOSIS — Z5189 Encounter for other specified aftercare: Secondary | ICD-10-CM | POA: Insufficient documentation

## 2016-01-04 DIAGNOSIS — I69320 Aphasia following cerebral infarction: Secondary | ICD-10-CM | POA: Insufficient documentation

## 2016-01-04 DIAGNOSIS — I69354 Hemiplegia and hemiparesis following cerebral infarction affecting left non-dominant side: Secondary | ICD-10-CM | POA: Insufficient documentation

## 2016-01-04 DIAGNOSIS — Z9889 Other specified postprocedural states: Secondary | ICD-10-CM | POA: Insufficient documentation

## 2016-01-04 DIAGNOSIS — I69351 Hemiplegia and hemiparesis following cerebral infarction affecting right dominant side: Secondary | ICD-10-CM | POA: Insufficient documentation

## 2016-01-04 DIAGNOSIS — R52 Pain, unspecified: Secondary | ICD-10-CM | POA: Insufficient documentation

## 2016-01-04 NOTE — Patient Instructions (Signed)
No working yet  No driving yet  See you in one month

## 2016-01-04 NOTE — Telephone Encounter (Signed)
Wayne Both OT with [facility] needs to get verbal orders for 1w1 and 2w2.  Please call her at [phone].

## 2016-01-04 NOTE — Progress Notes (Signed)
Subjective:    Patient ID: James Cortez, male    DOB: [birth year], 50 y.o.   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 Initial CT was negative, CT angiogram showed occlusion of the M1 segment left MCA. MRI demonstrated left MCA territory infarct. Mechanical thrombectomy and left MCA stent placed by interventional radiology. Echo was normal Initially had swallowing problems placed on D1 nectar liquids Upgraded to modified independent status, but still having right-sided weakness, mild aphasia. Status post loop recorder insertion 12/15/2015 Receiving home health PT Has not worked since stroke. Has not driven since stroke. Wife has driven him here today  Pain Inventory Average Pain 0 Pain Right Now 0 My pain is no pain  In the last 24 hours, has pain interfered with the following? General activity 0 Relation with others 0 Enjoyment of life 0 What TIME of day is your pain at its worst? no pain Sleep (in general) Good  Pain is worse with: some activites Pain improves with: no pain Relief from Meds: no pain  Mobility ability to climb steps?  yes do you drive?  no  Function disabled: date disabled .  Neuro/Psych No problems in this area  Prior Studies Any changes since last visit?  no  Physicians involved in your care Any changes since last visit?  no   No family history on file. Social History   Social History  . Marital status: Married    Spouse name: N/A  . Number of children: N/A  . Years of education: N/A   Social History Main Topics  . Smoking status: Current Every Day Smoker    Packs/day: 1.00    Types: Cigarettes  . Smokeless tobacco:  Never Used  . Alcohol use Yes     Comment: rare  . Drug use: No  . Sexual activity: Not Asked   Other Topics Concern  . None   Social History Narrative  . None   Past Surgical History:  Procedure Laterality Date  . EP IMPLANTABLE DEVICE N/A 12/15/2015   Procedure: Loop Recorder Insertion;  Surgeon: Will Meredith Leeds, MD;  Location: [place] [facility];  Service: Cardiovascular;  Laterality: N/A;  . IR GENERIC HISTORICAL  12/08/2015   IR PERCUTANEOUS ART THROMBECTOMY/INFUSION INTRACRANIAL INC DIAG ANGIO 12/08/2015 Luanne Bras, MD [facility]  . RADIOLOGY WITH ANESTHESIA Right 12/08/2015   Procedure: RADIOLOGY WITH ANESTHESIA - CODE STROKE;  Surgeon: Medication Radiologist, MD;  Location: [place];  Service: Radiology;  Laterality: Right;  . SKIN SURGERY    . TEE WITHOUT CARDIOVERSION N/A 12/15/2015   Procedure: TRANSESOPHAGEAL ECHOCARDIOGRAM (TEE);  Surgeon: Sueanne Margarita, MD;  Location: [facility] ENDOSCOPY;  Service: Cardiovascular;  Laterality: N/A;   Past Medical History:  Diagnosis Date  . Skin cancer    BP 122/76   Pulse 65   Resp 14   SpO2 96%   Opioid Risk Score:   Fall Risk Score:  `1  Depression screen PHQ 2/9  No flowsheet data found.  Review of Systems  Constitutional: Negative.   HENT: Negative.   Eyes: Negative.   Respiratory: Negative.   Cardiovascular: Negative.   Gastrointestinal: Negative.   Endocrine: Negative.   Genitourinary: Negative.   Musculoskeletal: Negative.   Skin: Negative.  Allergic/Immunologic: Negative.   Neurological: Negative.   Hematological: Negative.   Psychiatric/Behavioral: Negative.   All other systems reviewed and are negative.      Objective:   Physical Exam  Constitutional: He appears well-developed and well-nourished.  HENT:  Head: Normocephalic and atraumatic.  Eyes: Conjunctivae and EOM are normal. Pupils are equal, round, and reactive to light.  Neck: Normal range of motion.  Cardiovascular: Normal rate,  regular rhythm and normal heart sounds.   No murmur heard. Pulmonary/Chest: Effort normal and breath sounds normal. No respiratory distress. He has no wheezes. He has no rales. He exhibits no tenderness.  Abdominal: Soft. Bowel sounds are normal. He exhibits no distension. There is no tenderness.  Musculoskeletal: Normal range of motion. He exhibits no edema, tenderness or deformity.  Neurological: He is alert. No cranial nerve deficit. He exhibits normal muscle tone. Coordination and gait abnormal.  Motor strength is 4/5 in the right deltoid, biceps, triceps, grip, 5/5 in the left deltoid, biceps, triceps, grip 5/5 bilateral hip flexor, knee extensor, ankle dorsiflexor, plantar flexion  Increased base of support. No evidence of toe drag or knee instability.  Skin: Skin is warm and dry.  Psychiatric: He has a normal mood and affect. His behavior is normal.  Nursing note and vitals reviewed.         Assessment & Plan:    Medical Problem List and Plan: 1.  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 Status post loop recorder Follow-up cardiology 01/11/2016   On Plavix as per neurology follow-up with neurology 03/15/2016             . Continue home health therapy, may need outpatient for additional strengthening,given goal of return to work as Horticulturist, commercial  2. Pain Management: No current issues 3. Aphasia. Continue home health speech for.Hypercholesterolemia, continue atorvastatin. 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

## 2016-01-04 NOTE — Telephone Encounter (Signed)
approved

## 2016-01-07 ENCOUNTER — Telehealth (HOSPITAL_COMMUNITY): Payer: Self-pay

## 2016-01-07 NOTE — Telephone Encounter (Signed)
Called to schedule f/u, no answer. Will call back with interpreter. AW

## 2016-01-11 ENCOUNTER — Telehealth: Payer: Self-pay

## 2016-01-11 ENCOUNTER — Encounter: Payer: Self-pay | Admitting: Cardiology

## 2016-01-11 NOTE — Telephone Encounter (Signed)
Wayne Both OT called to release patient 1 visit early due to [place] [place]

## 2016-01-13 ENCOUNTER — Ambulatory Visit: Payer: Self-pay | Attending: Family Medicine | Admitting: Family Medicine

## 2016-01-13 ENCOUNTER — Encounter: Payer: Self-pay | Admitting: Family Medicine

## 2016-01-13 VITALS — BP 126/79 | HR 78 | Temp 98.4°F | Ht 68.5 in | Wt 164.2 lb

## 2016-01-13 DIAGNOSIS — I63512 Cerebral infarction due to unspecified occlusion or stenosis of left middle cerebral artery: Secondary | ICD-10-CM

## 2016-01-13 DIAGNOSIS — Z7982 Long term (current) use of aspirin: Secondary | ICD-10-CM | POA: Insufficient documentation

## 2016-01-13 DIAGNOSIS — Z85828 Personal history of other malignant neoplasm of skin: Secondary | ICD-10-CM | POA: Insufficient documentation

## 2016-01-13 DIAGNOSIS — M79641 Pain in right hand: Secondary | ICD-10-CM

## 2016-01-13 DIAGNOSIS — I69351 Hemiplegia and hemiparesis following cerebral infarction affecting right dominant side: Secondary | ICD-10-CM

## 2016-01-13 DIAGNOSIS — I69320 Aphasia following cerebral infarction: Secondary | ICD-10-CM | POA: Insufficient documentation

## 2016-01-13 DIAGNOSIS — R29810 Facial weakness: Secondary | ICD-10-CM | POA: Insufficient documentation

## 2016-01-13 DIAGNOSIS — Z7902 Long term (current) use of antithrombotics/antiplatelets: Secondary | ICD-10-CM | POA: Insufficient documentation

## 2016-01-13 MED ORDER — ATORVASTATIN CALCIUM 20 MG PO TABS: 20.0000 mg | ORAL_TABLET | Freq: Every day | ORAL | 2 refills | Status: DC

## 2016-01-13 MED ORDER — CLOPIDOGREL BISULFATE 75 MG PO TABS: 75.0000 mg | ORAL_TABLET | Freq: Every day | ORAL | 2 refills | Status: DC

## 2016-01-13 MED ORDER — GABAPENTIN 300 MG PO CAPS: 300.0000 mg | ORAL_CAPSULE | Freq: Two times a day (BID) | ORAL | 3 refills | Status: DC

## 2016-01-13 MED FILL — CLOPIDOGREL 75 MG TABLET: 75 | 30 days supply | Qty: 30 | Fill #0

## 2016-01-13 MED FILL — ATORVASTATIN 20 MG TABLET: 20 | 30 days supply | Qty: 30 | Fill #0

## 2016-01-13 MED FILL — GABAPENTIN 300 MG CAPSULE: 300 | 30 days supply | Qty: 60 | Fill #0

## 2016-01-13 NOTE — Progress Notes (Signed)
Subjective:  Patient ID: James Cortez, male    DOB: November 28, 1965  Age: 50 y.o. MRN: [id number]  CC: Cerebrovascular Accident and Hand Pain (right side)   HPI Jonattan Beckham is a 50 year old male with recent left middle cerebral artery stroke (with initial presentation of aphasia, right facial droop and right hemiparesis) who received TPA with improvement in stroke score. He also underwent endovascular revascularization. Loop recorder implanted during hospitalization.  He had a follow-up visit with a PA 2 weeks ago and is accompanied by his wife today for a follow-up visit complaining of right hand pain which is rated at an 8/10 and is worse with movement. Pain is described as throbbing and he denies dropping things He does have some residual aphasia but has no visual complaints. Currently undergoing physical, occupational and speech therapy with resulting improvement.  He has follow-up appointment for evaluation of his loop recorder comes up in 2 months. Past Medical History:  Diagnosis Date  . Skin cancer     Past Surgical History:  Procedure Laterality Date  . EP IMPLANTABLE DEVICE N/A 12/15/2015   Procedure: Loop Recorder Insertion;  Surgeon: Will Meredith Leeds, MD;  Location: [place] [facility];  Service: Cardiovascular;  Laterality: N/A;  . IR GENERIC HISTORICAL  12/08/2015   IR PERCUTANEOUS ART THROMBECTOMY/INFUSION INTRACRANIAL INC DIAG ANGIO 12/08/2015 Luanne Bras, MD [facility]  . RADIOLOGY WITH ANESTHESIA Right 12/08/2015   Procedure: RADIOLOGY WITH ANESTHESIA - CODE STROKE;  Surgeon: Medication Radiologist, MD;  Location: [place];  Service: Radiology;  Laterality: Right;  . SKIN SURGERY    . TEE WITHOUT CARDIOVERSION N/A 12/15/2015   Procedure: TRANSESOPHAGEAL ECHOCARDIOGRAM (TEE);  Surgeon: Sueanne Margarita, MD;  Location: [facility] ENDOSCOPY;  Service: Cardiovascular;  Laterality: N/A;    No Known Allergies   Outpatient Medications Prior to Visit  Medication Sig Dispense  Refill  . aspirin EC 325 MG EC tablet Take 1 tablet (325 mg total) by mouth daily. 30 tablet 0  . atorvastatin (LIPITOR) 20 MG tablet Take 1 tablet (20 mg total) by mouth daily at 6 PM. 30 tablet 1  . clopidogrel (PLAVIX) 75 MG tablet Take 1 tablet (75 mg total) by mouth daily with breakfast. 30 tablet 1  . OVER THE COUNTER MEDICATION Place 1-2 drops into both eyes daily as needed (eye irritation).     No facility-administered medications prior to visit.     ROS Review of Systems  Constitutional: Negative for activity change and appetite change.  HENT: Negative for sinus pressure and sore throat.   Eyes: Negative for visual disturbance.  Respiratory: Negative for cough, chest tightness and shortness of breath.   Cardiovascular: Negative for chest pain and leg swelling.  Gastrointestinal: Negative for abdominal distention, abdominal pain, constipation and diarrhea.  Endocrine: Negative.   Genitourinary: Negative for dysuria.  Musculoskeletal:       See hpi  Skin: Negative for rash.  Allergic/Immunologic: Negative.   Neurological: Negative for weakness, light-headedness and numbness.  Psychiatric/Behavioral: Negative for dysphoric mood and suicidal ideas.    Objective:  BP 126/79 (BP Location: Right Arm, Patient Position: Sitting, Cuff Size: Small)   Pulse 78   Temp 98.4 F (36.9 C) (Oral)   Ht 5' 8.5" (1.74 m)   Wt 164 lb 3.2 oz (74.5 kg)   SpO2 97%   BMI 24.60 kg/m   BP/Weight 01/13/2016 01/04/2016 [id number]  Systolic BP 123XX123 [id number] [id number]  Diastolic BP 79 76 74  Wt. (Lbs) 164.2 - 159.4  BMI 24.6 -  24.97      Physical Exam  Constitutional: He is oriented to person, place, and time. He appears well-developed and well-nourished.  Cardiovascular: Normal rate, normal heart sounds and intact distal pulses.   No murmur heard. Pulmonary/Chest: Effort normal and breath sounds normal. He has no wheezes. He has no rales. He exhibits no tenderness.  Loop recorder in place.    Abdominal: Soft. Bowel sounds are normal. He exhibits no distension and no mass. There is no tenderness.  Musculoskeletal: Normal range of motion. He exhibits no edema (no edema of the right hand) or tenderness (Mild tenderness on range of motion of right wrist).  Neurological: He is alert and oriented to person, place, and time.  Mild aphasia     Assessment & Plan:   1. 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  Dispense: 30 tablet; Refill: 2 - atorvastatin (LIPITOR) 20 MG tablet; Take 1 tablet (20 mg total) by mouth daily at 6 PM.  Dispense: 30 tablet; Refill: 2  2. Hemiparesis affecting right side as late effect of cerebrovascular accident (CVA) ([place]) Continue physical therapy, occupational therapy   3. Pain of right hand Residual from stroke Placed on gabapentin-discussed side effect profile   Meds ordered this encounter  Medications  . gabapentin (NEURONTIN) 300 MG capsule    Sig: Take 1 capsule (300 mg total) by mouth 2 (two) times daily.    Dispense:  60 capsule    Refill:  3  . clopidogrel (PLAVIX) 75 MG tablet    Sig: Take 1 tablet (75 mg total) by mouth daily with breakfast.    Dispense:  30 tablet    Refill:  2  . atorvastatin (LIPITOR) 20 MG tablet    Sig: Take 1 tablet (20 mg total) by mouth daily at 6 PM.    Dispense:  30 tablet    Refill:  2    Follow-up: Return in about 6 weeks (around 02/24/2016) for Follow-up of CVA.   Arnoldo Morale MD

## 2016-01-14 ENCOUNTER — Ambulatory Visit (INDEPENDENT_AMBULATORY_CARE_PROVIDER_SITE_OTHER): Payer: Self-pay | Admitting: *Deleted

## 2016-01-14 ENCOUNTER — Telehealth: Payer: Self-pay | Admitting: *Deleted

## 2016-01-14 DIAGNOSIS — Z4509 Encounter for adjustment and management of other cardiac device: Secondary | ICD-10-CM

## 2016-01-14 NOTE — Telephone Encounter (Signed)
Alonna Minium, Speech Therapist for [facility] left a message asking for verbal orders to extended homehealth speech therapy for 2 more weeks to address Aphasia........verbal orders given

## 2016-01-14 NOTE — Telephone Encounter (Signed)
[facility] called office for extended 2 week [place] for [place].

## 2016-01-18 ENCOUNTER — Ambulatory Visit (INDEPENDENT_AMBULATORY_CARE_PROVIDER_SITE_OTHER): Payer: Self-pay | Admitting: Cardiology

## 2016-01-18 ENCOUNTER — Encounter (INDEPENDENT_AMBULATORY_CARE_PROVIDER_SITE_OTHER): Payer: Self-pay

## 2016-01-18 ENCOUNTER — Encounter: Payer: Self-pay | Admitting: Cardiology

## 2016-01-18 VITALS — BP 110/72 | HR 72 | Ht 67.0 in | Wt 164.0 lb

## 2016-01-18 DIAGNOSIS — Z8673 Personal history of transient ischemic attack (TIA), and cerebral infarction without residual deficits: Secondary | ICD-10-CM

## 2016-01-18 NOTE — Progress Notes (Signed)
Electrophysiology Office Note   Date:  01/18/2016   ID:  James Cortez, DOB 08-Nov-1965, MRN [id number]  PCP:  Arnoldo Morale, MD Primary Electrophysiologist:  Constance Haw, MD    Chief Complaint  Patient presents with  . other    Loop recorder/ Stroke     History of Present Illness: James Cortez is a 50 y.o. male who presents today for electrophysiology evaluation.   Hx CVA treated with TPA. Had TEE without abnormality and LINQ implant.Since then, he is felt well without complaint. He has had no chest pain, shortness of breath, PND, orthopnea, or palpitations.   Today, he denies symptoms of palpitations, chest pain, shortness of breath, orthopnea, PND, lower extremity edema, claudication, dizziness, presyncope, syncope, bleeding, or neurologic sequela. The patient is tolerating medications without difficulties and is otherwise without complaint today.    Past Medical History:  Diagnosis Date  . Skin cancer    Past Surgical History:  Procedure Laterality Date  . EP IMPLANTABLE DEVICE N/A 12/15/2015   Procedure: Loop Recorder Insertion;  Surgeon: Aissa Lisowski Meredith Leeds, MD;  Location: [place] [facility];  Service: Cardiovascular;  Laterality: N/A;  . IR GENERIC HISTORICAL  12/08/2015   IR PERCUTANEOUS ART THROMBECTOMY/INFUSION INTRACRANIAL INC DIAG ANGIO 12/08/2015 Luanne Bras, MD [facility]  . RADIOLOGY WITH ANESTHESIA Right 12/08/2015   Procedure: RADIOLOGY WITH ANESTHESIA - CODE STROKE;  Surgeon: Medication Radiologist, MD;  Location: [place];  Service: Radiology;  Laterality: Right;  . SKIN SURGERY    . TEE WITHOUT CARDIOVERSION N/A 12/15/2015   Procedure: TRANSESOPHAGEAL ECHOCARDIOGRAM (TEE);  Surgeon: Sueanne Margarita, MD;  Location: [facility] ENDOSCOPY;  Service: Cardiovascular;  Laterality: N/A;     Current Outpatient Prescriptions  Medication Sig Dispense Refill  . aspirin EC 325 MG EC tablet Take 1 tablet (325 mg total) by mouth daily. 30 tablet 0  . atorvastatin  (LIPITOR) 20 MG tablet Take 1 tablet (20 mg total) by mouth daily at 6 PM. 30 tablet 2  . clopidogrel (PLAVIX) 75 MG tablet Take 1 tablet (75 mg total) by mouth daily with breakfast. 30 tablet 2  . gabapentin (NEURONTIN) 300 MG capsule Take 1 capsule (300 mg total) by mouth 2 (two) times daily. 60 capsule 3  . OVER THE COUNTER MEDICATION Place 1-2 drops into both eyes daily as needed (eye irritation).     No current facility-administered medications for this visit.     Allergies:   Patient has no known allergies.   Social History:  The patient  reports that he has been smoking Cigarettes.  He has been smoking about 1.00 pack per day. He has never used smokeless tobacco. He reports that he drinks alcohol. He reports that he does not use drugs.   Family History:  The patient's Family history is unknown by patient.    ROS:  Please see the history of present illness.   Otherwise, review of systems is positive for visual changes, balance problems, muscle pain, headaches.   All other systems are reviewed and negative.    PHYSICAL EXAM: VS:  BP 110/72   Pulse 72   Ht 5\' 7"  (1.702 m)   Wt 164 lb (74.4 kg)   BMI 25.69 kg/m  , BMI Body mass index is 25.69 kg/m. GEN: Well nourished, well developed, in no acute distress  HEENT: normal  Neck: no JVD, carotid bruits, or masses Cardiac: RRR; no murmurs, rubs, or gallops,no edema  Respiratory:  clear to auscultation bilaterally, normal work of breathing  GI: soft, nontender, nondistended, + BS MS: no deformity or atrophy  Skin: warm and dry,  device pocket is well healed Neuro:  Strength and sensation are intact Psych: euthymic mood, full affect  EKG:  EKG is not ordered today. Personal review of the ekg ordered 11/7/17shows sinus rhythm, nonspecific T wave abnormality   Device interrogation is reviewed today in detail.  See PaceArt for details.   Recent Labs: 12/13/2015: Magnesium 1.9 12/16/2015: ALT 39; BUN 9; Hemoglobin 14.4; Platelets  358; Potassium 4.0; Sodium 139 12/22/2015: Creatinine, Ser 0.91    Lipid Panel     Component Value Date/Time   CHOL 159 12/09/2015 0430   TRIG 170 (H) 12/10/2015 0500   HDL 24 (L) 12/09/2015 0430   CHOLHDL 6.6 12/09/2015 0430   VLDL 48 (H) 12/09/2015 0430   LDLCALC 87 12/09/2015 0430     Wt Readings from Last 3 Encounters:  01/18/16 164 lb (74.4 kg)  01/13/16 164 lb 3.2 oz (74.5 kg)  12/30/15 159 lb 6.4 oz (72.3 kg)      Other studies Reviewed: Additional studies/ records that were reviewed today include: TTE 12/10/15  Review of the above records today demonstrates:  - Left ventricle: The cavity size was normal. Systolic function was   normal. The estimated ejection fraction was in the range of 55%   to 60%. Wall motion was normal; there were no regional wall   motion abnormalities. Left ventricular diastolic function   parameters were normal.   ASSESSMENT AND PLAN:  1.  Cryptogenic stroke: Apparently he missed his wound check for his Linq. Steri-Strips were removed without abnormality of the skin overlying the wound. He had no evidence of arrhythmia on his Linq monitor. Have given him education on follow-up and remote monitoring. No medication changes at this time.    Current medicines are reviewed at length with the patient today.   The patient does not have concerns regarding his medicines.  The following changes were made today:  none  Labs/ tests ordered today include:  No orders of the defined types were placed in this encounter.    Disposition:   FU with [place] Apgar PRN  Signed, Jayma Volpi Meredith Leeds, MD  01/18/2016 3:33 PM     [facility] [address] [place]  [place] [address] [phone] (office) [phone] (fax)

## 2016-01-18 NOTE — Patient Instructions (Signed)
Medication Instructions:    Your physician recommends that you continue on your current medications as directed. Please refer to the Current Medication list given to you today.  --- If you need a refill on your cardiac medications before your next appointment, please call your pharmacy. ---  Labwork:  None ordered  Testing/Procedures:  None ordered  Follow-Up:  No follow up is needed at this time with Dr. Curt Bears.  He will see you on an as needed basis.  Continue with monthly loop recorder monitoring.   Thank you for choosing [facility]!!   Trinidad Curet, RN [phone]

## 2016-01-19 NOTE — Progress Notes (Signed)
Carelink Summary Report / Loop Recorder 

## 2016-01-28 ENCOUNTER — Telehealth: Payer: Self-pay | Admitting: Cardiology

## 2016-01-28 NOTE — Telephone Encounter (Signed)
Spoke w/ pt and requested that he send a manual transmission b/c his home monitor has not updated in at least 14 days.   

## 2016-02-04 ENCOUNTER — Other Ambulatory Visit (HOSPITAL_COMMUNITY): Payer: Self-pay | Admitting: Interventional Radiology

## 2016-02-04 DIAGNOSIS — I639 Cerebral infarction, unspecified: Secondary | ICD-10-CM

## 2016-02-09 ENCOUNTER — Ambulatory Visit: Payer: Self-pay | Admitting: Physical Medicine & Rehabilitation

## 2016-02-11 ENCOUNTER — Ambulatory Visit (HOSPITAL_BASED_OUTPATIENT_CLINIC_OR_DEPARTMENT_OTHER): Payer: Self-pay | Admitting: Physical Medicine & Rehabilitation

## 2016-02-11 ENCOUNTER — Encounter: Payer: Self-pay | Admitting: Physical Medicine & Rehabilitation

## 2016-02-11 ENCOUNTER — Encounter: Payer: Self-pay | Attending: Physical Medicine & Rehabilitation

## 2016-02-11 ENCOUNTER — Encounter (INDEPENDENT_AMBULATORY_CARE_PROVIDER_SITE_OTHER): Payer: Self-pay

## 2016-02-11 VITALS — BP 119/77 | HR 71

## 2016-02-11 DIAGNOSIS — I69398 Other sequelae of cerebral infarction: Secondary | ICD-10-CM

## 2016-02-11 DIAGNOSIS — R269 Unspecified abnormalities of gait and mobility: Secondary | ICD-10-CM

## 2016-02-11 DIAGNOSIS — I69354 Hemiplegia and hemiparesis following cerebral infarction affecting left non-dominant side: Secondary | ICD-10-CM | POA: Insufficient documentation

## 2016-02-11 DIAGNOSIS — Z955 Presence of coronary angioplasty implant and graft: Secondary | ICD-10-CM | POA: Insufficient documentation

## 2016-02-11 DIAGNOSIS — I69391 Dysphagia following cerebral infarction: Secondary | ICD-10-CM | POA: Insufficient documentation

## 2016-02-11 DIAGNOSIS — Z5189 Encounter for other specified aftercare: Secondary | ICD-10-CM | POA: Insufficient documentation

## 2016-02-11 DIAGNOSIS — R52 Pain, unspecified: Secondary | ICD-10-CM | POA: Insufficient documentation

## 2016-02-11 DIAGNOSIS — Z9889 Other specified postprocedural states: Secondary | ICD-10-CM | POA: Insufficient documentation

## 2016-02-11 DIAGNOSIS — I69351 Hemiplegia and hemiparesis following cerebral infarction affecting right dominant side: Secondary | ICD-10-CM

## 2016-02-11 DIAGNOSIS — I63312 Cerebral infarction due to thrombosis of left middle cerebral artery: Secondary | ICD-10-CM

## 2016-02-11 DIAGNOSIS — I69320 Aphasia following cerebral infarction: Secondary | ICD-10-CM | POA: Insufficient documentation

## 2016-02-11 DIAGNOSIS — R1310 Dysphagia, unspecified: Secondary | ICD-10-CM | POA: Insufficient documentation

## 2016-02-11 MED ORDER — DICLOFENAC SODIUM 1 % TD GEL: 2.0000 g | Freq: Four times a day (QID) | TRANSDERMAL | 2 refills | Status: DC

## 2016-02-11 MED FILL — VOLTAREN 1% GEL: 1 | 37 days supply | Qty: 300 | Fill #0

## 2016-02-11 NOTE — Progress Notes (Signed)
Subjective:    Patient ID: James Cortez, male    DOB: 11/11/1965, 51 y.o.   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 Status post mechanical thrombectomy and left MCA stent HPI Finished PT 2 wks ago Mod I Dressing and bathing Sometimes forgets medication Wife and Spanish interpreter with the patient today. The Spanish interpreter sometimes states that the patient makes no sense when he talks. Patient notes that his right hand still cannot grip very much. 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 His wife is looking into disability, social services Pain Inventory Average Pain 10 Pain Right Now 10 My pain is constant and .  In the last 24 hours, has pain interfered with the following? General activity 8 Relation with others 9 Enjoyment of life 9 What TIME of day is your pain at its worst? daytime Sleep (in general) Good  Pain is worse with: walking Pain improves with: . Relief from Meds: .  Mobility walk without assistance ability to climb steps?  yes do you drive?  no Do you have any goals in this area?  no  Function not employed: date last employed 12-07-15 Do you have any goals in this area?  no  Neuro/Psych weakness  Prior Studies Any changes since last visit?  no  Physicians involved in your care Any changes since last visit?  no   Family History  Problem Relation Age of Onset  . Family history unknown: Yes   Social History   Social History  . Marital status: Married    Spouse name: N/A  . Number of children: N/A  . Years of education: N/A   Social History Main Topics  . Smoking status: Current Every Day Smoker    Packs/day: 1.00    Types: Cigarettes  . Smokeless tobacco: Never Used  . Alcohol use Yes     Comment: rare  . Drug use: No  . Sexual activity: Not on file   Other Topics Concern  . Not on file   Social History  Narrative  . No narrative on file   Past Surgical History:  Procedure Laterality Date  . EP IMPLANTABLE DEVICE N/A 12/15/2015   Procedure: Loop Recorder Insertion;  Surgeon: Will Meredith Leeds, MD;  Location: [place] [facility];  Service: Cardiovascular;  Laterality: N/A;  . IR GENERIC HISTORICAL  12/08/2015   IR PERCUTANEOUS ART THROMBECTOMY/INFUSION INTRACRANIAL INC DIAG ANGIO 12/08/2015 Luanne Bras, MD [facility]  . RADIOLOGY WITH ANESTHESIA Right 12/08/2015   Procedure: RADIOLOGY WITH ANESTHESIA - CODE STROKE;  Surgeon: Medication Radiologist, MD;  Location: [place];  Service: Radiology;  Laterality: Right;  . SKIN SURGERY    . TEE WITHOUT CARDIOVERSION N/A 12/15/2015   Procedure: TRANSESOPHAGEAL ECHOCARDIOGRAM (TEE);  Surgeon: Sueanne Margarita, MD;  Location: [facility];  Service: Cardiovascular;  Laterality: N/A;   Past Medical History:  Diagnosis Date  . Skin cancer    There were no vitals taken for this visit.  Opioid Risk Score:   Fall Risk Score:  `1  Depression screen PHQ 2/9  Depression screen PHQ 2/9 01/13/2016  Decreased Interest 3  Down, Depressed, Hopeless 0  PHQ - 2 Score 3  Altered sleeping 0  Tired, decreased energy 0  Change in appetite 0  Feeling bad or failure about yourself  0  Trouble concentrating 3  Moving slowly or fidgety/restless 3  Suicidal thoughts  0  PHQ-9 Score 9   Review of Systems  Constitutional: Positive for appetite change.  HENT: Negative.   Eyes: Negative.   Respiratory: Negative.   Cardiovascular: Negative.   Gastrointestinal: Negative.   Endocrine: Negative.   Genitourinary: Negative.   Musculoskeletal: Positive for arthralgias and myalgias.  Skin: Negative.   Allergic/Immunologic: Negative.   Neurological: Negative.   Hematological: Negative.   Psychiatric/Behavioral: Negative.   All other systems reviewed and are negative.      Objective:   Physical Exam  Constitutional: He is oriented to person, place, and  time. He appears well-developed and well-nourished.  HENT:  Head: Normocephalic and atraumatic.  Eyes: Conjunctivae and EOM are normal. Pupils are equal, round, and reactive to light.  Neck: Normal range of motion.  Neurological: He is alert and oriented to person, place, and time.  Skin: Skin is warm and dry.  Psychiatric: He has a normal mood and affect. Judgment and thought content normal. His speech is delayed. He is slowed.  Nursing note and vitals reviewed.  patient is oriented times 3 Motor strength is 3/5 in the right finger flexors, 4 at the biceps, triceps, 4 minus of the deltoid 5/5 in left deltoid by suppressive grip. 5/5 bilateral hip flexor, knee extensor, ankle dorsiflexor. Amb without assistive device. No evidence of toe drag or knee instability  Right wrist has decreased range of motion. There is mild swelling of the dorsum of the hand. No pain with MCP compression. There is Dupuytren contracture of flexor tendons to digits 2 and 3 on the right side. There is pain with passive extension of fingers.        Assessment & Plan:  1. Left MCA infarct with residual aphasia and right upper extremity weakness. For this reason, I don't think he is ready to return to driving or return to work. He may in time, return to driving. However, his prognosis for return to work as a Horticulturist, commercial is guarded.  2. Right hand swelling related to his stroke, this relates to the weakness in the right hand.  3. Right hand Dupuytren's contracture as well as wrist tendinitis. Start some Voltaren gel.  Discussed my findings and recommendations with the patient, his wife, as well as the Spanish interpreter Over half of the 25 min visit was spent counseling and coordinating care.

## 2016-02-11 NOTE — Patient Instructions (Signed)
Do not recommend return to work or recommend return to driving due to right upper extremity weakness as well as problems with memory and word finding related to stroke

## 2016-02-15 ENCOUNTER — Ambulatory Visit (INDEPENDENT_AMBULATORY_CARE_PROVIDER_SITE_OTHER): Payer: Self-pay | Admitting: *Deleted

## 2016-02-15 DIAGNOSIS — Z4509 Encounter for adjustment and management of other cardiac device: Secondary | ICD-10-CM

## 2016-02-15 NOTE — Progress Notes (Signed)
Carelink Summary Report / Loop Recorder 

## 2016-02-16 ENCOUNTER — Ambulatory Visit: Payer: Self-pay | Admitting: Physical Medicine & Rehabilitation

## 2016-02-16 ENCOUNTER — Ambulatory Visit: Payer: Self-pay

## 2016-02-17 ENCOUNTER — Ambulatory Visit (HOSPITAL_COMMUNITY): Payer: Self-pay

## 2016-02-18 ENCOUNTER — Encounter: Payer: Self-pay | Admitting: Family Medicine

## 2016-02-18 ENCOUNTER — Ambulatory Visit: Payer: Self-pay | Attending: Family Medicine | Admitting: Family Medicine

## 2016-02-18 VITALS — BP 129/81 | HR 68 | Temp 98.3°F | Ht 67.0 in | Wt 168.2 lb

## 2016-02-18 DIAGNOSIS — Z7902 Long term (current) use of antithrombotics/antiplatelets: Secondary | ICD-10-CM | POA: Insufficient documentation

## 2016-02-18 DIAGNOSIS — I69320 Aphasia following cerebral infarction: Secondary | ICD-10-CM | POA: Insufficient documentation

## 2016-02-18 DIAGNOSIS — Z7982 Long term (current) use of aspirin: Secondary | ICD-10-CM | POA: Insufficient documentation

## 2016-02-18 DIAGNOSIS — I69351 Hemiplegia and hemiparesis following cerebral infarction affecting right dominant side: Secondary | ICD-10-CM | POA: Insufficient documentation

## 2016-02-18 DIAGNOSIS — M7989 Other specified soft tissue disorders: Secondary | ICD-10-CM | POA: Insufficient documentation

## 2016-02-18 DIAGNOSIS — M79641 Pain in right hand: Secondary | ICD-10-CM | POA: Insufficient documentation

## 2016-02-18 DIAGNOSIS — M720 Palmar fascial fibromatosis [Dupuytren]: Secondary | ICD-10-CM | POA: Insufficient documentation

## 2016-02-18 DIAGNOSIS — Z85828 Personal history of other malignant neoplasm of skin: Secondary | ICD-10-CM | POA: Insufficient documentation

## 2016-02-18 DIAGNOSIS — I63512 Cerebral infarction due to unspecified occlusion or stenosis of left middle cerebral artery: Secondary | ICD-10-CM

## 2016-02-18 MED ORDER — PREDNISONE 20 MG PO TABS: 20.0000 mg | ORAL_TABLET | Freq: Two times a day (BID) | ORAL | 0 refills | Status: DC

## 2016-02-18 MED ORDER — GABAPENTIN 300 MG PO CAPS: 300.0000 mg | ORAL_CAPSULE | Freq: Two times a day (BID) | ORAL | 3 refills | Status: DC

## 2016-02-18 MED FILL — predniSONE 20 MG TABS: 20 | 5 days supply | Qty: 10 | Fill #0

## 2016-02-18 MED FILL — GABAPENTIN 300 MG CAPSULE: 300 | 30 days supply | Qty: 60 | Fill #0

## 2016-02-18 NOTE — Progress Notes (Signed)
Subjective:    Patient ID: James Cortez, male    DOB: 29-Aug-1965, 51 y.o.   MRN: [id number]  HPI James Cortez is a 51 year old male with recent left middle cerebral artery stroke (with initial presentation of aphasia, right facial droop and right hemiparesis) who received TPA with improvement in stroke score. He also underwent endovascular revascularization. Loop recorder implanted during hospitalization.  He has had right hand pain which is rated at an 8/10 and is worse with movement. Pain is described as throbbing and he denies dropping things. He has noticed swelling of his right hand and decreased ability to flex his index finger ever since he try to exercise at home; he does have an underlying Dupuytren's contracture in his right hand and has had surgery his left hand in the past He does have some residual aphasia but has no visual complaints. Not undergoing physical, occupational or speech therapy  Scheduled to see cardiology, neurology and rehabilitation medicine next month.  Past Medical History:  Diagnosis Date  . Skin cancer     Past Surgical History:  Procedure Laterality Date  . EP IMPLANTABLE DEVICE N/A 12/15/2015   Procedure: Loop Recorder Insertion;  Surgeon: Will Meredith Leeds, MD;  Location: [place] [facility];  Service: Cardiovascular;  Laterality: N/A;  . IR GENERIC HISTORICAL  12/08/2015   IR PERCUTANEOUS ART THROMBECTOMY/INFUSION INTRACRANIAL INC DIAG ANGIO 12/08/2015 Luanne Bras, MD [facility]  . RADIOLOGY WITH ANESTHESIA Right 12/08/2015   Procedure: RADIOLOGY WITH ANESTHESIA - CODE STROKE;  Surgeon: Medication Radiologist, MD;  Location: [place];  Service: Radiology;  Laterality: Right;  . SKIN SURGERY    . TEE WITHOUT CARDIOVERSION N/A 12/15/2015   Procedure: TRANSESOPHAGEAL ECHOCARDIOGRAM (TEE);  Surgeon: Sueanne Margarita, MD;  Location: [facility];  Service: Cardiovascular;  Laterality: N/A;   No Known Allergies  Current Outpatient Prescriptions on  File Prior to Visit  Medication Sig Dispense Refill  . aspirin EC 325 MG EC tablet Take 1 tablet (325 mg total) by mouth daily. 30 tablet 0  . atorvastatin (LIPITOR) 20 MG tablet Take 1 tablet (20 mg total) by mouth daily at 6 PM. 30 tablet 2  . clopidogrel (PLAVIX) 75 MG tablet Take 1 tablet (75 mg total) by mouth daily with breakfast. 30 tablet 2  . diclofenac sodium (VOLTAREN) 1 % GEL Apply 2 g topically 4 (four) times daily. (Patient not taking: Reported on 02/18/2016) 3 Tube 2  . gabapentin (NEURONTIN) 300 MG capsule Take 1 capsule (300 mg total) by mouth 2 (two) times daily. (Patient not taking: Reported on 02/18/2016) 60 capsule 3  . OVER THE COUNTER MEDICATION Place 1-2 drops into both eyes daily as needed (eye irritation).     No current facility-administered medications on file prior to visit.     Review of Systems Constitutional: Negative for activity change and appetite change.  HENT: Negative for sinus pressure and sore throat.   Eyes: Negative for visual disturbance.  Respiratory: Negative for cough, chest tightness and shortness of breath.   Cardiovascular: Negative for chest pain and leg swelling.  Gastrointestinal: Negative for abdominal distention, abdominal pain, constipation and diarrhea.  Endocrine: Negative.   Genitourinary: Negative for dysuria.  Musculoskeletal:       See hpi  Skin: Negative for rash.  Allergic/Immunologic: Negative.   Neurological: Negative for weakness, light-headedness and numbness.  Psychiatric/Behavioral: Negative for dysphoric mood and suicidal ideas    Objective: Vitals:   02/18/16 0900  BP: 129/81  Pulse: 68  Temp:  98.3 F (36.8 C)  TempSrc: Oral  SpO2: 98%  Weight: 168 lb 3.2 oz (76.3 kg)  Height: 5\' 7"  (1.702 m)      Physical Exam Constitutional: He is oriented to person, place, and time. He appears well-developed and well-nourished.  Cardiovascular: Normal rate, normal heart sounds and intact distal pulses.   No murmur  heard. Pulmonary/Chest: Effort normal and breath sounds normal. He has no wheezes. He has no rales. He exhibits no tenderness.  Loop recorder in place.  Abdominal: Soft. Bowel sounds are normal. He exhibits no distension and no mass. There is no tenderness.  Musculoskeletal: prominent and thickened flexor tendon in right hand,  Inability to completely flex both hands; decreased flexion of right index finger. He exhibits edema (edema of the right hand), (Mild tenderness on range of motion of right wrist).  Neurological: He is alert and oriented to person, place, and time.  Mild aphasia   CMP Latest Ref Rng & Units 12/22/2015 12/16/2015 12/15/2015  Glucose 65 - 99 mg/dL - 95 98  BUN 6 - 20 mg/dL - 9 7  Creatinine 0.61 - 1.24 mg/dL 0.91 0.66 0.67  Sodium 135 - 145 mmol/L - 139 139  Potassium 3.5 - 5.1 mmol/L - 4.0 4.1  Chloride 101 - 111 mmol/L - 108 109  CO2 22 - 32 mmol/L - 23 24  Calcium 8.9 - 10.3 mg/dL - 9.1 8.9  Total Protein 6.5 - 8.1 g/dL - 6.5 -  Total Bilirubin 0.3 - 1.2 mg/dL - 0.6 -  Alkaline Phos 38 - 126 U/L - 54 -  AST 15 - 41 U/L - 24 -  ALT 17 - 63 U/L - 39 -    Lipid Panel     Component Value Date/Time   CHOL 159 12/09/2015 0430   TRIG 170 (H) 12/10/2015 0500   HDL 24 (L) 12/09/2015 0430   CHOLHDL 6.6 12/09/2015 0430   VLDL 48 (H) 12/09/2015 0430   LDLCALC 87 12/09/2015 0430        Assessment & Plan:  1. 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  Dispense: 30 tablet; Refill: 2 - atorvastatin (LIPITOR) 20 MG tablet; Take 1 tablet (20 mg total) by mouth daily at 6 PM.  Dispense: 30 tablet; Refill: 2  2. Hemiparesis affecting right side as late effect of cerebrovascular accident (CVA) ([place]) Continue physical therapy, occupational  therapy   3. Pain of right hand/ Duputyren's contracture Residual from stroke Underlying Dupuytren's contracture with current acute inflammation Holding off on NSAIDs due to the fact that he is on Plavix. We'll do a short course of prednisone He might need corticosteroid injections in the near future-Will defer to rehabilitation medicine and possible hand surgery referral if need be. Placed on gabapentin-discussed side effect profile  This note has been created with Surveyor, quantity. Any transcriptional errors are unintentional.

## 2016-02-18 NOTE — Patient Instructions (Signed)
Ictus isqumico (Ischemic Stroke) Un ictus isqumico (accidente cerebrovascular o ACV) es la muerte repentina de tejido cerebral que ocurre cuando el oxgeno no llega a una zona del cerebro. Es una emergencia mdica que debe tratarse de inmediato. Un ictus isqumico puede causar una prdida permanente de la actividad cerebral. Esto puede causar problemas con el funcionamiento de diferentes partes del cuerpo. CAUSAS Esta afeccin es causada por la falta de oxgeno en una zona del cerebro, que puede ser el resultado de lo siguiente:  Un pequeo cogulo sanguneo (mbolos) o una acumulacin de placas en los vasos sanguneos (ateroesclerosis) que bloqueen el torrente sanguneo en el cerebro.  Ritmo cardaco anormal (fibrilacin auricular).  Una arteria obstruida o daada en la cabeza o el cuello. FACTORES DE RIESGO Hay ciertos factores que pueden hacer que sea ms propenso a sufrir esta afeccin. Algunos de [place] puede [place], como por ejemplo:  [place].  Fumar cigarrillos.  Tomar anticonceptivos por va oral, en especial si consume tabaco.  Falta de actividad fsica.  Consumo excesivo de bebidas alcohlicas.  Consumo de drogas ilegales, especialmente cocana y metanfetamina. Otros factores de riesgo son los siguientes:  Presin arterial elevada (hipertensin arterial).  Colesterol elevado.  Diabetes mellitus.  Cardiopata coronaria.  Ser afroamericano, norteamericano nativo, hispano o nativo de [place].  Ser mayor de 6aos.  Tener antecedentes familiares de ictus.  Tener antecedentes de cogulos sanguneos, ictus o ataques isqumicos transitorios (AIT).  Anemia drepanoctica.  Ser mujer con antecedentes de preeclampsia.  Cefalea migraosa.  Apnea del sueo.  Tener un ritmo cardaco irregular, como fibrilacin auricular.  Tener enfermedades inflamatorias crnicas, como artritis reumatoide o lupus.  Trastornos de Production assistant, radio). Gratis sntomas de esta afeccin, por lo general, aparecen de forma repentina, o puede notarlos despus de despertarse. Entre los sntomas repentinos se pueden incluir los siguientes:  Debilidad o adormecimiento de la cara, el brazo o la pierna, especialmente en un lado del cuerpo.  Dificultad para caminar, o para mover los brazos o las piernas.  Prdida del equilibrio o de la coordinacin.  Confusin.  Habla arrastrada (disartria).  Dificultad para hablar o comprender el lenguaje, o ambas (afasia).  Cambios en la visin (como visin doble, visin borrosa o prdida de la visin) enuno o ambos ojos.  Mareos.  Nuseas y vmitos.  Dolor de cabeza intenso sin causa aparente. Dolor de cabeza que generalmente se describe Scientist, forensic de cabeza que haya sufrido. En lo posible, tome nota de la hora exacta en la que se sinti normal por ltima vez y de la hora en que comenzaron los sntomas. Infrmele a su mdico. Si los sntomas van y vienen, esto podra ser un signo de ictus de advertencia o AIT. Busque ayuda de inmediato, incluso si se siente mejor. DIAGNSTICO Esta afeccin se puede diagnosticar en funcin de lo siguiente:  Los sntomas, sus antecedentes mdicos y un examen fsico.  Tomografa computarizada (TC) del cerebro.  Resonancia magntica (RM).  Angiografa por tomografa computarizada (TC). En esta prueba, se Constance Haw computadora para tomarle radiografas de las arterias. Pueden inyectarle un colorante en la sangre para observar el interior de los vasos sanguneos con ms claridad.  Angiografa por resonancia magntica (RM). Se trata de un tipo de resonancia magntica que se [place] para estudiar los vasos sanguneos.  Angiografa cerebral. En este estudio se utilizan rayosX y un colorante para observar los vasos sanguneos del cerebro y el cuello. Tal vez deba consultar a un mdico especialista  en ictus. Puede consultar a Radio producer, por telfono o mediante tecnologa a distancia (telemedicina). Se pueden realizar otros estudios para hallar la causa del ictus, que pueden incluir los siguientes:  Aeronautical engineer (ECG).  Monitorizacin electrocardiogrfica continua.  Ecocardiograma.  Ecografa de la cartida.  Estudio de la circulacin cerebral.  Anlisis de [place].  Estudio del sueo para verificar si tiene apnea del sueo. TRATAMIENTO El tratamiento de esta afeccin depender de la duracin, la gravedad y la causa de los sntomas, y de la zona del cerebro afectada. Es muy importante recibir tratamiento tras aparecer los primeros sntomas del ictus. Algunos tratamientos resultan ms eficaces si se realizan en el plazo de las 3 a 6horas del comienzo de los sntomas del ictus. Estos tratamientos pueden incluir los siguientes:  Aspirina.  Medicamentos para controlar la presin arterial.  Un medicamento inyectable para disolver el cogulo sanguneo (tromboltico).  Tratamientos aplicados directamente en la arteria afectada para eliminar o disolver el cogulo sanguneo. Otras opciones de tratamiento son las siguientes:  Oxgeno.  Lquidos por va IV.  Medicamentos para diluir la sangre (anticoagulantes o inhibidores plaquetarios).  Procedimientos para aumentar el flujo sanguneo. La administracin de medicamentos y los cambios en la dieta pueden ayudar a tratar y Chief Technology Officer los factores de riesgo de ictus, como la diabetes, el colesterol alto y la hipertensin arterial. Despus de un ictus, puede trabajar con fisioterapeutas, fonoaudilogos o terapeutas ocupacionales para ayudarlo a recuperarse. Cochrane los medicamentos de venta libre y los recetados solamente como se lo haya indicado el mdico.  Si le indicaron que tomara medicamentos para diluir la sangre, como aspirinas o anticoagulantes, tmelos exactamente como se lo haya  indicado el mdico.  El exceso de anticoagulantes puede causar hemorragias.  Si no toma la cantidad suficiente, no contar con la proteccin que necesita contra otro ictus y [employer].  Conozca los efectos secundarios de tomar anticoagulantes. Al tomar este tipo de medicamentos, asegrese de lo siguiente:  Oceanographer presin sobre las heridas por ms [employer] lo habitual.  Informe a su dentista y otros mdicos que toma anticoagulantes antes de que le realicen alguna intervencin quirrgica que pueda causar hemorragias.  Evite realizar actividades que puedan causarle traumatismos o lesiones. Comida y bebida   Siga las indicaciones del mdico acerca de la dieta.  Consuma alimentos saludables.  Si el ictus afect su capacidad para tragar, es posible que necesite tomar medidas para no ahogarse, como las siguientes:  Comer de a porciones pequeas.  Comer comidas blandas o en pur. Seguridad   Siga las indicaciones del equipo mdico con respecto a la actividad fsica.  Use un andador o un bastn como se lo haya indicado el mdico.  Tome medidas para crear un entorno seguro en su casa a fin de reducir el riesgo de cadas. Esto puede incluir lo siguiente:  Hacer que profesionales inspeccionen su casa.  Colocar barras para sostn en la habitacin y el bao.  Colocar inodoros elevados y un asiento en la ducha como medidas de seguridad. Instrucciones generales   No consuma ningn producto que contenga tabaco, lo que incluye cigarrillos, tabaco de Higher education careers adviser y Psychologist, sport and exercise. Si necesita ayuda para dejar de fumar, consulte al mdico.  Limite el consumo de alcohol a no ms de 1 medida por da si es mujer y no est Music therapist, y 2 medidas por da si es hombre. Una medida equivale a 12onzas de cerveza, 5onzas de vino o 1onzas  de bebidas alcohlicas de alta graduacin.  Si necesita ayuda para dejar de consumir drogas o alcohol, pdale al mdico que le recomiende un programa  o que lo derive a Teaching laboratory technician.  Mantenga un estilo de vida activo y saludable. Haga actividad fsica habitualmente como se lo haya indicado el mdico.  Acuda a todas las consultas de control como se lo haya indicado el mdico, incluidas las consultas con todos los especialistas de su equipo mdico. Esto es importante. PREVENCIN El riesgo de sufrir otro ictus puede disminuir al tratar, de manera adecuada, la hipertensin arterial, el colesterol alto, la diabetes, las cardiopatas coronarias, la apnea del sueo y la obesidad. Tambin puede disminuir si deja de fumar, limita el consumo de alcohol y se Printmaker. El mdico trabajar con usted para tomar medidas a fin de evitar las complicaciones del ictus a corto y a Barrister's clerk. SOLICITE ATENCIN MDICA DE INMEDIATO SI: Tiene los siguientes sntomas:   Tiene debilidad o adormecimiento sbito en el rostro, el brazo o la pierna, especialmente en un lado del cuerpo.  Confusin sbita.  Dificultad repentina para hablar o comprender el lenguaje, o ambas (afasia).  Dificultad repentina para ver con uno o ambos ojos.  Dificultad repentina para caminar o para mover los brazos o las piernas.  Mareos repentinos.  Prdida repentina del equilibrio o de la coordinacin.  Dolor de cabeza sbito e intenso sin causa aparente.  Prdida parcial o total del conocimiento.  Convulsiones. Cualquiera de estos sntomas puede representar un problema grave y es Engineer, maintenance (IT). No espere hasta que los sntomas desaparezcan. Solicite atencin mdica de inmediato. Comunquese con el servicio de emergencias de su localidad (911 en los [place]). No conduzca por sus propios medios [employer].  Esta informacin no tiene Marine scientist el consejo del mdico. Asegrese de hacerle al mdico cualquier pregunta que tenga. Document Released: 11/03/2004 Document Revised: 02/14/2014 Document Reviewed: 04/22/2015 Elsevier Interactive  Patient Education  2017 [employer].

## 2016-02-19 ENCOUNTER — Telehealth: Payer: Self-pay | Admitting: Family Medicine

## 2016-02-19 NOTE — Telephone Encounter (Signed)
Patient called requesting a refill for clopidogrel (PLAVIX) 75 MG tablet and atorvastatin (LIPITOR) 20 MG tablet Please f/u

## 2016-02-22 MED FILL — CLOPIDOGREL 75 MG TABLET: 75 | 30 days supply | Qty: 30 | Fill #1

## 2016-02-22 MED FILL — ATORVASTATIN 20 MG TABLET: 20 | 30 days supply | Qty: 30 | Fill #1

## 2016-02-22 NOTE — Telephone Encounter (Signed)
This was refilled at his last visit with me on I/11/18

## 2016-03-05 LAB — CUP PACEART REMOTE DEVICE CHECK
Date Time Interrogation Session: 20171207191301
MDC IDC PG IMPLANT DT: 20171107

## 2016-03-05 NOTE — Progress Notes (Signed)
Carelink summary report received. Battery status OK. Normal device function. No new tachy episodes, brady, or pause episodes. No new AF episodes. 1 symptom episode that appears SR throughout. Monthly summary reports and ROV/PRN

## 2016-03-09 ENCOUNTER — Telehealth: Payer: Self-pay | Admitting: Family Medicine

## 2016-03-09 NOTE — Telephone Encounter (Signed)
Patient is stating his hand is not getting better, he is experiencing pain even with medications....   Family member states that prescription prescribed for hand pain patient was experiencing side effects such as anxiousness..  Please follow up with patient.

## 2016-03-10 NOTE — Telephone Encounter (Signed)
He can discontinue gabapentin if he is experiencing side effects. He was prescribed Voltaren gel by the rehabilitation doctor and he might need to pick that up from the pharmacy to apply to his right hand

## 2016-03-10 NOTE — Telephone Encounter (Signed)
Writer called patient through [employer].  Writer informed him of MD's recommendations regarding gabapentin. Patient will pick up the voltaren gel at the pharmcay and will speak with Dr. Jarold Song next Tuesday when he comes in for an appt.

## 2016-03-14 ENCOUNTER — Ambulatory Visit (INDEPENDENT_AMBULATORY_CARE_PROVIDER_SITE_OTHER): Payer: Self-pay | Admitting: *Deleted

## 2016-03-14 DIAGNOSIS — Z4509 Encounter for adjustment and management of other cardiac device: Secondary | ICD-10-CM

## 2016-03-15 ENCOUNTER — Ambulatory Visit: Payer: MEDICAID | Admitting: Neurology

## 2016-03-15 NOTE — Progress Notes (Signed)
Carelink Summary Report / Loop Recorder 

## 2016-03-22 ENCOUNTER — Ambulatory Visit: Payer: Self-pay | Attending: Family Medicine | Admitting: Family Medicine

## 2016-03-22 ENCOUNTER — Encounter: Payer: Self-pay | Admitting: Family Medicine

## 2016-03-22 ENCOUNTER — Other Ambulatory Visit: Payer: Self-pay

## 2016-03-22 VITALS — BP 115/74 | HR 72 | Temp 97.9°F | Ht 67.0 in | Wt 173.6 lb

## 2016-03-22 DIAGNOSIS — Z7982 Long term (current) use of aspirin: Secondary | ICD-10-CM | POA: Insufficient documentation

## 2016-03-22 DIAGNOSIS — M79641 Pain in right hand: Secondary | ICD-10-CM | POA: Insufficient documentation

## 2016-03-22 DIAGNOSIS — I63512 Cerebral infarction due to unspecified occlusion or stenosis of left middle cerebral artery: Secondary | ICD-10-CM

## 2016-03-22 DIAGNOSIS — M720 Palmar fascial fibromatosis [Dupuytren]: Secondary | ICD-10-CM | POA: Insufficient documentation

## 2016-03-22 DIAGNOSIS — Z85828 Personal history of other malignant neoplasm of skin: Secondary | ICD-10-CM | POA: Insufficient documentation

## 2016-03-22 DIAGNOSIS — I69351 Hemiplegia and hemiparesis following cerebral infarction affecting right dominant side: Secondary | ICD-10-CM | POA: Insufficient documentation

## 2016-03-22 DIAGNOSIS — Z7902 Long term (current) use of antithrombotics/antiplatelets: Secondary | ICD-10-CM | POA: Insufficient documentation

## 2016-03-22 DIAGNOSIS — Z87891 Personal history of nicotine dependence: Secondary | ICD-10-CM | POA: Insufficient documentation

## 2016-03-22 DIAGNOSIS — M79601 Pain in right arm: Secondary | ICD-10-CM

## 2016-03-22 MED ORDER — DICLOFENAC SODIUM 1 % TD GEL: 2.0000 g | Freq: Four times a day (QID) | TRANSDERMAL | 2 refills | Status: DC

## 2016-03-22 MED ORDER — BACLOFEN 10 MG PO TABS: 10.0000 mg | ORAL_TABLET | Freq: Three times a day (TID) | ORAL | 3 refills | Status: DC

## 2016-03-22 MED ORDER — ASPIRIN 325 MG PO TBEC: 325.0000 mg | DELAYED_RELEASE_TABLET | Freq: Every day | ORAL | 2 refills | Status: DC

## 2016-03-22 MED FILL — CLOPIDOGREL 75 MG TABLET: 75 | 30 days supply | Qty: 30 | Fill #2

## 2016-03-22 MED FILL — ATORVASTATIN 20 MG TABLET: 20 | 30 days supply | Qty: 30 | Fill #2

## 2016-03-22 MED FILL — VOLTAREN 1% GEL: 1 | 12 days supply | Qty: 100 | Fill #0

## 2016-03-22 MED FILL — BACLOFEN 10 MG TABLET: 10 | 30 days supply | Qty: 90 | Fill #0

## 2016-03-22 NOTE — Patient Instructions (Signed)
Espasticidad (Spasticity) La espasticidad es una afeccin en la que los msculos se contraen de forma repentina e impredecible (espasmo). La espasticidad suele [employer], las piernas o la espalda. Tambin puede afectar el habla y la [place] de caminar. La espasticidad se puede manifestar como rigidez y Human resources officer de leve a grave, espasmos musculares incontrolables. La espasticidad grave puede ser dolorosa y Technical brewer los msculos en una posicin incmoda. INSTRUCCIONES PARA EL CUIDADO EN EL HOGAR Controle su afeccin para ver si hay cambios. Las siguientes indicaciones ayudarn a Chief Strategy Officer que pueda sentir:  Quarry manager.  Adopte una buena postura mientras est sentado y caminando.  Trabaje con un fisioterapeuta.  Haga ejercicios de estiramiento y de amplitud de movimiento en su casa, como se lo haya indicado el fisioterapeuta.  Trabaje con un terapeuta ocupacional. Este tipo de mdico puede ayudarlo a funcionar Product manager y en el [place].  Use un dispositivo ortopdico para prevenir las [employer], como se lo indic el mdico.  Reciba masajes en los msculos afectados.  Si se lo indican, aplique hielo en la zona muscular afectada:  Ponga el hielo en una bolsa plstica.  Coloque una [employer] piel y la bolsa de hielo, o entre el dispositivo ortopdico y Therapist, nutritional.  Deje el hielo durante 57minutos, 2 a 3veces por da. SOLICITE ATENCIN MDICA SI:  Los espasmos musculares empeoran.  Aparecen otros sntomas junto con la espasticidad.  Tiene fiebre o siente escalofros.  Tiene dificultades para orinar o experimenta una sensacin de ardor cuando orina.  Tiene estreimiento.  Necesita ms apoyo en su hogar. SOLICITE ATENCIN MDICA DE INMEDIATO SI:  Tiene dificultad para respirar.  Tiene un espasmo muscular que lo deja congelado en una posicin dolorosa.  No puede caminar.  Si no puede cuidarse a s mismo en  su casa. Esta informacin no tiene Marine scientist el consejo del mdico. Asegrese de hacerle al mdico cualquier pregunta que tenga. Document Released: 05/12/2008 Document Revised: 04/18/2011 Document Reviewed: 04/09/2013 Elsevier Interactive Patient Education  2017 [employer].

## 2016-03-22 NOTE — Patient Outreach (Signed)
First telephone outreach attempt to patient to obtain mRS. Called patient using [employer] because chart indicated an interpretor was needed. Patient was not at home. Will call back tomorrow when patient will be home.   Jacqulynn Cadet  [facility] Care Management Assistant

## 2016-03-22 NOTE — Progress Notes (Signed)
Subjective:  Patient ID: James Cortez, male    DOB: [birth year]  Age: 51 y.o. MRN: [id number]  CC: Hand Pain (right)  HPI James Cortez presents for f/u for hand pain. 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 He also underwent an endovascular revascularization and had a loop recorder implanted during his hospitalization. He does have some residual aphasia d/t the stroke. He is not yet driving and is applying for disability.   He continues to complain in the right hand, wrist, elbow and shoulder. He has an underlying Dupuytren's contracture in his right hand has has had surgery on the hand in the past. He rates the pain an 8/10 with movement, 0/10 at rest and describes the pain as throbbing. He has seen PT and OT in the past but continues to see rehab medicine. He is trying to use arm normally but is limited d/t pain. Previously he was prescribed gabapentin but felt it changed his mood. He was switched to Voltaren gel but was unable to fill it d/t cost. He has not tried any otc medications.   He needs refills on his medications. He is scheduled to see neurology in May and rehab medicine in February. He is not currently followed by cardiology.   Past Medical History:  Diagnosis Date  . Skin cancer     Past Surgical History:  Procedure Laterality Date  . EP IMPLANTABLE DEVICE N/A 12/15/2015   Procedure: Loop Recorder Insertion;  Surgeon: Will Meredith Leeds, MD;  Location: [place] [facility];  Service: Cardiovascular;  Laterality: N/A;  . IR GENERIC HISTORICAL  12/08/2015   IR PERCUTANEOUS ART THROMBECTOMY/INFUSION INTRACRANIAL INC DIAG ANGIO 12/08/2015 Luanne Bras, MD [facility]INTER[facility] RAD  . RADIOLOGY WITH ANESTHESIA Right 12/08/2015   Procedure: RADIOLOGY WITH ANESTHESIA - CODE STROKE;  Surgeon: Medication Radiologist, MD;  Location: [place];  Service: Radiology;  Laterality: Right;  . SKIN SURGERY    . TEE WITHOUT CARDIOVERSION N/A  12/15/2015   Procedure: TRANSESOPHAGEAL ECHOCARDIOGRAM (TEE);  Surgeon: Sueanne Margarita, MD;  Location: [facility] ENDOSCOPY;  Service: Cardiovascular;  Laterality: N/A;   Family History  Problem Relation Age of Onset  . Family history unknown: Yes   Social History  Substance Use Topics  . Smoking status: Former Smoker    Packs/day: 0.00  . Smokeless tobacco: Never Used     Comment: before his stroke  . Alcohol use Yes     Comment: rare   ROS Review of Systems  Constitutional: Negative.   HENT: Negative.   Eyes: Negative.   Respiratory: Negative.   Cardiovascular: Negative.   Gastrointestinal: Negative.   Endocrine: Negative.   Genitourinary: Negative.   Musculoskeletal: Positive for arthralgias, gait problem and joint swelling. Negative for back pain, myalgias, neck pain and neck stiffness.  Skin: Negative.   Neurological: Positive for speech difficulty. Negative for dizziness, syncope, light-headedness, numbness and headaches.  Psychiatric/Behavioral: Negative.     Objective:   Today's Vitals: BP 115/74 (BP Location: Right Arm, Patient Position: Sitting, Cuff Size: Small)   Pulse 72   Temp 97.9 F (36.6 C) (Oral)   Ht 5\' 7"  (1.702 m)   Wt 173 lb 9.6 oz (78.7 kg)   SpO2 97%   BMI 27.19 kg/m   Physical Exam  Constitutional: He is oriented to person, place, and time. He appears well-developed and well-nourished.  HENT:  Head: Normocephalic and atraumatic.  Right Ear: External ear normal.  Left  Ear: External ear normal.  Eyes: Pupils are equal, round, and reactive to light. Right eye exhibits no discharge. Left eye exhibits no discharge.  Neck: Normal range of motion. Neck supple. No JVD present.  Cardiovascular: Normal rate, regular rhythm and normal heart sounds.   Pulmonary/Chest: Effort normal and breath sounds normal.  Abdominal: Soft. He exhibits no distension. There is no tenderness.  Musculoskeletal: He exhibits no edema or tenderness.  Right hand contracture grade  I index finger and thumb. Limited ROM in right shoulder, elbow, wrist  Neurological: He is alert and oriented to person, place, and time.  Skin: Skin is warm and dry.  Psychiatric: He has a normal mood and affect. His behavior is normal. Thought content normal.    Assessment & Plan:   1. 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 He is not currently in PT or OT but is followed by Rehab medicine. Continuing to exercise and use this arm may help with the weakness and atrophy post-stroke.   2. Dupuytren's contracture of right hand- this appears stable and is a chronic problem he had prior to the stroke. He would benefit from a visit to the hand surgeon but due to lack of insurance cannot afford one.   3. Left MCA stroke- Has not yet seen neurology or cardiology but does have the implanted loop recorder. Is stable on plavis, aspirin, and atorvastatin.  - aspirin 325 MG EC tablet- Take 1 tablet (325 mg total) by mouth daily.  - atorvastatin (LIPITOR) 20 MG tablet - Take 1 tablet (20 mg total) by mouth daily at 6 PM.  - clopidogrel (PLAVIX) 75 MG tablet- Take 1 tablet (75 mg total) by mouth daily with breakfast.   4. Right arm pain- This is likely r/t your stroke and spasticity. Because of your use of plavix, we will avoid the use of systemic nsaids. We will refill the Voltaren gel at the [facility] which may be lower in cost and start him on baclofen.  - baclofen (LIORESAL) 10 MG tablet- Take 1 tablet (10 mg total) by mouth 3 (three) times daily. - diclofenac sodium (VOLTAREN) 1 % GEL- Apply 2 g topically 4 (four) times daily.  Outpatient Encounter Prescriptions as of 03/22/2016  Medication Sig  . aspirin 325 MG EC tablet Take 1 tablet (325 mg total) by mouth daily.  Marland Kitchen atorvastatin (LIPITOR) 20 MG tablet Take 1 tablet (20 mg total) by mouth daily at 6 PM.  . clopidogrel (PLAVIX) 75 MG tablet  Take 1 tablet (75 mg total) by mouth daily with breakfast.  . [DISCONTINUED] aspirin EC 325 MG EC tablet Take 1 tablet (325 mg total) by mouth daily.  . baclofen (LIORESAL) 10 MG tablet Take 1 tablet (10 mg total) by mouth 3 (three) times daily.  . diclofenac sodium (VOLTAREN) 1 % GEL Apply 2 g topically 4 (four) times daily.  Marland Kitchen gabapentin (NEURONTIN) 300 MG capsule Take 1 capsule (300 mg total) by mouth 2 (two) times daily. (Patient not taking: Reported on 03/22/2016)  . OVER THE COUNTER MEDICATION Place 1-2 drops into both eyes daily as needed (eye irritation).  . [DISCONTINUED] diclofenac sodium (VOLTAREN) 1 % GEL Apply 2 g topically 4 (four) times daily. (Patient not taking: Reported on 02/18/2016)  . [DISCONTINUED] predniSONE (DELTASONE) 20 MG tablet Take 1 tablet (20 mg total) by mouth 2 (two) times daily with a meal.  No facility-administered encounter medications on file as of 03/22/2016.     Follow-up:       Beckey Rutter, AGNP Student

## 2016-03-23 ENCOUNTER — Other Ambulatory Visit: Payer: Self-pay

## 2016-03-23 NOTE — Patient Outreach (Signed)
Second telephone outreach attempt to patient to obtain mRS with the assistance of [employer]. No answer, left message for return call. Will try again.   Jacqulynn Cadet  [facility] Care Management Assistant

## 2016-03-23 NOTE — Patient Outreach (Signed)
Patient returned telephone call and was able to communicate clearly without translator. Was able to obtain mRS was successfully completed. mRS = 2  Josepha Pigg, [place] Management Assistant

## 2016-03-28 NOTE — Patient Outreach (Signed)
[facility]) Care Management  03/28/2016  James Cortez 10/17/1965 [id number]   Follow up letter sent from last phone call with patient who indicated financial hardship with upcoming MD appointments. Mailed financial assistance application to patient.   Jacqulynn Cadet  [facility] Care Management Assistant

## 2016-03-29 LAB — CUP PACEART REMOTE DEVICE CHECK
Implantable Pulse Generator Implant Date: 20171107
MDC IDC SESS DTM: 20180106191109

## 2016-03-31 ENCOUNTER — Encounter: Payer: Self-pay | Attending: Physical Medicine & Rehabilitation

## 2016-03-31 ENCOUNTER — Encounter: Payer: Self-pay | Admitting: Physical Medicine & Rehabilitation

## 2016-03-31 ENCOUNTER — Ambulatory Visit (HOSPITAL_BASED_OUTPATIENT_CLINIC_OR_DEPARTMENT_OTHER): Payer: Self-pay | Admitting: Physical Medicine & Rehabilitation

## 2016-03-31 VITALS — BP 110/75 | HR 72 | Resp 14

## 2016-03-31 DIAGNOSIS — R1310 Dysphagia, unspecified: Secondary | ICD-10-CM | POA: Insufficient documentation

## 2016-03-31 DIAGNOSIS — Z5189 Encounter for other specified aftercare: Secondary | ICD-10-CM | POA: Insufficient documentation

## 2016-03-31 DIAGNOSIS — Z955 Presence of coronary angioplasty implant and graft: Secondary | ICD-10-CM | POA: Insufficient documentation

## 2016-03-31 DIAGNOSIS — R269 Unspecified abnormalities of gait and mobility: Secondary | ICD-10-CM

## 2016-03-31 DIAGNOSIS — I69391 Dysphagia following cerebral infarction: Secondary | ICD-10-CM | POA: Insufficient documentation

## 2016-03-31 DIAGNOSIS — I69320 Aphasia following cerebral infarction: Secondary | ICD-10-CM

## 2016-03-31 DIAGNOSIS — I69398 Other sequelae of cerebral infarction: Secondary | ICD-10-CM

## 2016-03-31 DIAGNOSIS — R52 Pain, unspecified: Secondary | ICD-10-CM | POA: Insufficient documentation

## 2016-03-31 DIAGNOSIS — I69351 Hemiplegia and hemiparesis following cerebral infarction affecting right dominant side: Secondary | ICD-10-CM

## 2016-03-31 DIAGNOSIS — Z9889 Other specified postprocedural states: Secondary | ICD-10-CM | POA: Insufficient documentation

## 2016-03-31 DIAGNOSIS — I69354 Hemiplegia and hemiparesis following cerebral infarction affecting left non-dominant side: Secondary | ICD-10-CM | POA: Insufficient documentation

## 2016-03-31 DIAGNOSIS — I63312 Cerebral infarction due to thrombosis of left middle cerebral artery: Secondary | ICD-10-CM | POA: Insufficient documentation

## 2016-03-31 NOTE — Progress Notes (Signed)
Subjective:    Patient ID: James Cortez, male    DOB: 11-28-1965, 51 y.o.   MRN: [id number] Here with interpreter HPI 51 year old male status post left MCA dissipation infarct with residual right hemiparesis and aphasia, 12/08/2015, status post mechanical thrombectomy and left MCA stent. Remains independent with his self-care, dressing and mobility. Continues to receive outpatient speech therapy. Former job was as a Horticulturist, commercial. Still notes weakness in the right upper  Pain Inventory Average Pain 8 Pain Right Now 8 My pain is constant, dull and aching  In the last 24 hours, has pain interfered with the following? General activity 8 Relation with others 8 Enjoyment of life 8 What TIME of day is your pain at its worst? night Sleep (in general) Good  Pain is worse with: some activites Pain improves with: nothing Relief from Meds: 0  Mobility walk without assistance  Function disabled: date disabled .  Neuro/Psych spasms  Prior Studies Any changes since last visit?  no  Physicians involved in your care Any changes since last visit?  no   Family History  Problem Relation Age of Onset  . Family history unknown: Yes   Social History   Social History  . Marital status: Married    Spouse name: N/A  . Number of children: N/A  . Years of education: N/A   Social History Main Topics  . Smoking status: Former Smoker    Packs/day: 0.00  . Smokeless tobacco: Never Used     Comment: before his stroke  . Alcohol use Yes     Comment: rare  . Drug use: No  . Sexual activity: Not Asked   Other Topics Concern  . None   Social History Narrative  . None   Past Surgical History:  Procedure Laterality Date  . EP IMPLANTABLE DEVICE N/A 12/15/2015   Procedure: Loop Recorder Insertion;  Surgeon: Will Meredith Leeds, MD;  Location: [place] [facility];  Service: Cardiovascular;  Laterality: N/A;  . IR GENERIC HISTORICAL  12/08/2015   IR PERCUTANEOUS ART  THROMBECTOMY/INFUSION INTRACRANIAL INC DIAG ANGIO 12/08/2015 Luanne Bras, MD [facility]  . RADIOLOGY WITH ANESTHESIA Right 12/08/2015   Procedure: RADIOLOGY WITH ANESTHESIA - CODE STROKE;  Surgeon: Medication Radiologist, MD;  Location: [place];  Service: Radiology;  Laterality: Right;  . SKIN SURGERY    . TEE WITHOUT CARDIOVERSION N/A 12/15/2015   Procedure: TRANSESOPHAGEAL ECHOCARDIOGRAM (TEE);  Surgeon: Sueanne Margarita, MD;  [facility] ENDOSCOPY;  Service: Cardiovascular;  Laterality: N/A;   Past Medical History:  Diagnosis Date  . Skin cancer    BP 110/75   Pulse 72   Resp 14   SpO2 95%   Opioid Risk Score:   Fall Risk Score:  `1  Depression screen PHQ 2/9  Depression screen [facility] 2/9 02/18/2016 01/13/2016  Decreased Interest 0 3  Down, Depressed, Hopeless 0 0  PHQ - 2 Score 0 3  Altered sleeping - 0  Tired, decreased energy - 0  Change in appetite - 0  Feeling bad or failure about yourself  - 0  Trouble concentrating - 3  Moving slowly or fidgety/restless - 3  Suicidal thoughts - 0  PHQ-9 Score - 9    Review of Systems  Constitutional: Negative.   HENT: Negative.   Eyes: Negative.   Respiratory: Negative.   Cardiovascular: Negative.   Gastrointestinal: Negative.   Endocrine: Negative.   Genitourinary: Negative.   Musculoskeletal: Positive for arthralgias.       Spasms  Skin: Negative.   Allergic/Immunologic: Negative.   Neurological: Negative.   Hematological: Negative.   Psychiatric/Behavioral: Negative.   All other systems reviewed and are negative.      Objective:   Physical Exam  Constitutional: He is oriented to person, place, and time. He appears well-developed and well-nourished.  HENT:  Head: Normocephalic and atraumatic.  Eyes: Conjunctivae and EOM are normal. Pupils are equal, round, and reactive to light.  Neck: Normal range of motion.  Visual files intact to confrontation testing  Musculoskeletal:       Right hand: He exhibits  decreased range of motion. Decreased strength noted. He exhibits thumb/finger opposition.       Hands: Neurological: He is alert and oriented to person, place, and time. He displays no atrophy. No sensory deficit. Coordination abnormal. Gait normal.  Reduce fine motor Finger to thumb opposisiton  Her strength is 4/5 in the right deltoid, biceps, triceps, grip 5/5 bilateral hip flexors, knee extensors, ankle dorsi flexion 5/5 left deltoid, biceps, triceps, grip There is decreased coordination, dysdiadochokinesis with rapid alternating supination and pronation of the right upper limb  Skin: Skin is warm and dry.  Psychiatric: He has a normal mood and affect. His behavior is normal.  Nursing note and vitals reviewed.         Assessment & Plan:  1. Left MCA infarct is a fascia is improving, he still has right upper extremity weakness and decreased coordination which would limit his work as a Horticulturist, commercial. He does not feel like he is training for any other type of job. He would be able to perform a sedentary or light duty job  He should be ready to drive with restrictions as noted Graduated return to driving instructions were provided. It is recommended that the patient first drives with another licensed driver in an empty parking lot. If the patient does well with this, and they can drive on a quiet street with the licensed driver. If the patient does well with this they can drive on a busy street with a licensed driver. If the patient does well with this, the next time out they can go by himself. For the first month after resuming driving, I recommend no nighttime or Interstate driving.   Follow-up in 2 months  Over half of the 25 min visit was spent counseling and coordinating care.

## 2016-03-31 NOTE — Patient Instructions (Signed)

## 2016-04-07 ENCOUNTER — Ambulatory Visit: Payer: Self-pay | Attending: Family Medicine

## 2016-04-10 LAB — CUP PACEART REMOTE DEVICE CHECK
Date Time Interrogation Session: 20180205221542
MDC IDC PG IMPLANT DT: 20171107

## 2016-04-10 NOTE — Progress Notes (Signed)
Carelink summary report received. Battery status OK. Normal device function. No new symptom episodes, tachy episodes, brady, or pause episodes. No new AF episodes. Monthly summary reports and ROV/PRN 

## 2016-04-13 ENCOUNTER — Ambulatory Visit (INDEPENDENT_AMBULATORY_CARE_PROVIDER_SITE_OTHER): Payer: Self-pay | Admitting: *Deleted

## 2016-04-13 DIAGNOSIS — I639 Cerebral infarction, unspecified: Secondary | ICD-10-CM

## 2016-04-14 NOTE — Progress Notes (Signed)
Carelink Summary Report / Loop Recorder 

## 2016-04-19 ENCOUNTER — Encounter: Payer: Self-pay | Admitting: Neurology

## 2016-04-22 ENCOUNTER — Ambulatory Visit: Payer: Self-pay | Attending: Family Medicine | Admitting: Family Medicine

## 2016-04-22 ENCOUNTER — Encounter: Payer: Self-pay | Admitting: Family Medicine

## 2016-04-22 VITALS — BP 138/82 | HR 75 | Temp 98.1°F | Ht 67.5 in | Wt 176.4 lb

## 2016-04-22 DIAGNOSIS — I63512 Cerebral infarction due to unspecified occlusion or stenosis of left middle cerebral artery: Secondary | ICD-10-CM

## 2016-04-22 DIAGNOSIS — Z7982 Long term (current) use of aspirin: Secondary | ICD-10-CM | POA: Insufficient documentation

## 2016-04-22 DIAGNOSIS — I10 Essential (primary) hypertension: Secondary | ICD-10-CM | POA: Insufficient documentation

## 2016-04-22 DIAGNOSIS — Z5189 Encounter for other specified aftercare: Secondary | ICD-10-CM | POA: Insufficient documentation

## 2016-04-22 DIAGNOSIS — G8929 Other chronic pain: Secondary | ICD-10-CM | POA: Insufficient documentation

## 2016-04-22 DIAGNOSIS — L299 Pruritus, unspecified: Secondary | ICD-10-CM | POA: Insufficient documentation

## 2016-04-22 DIAGNOSIS — I69320 Aphasia following cerebral infarction: Secondary | ICD-10-CM | POA: Insufficient documentation

## 2016-04-22 DIAGNOSIS — M79641 Pain in right hand: Secondary | ICD-10-CM | POA: Insufficient documentation

## 2016-04-22 DIAGNOSIS — I69351 Hemiplegia and hemiparesis following cerebral infarction affecting right dominant side: Secondary | ICD-10-CM | POA: Insufficient documentation

## 2016-04-22 DIAGNOSIS — Z85828 Personal history of other malignant neoplasm of skin: Secondary | ICD-10-CM | POA: Insufficient documentation

## 2016-04-22 DIAGNOSIS — G4489 Other headache syndrome: Secondary | ICD-10-CM | POA: Insufficient documentation

## 2016-04-22 DIAGNOSIS — I69392 Facial weakness following cerebral infarction: Secondary | ICD-10-CM | POA: Insufficient documentation

## 2016-04-22 LAB — COMPLETE METABOLIC PANEL WITH GFR
ALBUMIN: 4.3 g/dL (ref 3.6–5.1)
ALK PHOS: 75 U/L (ref 40–115)
ALT: 51 U/L — ABNORMAL HIGH (ref 9–46)
AST: 27 U/L (ref 10–35)
BILIRUBIN TOTAL: 0.4 mg/dL (ref 0.2–1.2)
BUN: 14 mg/dL (ref 7–25)
CALCIUM: 9.4 mg/dL (ref 8.6–10.3)
CO2: 22 mmol/L (ref 20–31)
Chloride: 104 mmol/L (ref 98–110)
Creat: 0.78 mg/dL (ref 0.70–1.33)
Glucose, Bld: 89 mg/dL (ref 65–99)
Potassium: 4.3 mmol/L (ref 3.5–5.3)
Sodium: 137 mmol/L (ref 135–146)
TOTAL PROTEIN: 7.2 g/dL (ref 6.1–8.1)

## 2016-04-22 MED ORDER — HYDROCORTISONE 0.5 % EX CREA: 1.0000 "application " | TOPICAL_CREAM | Freq: Two times a day (BID) | CUTANEOUS | 1 refills | Status: DC

## 2016-04-22 MED ORDER — ATORVASTATIN CALCIUM 20 MG PO TABS: 20.0000 mg | ORAL_TABLET | Freq: Every day | ORAL | 3 refills | Status: DC

## 2016-04-22 MED ORDER — BUTALBITAL-APAP-CAFFEINE 50-325-40 MG PO TABS: 1.0000 | ORAL_TABLET | Freq: Two times a day (BID) | ORAL | 0 refills | Status: DC | PRN

## 2016-04-22 MED ORDER — DICLOFENAC SODIUM 1 % TD GEL: 2.0000 g | Freq: Four times a day (QID) | TRANSDERMAL | 2 refills | Status: DC

## 2016-04-22 MED ORDER — GABAPENTIN 300 MG PO CAPS: 300.0000 mg | ORAL_CAPSULE | Freq: Two times a day (BID) | ORAL | 3 refills | Status: DC

## 2016-04-22 MED ORDER — CLOPIDOGREL BISULFATE 75 MG PO TABS: 75.0000 mg | ORAL_TABLET | Freq: Every day | ORAL | 3 refills | Status: DC

## 2016-04-22 MED ORDER — SELENIUM SULFIDE 2.25 % EX SHAM: 1.0000 "application " | MEDICATED_SHAMPOO | CUTANEOUS | 0 refills | Status: DC

## 2016-04-22 MED ORDER — BACLOFEN 10 MG PO TABS: 10.0000 mg | ORAL_TABLET | Freq: Three times a day (TID) | ORAL | 3 refills | Status: DC

## 2016-04-22 MED FILL — GABAPENTIN 300 MG CAPSULE: 300 | 30 days supply | Qty: 60 | Fill #0

## 2016-04-22 MED FILL — BACLOFEN 10 MG TABLET: 10 | 30 days supply | Qty: 90 | Fill #0

## 2016-04-22 MED FILL — CLOPIDOGREL 75 MG TABLET: 75 | 30 days supply | Qty: 30 | Fill #0

## 2016-04-22 MED FILL — VOLTAREN 1% GEL: 1 | 12 days supply | Qty: 100 | Fill #0

## 2016-04-22 MED FILL — ?ATORVASTATIN 20 MG TABLET: 20 | 30 days supply | Qty: 30 | Fill #0

## 2016-04-22 NOTE — Progress Notes (Signed)
Subjective:  Patient ID: James Cortez, male    DOB: Jun 26, 1965  Age: 51 y.o. MRN: [id number]  CC: Headache (4 days) and "itching all over face and head"   HPI James Cortez is a 51 year old male with left middle cerebral artery stroke in 12/2015 (with initial presentation of aphasia, right facial droop and right hemiparesis) who received TPA with improvement in stroke score s/p loop recorder placement here for follow-up visit.  He complains of four-day history of occipital headaches which are worse when he lies down flat; denies nasal congestion, sinus tenderness, postnasal drip or fevers. Denies nausea or vomiting. He also complains of pruritus of his face which he has had chronically, before his stroke but also has pruritus of his scalp and has noticed a nodule on his posterior scalp which is pruritic. Denies the presence of any rash on his face.  He has had right hand pain which is rated at an 8/10 and is worse with movement. Pain is described as throbbing and he denies dropping things. He has noticed swelling of his right hand and decreased ability to flex his index finger ever since he try to exercise at home; he does have an underlying Dupuytren's contracture in his right hand and has had surgery his left hand in the past He does have some mild residual aphasia but has no visual complaints. Not undergoing physical, occupational or speech therapy due to lack of medical coverage.     Past Medical History:  Diagnosis Date  . Skin cancer     Past Surgical History:  Procedure Laterality Date  . EP IMPLANTABLE DEVICE N/A 12/15/2015   Procedure: Loop Recorder Insertion;  Surgeon: Will Meredith Leeds, MD;  Location: [facility];  Service: Cardiovascular;  Laterality: N/A;  . IR GENERIC HISTORICAL  12/08/2015   IR PERCUTANEOUS ART THROMBECTOMY/INFUSION INTRACRANIAL INC DIAG ANGIO 12/08/2015 Luanne Bras, MD [facility]  . RADIOLOGY WITH ANESTHESIA Right 12/08/2015   Procedure: RADIOLOGY WITH ANESTHESIA - CODE STROKE;  Surgeon: Medication Radiologist, MD;  Location: [facility];  Service: Radiology;  Laterality: Right;  . SKIN SURGERY    . TEE WITHOUT CARDIOVERSION N/A 12/15/2015   Procedure: TRANSESOPHAGEAL ECHOCARDIOGRAM (TEE);  Surgeon: Sueanne Margarita, MD;  Location[facility];  Service: Cardiovascular;  Laterality: N/A;    No Known Allergies   Outpatient Medications Prior to Visit  Medication Sig Dispense Refill  . aspirin 325 MG EC tablet Take 1 tablet (325 mg total) by mouth daily. 30 tablet 2  . atorvastatin (LIPITOR) 20 MG tablet Take 1 tablet (20 mg total) by mouth daily at 6 PM. 30 tablet 2  . baclofen (LIORESAL) 10 MG tablet Take 1 tablet (10 mg total) by mouth 3 (three) times daily. 90 tablet 3  . clopidogrel (PLAVIX) 75 MG tablet Take 1 tablet (75 mg total) by mouth daily with breakfast. 30 tablet 2  . OVER THE COUNTER MEDICATION Place 1-2 drops into both eyes daily as needed (eye irritation).    Marland Kitchen diclofenac sodium (VOLTAREN) 1 % GEL Apply 2 g topically 4 (four) times daily. (Patient not taking: Reported on 04/22/2016) 3 Tube 2  . gabapentin (NEURONTIN) 300 MG capsule Take 1 capsule (300 mg total) by mouth 2 (two) times daily. (Patient not taking: Reported on 04/22/2016) 60 capsule 3   No facility-administered medications prior to visit.     ROS Review of Systems  Constitutional: Negative for activity change and appetite change.  HENT: Negative for sinus pressure and sore throat.  Respiratory: Negative for chest tightness, shortness of breath and wheezing.   Cardiovascular: Negative for chest pain and palpitations.  Gastrointestinal: Negative for abdominal distention, abdominal pain and constipation.  Genitourinary: Negative.   Musculoskeletal: Negative.   Skin: Positive for rash.       Pruritus  Neurological: Positive for headaches.  Psychiatric/Behavioral: Negative for behavioral problems and dysphoric mood.    Objective:  BP  138/82 (BP Location: Right Arm, Patient Position: Sitting, Cuff Size: Small)   Pulse 75   Temp 98.1 F (36.7 C) (Oral)   Ht 5' 7.5" (1.715 m)   Wt 176 lb 6.4 oz (80 kg)   BMI 27.22 kg/m   BP/Weight 04/22/2016 03/31/2016 5/40/9811  Systolic BP 914 782 956  Diastolic BP 82 75 74  Wt. (Lbs) 176.4 - 173.6  BMI 27.22 - 27.19      Physical Exam  Constitutional: He is oriented to person, place, and time. He appears well-developed and well-nourished.  HENT:  Right Ear: External ear normal.  Left Ear: External ear normal.  Cardiovascular: Normal rate, normal heart sounds and intact distal pulses.   No murmur heard. Pulmonary/Chest: Effort normal and breath sounds normal. He has no wheezes. He has no rales. He exhibits no tenderness.  Loop recorder in left upper chest wall  Abdominal: Soft. Bowel sounds are normal. He exhibits no distension and no mass. There is no tenderness.  Musculoskeletal: He exhibits edema (slight edema of the right hand and MCP joints).  Duputyren's contractures in right hand unable to make a fist in right hand.  Neurological: He is alert and oriented to person, place, and time.  Skin:  No rash evident face Nodule on left posterior aspect of scalp which is nontender     Assessment & Plan:   1. Left middle cerebral artery stroke (HCC) Risk factor modification - clopidogrel (PLAVIX) 75 MG tablet; Take 1 tablet (75 mg total) by mouth daily with breakfast.  Dispense: 30 tablet; Refill: 3 - atorvastatin (LIPITOR) 20 MG tablet; Take 1 tablet (20 mg total) by mouth daily at 6 PM.  Dispense: 30 tablet; Refill: 3  2. Essential hypertension Introl - COMPLETE METABOLIC PANEL WITH GFR  3. Other headache syndrome Of acute onset If symptoms persist will consider posttraumatic headache. - butalbital-acetaminophen-caffeine (FIORICET, ESGIC) 50-325-40 MG tablet; Take 1-2 tablets by mouth every 12 (twelve) hours as needed for headache.  Dispense: 30 tablet; Refill:  0  4. Pruritus Unknown etiology Will use hydrocortisone for the face and selenium sulfide as presumptive treatment of seborrheic dermatitis - hydrocortisone cream 0.5 %; Apply 1 application topically 2 (two) times daily. To the face  Dispense: 30 g; Refill: 1 - Selenium Sulfide 2.25 % SHAM; Apply 1 application topically 2 (two) times a week.  Dispense: 180 mL; Refill: 0  5. Hemiparesis affecting right side as late effect of cerebrovascular accident (CVA) ([place]) He has chronic pain in his right upper extremity - gabapentin (NEURONTIN) 300 MG capsule; Take 1 capsule (300 mg total) by mouth 2 (two) times daily.  Dispense: 60 capsule; Refill: 3 - baclofen (LIORESAL) 10 MG tablet; Take 1 tablet (10 mg total) by mouth 3 (three) times daily.  Dispense: 90 tablet; Refill: 3 - diclofenac sodium (VOLTAREN) 1 % GEL; Apply 2 g topically 4 (four) times daily.  Dispense: 3 Tube; Refill: 2   Meds ordered this encounter  Medications  . clopidogrel (PLAVIX) 75 MG tablet    Sig: Take 1 tablet (75 mg total) by mouth daily with  breakfast.    Dispense:  30 tablet    Refill:  3  . atorvastatin (LIPITOR) 20 MG tablet    Sig: Take 1 tablet (20 mg total) by mouth daily at 6 PM.    Dispense:  30 tablet    Refill:  3  . gabapentin (NEURONTIN) 300 MG capsule    Sig: Take 1 capsule (300 mg total) by mouth 2 (two) times daily.    Dispense:  60 capsule    Refill:  3  . baclofen (LIORESAL) 10 MG tablet    Sig: Take 1 tablet (10 mg total) by mouth 3 (three) times daily.    Dispense:  90 tablet    Refill:  3  . butalbital-acetaminophen-caffeine (FIORICET, ESGIC) 50-325-40 MG tablet    Sig: Take 1-2 tablets by mouth every 12 (twelve) hours as needed for headache.    Dispense:  30 tablet    Refill:  0  . hydrocortisone cream 0.5 %    Sig: Apply 1 application topically 2 (two) times daily. To the face    Dispense:  30 g    Refill:  1  . Selenium Sulfide 2.25 % SHAM    Sig: Apply 1 application topically 2 (two)  times a week.    Dispense:  180 mL    Refill:  0  . diclofenac sodium (VOLTAREN) 1 % GEL    Sig: Apply 2 g topically 4 (four) times daily.    Dispense:  3 Tube    Refill:  2    Follow-up: Return in about 3 months (around 07/23/2016) for Follow-up on chronic medical conditions.   Arnoldo Morale MD

## 2016-04-22 NOTE — Progress Notes (Signed)
Med refill- plavix, gabapentin

## 2016-04-26 NOTE — Patient Outreach (Signed)
[facility] Care Management  04/26/2016  James Cortez 17-Sep-1965 [id number]   Patient was mailed Spanish copies of the following:  [place] application and letter with specific documents needed for this process.  I have requested he call me with any questions.  An interpreter is not needed for verbal consults, as patient voices understanding.  Kenney Houseman, [facility] [phone]

## 2016-04-29 ENCOUNTER — Emergency Department (HOSPITAL_COMMUNITY): Payer: No Typology Code available for payment source

## 2016-04-29 ENCOUNTER — Emergency Department (HOSPITAL_COMMUNITY)
Admission: EM | Admit: 2016-04-29 | Discharge: 2016-04-29 | Disposition: A | Discharge status: Home / Self Care | Payer: No Typology Code available for payment source | Attending: Emergency Medicine | Admitting: Emergency Medicine

## 2016-04-29 DIAGNOSIS — Z79899 Other long term (current) drug therapy: Secondary | ICD-10-CM | POA: Insufficient documentation

## 2016-04-29 DIAGNOSIS — Y92410 Unspecified street and highway as the place of occurrence of the external cause: Secondary | ICD-10-CM | POA: Insufficient documentation

## 2016-04-29 DIAGNOSIS — I10 Essential (primary) hypertension: Secondary | ICD-10-CM | POA: Insufficient documentation

## 2016-04-29 DIAGNOSIS — Z87891 Personal history of nicotine dependence: Secondary | ICD-10-CM | POA: Insufficient documentation

## 2016-04-29 DIAGNOSIS — R079 Chest pain, unspecified: Secondary | ICD-10-CM | POA: Insufficient documentation

## 2016-04-29 DIAGNOSIS — R930 Abnormal findings on diagnostic imaging of skull and head, not elsewhere classified: Secondary | ICD-10-CM | POA: Insufficient documentation

## 2016-04-29 DIAGNOSIS — S29001A Unspecified injury of muscle and tendon of front wall of thorax, initial encounter: Secondary | ICD-10-CM | POA: Diagnosis present

## 2016-04-29 DIAGNOSIS — S20212A Contusion of left front wall of thorax, initial encounter: Secondary | ICD-10-CM | POA: Diagnosis not present

## 2016-04-29 DIAGNOSIS — Y999 Unspecified external cause status: Secondary | ICD-10-CM | POA: Diagnosis not present

## 2016-04-29 DIAGNOSIS — R109 Unspecified abdominal pain: Secondary | ICD-10-CM | POA: Insufficient documentation

## 2016-04-29 DIAGNOSIS — S301XXA Contusion of abdominal wall, initial encounter: Secondary | ICD-10-CM | POA: Insufficient documentation

## 2016-04-29 DIAGNOSIS — Y939 Activity, unspecified: Secondary | ICD-10-CM | POA: Diagnosis not present

## 2016-04-29 DIAGNOSIS — Z8673 Personal history of transient ischemic attack (TIA), and cerebral infarction without residual deficits: Secondary | ICD-10-CM | POA: Insufficient documentation

## 2016-04-29 HISTORY — DX: Cerebral infarction, unspecified: I63.9

## 2016-04-29 HISTORY — DX: Essential (primary) hypertension: I10

## 2016-04-29 LAB — I-STAT CHEM 8, ED
BUN: 21 mg/dL — ABNORMAL HIGH (ref 6–20)
CALCIUM ION: 1.16 mmol/L (ref 1.15–1.40)
Chloride: 106 mmol/L (ref 101–111)
Creatinine, Ser: 0.8 mg/dL (ref 0.61–1.24)
GLUCOSE: 96 mg/dL (ref 65–99)
HCT: 44 % (ref 39.0–52.0)
HEMOGLOBIN: 15 g/dL (ref 13.0–17.0)
Potassium: 4 mmol/L (ref 3.5–5.1)
SODIUM: 140 mmol/L (ref 135–145)
TCO2: 27 mmol/L (ref 0–100)

## 2016-04-29 LAB — COMPREHENSIVE METABOLIC PANEL
ALBUMIN: 3.7 g/dL (ref 3.5–5.0)
ALT: 33 U/L (ref 17–63)
ANION GAP: 9 (ref 5–15)
AST: 27 U/L (ref 15–41)
Alkaline Phosphatase: 72 U/L (ref 38–126)
BUN: 16 mg/dL (ref 6–20)
CHLORIDE: 105 mmol/L (ref 101–111)
CO2: 24 mmol/L (ref 22–32)
Calcium: 9.1 mg/dL (ref 8.9–10.3)
Creatinine, Ser: 0.85 mg/dL (ref 0.61–1.24)
GFR calc non Af Amer: >60 mL/min (ref >60)
Glucose, Bld: 99 mg/dL (ref 65–99)
POTASSIUM: 4 mmol/L (ref 3.5–5.1)
SODIUM: 138 mmol/L (ref 135–145)
Total Bilirubin: 0.4 mg/dL (ref 0.3–1.2)
Total Protein: 6.3 g/dL — ABNORMAL LOW (ref 6.5–8.1)

## 2016-04-29 LAB — CUP PACEART REMOTE DEVICE CHECK
Date Time Interrogation Session: 20180307191656
Implantable Pulse Generator Implant Date: 20171107

## 2016-04-29 LAB — URINALYSIS, ROUTINE W REFLEX MICROSCOPIC
Bilirubin Urine: NEGATIVE
Glucose, UA: NEGATIVE mg/dL
Hgb urine dipstick: NEGATIVE
Ketones, ur: NEGATIVE mg/dL
LEUKOCYTES UA: NEGATIVE
Nitrite: NEGATIVE
PH: 7 (ref 5.0–8.0)
PROTEIN: NEGATIVE mg/dL
SPECIFIC GRAVITY, URINE: 1.03 (ref 1.005–1.030)

## 2016-04-29 LAB — CBC
HEMATOCRIT: 44.6 % (ref 39.0–52.0)
HEMOGLOBIN: 15.3 g/dL (ref 13.0–17.0)
MCH: 30.2 pg (ref 26.0–34.0)
MCHC: 34.3 g/dL (ref 30.0–36.0)
MCV: 88 fL (ref 78.0–100.0)
Platelets: 280 10*3/uL (ref 150–400)
RBC: 5.07 MIL/uL (ref 4.22–5.81)
RDW: 13 % (ref 11.5–15.5)
WBC: 11.2 10*3/uL — ABNORMAL HIGH (ref 4.0–10.5)

## 2016-04-29 LAB — TROPONIN I

## 2016-04-29 LAB — PROTIME-INR
INR: 1.01
Prothrombin Time: 13.3 seconds (ref 11.4–15.2)

## 2016-04-29 LAB — ETHANOL

## 2016-04-29 MED ORDER — ACETAMINOPHEN 500 MG PO TABS: 1000.0000 mg | ORAL_TABLET | Freq: Three times a day (TID) | ORAL | 0 refills | Status: DC

## 2016-04-29 MED ORDER — SODIUM CHLORIDE 0.9 % IV BOLUS (SEPSIS)
1000.0000 mL | Freq: Once | INTRAVENOUS | Status: AC
  Administered 2016-04-29: 1000 mL via INTRAVENOUS

## 2016-04-29 MED ORDER — IOPAMIDOL (ISOVUE-300) INJECTION 61%
INTRAVENOUS | Status: AC
  Administered 2016-04-29: 100 mL
  Filled 2016-04-29: qty 100

## 2016-04-29 MED ORDER — ACETAMINOPHEN 500 MG PO TABS
1000.0000 mg | ORAL_TABLET | Freq: Once | ORAL | Status: AC
  Administered 2016-04-29: 1000 mg via ORAL
  Filled 2016-04-29: qty 2

## 2016-04-29 NOTE — Discharge Instructions (Signed)
During your work up, we noted some incidental findings that will require you to follow up with your Primary Care Provider for further assessment and evaluation:  7 mm right lower lobe pulmonary nodule. Non-contrast chest CT at  6-12 months is recommended. If the nodule is stable at time of  repeat CT, then future CT at 18-24 months (from today's scan) is  considered optional for low-risk patients, but is recommended for  high-risk patients. This recommendation follows the consensus  statement: Guidelines for Management of Incidental Pulmonary Nodules  Detected on CT Images: From the Fleischner Society 2017; Radiology  2017; 284:228-243.     Prominent right hilar lymph nodes are nonspecific in etiology. These  may be secondary to an infectious or inflammatory process. Recommend  follow-up CT in 3 months to assess for stability.

## 2016-04-29 NOTE — ED Notes (Signed)
Pt now complaining of R hip pain.

## 2016-04-29 NOTE — ED Notes (Signed)
Pt left at this time with all belongings. Refused wheelchair. 

## 2016-04-29 NOTE — ED Triage Notes (Signed)
Pt reports hx of stroke recently and takes blood thinners because of this. Some residual speech changes. Primarily spanish speaking.
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
## 2016-05-09 ENCOUNTER — Telehealth: Payer: Self-pay

## 2016-05-09 NOTE — Telephone Encounter (Signed)
-----   Message from Arnoldo Morale, MD sent at 04/25/2016  9:01 AM EDT ----- Please inform the patient that labs are normal. Thank you.

## 2016-05-09 NOTE — Telephone Encounter (Signed)
Writer called patient through [employer] and was able to discuss his lab results.  Patient stated understanding.

## 2016-05-13 ENCOUNTER — Ambulatory Visit (INDEPENDENT_AMBULATORY_CARE_PROVIDER_SITE_OTHER): Payer: Self-pay | Admitting: *Deleted

## 2016-05-13 DIAGNOSIS — I639 Cerebral infarction, unspecified: Secondary | ICD-10-CM

## 2016-05-16 NOTE — Progress Notes (Signed)
Carelink Summary Report / Loop Recorder 

## 2016-05-22 LAB — CUP PACEART REMOTE DEVICE CHECK
Implantable Pulse Generator Implant Date: 20171107
MDC IDC SESS DTM: 20180406194129

## 2016-05-22 NOTE — Progress Notes (Signed)
Carelink summary report received. Battery status OK. Normal device function. No new symptom episodes, tachy episodes, brady, or pause episodes. No new AF episodes. Monthly summary reports and ROV/PRN 

## 2016-05-30 ENCOUNTER — Ambulatory Visit (HOSPITAL_BASED_OUTPATIENT_CLINIC_OR_DEPARTMENT_OTHER): Payer: Self-pay | Admitting: Physical Medicine & Rehabilitation

## 2016-05-30 ENCOUNTER — Encounter: Payer: Self-pay | Attending: Physical Medicine & Rehabilitation

## 2016-05-30 ENCOUNTER — Encounter: Payer: Self-pay | Admitting: Physical Medicine & Rehabilitation

## 2016-05-30 VITALS — BP 117/75 | HR 61

## 2016-05-30 DIAGNOSIS — I63312 Cerebral infarction due to thrombosis of left middle cerebral artery: Secondary | ICD-10-CM | POA: Insufficient documentation

## 2016-05-30 DIAGNOSIS — R1310 Dysphagia, unspecified: Secondary | ICD-10-CM | POA: Insufficient documentation

## 2016-05-30 DIAGNOSIS — Z9889 Other specified postprocedural states: Secondary | ICD-10-CM | POA: Insufficient documentation

## 2016-05-30 DIAGNOSIS — Z5189 Encounter for other specified aftercare: Secondary | ICD-10-CM | POA: Insufficient documentation

## 2016-05-30 DIAGNOSIS — R52 Pain, unspecified: Secondary | ICD-10-CM | POA: Insufficient documentation

## 2016-05-30 DIAGNOSIS — I69351 Hemiplegia and hemiparesis following cerebral infarction affecting right dominant side: Secondary | ICD-10-CM

## 2016-05-30 DIAGNOSIS — Z955 Presence of coronary angioplasty implant and graft: Secondary | ICD-10-CM | POA: Insufficient documentation

## 2016-05-30 DIAGNOSIS — I69354 Hemiplegia and hemiparesis following cerebral infarction affecting left non-dominant side: Secondary | ICD-10-CM | POA: Insufficient documentation

## 2016-05-30 DIAGNOSIS — I69391 Dysphagia following cerebral infarction: Secondary | ICD-10-CM | POA: Insufficient documentation

## 2016-05-30 DIAGNOSIS — I69320 Aphasia following cerebral infarction: Secondary | ICD-10-CM | POA: Insufficient documentation

## 2016-05-30 MED FILL — ?ATORVASTATIN 20 MG TABLET: 20 | 30 days supply | Qty: 30 | Fill #1

## 2016-05-30 MED FILL — CLOPIDOGREL 75 MG TABLET: 75 | 30 days supply | Qty: 30 | Fill #1
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
## 2016-06-13 ENCOUNTER — Ambulatory Visit (INDEPENDENT_AMBULATORY_CARE_PROVIDER_SITE_OTHER): Payer: Self-pay | Admitting: *Deleted

## 2016-06-13 DIAGNOSIS — I639 Cerebral infarction, unspecified: Secondary | ICD-10-CM

## 2016-06-14 NOTE — Progress Notes (Signed)
Carelink Summary Report / Loop Recorder 

## 2016-06-20 ENCOUNTER — Encounter: Payer: Self-pay | Admitting: Family Medicine

## 2016-06-20 ENCOUNTER — Ambulatory Visit: Payer: No Typology Code available for payment source | Attending: Family Medicine | Admitting: Family Medicine

## 2016-06-20 VITALS — BP 121/70 | HR 67 | Temp 98.0°F | Resp 18 | Ht 67.0 in | Wt 180.0 lb

## 2016-06-20 DIAGNOSIS — I69392 Facial weakness following cerebral infarction: Secondary | ICD-10-CM | POA: Insufficient documentation

## 2016-06-20 DIAGNOSIS — M7989 Other specified soft tissue disorders: Secondary | ICD-10-CM | POA: Insufficient documentation

## 2016-06-20 DIAGNOSIS — M720 Palmar fascial fibromatosis [Dupuytren]: Secondary | ICD-10-CM | POA: Insufficient documentation

## 2016-06-20 DIAGNOSIS — R490 Dysphonia: Secondary | ICD-10-CM

## 2016-06-20 DIAGNOSIS — L219 Seborrheic dermatitis, unspecified: Secondary | ICD-10-CM

## 2016-06-20 DIAGNOSIS — Z7902 Long term (current) use of antithrombotics/antiplatelets: Secondary | ICD-10-CM | POA: Insufficient documentation

## 2016-06-20 DIAGNOSIS — R0982 Postnasal drip: Secondary | ICD-10-CM

## 2016-06-20 DIAGNOSIS — Z9889 Other specified postprocedural states: Secondary | ICD-10-CM | POA: Insufficient documentation

## 2016-06-20 DIAGNOSIS — I63512 Cerebral infarction due to unspecified occlusion or stenosis of left middle cerebral artery: Secondary | ICD-10-CM

## 2016-06-20 DIAGNOSIS — Z7982 Long term (current) use of aspirin: Secondary | ICD-10-CM | POA: Insufficient documentation

## 2016-06-20 DIAGNOSIS — L299 Pruritus, unspecified: Secondary | ICD-10-CM

## 2016-06-20 DIAGNOSIS — I69351 Hemiplegia and hemiparesis following cerebral infarction affecting right dominant side: Secondary | ICD-10-CM

## 2016-06-20 DIAGNOSIS — Z85828 Personal history of other malignant neoplasm of skin: Secondary | ICD-10-CM | POA: Insufficient documentation

## 2016-06-20 MED ORDER — SELENIUM SULFIDE 2.25 % EX SHAM: 1.0000 "application " | MEDICATED_SHAMPOO | CUTANEOUS | 0 refills | Status: DC

## 2016-06-20 MED ORDER — CETIRIZINE HCL 10 MG PO TABS: 10.0000 mg | ORAL_TABLET | Freq: Every day | ORAL | 1 refills | Status: DC

## 2016-06-20 MED ORDER — HYDROCORTISONE 0.5 % EX CREA: 1.0000 "application " | TOPICAL_CREAM | Freq: Two times a day (BID) | CUTANEOUS | 1 refills | Status: DC

## 2016-06-20 MED ORDER — BACLOFEN 10 MG PO TABS: 10.0000 mg | ORAL_TABLET | Freq: Three times a day (TID) | ORAL | 3 refills | Status: DC

## 2016-06-20 MED ORDER — ATORVASTATIN CALCIUM 20 MG PO TABS: 20.0000 mg | ORAL_TABLET | Freq: Every day | ORAL | 3 refills | Status: DC

## 2016-06-20 MED ORDER — GABAPENTIN 300 MG PO CAPS: 300.0000 mg | ORAL_CAPSULE | Freq: Two times a day (BID) | ORAL | 3 refills | Status: DC

## 2016-06-20 MED ORDER — CLOPIDOGREL BISULFATE 75 MG PO TABS: 75.0000 mg | ORAL_TABLET | Freq: Every day | ORAL | 3 refills | Status: DC

## 2016-06-20 MED FILL — GABAPENTIN 300 MG CAPSULE: 300 | 30 days supply | Qty: 60 | Fill #0

## 2016-06-20 MED FILL — ATORVASTATIN 20 MG TABLET: 20 | 30 days supply | Qty: 30 | Fill #0

## 2016-06-20 MED FILL — BACLOFEN 10 MG TABLET: 10 | 30 days supply | Qty: 90 | Fill #0

## 2016-06-20 MED FILL — ?CETIRIZINE HCL 10 MG TABLE: 10 | 30 days supply | Qty: 30 | Fill #0

## 2016-06-20 MED FILL — CLOPIDOGREL 75 MG TABLET: 75 | 30 days supply | Qty: 30 | Fill #0

## 2016-06-20 NOTE — Progress Notes (Signed)
Subjective:  Patient ID: James Cortez, male    DOB: 05/26/1965  Age: 51 y.o. MRN: [id number]  CC: James Cortez is a 51 year old male with left middle cerebral artery stroke in 12/2015 (with initial presentation of aphasia, right facial droop and right hemiparesis who received TPA with improvement in stroke score) s/p loop recorder placement here for follow-up visit.  At his last visit he was treated for pruritus of the scalp with selenium sulfide shampoo but reports his scalp still itches. He had also complained of pruritus of his face and today points to his nasolabial folds and posterior to both ears as source of itch. He was prescribed hydrocortisone at his last visit. Denies presence of pruritus in other body parts and has no rash.  Concerned about voice hoarseness which he has had ever since his stroke; he also complains of postnasal drip but no sinus tenderness or congestion. He was never able to undergo speech and physical therapy due to lack of medical coverage. He does have some mild residual aphasia.  He has had right hand pain which is rated at an 8/10 and is worse with movement. Pain is described as throbbing and he denies dropping things. He has noticed swelling of his right hand and decreased ability to flex his index finger ever since he try to exercise at home; he does have an underlying Dupuytren's contracture in his right hand and has had surgery his left hand in the past  Past Medical History:  Diagnosis Date  . Skin cancer     Past Surgical History:  Procedure Laterality Date  . EP IMPLANTABLE DEVICE N/A 12/15/2015   Procedure: Loop Recorder Insertion;  Surgeon: Will Meredith Leeds, MD;  Location: [facility];  Service: Cardiovascular;  Laterality: N/A;  . IR GENERIC HISTORICAL  12/08/2015   IR PERCUTANEOUS ART THROMBECTOMY/INFUSION INTRACRANIAL INC DIAG ANGIO 12/08/2015 Luanne Bras, MD [facility]  .  RADIOLOGY WITH ANESTHESIA Right 12/08/2015   Procedure: RADIOLOGY WITH ANESTHESIA - CODE STROKE;  Surgeon: Medication Radiologist, MD;  Location: [facility];  Service: Radiology;  Laterality: Right;  . SKIN SURGERY    . TEE WITHOUT CARDIOVERSION N/A 12/15/2015   Procedure: TRANSESOPHAGEAL ECHOCARDIOGRAM (TEE);  Surgeon: Sueanne Margarita, MD;  Location: [facility];  Service: Cardiovascular;  Laterality: N/A;    No Known Allergies   Outpatient Medications Prior to Visit  Medication Sig Dispense Refill  . aspirin 325 MG EC tablet Take 1 tablet (325 mg total) by mouth daily. 30 tablet 2  . butalbital-acetaminophen-caffeine (FIORICET, ESGIC) 50-325-40 MG tablet Take 1-2 tablets by mouth every 12 (twelve) hours as needed for headache. 30 tablet 0  . diclofenac sodium (VOLTAREN) 1 % GEL Apply 2 g topically 4 (four) times daily. 3 Tube 2  . OVER THE COUNTER MEDICATION Place 1-2 drops into both eyes daily as needed (eye irritation).    Marland Kitchen atorvastatin (LIPITOR) 20 MG tablet Take 1 tablet (20 mg total) by mouth daily at 6 PM. 30 tablet 3  . baclofen (LIORESAL) 10 MG tablet Take 1 tablet (10 mg total) by mouth 3 (three) times daily. 90 tablet 3  . clopidogrel (PLAVIX) 75 MG tablet Take 1 tablet (75 mg total) by mouth daily with breakfast. 30 tablet 3  . gabapentin (NEURONTIN) 300 MG capsule Take 1 capsule (300 mg total) by mouth 2 (two) times daily. 60 capsule 3  . hydrocortisone cream 0.5 % Apply 1 application topically 2 (two)  times daily. To the face 30 g 1  . Selenium Sulfide 2.25 % SHAM Apply 1 application topically 2 (two) times a week. 180 mL 0   No facility-administered medications prior to visit.     ROS Review of Systems  Constitutional: Negative for activity change and appetite change.  HENT: Positive for postnasal drip and voice change. Negative for sinus pressure and sore throat.   Eyes: Negative for visual disturbance.  Respiratory: Negative for cough, chest tightness and shortness of  breath.   Cardiovascular: Negative for chest pain and leg swelling.  Gastrointestinal: Negative for abdominal distention, abdominal pain, constipation and diarrhea.  Endocrine: Negative.   Genitourinary: Negative for dysuria.  Musculoskeletal:       See hpi  Skin: Negative for rash.       Positive for pruritus  Allergic/Immunologic: Negative.   Neurological: Negative for weakness, light-headedness and numbness.  Psychiatric/Behavioral: Negative for dysphoric mood and suicidal ideas.    Objective:  BP 121/70 (BP Location: Left Arm, Patient Position: Sitting, Cuff Size: Normal)   Pulse 67   Temp 98 F (36.7 C) (Oral)   Resp 18   Ht 5\' 7"  (1.702 m)   Wt 180 lb (81.6 kg)   SpO2 94%   BMI 28.19 kg/m   BP/Weight 06/20/2016 05/30/2016 6/38/4536  Systolic BP 468 032 122  Diastolic BP 70 75 82  Wt. (Lbs) 180 - 176.4  BMI 28.19 - 27.22      Physical Exam Constitutional: He is oriented to person, place, and time. He appears well-developed and well-nourished.  HENT: Mild oropharyngeal erythema  Right Ear: External ear normal.  Left Ear: External ear normal. Cerumen partially obscuring left TM  Cardiovascular: Normal rate, normal heart sounds and intact distal pulses.   No murmur heard. Pulmonary/Chest: Effort normal and breath sounds normal. He has no wheezes. He has no rales. He exhibits no tenderness.  Loop recorder in left upper chest wall  Abdominal: Soft. Bowel sounds are normal. He exhibits no distension and no mass. There is no tenderness.  Musculoskeletal: He exhibits edema (slight edema of the right hand and MCP joints).  Duputyren's contractures in right hand unable to make a fist in right hand.  Neurological: He is alert and oriented to person, place, and time.  Skin:  No rash evident face   Assessment & Plan:   1. Left middle cerebral artery stroke (HCC) Risk factor modification - atorvastatin (LIPITOR) 20 MG tablet; Take 1 tablet (20 mg total) by mouth daily at  6 PM.  Dispense: 30 tablet; Refill: 3 - clopidogrel (PLAVIX) 75 MG tablet; Take 1 tablet (75 mg total) by mouth daily with breakfast.  Dispense: 30 tablet; Refill: 3  2. Hemiparesis affecting right side as late effect of cerebrovascular accident (CVA) ([place]) This could explain chronic right arm pain Home physical therapy as tolerated Lack of medical coverage has been a major hindrance to physical therapy I have placed a referral today for physical therapy - baclofen (LIORESAL) 10 MG tablet; Take 1 tablet (10 mg total) by mouth 3 (three) times daily.  Dispense: 90 tablet; Refill: 3 - gabapentin (NEURONTIN) 300 MG capsule; Take 1 capsule (300 mg total) by mouth 2 (two) times daily.  Dispense: 60 capsule; Refill: 3  3. Pruritus - hydrocortisone cream 0.5 %; Apply 1 application topically 2 (two) times daily. To the face  Dispense: 30 g; Refill: 1  4. Seborrheic dermatitis Patient advised to apply grease to scalp after washing - Selenium Sulfide 2.25 %  SHAM; Apply 1 application topically 2 (two) times a week.  Dispense: 180 mL; Refill: 0  5. Post-nasal drip - cetirizine (ZYRTEC) 10 MG tablet; Take 1 tablet (10 mg total) by mouth daily.  Dispense: 30 tablet; Refill: 1  6. Voice hoarseness We'll refer for speech therapy    Meds ordered this encounter  Medications  . cetirizine (ZYRTEC) 10 MG tablet    Sig: Take 1 tablet (10 mg total) by mouth daily.    Dispense:  30 tablet    Refill:  1  . atorvastatin (LIPITOR) 20 MG tablet    Sig: Take 1 tablet (20 mg total) by mouth daily at 6 PM.    Dispense:  30 tablet    Refill:  3  . clopidogrel (PLAVIX) 75 MG tablet    Sig: Take 1 tablet (75 mg total) by mouth daily with breakfast.    Dispense:  30 tablet    Refill:  3  . baclofen (LIORESAL) 10 MG tablet    Sig: Take 1 tablet (10 mg total) by mouth 3 (three) times daily.    Dispense:  90 tablet    Refill:  3  . gabapentin (NEURONTIN) 300 MG capsule    Sig: Take 1 capsule (300 mg total) by  mouth 2 (two) times daily.    Dispense:  60 capsule    Refill:  3  . Selenium Sulfide 2.25 % SHAM    Sig: Apply 1 application topically 2 (two) times a week.    Dispense:  180 mL    Refill:  0  . hydrocortisone cream 0.5 %    Sig: Apply 1 application topically 2 (two) times daily. To the face    Dispense:  30 g    Refill:  1    Follow-up: Return in about 3 months (around 09/20/2016) for follow up of chronic medical conditions.   Arnoldo Morale MD

## 2016-06-20 NOTE — Progress Notes (Signed)
Patient is here for FU  Patient denies pain at this time.  Patient states after his stroke he was unable to speak. Patient states after 2 months of not being able to speak his voice returned. Patient states his voice has been hoarse ever since.  Patient has not taken medication today. Patient has eaten today.

## 2016-06-20 NOTE — Patient Instructions (Signed)
Dermatitis seborreica (Seborrheic Dermatitis) La dermatitis seborreica se caracteriza por la aparicin de zonas de piel rosadas o enrojecidas y cubiertas de Facilities manager. Generalmente se produce en el cuero cabelludo y a menudo se la conoce como caspa. Tambin [employer], la [place], las [place], [place] pecho y la zona [place] de barba en el rostro de los hombres. Suele manifestarse en las zonas de la piel donde hay ms glndulas sebceas (grasosas). Puede aparecer y desaparecer sin un motivo conocido, y frecuentemente es una afeccin de larga duracin (crnica). CAUSAS La causa no se conoce. FACTORES DE RIESGO Es ms probable que esta afeccin se manifieste en:  [employer] estn estresadas o cansadas.  Las [employer] tienen enfermedades de la piel, como acn.  Las personas que tienen determinadas enfermedades, por ejemplo:  VIH (virus de inmunodeficiencia humana).  sida (sndrome de inmunodeficiencia adquirida).  Enfermedad de Parkinson.  Un trastorno de Youth worker.  Ictus.  Depresin.  Epilepsia.  Alcoholismo.  Las personas que viven en lugares donde las condiciones climticas son extremas.  Las personas que tienen antecedentes familiares de dermatitis seborreica.  Las personas que usan cremas para la piel elaboradas a base de alcohol.  Las [employer] tienen entre 53 y 42AJG.  Las personas que toman determinados medicamentos. SNTOMAS Los sntomas de esta afeccin incluyen lo siguiente:  Escamas gruesas en el cuero cabelludo.  Enrojecimiento en el rostro o en las Jeannette.  Piel escamosa. Las [employer] ser de color blanco o [place].  Piel que parece ser grasa o seca pero que no mejora con cremas hidratantes.  Picazn o [employer] zonas afectadas. DIAGNSTICO Esta afeccin se diagnostica mediante la historia clnica y un examen fsico. Se pueden hacer estudios de una muestra de piel (biopsia de piel). Tal vez haya que consultar a  un especialista en piel (dermatlogo). TRATAMIENTO No hay cura para esta afeccin, pero el tratamiento puede ayudar a [employer] sntomas. El tratamiento puede incluir lo siguiente:  Ungentos, cremas o lociones con cortisona (corticoides).  Champs recetados o de [employer]. INSTRUCCIONES PARA EL CUIDADO EN EL HOGAR  Aplquese los medicamentos de venta libre y los recetados solamente como se lo haya indicado el mdico.  Consulting civil engineer a todas las visitas de control como se lo haya indicado el mdico. Esto es importante.  Trate de reducir [employer] de estrs con actividades tales como el yoga o la meditacin. Si necesita ayuda para reducir [employer] de estrs, consulte al mdico.  Dchese o bese como se lo haya indicado el mdico.  Use los champs medicinales como se lo haya indicado el mdico. SOLICITE ATENCIN MDICA SI:  Los sntomas no mejoran con Dispensing optician.  Los sntomas empeoran.  Aparecen nuevos sntomas. Esta informacin no tiene Marine scientist el consejo del mdico. Asegrese de hacerle al mdico cualquier pregunta que tenga. Document Released: 01/11/2012 Document Revised: 10/15/2014 Elsevier Interactive Patient Education  2017 [employer].

## 2016-06-21 ENCOUNTER — Telehealth: Payer: Self-pay | Admitting: Family Medicine

## 2016-06-21 ENCOUNTER — Ambulatory Visit: Payer: No Typology Code available for payment source | Attending: Family Medicine | Admitting: Pharmacist

## 2016-06-21 DIAGNOSIS — Z7689 Persons encountering health services in other specified circumstances: Secondary | ICD-10-CM | POA: Insufficient documentation

## 2016-06-21 DIAGNOSIS — Z79899 Other long term (current) drug therapy: Secondary | ICD-10-CM

## 2016-06-21 NOTE — Telephone Encounter (Signed)
Pt called to state that he is still experiencing pain in his hand and feels that Dr Jarold Song is not addressing the concern, feels that he is always being told the same thing and it is not helping.

## 2016-06-21 NOTE — Telephone Encounter (Signed)
I did explain to him that he has right sided weakness and right hand pain which are residual deficits from his stroke. I have referred him to physical therapy hoping he can see them even though he has no medical coverage.

## 2016-06-21 NOTE — Progress Notes (Signed)
Patient presented for review of indications of all of his medications. All medication indications were reviewed and patient verbalized understanding.

## 2016-06-22 ENCOUNTER — Ambulatory Visit: Payer: MEDICAID | Admitting: Neurology

## 2016-06-24 ENCOUNTER — Ambulatory Visit: Payer: No Typology Code available for payment source | Admitting: Physical Therapy

## 2016-06-24 ENCOUNTER — Ambulatory Visit: Payer: No Typology Code available for payment source | Attending: Family Medicine

## 2016-06-24 DIAGNOSIS — G8191 Hemiplegia, unspecified affecting right dominant side: Secondary | ICD-10-CM

## 2016-06-24 DIAGNOSIS — R4701 Aphasia: Secondary | ICD-10-CM | POA: Insufficient documentation

## 2016-06-24 LAB — CUP PACEART REMOTE DEVICE CHECK
Implantable Pulse Generator Implant Date: 20171107
MDC IDC SESS DTM: 20180506201020

## 2016-06-24 NOTE — Therapy (Signed)
[facility] [address] [place], [place], [address] Phone: [phone]   Fax:  [phone]  Speech Language Pathology Evaluation  Patient Details  Name: James Cortez MRN: [id number] Date of Birth: 1965/03/26 Referring Provider: Arnoldo Morale  Encounter Date: 06/24/2016      End of Session - 06/24/16 1721    Visit Number 1   Number of Visits 17   Date for SLP Re-Evaluation 09/02/16   SLP Start Time 1450   SLP Stop Time  1530   SLP Time Calculation (min) 40 min   Activity Tolerance Patient tolerated treatment well      Past Medical History:  Diagnosis Date  . Skin cancer     Past Surgical History:  Procedure Laterality Date  . EP IMPLANTABLE DEVICE N/A 12/15/2015   Procedure: Loop Recorder Insertion;  Surgeon: Will Meredith Leeds, MD;  Location: [facility];  Service: Cardiovascular;  Laterality: N/A;  . IR GENERIC HISTORICAL  12/08/2015   IR PERCUTANEOUS ART THROMBECTOMY/INFUSION INTRACRANIAL INC DIAG ANGIO 12/08/2015 Luanne Bras, MD [facility]  . RADIOLOGY WITH ANESTHESIA Right 12/08/2015   Procedure: RADIOLOGY WITH ANESTHESIA - CODE STROKE;  Surgeon: Medication Radiologist, MD;  Location: [facility];  Service: Radiology;  Laterality: Right;  . SKIN SURGERY    . TEE WITHOUT CARDIOVERSION N/A 12/15/2015   Procedure: TRANSESOPHAGEAL ECHOCARDIOGRAM (TEE);  Surgeon: Sueanne Margarita, MD;  Location: [facility] ENDOSCOPY;  Service: Cardiovascular;  Laterality: N/A;    There were no vitals filed for this visit.      Subjective Assessment - 06/24/16 1452    Subjective Pt complains of difficulty with hoarseness for approx 4 months. Denies coughing/difficulty with meals.   Patient is accompained by: Interpreter   Currently in Pain? No/denies            SLP Evaluation [facility] - 06/24/16 1452      SLP Visit Information   SLP Received On 06/24/16   Referring Provider Arnoldo Morale   Onset Date  January 2017   Medical Diagnosis lt CVA     General Information   HPI Pt with CVA in November 2017 and seen on CIR for mostly cognitive-linguistics and anomic goals. Pt complains of hoarseness of approx 4 month history and cont'd word finding/sentence construction difficulty.  Denies swallowing difficulty.     Prior Functional Status   Cognitive/Linguistic Baseline Within functional limits   Type of Home House    Lives With Family     Cognition   Overall Cognitive Status Within Functional Limits for tasks assessed     Auditory Comprehension   Overall Auditory Comprehension Appears within functional limits for tasks assessed     Verbal Expression   Overall Verbal Expression Impaired   Initiation No impairment   Level of Generative/Spontaneous Verbalization Conversation   Other Verbal Expression Comments Wife states premorbidly pt could speak relatively fluent English and now skills are greatly reduced, and that pt has regular dysnomias with difficulty constructing sentences at times. With skilled observation of pt's 60-second explanation of simple subject (family and places of residence and job responsibilities), pt with many conversational fillers and hesitation/pausant speech. Interpreter stated pt also perseverated with nondescript words. Without formal Spanish assessment, SLP cannot adequately assess language in his native language and must rely on questions to the interpreter and skilled observation of pt when verbalizing.      Oral Motor/Sensory Function   Overall Oral Motor/Sensory Function Appears within functional limits for tasks assessed  Motor Speech   Overall Motor Speech Appears within functional limits for tasks assessed                         SLP Education - 06/24/16 1720    Education provided Yes   Education Details Aphasia, home tasks, apps (crossword puzzles in [place])   Person(s) Educated Patient;Spouse   Methods Explanation   Comprehension  Verbalized understanding;Need further instruction  via interpreter          SLP Short Term Goals - 06/24/16 1727      SLP [place] #1   Title pt will complete mod complex descriptions with modifed independence (compensations) over three sessions   Time 4   Period Weeks     SLP SHORT TERM GOAL #2   Title pt will functionally participate in simple conversaiton for 10 minutes without communication breakdown, over two sessions   Time 4   Period Weeks   Status New          SLP Long Term Goals - 06/24/16 1730      SLP LONG TERM GOAL #1   Title Pt will produce 5 minutes of mod complex conversation with modified indpendence (compensations) over two sessions   Time 8   Period Weeks   Status New          Plan - 06/24/16 1721    Clinical Impression Statement As best SLP can ID, pt presetns today with anomia/dysnomia affecting his ability to speak Spanish fluently. Wife reports pt can no longer speak English functionally as premorbidly. SLP observed verbal expression deficits in pt's conversational speech characterized by conversational fillers and interpreter telling SLP pt repeated vague/general words for descriptions. Pt would benefit from skilled ST to improve his verbal communication at home and in the community. SLP to contact pt's MD to suggest referral to ENT.   Speech Therapy Frequency 2x / week   Duration --  8 weeks or 17 visits   Treatment/Interventions Language facilitation;Compensatory techniques;Internal/external aids;SLP instruction and feedback;Multimodal communcation approach;Functional tasks;Patient/family education;Cueing hierarchy   Potential to Achieve Goals Good      Patient will benefit from skilled therapeutic intervention in order to improve the following deficits and impairments:   Aphasia    Problem List Patient Active Problem List   Diagnosis Date Noted  . Dupuytren's contracture of right hand 02/18/2016  . Gait disturbance, post-stroke  01/04/2016  . Hemiparesis affecting right side as late effect of cerebrovascular accident (CVA) ([place]) 01/04/2016  . Cerebral infarction due to thrombosis of left middle cerebral artery ([place]) 12/15/2015  . Left middle cerebral artery stroke ([place]) 12/15/2015  . Oropharyngeal dysphagia   . Aphasia as late effect of stroke   . Right hemiplegia ([place])   . Acute on chronic respiratory failure ([place])   . Essential hypertension   . CVA (cerebral vascular accident) ([place]) 12/08/2015    [facility] ,[place], [place]  06/24/2016, 5:31 PM  [facility] [address] [place], [place], [address] Phone: [phone]   Fax:  [phone]  Name: Nirvaan Frett MRN: [id number] Date of Birth: [birth year]

## 2016-06-24 NOTE — Patient Instructions (Signed)
Think of at least 10 words for each of these categories:  [place]

## 2016-06-24 NOTE — Therapy (Signed)
[facility] [address] [place], [place], [address] Phone: [phone]   Fax:  [phone]  Physical Therapy Evaluation and Discharge  Patient Details  Name: James Cortez MRN: [id number] Date of Birth: 10-18-1965 Referring Provider: Arnoldo Morale  Encounter Date: 06/24/2016      PT End of Session - 06/24/16 1558    Visit Number 1   Number of Visits 1   PT Start Time 1450   PT Stop Time 1531   PT Time Calculation (min) 41 min   Activity Tolerance Patient tolerated treatment well   Behavior During Therapy [facility] for tasks assessed/performed      Past Medical History:  Diagnosis Date  . Skin cancer     Past Surgical History:  Procedure Laterality Date  . EP IMPLANTABLE DEVICE N/A 12/15/2015   Procedure: Loop Recorder Insertion;  Surgeon: Will Meredith Leeds, MD;  Location: [place] CV LAB;  Service: Cardiovascular;  Laterality: N/A;  . IR GENERIC HISTORICAL  12/08/2015   IR PERCUTANEOUS ART THROMBECTOMY/INFUSION INTRACRANIAL INC DIAG ANGIO 12/08/2015 Luanne Bras, MD [facility]-INTERV RAD  . RADIOLOGY WITH ANESTHESIA Right 12/08/2015   Procedure: RADIOLOGY WITH ANESTHESIA - CODE STROKE;  Surgeon: Medication Radiologist, MD;  Location: [place];  Service: Radiology;  Laterality: Right;  . SKIN SURGERY    . TEE WITHOUT CARDIOVERSION N/A 12/15/2015   Procedure: TRANSESOPHAGEAL ECHOCARDIOGRAM (TEE);  Surgeon: Sueanne Margarita, MD;  Location: [facility] ENDOSCOPY;  Service: Cardiovascular;  Laterality: N/A;    There were no vitals filed for this visit.       Subjective Assessment - 06/24/16 1404    Subjective Patient reports he sometimes drags his RLE, but denies LOB or falls. Reports when he walks a lot he gets a pain in his rt anterior hip (wife notes it is the site used for "camera that then went up to his brain"). Reports numbness RLE compared to LLE, however RUE pain and numbness are worse.    Patient is  accompained by: Family member;Interpreter  "mother of my children, we are not married" [employer] interpreter present   Patient Stated Goals I want to get well; be able to use my hand   Currently in Pain? Yes   Pain Location Shoulder   Pain Orientation Right;Anterior   Pain Descriptors / Indicators Squeezing;Numbness   Pain Type Acute pain   Pain Radiating Towards between rt side of neck down to rt elbow   Pain Onset More than a month ago   Pain Frequency Constant   Aggravating Factors  sleeping on his rt side   Pain Relieving Factors sometimes moving it             [facility] PT Assessment - 06/24/16 0001      Assessment   Medical Diagnosis Lt MCA   Referring Provider Arnoldo Morale   Onset Date/Surgical Date --  12/2015   Hand Dominance Right   Prior Therapy HHPT     Precautions   Precautions None     Balance Screen   Has the patient fallen in the past 6 months No   Has the patient had a decrease in activity level because of a fear of falling?  No   Is the patient reluctant to leave their home because of a fear of falling?  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     Cognition   Overall Cognitive Status Within Functional Limits for tasks assessed     Observation/Other Assessments   Focus on Therapeutic Outcomes (FOTO)  not completed;      Sensation   Light Touch Impaired by gross assessment  see subjective     ROM / Strength   AROM / PROM / Strength AROM;Strength     AROM   Overall AROM  Within functional limits for tasks performed   AROM Assessment Site Hip;Knee;Ankle     Strength   Overall Strength Deficits   Overall Strength Comments LLE WNL; RLE compared to LLE hip flexion & abduction WNL; knee  extension 4+, ankle DF 4+     Transfers   Transfers Sit to Stand;Stand to Sit   Sit to Stand 7: Independent   Stand to Sit 7: Independent     Ambulation/Gait   Ambulation/Gait Yes   Ambulation/Gait Assistance 7: Independent   Ambulation Distance (Feet) 120 Feet   Assistive device None   Gait Pattern Within Functional Limits   Ambulation Surface Level   Gait velocity 20 ft/5.13 sec= 3.90 ft/sec (normal comfortable pace)     Balance   Balance Assessed Yes     High Level Balance   High Level Balance Activities Direction changes;Turns;Sudden stops;Head turns;Negotitating around obstacles   High Level Balance Comments no difficulty with above tasks; in addition, SLS on RLE >30 sec (PT ended test @ 30); tandem stance RLE behind x 30 seconds; rhomberg EC x 30 sec)                           PT Education - 06/24/16 1556    Education provided Yes   Education Details Difference between PT and OT; 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 Patient with parasthesias entire RLE and mild weakness in RLE compared to left, however demonstrated balance and gait WFL. 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 He is more concerned re: his RUE return and his speaking/voice. Patient and partner agreed with plan to discontinue from PT and will seek an OT order from the physician (already has a SLP order).    Consulted and Agree with Plan of Care Patient      Patient will benefit from skilled therapeutic intervention in order to improve the following deficits and impairments:     Visit Diagnosis: Right hemiplegia [facility])     Problem List Patient Active Problem List    Diagnosis Date Noted  . Dupuytren's contracture of right hand 02/18/2016  . Gait disturbance, post-stroke 01/04/2016  . Hemiparesis affecting right side as late effect of cerebrovascular accident (CVA) ([place]) 01/04/2016  . Cerebral infarction due to thrombosis of left middle cerebral artery ([place]) 12/15/2015  . Left middle cerebral artery stroke ([place]) 12/15/2015  . Oropharyngeal dysphagia   . Aphasia as late effect of stroke   . Right hemiplegia ([place])   . Acute on chronic respiratory failure ([place])   .  Essential hypertension   . CVA (cerebral vascular accident) ([place]) 12/08/2015    Rexanne Mano, PT 06/24/2016, 4:04 PM  [place] [facility] [address] [place], [place], [address] Phone: [phone]   Fax:  [phone]  Name: James Cortez MRN: [id number] Date of Birth: 1965/10/10

## 2016-06-29 ENCOUNTER — Ambulatory Visit: Payer: No Typology Code available for payment source

## 2016-06-29 DIAGNOSIS — R4701 Aphasia: Secondary | ICD-10-CM

## 2016-06-29 NOTE — Therapy (Signed)
[facility] [address] [place], [place], [address] Phone: [phone]   Fax:  [phone]  Speech Language Pathology Treatment  Patient Details  Name: James Cortez MRN: [id number] Date of Birth: 06/11/1965 Referring Provider: Arnoldo Morale  Encounter Date: 06/29/2016      End of Session - 06/29/16 1048    Visit Number 2   Number of Visits 17   Date for SLP Re-Evaluation 09/02/16   SLP Start Time 0933   SLP Stop Time  6834   SLP Time Calculation (min) 45 min   Activity Tolerance Patient tolerated treatment well      Past Medical History:  Diagnosis Date  . Skin cancer     Past Surgical History:  Procedure Laterality Date  . EP IMPLANTABLE DEVICE N/A 12/15/2015   Procedure: Loop Recorder Insertion;  Surgeon: Will Meredith Leeds, MD;  Location: [place] CV LAB;  Service: Cardiovascular;  Laterality: N/A;  . IR GENERIC HISTORICAL  12/08/2015   IR PERCUTANEOUS ART THROMBECTOMY/INFUSION INTRACRANIAL INC DIAG ANGIO 12/08/2015 Luanne Bras, MD [facility]-INTERV RAD  . RADIOLOGY WITH ANESTHESIA Right 12/08/2015   Procedure: RADIOLOGY WITH ANESTHESIA - CODE STROKE;  Surgeon: Medication Radiologist, MD;  Location: [place];  Service: Radiology;  Laterality: Right;  . SKIN SURGERY    . TEE WITHOUT CARDIOVERSION N/A 12/15/2015   Procedure: TRANSESOPHAGEAL ECHOCARDIOGRAM (TEE);  Surgeon: Sueanne Margarita, MD;  Location: [facility] ENDOSCOPY;  Service: Cardiovascular;  Laterality: N/A;    There were no vitals filed for this visit.      Subjective Assessment - 06/29/16 0937    Subjective Pt arrives with interpreter.   Currently in Pain? No/denies               ADULT SLP TREATMENT - 06/29/16 0942      General Information   Behavior/Cognition Alert;Cooperative;Pleasant mood     Treatment Provided   Treatment provided Cognitive-Linquistic     Cognitive-Linquistic Treatment   Treatment focused on Aphasia    Skilled Treatment SLP eduated pt about compensatory strategies (synonym, description, rephrase, gesture, drawing). Pt appeared reluctant to draw or gesture at this time. Practice with description of food/drink necessitated written cues for how to describe (color, location, size, etc.). Pt req'd usual questioning cues from SLP to expound on descriptions for SLP to know what the picture was.      Assessment / Recommendations / Plan   Plan Continue with current plan of care     Progression Toward Goals   Progression toward goals Progressing toward goals          SLP Education - 06/29/16 1047    Education provided Yes   Education Details compensatory strategies   Person(s) Educated Patient;Spouse   Methods Explanation;Demonstration;Verbal cues;Handout   Comprehension Verbalized understanding;Returned demonstration;Verbal cues required;Need further instruction          SLP Short Term Goals - 06/29/16 1049      SLP SHORT TERM GOAL #1   Title pt will complete mod complex descriptions with modifed independence (compensations) over three sessions   Time 4   Period Weeks   Status On-going     SLP SHORT TERM GOAL #2   Title pt will functionally participate in simple conversaiton for 10 minutes without communication breakdown, over two sessions   Time 4   Period Weeks   Status On-going          SLP Long Term Goals - 06/29/16 1049  SLP LONG TERM GOAL #1   Title Pt will produce 5 minutes of mod complex conversation with modified indpendence (compensations) over two sessions   Time 8   Period Weeks   Status On-going          Plan - 06/29/16 1048    Clinical Impression Statement Pt cont'd to present today with expressive verbal aphasia affecting his ability to speak Spanish fluently. He was educated and practiced compensatory strategies today. Pt would benefit from skilled ST to improve his verbal communication at home and in the community. SLP to contact pt's MD to  suggest referral to ENT.   Speech Therapy Frequency 2x / week   Duration --  8 weeks or 17 visits   Treatment/Interventions Language facilitation;Compensatory techniques;Internal/external aids;SLP instruction and feedback;Multimodal communcation approach;Functional tasks;Patient/family education;Cueing hierarchy   Potential to Achieve Goals Good      Patient will benefit from skilled therapeutic intervention in order to improve the following deficits and impairments:   Aphasia    Problem List Patient Active Problem List   Diagnosis Date Noted  . Dupuytren's contracture of right hand 02/18/2016  . Gait disturbance, post-stroke 01/04/2016  . Hemiparesis affecting right side as late effect of cerebrovascular accident (CVA) ([place]) 01/04/2016  . Cerebral infarction due to thrombosis of left middle cerebral artery ([place]) 12/15/2015  . Left middle cerebral artery stroke ([place]) 12/15/2015  . Oropharyngeal dysphagia   . Aphasia as late effect of stroke   . Right hemiplegia ([employer])   . Acute on chronic respiratory failure ([place])   . Essential hypertension   . CVA (cerebral vascular accident) ([place]) 12/08/2015    [facility] ,[place], [place]  06/29/2016, 10:50 AM  [facility] [address] [place], [place], [address] Phone: [phone]   Fax:  [phone]   Name: James Cortez MRN: [id number] Date of Birth: May 27, 1965

## 2016-06-29 NOTE — Patient Instructions (Signed)
  Please complete the assigned speech therapy homework prior to your next session and return it to the speech therapist at your next visit.  

## 2016-07-01 ENCOUNTER — Ambulatory Visit: Payer: No Typology Code available for payment source

## 2016-07-01 DIAGNOSIS — R4701 Aphasia: Secondary | ICD-10-CM

## 2016-07-01 NOTE — Patient Instructions (Signed)
  Please complete the assigned speech therapy homework prior to your next session and return it to the speech therapist at your next visit.  

## 2016-07-01 NOTE — Therapy (Signed)
[facility] [address] [place], [place], [address] Phone: [phone]   Fax:  [phone]  Speech Language Pathology Treatment  Patient Details  Name: James Cortez MRN: [id number] Date of Birth: 04-02-1965 Referring Provider: Arnoldo Morale  Encounter Date: 07/01/2016      End of Session - 07/01/16 1350    Visit Number 3   Number of Visits 17   Date for SLP Re-Evaluation 09/02/16   SLP Start Time 1107   SLP Stop Time  1146   SLP Time Calculation (min) 39 min   Activity Tolerance Patient tolerated treatment well      Past Medical History:  Diagnosis Date  . Skin cancer     Past Surgical History:  Procedure Laterality Date  . EP IMPLANTABLE DEVICE N/A 12/15/2015   Procedure: Loop Recorder Insertion;  Surgeon: Will Meredith Leeds, MD;  Location: [place] CV LAB;  Service: Cardiovascular;  Laterality: N/A;  . IR GENERIC HISTORICAL  12/08/2015   IR PERCUTANEOUS ART THROMBECTOMY/INFUSION INTRACRANIAL INC DIAG ANGIO 12/08/2015 Luanne Bras, MD [facility]-INTERV RAD  . RADIOLOGY WITH ANESTHESIA Right 12/08/2015   Procedure: RADIOLOGY WITH ANESTHESIA - CODE STROKE;  Surgeon: Medication Radiologist, MD;  Location: [place];  Service: Radiology;  Laterality: Right;  . SKIN SURGERY    . TEE WITHOUT CARDIOVERSION N/A 12/15/2015   Procedure: TRANSESOPHAGEAL ECHOCARDIOGRAM (TEE);  Surgeon: Sueanne Margarita, MD;  Location: [facility] ENDOSCOPY;  Service: Cardiovascular;  Laterality: N/A;    There were no vitals filed for this visit.      Subjective Assessment - 07/01/16 1155    Subjective Pt arrives with interpreter.   Patient is accompained by: Interpreter  7 minutes late   Currently in Pain? No/denies               ADULT SLP TREATMENT - 07/01/16 1156      General Information   Behavior/Cognition Alert;Cooperative;Pleasant mood     Treatment Provided   Treatment provided Cognitive-Linquistic     Cognitive-Linquistic Treatment   Treatment focused on Aphasia   Skilled Treatment SLP re-educated pt on compensations for verbal expression. Pt mentioned a weakness in his rt hand, and problem with his gait when he walks quickly. In similarity/difference task today pt req'd extra time.      Assessment / Recommendations / Plan   Plan Continue with current plan of care     Progression Toward Goals   Progression toward goals Progressing toward goals          SLP Education - 07/01/16 1350    Education provided Yes   Education Details compensations   Person(s) Educated Patient   Methods Explanation;Demonstration;Verbal cues   Comprehension Verbalized understanding;Need further instruction;Returned demonstration          SLP Short Term Goals - 07/01/16 1352      SLP SHORT TERM GOAL #1   Title pt will complete mod complex descriptions with modifed independence (compensations) over three sessions   Time 4   Period Weeks   Status On-going     SLP SHORT TERM GOAL #2   Title pt will functionally participate in simple conversaiton for 10 minutes without communication breakdown, over two sessions   Time 4   Period Weeks   Status On-going          SLP Long Term Goals - 07/01/16 1352      SLP LONG TERM GOAL #1   Title Pt will produce 5 minutes of mod complex  conversation with modified indpendence (compensations) over two sessions   Time 8   Period Weeks   Status On-going          Plan - 07/01/16 1351    Clinical Impression Statement Pt cont'd to present today with expressive verbal aphasia affecting his ability to speak Spanish fluently. He was educated and practiced compensatory strategies today. Pt would benefit from skilled ST to improve his verbal communication at home and in the community. SLP to contact pt's MD to suggest referral to ENT.   Speech Therapy Frequency 2x / week   Duration --  8 weeks or 17 visits   Treatment/Interventions Language  facilitation;Compensatory techniques;Internal/external aids;SLP instruction and feedback;Multimodal communcation approach;Functional tasks;Patient/family education;Cueing hierarchy   Potential to Achieve Goals Good   Consulted and Agree with Plan of Care Patient      Patient will benefit from skilled therapeutic intervention in order to improve the following deficits and impairments:   Aphasia    Problem List Patient Active Problem List   Diagnosis Date Noted  . Dupuytren's contracture of right hand 02/18/2016  . Gait disturbance, post-stroke 01/04/2016  . Hemiparesis affecting right side as late effect of cerebrovascular accident (CVA) ([place]) 01/04/2016  . Cerebral infarction due to thrombosis of left middle cerebral artery ([place]) 12/15/2015  . Left middle cerebral artery stroke ([place]) 12/15/2015  . Oropharyngeal dysphagia   . Aphasia as late effect of stroke   . Right hemiplegia ([place])   . Acute on chronic respiratory failure ([place])   . Essential hypertension   . CVA (cerebral vascular accident) ([place]) 12/08/2015    [facility] ,[place], [place]  07/01/2016, 1:52 PM  [place] [address] [place], [place], [address] Phone: [phone]   Fax:  [phone]   Name: James Cortez MRN: [id number] Date of Birth: 03/01/1965

## 2016-07-05 ENCOUNTER — Ambulatory Visit: Payer: No Typology Code available for payment source

## 2016-07-05 DIAGNOSIS — R4701 Aphasia: Secondary | ICD-10-CM

## 2016-07-05 NOTE — Therapy (Signed)
[facility] [address] [place], [place], [address] Phone: [phone]   Fax:  [phone]  Speech Language Pathology Treatment  Patient Details  Name: Dat Derksen MRN: [id number] Date of Birth: 07/09/1965 Referring Provider: Arnoldo Morale  Encounter Date: 07/05/2016      End of Session - 07/05/16 1423    Visit Number 4   Number of Visits 17   Date for SLP Re-Evaluation 09/02/16   SLP Start Time 1103   SLP Stop Time  1145   SLP Time Calculation (min) 42 min   Activity Tolerance Patient tolerated treatment well      Past Medical History:  Diagnosis Date  . Skin cancer     Past Surgical History:  Procedure Laterality Date  . EP IMPLANTABLE DEVICE N/A 12/15/2015   Procedure: Loop Recorder Insertion;  Surgeon: Will Meredith Leeds, MD;  Location: [place] CV [facility];  Service: Cardiovascular;  Laterality: N/A;  . IR GENERIC HISTORICAL  12/08/2015   IR PERCUTANEOUS ART THROMBECTOMY/INFUSION INTRACRANIAL INC DIAG ANGIO 12/08/2015 Luanne Bras, MD [facility]-INTERV RAD  . RADIOLOGY WITH ANESTHESIA Right 12/08/2015   Procedure: RADIOLOGY WITH ANESTHESIA - CODE STROKE;  Surgeon: Medication Radiologist, MD;  Location: [place];  Service: Radiology;  Laterality: Right;  . SKIN SURGERY    . TEE WITHOUT CARDIOVERSION N/A 12/15/2015   Procedure: TRANSESOPHAGEAL ECHOCARDIOGRAM (TEE);  Surgeon: Sueanne Margarita, MD;  Location[facility] ENDOSCOPY;  Service: Cardiovascular;  Laterality: N/A;    There were no vitals filed for this visit.      Subjective Assessment - 07/05/16 1109    Subjective Pt with interpreter. Reports homework helped "a little". Pt does not report improvement in verbal expression since last visit.   Currently in Pain? No/denies               ADULT SLP TREATMENT - 07/05/16 1110      General Information   Behavior/Cognition Alert;Cooperative;Pleasant mood     Treatment Provided   Treatment  provided Cognitive-Linquistic     Cognitive-Linquistic Treatment   Treatment focused on Aphasia   Skilled Treatment To target pt's anomia and sentence construction SLP had pt ID specific objects/items given 3 clues - pt 90% successful (simple common objects 85% of the time). Additionally pt was asked to name three things needed for completing a task and was 80% successful with naming 3 things. Lastly pt had to produce descriptive phrases and words so that SLP could guess objects.- pt did so with rare min A for common objects, but req'd occ mod A for less common item description.      Assessment / Recommendations / Plan   Plan Continue with current plan of care     Progression Toward Goals   Progression toward goals Progressing toward goals            SLP Short Term Goals - 07/05/16 1425      SLP SHORT TERM GOAL #1   Title pt will complete mod complex descriptions with modifed independence (compensations) over three sessions   Time 3   Period Weeks   Status On-going     SLP SHORT TERM GOAL #2   Title pt will functionally participate in simple conversaiton for 10 minutes without communication breakdown, over two sessions   Time 3   Period Weeks   Status On-going          SLP Long Term Goals - 07/05/16 1425      SLP LONG  TERM GOAL #1   Title Pt will produce 5 minutes of mod complex conversation with modified indpendence (compensations) over two sessions   Time 7   Period Weeks   Status On-going          Plan - 07/05/16 1424    Clinical Impression Statement Pt cont'd to present today with expressive verbal aphasia affecting his ability to speak Spanish and [place] fluently. He was reminded of compensatory strategies today, and req'd SLP cues to use drawing when Spanish word was not generated for "sink". Pt would cont to benefit from skilled ST to improve his verbal communication at home and in the community. SLP to contact pt's MD to suggest referral to ENT.   Speech  Therapy Frequency 2x / week   Duration --  8 weeks or 17 visits   Treatment/Interventions Language facilitation;Compensatory techniques;Internal/external aids;SLP instruction and feedback;Multimodal communcation approach;Functional tasks;Patient/family education;Cueing hierarchy   Potential to Achieve Goals Good   Consulted and Agree with Plan of Care Patient      Patient will benefit from skilled therapeutic intervention in order to improve the following deficits and impairments:   Aphasia    Problem List Patient Active Problem List   Diagnosis Date Noted  . Dupuytren's contracture of right hand 02/18/2016  . Gait disturbance, post-stroke 01/04/2016  . Hemiparesis affecting right side as late effect of cerebrovascular accident (CVA) ([place]) 01/04/2016  . Cerebral infarction due to thrombosis of left middle cerebral artery ([place]) 12/15/2015  . Left middle cerebral artery stroke ([place]) 12/15/2015  . Oropharyngeal dysphagia   . Aphasia as late effect of stroke   . Right hemiplegia ([place])   . Acute on chronic respiratory failure ([place])   . Essential hypertension   . CVA (cerebral vascular accident) ([place]) 12/08/2015    [facility] ,[place], [place]  07/05/2016, 2:25 PM  [place] [address] [place], [place], [address] Phone: [phone]   Fax:  [phone]   Name: Ayomide Purdy MRN: [id number] Date of Birth: October 16, 1965

## 2016-07-05 NOTE — Patient Instructions (Signed)
  Please complete the assigned speech therapy homework prior to your next session and return it to the speech therapist at your next visit.  

## 2016-07-07 ENCOUNTER — Ambulatory Visit: Payer: No Typology Code available for payment source

## 2016-07-07 DIAGNOSIS — R4701 Aphasia: Secondary | ICD-10-CM

## 2016-07-07 NOTE — Patient Instructions (Addendum)
This was provided in Spanish for pt: ========================================== Compensations for word finding: Describing   Rephrasing the sentence or thought  Finding a synonym  Gesture  Drawing   Please complete the assigned speech therapy homework prior to your next session and return it to the speech therapist at your next visit.

## 2016-07-07 NOTE — Therapy (Signed)
[facility] [address] [place], [place], [address] Phone: [phone]   Fax:  [phone]  Speech Language Pathology Treatment  Patient Details  Name: James Cortez MRN: [id number] Date of Birth: [birth year] Referring Provider: Arnoldo Morale  Encounter Date: 07/07/2016      End of Session - 07/07/16 1130    Visit Number 5   Number of Visits 17   Date for SLP Re-Evaluation 09/02/16   SLP Start Time 1103   SLP Stop Time  8101   SLP Time Calculation (min) 41 min      Past Medical History:  Diagnosis Date  . Skin cancer     Past Surgical History:  Procedure Laterality Date  . EP IMPLANTABLE DEVICE N/A 12/15/2015   Procedure: Loop Recorder Insertion;  Surgeon: Will Meredith Leeds, MD;  Location: [facility];  Service: Cardiovascular;  Laterality: N/A;  . IR GENERIC HISTORICAL  12/08/2015   IR PERCUTANEOUS ART THROMBECTOMY/INFUSION INTRACRANIAL INC DIAG ANGIO 12/08/2015 Luanne Bras, MD [facility]  . RADIOLOGY WITH ANESTHESIA Right 12/08/2015   Procedure: RADIOLOGY WITH ANESTHESIA - CODE STROKE;  Surgeon: Medication Radiologist, MD;  Location: [facility];  Service: Radiology;  Laterality: Right;  . SKIN SURGERY    . TEE WITHOUT CARDIOVERSION N/A 12/15/2015   Procedure: TRANSESOPHAGEAL ECHOCARDIOGRAM (TEE);  Surgeon: Sueanne Margarita, MD;  Location: [facility]OS[facility];  Service: Cardiovascular;  Laterality: N/A;    There were no vitals filed for this visit.      Subjective Assessment - 07/07/16 1110    Subjective Pt reports much practice yesterday with word finding. Asked if SLP would be looking at his vocal folds today - SLP told him an ENT would be able to do that.   Patient is accompained by: Interpreter   Currently in Pain? No/denies               ADULT SLP TREATMENT - 07/07/16 1114      General Information   Behavior/Cognition Alert;Cooperative;Pleasant mood     Treatment  Provided   Treatment provided Cognitive-Linquistic     Cognitive-Linquistic Treatment   Treatment focused on Aphasia   Skilled Treatment Long discussion re: compensations as pt stated he had a situation with a [place] worker about MEdicaid and pt couldn't generate "Medicaid". SLP explained/reiterated pt could have used a compensation strategy in that situaiton. Additionally, pt expressed not understandign why he was not referred for OT eval. SLP explained difference between PT and OT (pt has PT referral).  In description task, pt appeared to search for words in native language rarely.      Assessment / Recommendations / Plan   Plan Continue with current plan of care     Progression Toward Goals   Progression toward goals Progressing toward goals          SLP Education - 07/07/16 1317    Education provided Yes   Education Details OT vs PT, compensations   Person(s) Educated Patient   Methods Explanation;Demonstration   Comprehension Verbalized understanding          SLP Short Term Goals - 07/07/16 1318      SLP SHORT TERM GOAL #1   Title pt will complete mod complex descriptions with modifed independence (compensations) over three sessions   Time 3   Period Weeks   Status On-going     SLP SHORT TERM GOAL #2   Title pt will functionally participate in simple conversaiton for 10 minutes  without communication breakdown, over two sessions   Time 3   Period Weeks   Status On-going          SLP Long Term Goals - 07/07/16 1318      SLP LONG TERM GOAL #1   Title Pt will produce 5 minutes of mod complex conversation with modified indpendence (compensations) over two sessions   Time 7   Period Weeks   Status On-going          Plan - 07/07/16 1318    Clinical Impression Statement Pt cont'd to present today with expressive verbal aphasia affecting his ability to speak Spanish and [place] fluently. He was reminded of compensatory strategies today, and req'd SLP cues  to use drawing when Spanish word was not generated for "sink". Pt would cont to benefit from skilled ST to improve his verbal communication at home and in the community. SLP to contact pt's MD to suggest referral to ENT.   Speech Therapy Frequency 2x / week   Duration --  8 weeks or 17 visits   Treatment/Interventions Language facilitation;Compensatory techniques;Internal/external aids;SLP instruction and feedback;Multimodal communcation approach;Functional tasks;Patient/family education;Cueing hierarchy   Potential to Achieve Goals Good   Consulted and Agree with Plan of Care Patient      Patient will benefit from skilled therapeutic intervention in order to improve the following deficits and impairments:   Aphasia    Problem List Patient Active Problem List   Diagnosis Date Noted  . Dupuytren's contracture of right hand 02/18/2016  . Gait disturbance, post-stroke 01/04/2016  . Hemiparesis affecting right side as late effect of cerebrovascular accident (CVA) ([place]) 01/04/2016  . Cerebral infarction due to thrombosis of left middle cerebral artery ([place]) 12/15/2015  . Left middle cerebral artery stroke ([place]) 12/15/2015  . Oropharyngeal dysphagia   . Aphasia as late effect of stroke   . Right hemiplegia ([place])   . Acute on chronic respiratory failure ([place])   . Essential hypertension   . CVA (cerebral vascular accident) ([place]) 12/08/2015    [facility] ,[place], [place]  07/07/2016, 1:18 PM  [facility] [address] [place], [place], [address] Phone: [phone]   Fax:  [phone]   Name: James Cortez MRN: [id number] Date of Birth: [birth year]

## 2016-07-12 ENCOUNTER — Ambulatory Visit (INDEPENDENT_AMBULATORY_CARE_PROVIDER_SITE_OTHER): Payer: No Typology Code available for payment source | Admitting: *Deleted

## 2016-07-12 DIAGNOSIS — I639 Cerebral infarction, unspecified: Secondary | ICD-10-CM

## 2016-07-13 NOTE — Progress Notes (Signed)
Carelink Summary Report / Loop Recorder 

## 2016-07-14 ENCOUNTER — Ambulatory Visit: Payer: No Typology Code available for payment source | Attending: Family Medicine

## 2016-07-14 DIAGNOSIS — R4701 Aphasia: Secondary | ICD-10-CM | POA: Insufficient documentation

## 2016-07-14 DIAGNOSIS — R278 Other lack of coordination: Secondary | ICD-10-CM | POA: Insufficient documentation

## 2016-07-14 DIAGNOSIS — I69351 Hemiplegia and hemiparesis following cerebral infarction affecting right dominant side: Secondary | ICD-10-CM | POA: Insufficient documentation

## 2016-07-14 DIAGNOSIS — M25511 Pain in right shoulder: Secondary | ICD-10-CM | POA: Insufficient documentation

## 2016-07-14 DIAGNOSIS — R2681 Unsteadiness on feet: Secondary | ICD-10-CM | POA: Insufficient documentation

## 2016-07-14 DIAGNOSIS — M25611 Stiffness of right shoulder, not elsewhere classified: Secondary | ICD-10-CM | POA: Insufficient documentation

## 2016-07-14 NOTE — Therapy (Signed)
[facility] [address] [place], [place], [address] Phone: [phone]   Fax:  [phone]  Speech Language Pathology Treatment  Patient Details  Name: James Cortez MRN: [id number] Date of Birth: Jun 09, 1965 Referring Provider: Arnoldo Morale  Encounter Date: 07/14/2016      End of Session - 07/14/16 1441    Visit Number 6   Number of Visits 17   Date for SLP Re-Evaluation 09/02/16   SLP Start Time 1325  10 minutes late   SLP Stop Time  50   SLP Time Calculation (min) 36 min   Activity Tolerance Treatment limited secondary to medical complications (Comment)      Past Medical History:  Diagnosis Date  . Skin cancer     Past Surgical History:  Procedure Laterality Date  . EP IMPLANTABLE DEVICE N/A 12/15/2015   Procedure: Loop Recorder Insertion;  Surgeon: Will Meredith Leeds, MD;  Location: [facility];  Service: Cardiovascular;  Laterality: N/A;  . IR GENERIC HISTORICAL  12/08/2015   IR PERCUTANEOUS ART THROMBECTOMY/INFUSION INTRACRANIAL INC DIAG ANGIO 12/08/2015 Luanne Bras, MD [facility]  . RADIOLOGY WITH ANESTHESIA Right 12/08/2015   Procedure: RADIOLOGY WITH ANESTHESIA - CODE STROKE;  Surgeon: Medication Radiologist, MD;  Location: [facility];  Service: Radiology;  Laterality: Right;  . SKIN SURGERY    . TEE WITHOUT CARDIOVERSION N/A 12/15/2015   Procedure: TRANSESOPHAGEAL ECHOCARDIOGRAM (TEE);  Surgeon: Sueanne Margarita, MD;  Location: [facility] ENDOSCOPY;  Service: Cardiovascular;  Laterality: N/A;    There were no vitals filed for this visit.      Subjective Assessment - 07/14/16 1329    Subjective Pt reports no changes in speech language since yesterday.   Patient is accompained by: Interpreter  and son   Currently in Pain? No/denies               ADULT SLP TREATMENT - 07/14/16 1330      General Information   Behavior/Cognition Alert;Cooperative;Pleasant mood     Treatment Provided   Treatment provided Cognitive-Linquistic     Cognitive-Linquistic Treatment   Treatment focused on Aphasia   Skilled Treatment SLP targeted pt's expressive language deficit/anomia by description tasks with pictures. SLP provided pt with ways to describe something (color, size, who uses it, location, function, shape). Pt with 5 notalbe episodes of anomia - pt did not attempt to use compensations > once. SLP stressed importance of homework comletion at home at least an hour/day.     Assessment / Recommendations / Plan   Plan Continue with current plan of care     Progression Toward Goals   Progression toward goals Progressing toward goals            SLP Short Term Goals - 07/14/16 1443      SLP SHORT TERM GOAL #1   Title pt will complete mod complex descriptions with modifed independence (compensations) over three sessions   Time 2   Period Weeks   Status On-going     SLP SHORT TERM GOAL #2   Title pt will functionally participate in simple conversaiton for 10 minutes without communication breakdown, over two sessions   Time 2   Period Weeks   Status On-going          SLP Long Term Goals - 07/14/16 1443      SLP LONG TERM GOAL #1   Title Pt will produce 5 minutes of mod complex conversation with modified indpendence (compensations) over two sessions  Time 6   Period Weeks   Status On-going          Plan - 07/14/16 1442    Clinical Impression Statement Pt cont'd to present today with expressive verbal aphasia affecting his ability to speak Spanish and [place] fluently. He did not use compensatory strategies today in anomic events during description tasks. Pt would cont to benefit from skilled ST to improve his verbal communication at home and in the community. SLP to contact pt's MD to suggest referral to ENT due to pt's hoarseness.   Speech Therapy Frequency 2x / week   Duration --  8 weeks or 17 visits   Treatment/Interventions Language  facilitation;Compensatory techniques;Internal/external aids;SLP instruction and feedback;Multimodal communcation approach;Functional tasks;Patient/family education;Cueing hierarchy   Potential to Achieve Goals Good   Consulted and Agree with Plan of Care Patient      Patient will benefit from skilled therapeutic intervention in order to improve the following deficits and impairments:   Aphasia    Problem List Patient Active Problem List   Diagnosis Date Noted  . Dupuytren's contracture of right hand 02/18/2016  . Gait disturbance, post-stroke 01/04/2016  . Hemiparesis affecting right side as late effect of cerebrovascular accident (CVA) ([place]) 01/04/2016  . Cerebral infarction due to thrombosis of left middle cerebral artery ([place]) 12/15/2015  . Left middle cerebral artery stroke ([place]) 12/15/2015  . Oropharyngeal dysphagia   . Aphasia as late effect of stroke   . Right hemiplegia ([place])   . Acute on chronic respiratory failure ([place])   . Essential hypertension   . CVA (cerebral vascular accident) ([place]) 12/08/2015    [facility] ,[place], [place]  07/14/2016, 2:44 PM  [facility] [address] [place], [place], [address] Phone: [phone]   Fax:  [phone]   Name: James Cortez MRN: [id number] Date of Birth: 05/22/1965

## 2016-07-15 ENCOUNTER — Ambulatory Visit: Payer: No Typology Code available for payment source

## 2016-07-15 DIAGNOSIS — R4701 Aphasia: Secondary | ICD-10-CM

## 2016-07-15 NOTE — Patient Instructions (Signed)
  Please complete the assigned speech therapy homework prior to your next session and return it to the speech therapist at your next visit.  

## 2016-07-15 NOTE — Therapy (Signed)
[facility] [address] [place], [place], [address] Phone: [phone]   Fax:  [phone]  Speech Language Pathology Treatment  Patient Details  Name: James Cortez MRN: [id number] Date of Birth: 06-04-1965 Referring Provider: Arnoldo Morale  Encounter Date: 07/15/2016      End of Session - 07/15/16 0930    Visit Number 7   Number of Visits 17   Date for SLP Re-Evaluation 09/02/16   SLP Start Time 0850   SLP Stop Time  0930   SLP Time Calculation (min) 40 min   Activity Tolerance Patient tolerated treatment well      Past Medical History:  Diagnosis Date  . Skin cancer     Past Surgical History:  Procedure Laterality Date  . EP IMPLANTABLE DEVICE N/A 12/15/2015   Procedure: Loop Recorder Insertion;  Surgeon: Will Meredith Leeds, MD;  Location: [facility];  Service: Cardiovascular;  Laterality: N/A;  . IR GENERIC HISTORICAL  12/08/2015   IR PERCUTANEOUS ART THROMBECTOMY/INFUSION INTRACRANIAL INC DIAG ANGIO 12/08/2015 Luanne Bras, MD [facility]  . RADIOLOGY WITH ANESTHESIA Right 12/08/2015   Procedure: RADIOLOGY WITH ANESTHESIA - CODE STROKE;  Surgeon: Medication Radiologist, MD;  Location: [facility];  Service: Radiology;  Laterality: Right;  . SKIN SURGERY    . TEE WITHOUT CARDIOVERSION N/A 12/15/2015   Procedure: TRANSESOPHAGEAL ECHOCARDIOGRAM (TEE);  Surgeon: Sueanne Margarita, MD;  Location: [facility]OSCOPY;  Service: Cardiovascular;  Laterality: N/A;    There were no vitals filed for this visit.      Subjective Assessment - 07/15/16 0853    Subjective Pt reports no changes in speech language since last session.   Patient is accompained by: Interpreter   Currently in Pain? Yes   Pain Location Shoulder   Pain Orientation Right   Pain Type Acute pain               ADULT SLP TREATMENT - 07/15/16 0001      General Information   Behavior/Cognition  Alert;Cooperative;Pleasant mood     Treatment Provided   Treatment provided Cognitive-Linquistic     Cognitive-Linquistic Treatment   Treatment focused on Aphasia   Skilled Treatment SLP discussed with pt (via interpreter) nature of his shoulder pain and his voice hoarseness. In this 15 minute conversation, pt with word finding difficulties in Spanish evident to this SLP occurring occasionally.  In structured tasks pt req'd extra time consistently but appeared to use compensations in that extra time for functional communication.      Assessment / Recommendations / Plan   Plan Continue with current plan of care     Progression Toward Goals   Progression toward goals Progressing toward goals          SLP Education - 07/15/16 0929    Education provided Yes   Education Details OT vs. PT, need for OT eval and ENT eval   Person(s) Educated Patient   Methods Explanation   Comprehension Verbalized understanding          SLP Short Term Goals - 07/15/16 0931      SLP SHORT TERM GOAL #1   Title pt will complete mod complex descriptions with modifed independence (compensations) over three sessions   Baseline 07-15-16   Time 2   Period Weeks   Status On-going     SLP SHORT TERM GOAL #2   Title pt will functionally participate in simple conversaiton for 10 minutes without communication breakdown, over two sessions  Time 2   Period Weeks   Status On-going          SLP Long Term Goals - 07/15/16 0932      SLP LONG TERM GOAL #1   Title Pt will produce 5 minutes of mod complex conversation with modified indpendence (compensations) over two sessions   Time 6   Period Weeks   Status On-going          Plan - 07/15/16 0930    Clinical Impression Statement Pt cont'd to present today with expressive verbal aphasia affecting his ability to speak Spanish and [place] fluently. He appeared to use compensatory strategies today in structured tasks. Pt would cont to benefit from skilled  ST to improve his verbal communication at home and in the community. SLP contacted pt's MD to suggest referral to ENT due to pt's hoarseness and OT eval due to shoulder pain/had weakness.   Speech Therapy Frequency 2x / week   Duration --  8 weeks   Treatment/Interventions Language facilitation;Compensatory techniques;Internal/external aids;SLP instruction and feedback;Multimodal communcation approach;Functional tasks;Patient/family education;Cueing hierarchy   Potential to Achieve Goals Good      Patient will benefit from skilled therapeutic intervention in order to improve the following deficits and impairments:   Aphasia    Problem List Patient Active Problem List   Diagnosis Date Noted  . Dupuytren's contracture of right hand 02/18/2016  . Gait disturbance, post-stroke 01/04/2016  . Hemiparesis affecting right side as late effect of cerebrovascular accident (CVA) ([place]) 01/04/2016  . Cerebral infarction due to thrombosis of left middle cerebral artery ([place]) 12/15/2015  . Left middle cerebral artery stroke ([place]) 12/15/2015  . Oropharyngeal dysphagia   . Aphasia as late effect of stroke   . Right hemiplegia ([place])   . Acute on chronic respiratory failure ([place])   . Essential hypertension   . CVA (cerebral vascular accident) ([place]) 12/08/2015    [facility] ,[place], [place]  07/15/2016, 9:32 AM  [facility] [address] [place], [place], [address] Phone: [phone]   Fax:  [phone]   Name: James Cortez MRN: [id number] Date of Birth: 09-Sep-1965

## 2016-07-18 ENCOUNTER — Ambulatory Visit: Payer: No Typology Code available for payment source

## 2016-07-18 DIAGNOSIS — R4701 Aphasia: Secondary | ICD-10-CM

## 2016-07-18 LAB — CUP PACEART REMOTE DEVICE CHECK
Date Time Interrogation Session: 20180605201154
Implantable Pulse Generator Implant Date: 20171107

## 2016-07-18 NOTE — Therapy (Signed)
[facility] [address] [place], [place], [address] Phone: [phone]   Fax:  [phone]  Speech Language Pathology Treatment  Patient Details  Name: James Cortez MRN: [id number] Date of Birth: May 04, 1965 Referring Provider: Arnoldo Morale  Encounter Date: 07/18/2016      End of Session - 07/18/16 0849    Visit Number 8   Number of Visits 17   Date for SLP Re-Evaluation 09/02/16   SLP Start Time 0803   SLP Stop Time  0845   SLP Time Calculation (min) 42 min   Activity Tolerance Patient tolerated treatment well      Past Medical History:  Diagnosis Date  . Skin cancer     Past Surgical History:  Procedure Laterality Date  . EP IMPLANTABLE DEVICE N/A 12/15/2015   Procedure: Loop Recorder Insertion;  Surgeon: Will Meredith Leeds, MD;  Location: [facility];  Service: Cardiovascular;  Laterality: N/A;  . IR GENERIC HISTORICAL  12/08/2015   IR PERCUTANEOUS ART THROMBECTOMY/INFUSION INTRACRANIAL INC DIAG ANGIO 12/08/2015 Luanne Bras, MD [facility]  . RADIOLOGY WITH ANESTHESIA Right 12/08/2015   Procedure: RADIOLOGY WITH ANESTHESIA - CODE STROKE;  Surgeon: Medication Radiologist, MD;  Location: [facility];  Service: Radiology;  Laterality: Right;  . SKIN SURGERY    . TEE WITHOUT CARDIOVERSION N/A 12/15/2015   Procedure: TRANSESOPHAGEAL ECHOCARDIOGRAM (TEE);  Surgeon: Sueanne Margarita, MD;  Location: [facility] ENDOSCOPY;  Service: Cardiovascular;  Laterality: N/A;    There were no vitals filed for this visit.      Subjective Assessment - 07/18/16 0807    Subjective Pt reports no changes in speech or lnaguage over the weekend.   Patient is accompained by: Interpreter   Currently in Pain? Yes   Pain Score 3    Pain Location Hand   Pain Orientation Right   Pain Descriptors / Indicators Sharp;Sore   Pain Type Chronic pain  post CVA   Pain Onset More than a month ago   Pain Frequency Constant    Aggravating Factors  moving it   Pain Relieving Factors keeping it still               ADULT SLP TREATMENT - 07/18/16 0810      General Information   Behavior/Cognition Alert;Cooperative;Pleasant mood     Treatment Provided   Treatment provided Cognitive-Linquistic     Cognitive-Linquistic Treatment   Treatment focused on Aphasia   Skilled Treatment Sentence level responses resulted in pt with rare suspected anomic episodes. Pt again raised concern about his voice and SLP educated him with some general voice hygiene (hydration, vocal rest when voice notably tired). SLP will call pt's PCP today to verify she knows aobut suggested referrals. Pt is satisfied with language gains and states he can cont to work at home. Discharge next visit.     Assessment / Recommendations / Plan   Plan Continue with current plan of care     Progression Toward Goals   Progression toward goals Progressing toward goals          SLP Education - 07/18/16 0848    Education provided Yes   Education Details general voice hygiene   Person(s) Educated Patient   Methods Explanation   Comprehension Verbalized understanding          SLP Short Term Goals - 07/18/16 0954      SLP SHORT TERM GOAL #1   Title pt will complete mod complex descriptions with modifed  independence (compensations) over three sessions   Baseline 07-15-16, 07-18-16   Time 1   Period Weeks   Status On-going     SLP SHORT TERM GOAL #2   Title pt will functionally participate in simple conversaiton for 10 minutes without communication breakdown, over two sessions   Baseline 07-18-16   Time 1   Period Weeks   Status On-going          SLP Long Term Goals - 07/18/16 0955      SLP LONG TERM GOAL #1   Title Pt will produce 5 minutes of mod complex conversation with modified indpendence (compensations) over two sessions   Baseline 07-18-16   Time 5   Period Weeks   Status On-going          Plan - 07/18/16 0849     Clinical Impression Statement Pt cont'd to present today with mild expressive verbal aphasia affecting his ability to speak Spanish and [place] fluently, however improved over last week. He used compensatory strategies today in structured tasks (circumlocution). Pt would cont to benefit from skilled ST to improve his verbal communication at home and in the community. Pt agrees dischage is appropriate next session      Patient will benefit from skilled therapeutic intervention in order to improve the following deficits and impairments:   Aphasia    Problem List Patient Active Problem List   Diagnosis Date Noted  . Dupuytren's contracture of right hand 02/18/2016  . Gait disturbance, post-stroke 01/04/2016  . Hemiparesis affecting right side as late effect of cerebrovascular accident (CVA) ([place]) 01/04/2016  . Cerebral infarction due to thrombosis of left middle cerebral artery ([place]) 12/15/2015  . Left middle cerebral artery stroke ([place]) 12/15/2015  . Oropharyngeal dysphagia   . Aphasia as late effect of stroke   . Right hemiplegia ([place])   . Acute on chronic respiratory failure ([place])   . Essential hypertension   . CVA (cerebral vascular accident) ([place]) 12/08/2015    [facility] ,[name], [place]  07/18/2016, 9:55 AM  [facility] [address] [place], [place], [address] Phone: [phone]   Fax:  [phone]   Name: James Cortez MRN: [id number] Date of Birth: 06/02/1965

## 2016-07-18 NOTE — Progress Notes (Signed)
Carelink summary report received. Battery status OK. Normal device function. No new symptom episodes, tachy episodes, brady, or pause episodes. No new AF episodes. Monthly summary reports and ROV/PRN 

## 2016-07-19 ENCOUNTER — Other Ambulatory Visit: Payer: Self-pay | Admitting: Family Medicine

## 2016-07-19 DIAGNOSIS — R490 Dysphonia: Secondary | ICD-10-CM

## 2016-07-19 DIAGNOSIS — R1312 Dysphagia, oropharyngeal phase: Secondary | ICD-10-CM

## 2016-07-19 DIAGNOSIS — G8191 Hemiplegia, unspecified affecting right dominant side: Secondary | ICD-10-CM

## 2016-07-20 ENCOUNTER — Ambulatory Visit: Payer: No Typology Code available for payment source

## 2016-07-20 DIAGNOSIS — R4701 Aphasia: Secondary | ICD-10-CM

## 2016-07-20 NOTE — Therapy (Signed)
[facility] [address] [place], [place], [address] Phone: [phone]   Fax:  [phone]  Speech Language Pathology Treatment  Patient Details  Name: Secundino Ellithorpe MRN: [id number] Date of Birth: 1965/06/16 Referring Provider: Arnoldo Morale  Encounter Date: 07/20/2016      End of Session - 07/20/16 0959    Visit Number 9   Number of Visits 17   Date for SLP Re-Evaluation 09/02/16   SLP Start Time 0850   SLP Stop Time  0931   SLP Time Calculation (min) 41 min   Activity Tolerance Patient tolerated treatment well      Past Medical History:  Diagnosis Date  . Skin cancer     Past Surgical History:  Procedure Laterality Date  . EP IMPLANTABLE DEVICE N/A 12/15/2015   Procedure: Loop Recorder Insertion;  Surgeon: Will Meredith Leeds, MD;  Location: [facility];  Service: Cardiovascular;  Laterality: N/A;  . IR GENERIC HISTORICAL  12/08/2015   IR PERCUTANEOUS ART THROMBECTOMY/INFUSION INTRACRANIAL INC DIAG ANGIO 12/08/2015 Luanne Bras, MD [facility]INTER[facility] RAD  . RADIOLOGY WITH ANESTHESIA Right 12/08/2015   Procedure: RADIOLOGY WITH ANESTHESIA - CODE STROKE;  Surgeon: Medication Radiologist, MD;  Location: [facility];  Service: Radiology;  Laterality: Right;  . SKIN SURGERY    . TEE WITHOUT CARDIOVERSION N/A 12/15/2015   Procedure: TRANSESOPHAGEAL ECHOCARDIOGRAM (TEE);  Surgeon: Sueanne Margarita, MD;  Location: [facility];  Service: Cardiovascular;  Laterality: N/A;    There were no vitals filed for this visit.             ADULT SLP TREATMENT - 07/20/16 0903      General Information   Behavior/Cognition Alert;Cooperative;Pleasant mood     Treatment Provided   Treatment provided Cognitive-Linquistic     Cognitive-Linquistic Treatment   Treatment focused on Aphasia   Skilled Treatment Pt reports talking with his boss yesterday and he reports he slowed down his rate of speech and was  much more successful with the fluidity of his spoken message.  In opinion stimuli this session SLP targeted both sentence formulation and pt's ability to use compensatory measures. Pt used slowed rate and was very successful at fluidity of message, as much as SLP could detect with pt speaking in [place].      Assessment / Recommendations / Plan   Plan Discharge SLP treatment due to (comment)     Progression Toward Goals   Progression toward goals --  d/c - see goal update            SLP Short Term Goals - 07/20/16 0958      SLP SHORT TERM GOAL #1   Title pt will complete mod complex descriptions with modifed independence (compensations) over three sessions   Status Achieved     SLP SHORT TERM GOAL #2   Title pt will functionally participate in simple conversaiton for 10 minutes without communication breakdown, over two sessions   Status Achieved          SLP Long Term Goals - 07/20/16 0953      SLP LONG TERM GOAL #1   Title Pt will produce 5 minutes of mod complex conversation with modified indpendence (compensations) over two sessions   Status Achieved          Plan - 07/20/16 1000    Clinical Impression Statement Pt cont'd to present today with  improved verbal expression over last week mostly due to use of slower rate. Pt  agrees with dischage today as he has met goals and is satisfied with speech/language and would like to focus on OT at this time (eval scheduled 08-01-16).    Treatment/Interventions Language facilitation;Compensatory techniques;Internal/external aids;SLP instruction and feedback;Multimodal communcation approach;Functional tasks;Patient/family education;Cueing hierarchy   Potential to Achieve Goals Good      Patient will benefit from skilled therapeutic intervention in order to improve the following deficits and impairments:   Aphasia   SPEECH THERAPY DISCHARGE SUMMARY  Visits from Start of Care: 9   Current functional level related to goals /  functional outcomes: Pt made most progress with expressive language in his native language, Spanish. Pt reports he is pleased with progress with speech and language at this time and would like to focus his efforts on reduction of shoulder pain and incr'd shoulder mobility, as well as incr'd hand strength. 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 With compensatory measures SLP assesses pt's verbal expression as being at least functional and likely near-normal in his native language.    Remaining deficits: Minor expressive aphasia, minimized with compensatory techniques.   Education / Equipment: Compensations for aphasia, home tasks.  Plan: Patient agrees to discharge.  Patient goals were met. Patient is being discharged due to meeting the stated rehab goals.  ?????and pt pleased with progress with ST.       Problem List Patient Active Problem List   Diagnosis Date Noted  . Dupuytren's contracture of right hand 02/18/2016  . Gait disturbance, post-stroke 01/04/2016  . Hemiparesis affecting right side as late effect of cerebrovascular accident (CVA) ([place]) 01/04/2016  . Cerebral infarction due to thrombosis of left middle cerebral artery ([place]) 12/15/2015  . Left middle cerebral artery stroke ([place]) 12/15/2015  . Oropharyngeal dysphagia   . Aphasia as late effect of stroke   . Right hemiplegia ([place])   . Acute on chronic respiratory failure ([place])   . Essential hypertension   . CVA (cerebral vascular accident) ([place]) 12/08/2015    [facility] ,[place], [place]  07/20/2016, 10:06 AM  [facility] [address] [place], [place], [address] Phone: [phone]   Fax:  [phone]   Name: Everet Flagg MRN: [id number] Date of Birth: [birth year]

## 2016-07-25 ENCOUNTER — Encounter: Payer: No Typology Code available for payment source | Admitting: Speech Pathology

## 2016-07-26 ENCOUNTER — Encounter: Payer: No Typology Code available for payment source | Admitting: Speech Pathology

## 2016-08-01 ENCOUNTER — Ambulatory Visit: Payer: No Typology Code available for payment source | Admitting: Occupational Therapy

## 2016-08-01 DIAGNOSIS — M25611 Stiffness of right shoulder, not elsewhere classified: Secondary | ICD-10-CM

## 2016-08-01 DIAGNOSIS — R278 Other lack of coordination: Secondary | ICD-10-CM

## 2016-08-01 DIAGNOSIS — I69351 Hemiplegia and hemiparesis following cerebral infarction affecting right dominant side: Secondary | ICD-10-CM

## 2016-08-01 DIAGNOSIS — M25511 Pain in right shoulder: Secondary | ICD-10-CM

## 2016-08-01 DIAGNOSIS — R2681 Unsteadiness on feet: Secondary | ICD-10-CM

## 2016-08-01 NOTE — Therapy (Signed)
[facility] [address] [place], [place], [address] Phone: [phone]   Fax:  [phone]  Occupational Therapy Evaluation  Patient Details  Name: James Cortez MRN: [id number] Date of Birth: [birth year] Referring Provider: Dr. Arnoldo Morale  Encounter Date: 08/01/2016      OT End of Session - 08/01/16 1907    Visit Number 1   Number of Visits 17   Date for OT Re-Evaluation 09/30/16   Authorization Type [employer] 100% coverage   Authorization Time Period covered through 10/12/16    OT Start Time 0815   OT Stop Time 0842   OT Time Calculation (min) 27 min   Activity Tolerance Patient tolerated treatment well   Behavior During Therapy [facility] for tasks assessed/performed      Past Medical History:  Diagnosis Date  . Skin cancer     Past Surgical History:  Procedure Laterality Date  . EP IMPLANTABLE DEVICE N/A 12/15/2015   Procedure: Loop Recorder Insertion;  Surgeon: Will Meredith Leeds, MD;  Location: [facility];  Service: Cardiovascular;  Laterality: N/A;  . IR GENERIC HISTORICAL  12/08/2015   IR PERCUTANEOUS ART THROMBECTOMY/INFUSION INTRACRANIAL INC DIAG ANGIO 12/08/2015 Luanne Bras, MD [facility]INTER[facility]  . RADIOLOGY WITH ANESTHESIA Right 12/08/2015   Procedure: RADIOLOGY WITH ANESTHESIA - CODE STROKE;  Surgeon: Medication Radiologist, MD;  Location: [facility];  Service: Radiology;  Laterality: Right;  . SKIN SURGERY    . TEE WITHOUT CARDIOVERSION N/A 12/15/2015   Procedure: TRANSESOPHAGEAL ECHOCARDIOGRAM (TEE);  Surgeon: Sueanne Margarita, MD;  Location: [facility];  Service: Cardiovascular;  Laterality: N/A;    There were no vitals filed for this visit.      Subjective Assessment - 08/01/16 0817    Subjective  Pt reports that it hurts all the time   Patient is accompained by: Interpreter   Pertinent History L MCA distribution infarct, aphasia, R hemiparesis, R hand dupuytren's contracture,  HTN   Limitations native Spanish speaking, uses interpreter   Patient Stated Goals improve RUE pain and functional use, return to work   Currently in Pain? Yes   Pain Score 4   at rest 10/10 with movement   Pain Location --  shoulder   Pain Orientation Right   Pain Descriptors / Indicators --  unable to describe   Pain Type Chronic pain   Pain Onset More than a month ago   Pain Frequency Constant   Aggravating Factors  moving it    Pain Relieving Factors keeping it still, holding it close to chest   Effect of Pain on Daily Activities unable to return to work, limits daily activity, and dominant RUE functional use           [facility] OT Assessment - 08/01/16 0001      Assessment   Diagnosis R hemiparesis with R shoulder pain, s/p L MCA CVA   Referring Provider Dr. Arnoldo Morale   Onset Date 12/08/15   Prior Therapy no previous outpatient OT; outpatient PT eval and ST treatment     Balance Screen   Has the patient fallen in the past 6 months No     Home  Environment   Family/patient expects to be discharged to: Private residence   Lives With --  nephew (69 y.o.), and 45 y.o. son; child's mom also helps     Prior Function   Level of Independence Independent   Vocation Full time employment  Horticulturist, commercial   Leisure playing  with son (basketball)     ADL   Eating/Feeding --  difficulty eating with R hand   Grooming --  using both hands mod I   Lower Body Bathing Modified independent  with pain, using LUE more    Upper Body Dressing --  mod I with pain   Lower Body Dressing Modified independent   Toilet Transfer Modified independent   Toileting - Clothing Manipulation Modified independent   Toileting -  Hygiene Modified Independent   Tub/Shower Transfer Modified independent   Transfers/Ambulation Related to ADL's independent     IADL   Prior Level of Function Shopping independent    Shopping --  son's mom performing   Prior Level of Function Light Housekeeping  independent   Light Housekeeping --  performing light tasks with pain   Prior Level of Function Meal Prep independent   Meal Prep --  using LUE for lifting, mod I   Prior Level of Function Financial Management nephew and son's mother assisting (pt not working)     Public librarian Status Independent   Mobility Status Comments reports mild decr balance     Written Expression   Dominant Hand Right   Handwriting --  not assessed     Vision Assessment   Vision Assessment Vision not tested   Comment pt reports mild incr difficulty for near vision (had some difficulty prior)     Cognition   Overall Cognitive Status Within Functional Limits for tasks assessed  will be assessed further in functional context prn     Observation/Other Assessments   Observations noted IR of R shoulder and compensation patterns noted with all RUE movements/functional use     Sensation   Light Touch Appears Intact  per pt report     Coordination   9 Hole Peg Test Right;Left   Right 9 Hole Peg Test 37.25  R shoulder compensation noted   Box and Blocks R-46 blocks     ROM / Strength   AROM / PROM / Strength AROM     AROM   Overall AROM  Deficits   Overall AROM Comments impaired for R shoulder with pain.  Elbow, wrist, finger ROM appear grossly WNL  R 4th digit Dupuytren's contracture   AROM Assessment Site Shoulder   Right/Left Shoulder Right   Right Shoulder Flexion 95 Degrees   Right Shoulder ABduction 35 Degrees  with incr pain   Right Shoulder Internal Rotation --  approx 75%   Right Shoulder External Rotation --  approx 50% with pain     Strength   Overall Strength Deficits   Overall Strength Comments RUE proximal strength not tested due to pain     Hand Function   Right Hand Grip (lbs) 35   Left Hand Grip (lbs) 71                         OT Education - 08/01/16 1849    Education provided Yes   Education Details OT eval results/POC   Person(s) Educated  Patient  via interpreter   Methods Explanation          OT Short Term Goals - 08/01/16 1923      OT [place] #1   Title Pt will be independent with initial HEP.--check STGs 08/30/16   Time 4   Period Weeks   Status New     OT SHORT TERM GOAL #2   Title Pt will report  pain less than 6/10 for light BADLs.   Baseline 10/10 with movement   Time 4   Period Weeks   Status New     OT SHORT TERM GOAL #3   Title Pt will demo at least 105* R shoulder flex for light functional reaching.   Baseline 95*   Time 4   Period Weeks   Status New     OT SHORT TERM GOAL #4   Title Pt will demo at least 50* R shoulder abduction for lateral reaching/ADLs.   Baseline 35*   Time 4   Period Weeks   Status New     OT SHORT TERM GOAL #5   Title Pt will verbalize understanding of proper positioning of RUE/adaptions for ADLs to decr RUE pain.   Time 4   Period Weeks   Status New           OT Long Term Goals - 08/01/16 1929      OT LONG TERM GOAL #1   Title Pt will be independent with updated HEP.--check LTGs 09/30/16   Time 8   Period Weeks   Status New     OT LONG TERM GOAL #2   Title Pt will report pain less than 4/10 for light ADLs/IADLs.   Baseline 10/10 with movement   Time 8   Period Weeks   Status New     OT LONG TERM GOAL #3   Title Pt will demo at least 120* R shoulder flex for light functional reaching.   Baseline 95*   Time 8   Period Weeks   Status New     OT LONG TERM GOAL #4   Title Pt will demo at least 70* R shoulder abduction for lateral reaching/ADLs.   Baseline 35*   Time 8   Period Weeks   Status New     OT LONG TERM GOAL #5   Title Pt will improve R grip strength by at least 15lbs for gripping/lifting tasks.   Baseline R-35lbs   Time 8   Period Weeks   Status New     Long Term Additional Goals   Additional Long Term Goals Yes     OT LONG TERM GOAL #6   Title Pt will improve R hand coordination for ADLs as shown by completing 9-hole  peg test in 25sec or less.   Baseline 37.25sec   Time 8   Period Weeks   Status New               Plan - 08/01/16 1909    Clinical Impression Statement Pt presents today with R hemiparesis, decr coordination, decr strength, decr ROM, and pain resulting in decr dominant RUE functional use and ability to perform previous ADLs/IADLs including work.   Occupational Profile and client history currently impacting functional performance Pt is a 51 y.o. male s/p L MCA CVA with resulting R hemiparesis and R dominant-side shoulder pain.  Pt was working as a Horticulturist, commercial prior to CVA and desires to return to work, but is unable due to R shoulder pain/weakness.  Pt also needs to be able to care for 37 y.o. son.  Pt with PMH that includes HTN, aphasia, and Dupuytren's contracture R hand.     Occupational performance deficits (Please refer to evaluation for details): ADL's;IADL's;Work;Other;Leisure;Social Participation  caregiver to son   Rehab Potential Good   OT Frequency 2x / week   OT Duration 8 weeks  +eval   OT Treatment/Interventions Self-care/ADL training;Cryotherapy;Parrafin;Therapeutic  exercise;DME and/or AE instruction;Functional Mobility Training;Manual Therapy;Neuromuscular education;Splinting;Fluidtherapy;Ultrasound;Electrical Stimulation;Moist Heat;Contrast Bath;Energy conservation;Passive range of motion;Therapeutic exercises;Therapeutic activities;Patient/family education   Plan positioning of RUE, manual therapy to R shoulder, supine ball exercises as able   Clinical Decision Making Several treatment options, min-mod task modification necessary   Consulted and Agree with Plan of Care Patient      Patient will benefit from skilled therapeutic intervention in order to improve the following deficits and impairments:  Decreased activity tolerance, Decreased coordination, Decreased knowledge of use of DME, Decreased strength, Impaired UE functional use, Pain, Improper body mechanics,  Decreased range of motion, Decreased mobility, Impaired vision/preception, Decreased balance  Visit Diagnosis: Right shoulder pain, unspecified chronicity  Hemiplegia and hemiparesis following cerebral infarction affecting right dominant side (HCC)  Other lack of coordination  Stiffness of right shoulder, not elsewhere classified  Unsteadiness on feet    Problem List Patient Active Problem List   Diagnosis Date Noted  . Dupuytren's contracture of right hand 02/18/2016  . Gait disturbance, post-stroke 01/04/2016  . Hemiparesis affecting right side as late effect of cerebrovascular accident (CVA) ([place]) 01/04/2016  . Cerebral infarction due to thrombosis of left middle cerebral artery ([place]) 12/15/2015  . Left middle cerebral artery stroke ([place]) 12/15/2015  . Oropharyngeal dysphagia   . Aphasia as late effect of stroke   . Right hemiplegia ([place])   . Acute on chronic respiratory failure ([place])   . Essential hypertension   . CVA (cerebral vascular accident) [facility]) 12/08/2015    [facility] 08/01/2016, 7:38 PM  [facility] [address] [place], [place], [address] Phone: [phone]   Fax:  [phone]  Name: Doniven Vanpatten MRN: [id number] Date of Birth: 1965/05/28   Vianne Bulls, OTR/L [facility] [address] [place], [place]  [address] [phone] phone [phone] 08/01/16 7:39 PM

## 2016-08-02 ENCOUNTER — Ambulatory Visit: Payer: No Typology Code available for payment source | Admitting: Occupational Therapy

## 2016-08-02 ENCOUNTER — Telehealth: Payer: Self-pay | Admitting: Family Medicine

## 2016-08-02 DIAGNOSIS — R2681 Unsteadiness on feet: Secondary | ICD-10-CM

## 2016-08-02 DIAGNOSIS — M25611 Stiffness of right shoulder, not elsewhere classified: Secondary | ICD-10-CM

## 2016-08-02 DIAGNOSIS — I69351 Hemiplegia and hemiparesis following cerebral infarction affecting right dominant side: Secondary | ICD-10-CM

## 2016-08-02 DIAGNOSIS — M25511 Pain in right shoulder: Secondary | ICD-10-CM

## 2016-08-02 DIAGNOSIS — R278 Other lack of coordination: Secondary | ICD-10-CM

## 2016-08-02 MED FILL — ATORVASTATIN 20 MG TABLET: 20 | 30 days supply | Qty: 30 | Fill #2

## 2016-08-02 MED FILL — CLOPIDOGREL 75 MG TABLET: 75 | 30 days supply | Qty: 30 | Fill #2

## 2016-08-02 NOTE — Telephone Encounter (Signed)
Pt. Came to facility stating that his physical therapy had told him he had to speak with his PCP so that he can be referred to a voice specialist. Please f/u

## 2016-08-02 NOTE — Patient Instructions (Signed)
   Upper Extremity: [employer] on back holding ball on chest (elbows by your side). Push ball up to straighten elbows. Repeat 10 times per set. Rest Do 2 sets 1-2 times per day.    Sit-Up (Over Head)    Hold  ball down low with elbows straight.  Slowly raise arms as far as you can without pain. Repeat 10 times.  Rest.  Do 2 sets 1-2 per day.

## 2016-08-02 NOTE — Therapy (Signed)
[facility] [address] [place], [place], [address] Phone: [phone]   Fax:  [phone]  Occupational Therapy Treatment  Patient Details  Name: James Cortez MRN: [id number] Date of Birth: Jul 02, 1965 Referring Provider: Dr. Arnoldo Morale  Encounter Date: 08/02/2016      OT End of Session - 08/02/16 1309    Visit Number 2   Number of Visits 17   Date for OT Re-Evaluation 09/30/16   Authorization Type GCCN 100% coverage   Authorization Time Period covered through 10/12/16    OT Start Time 0850   OT Stop Time 0930   OT Time Calculation (min) 40 min   Activity Tolerance Patient tolerated treatment well   Behavior During Therapy [facility] for tasks assessed/performed      Past Medical History:  Diagnosis Date  . Skin cancer     Past Surgical History:  Procedure Laterality Date  . EP IMPLANTABLE DEVICE N/A 12/15/2015   Procedure: Loop Recorder Insertion;  Surgeon: Will Meredith Leeds, MD;  Location: [place] [facility];  Service: Cardiovascular;  Laterality: N/A;  . IR GENERIC HISTORICAL  12/08/2015   IR PERCUTANEOUS ART THROMBECTOMY/INFUSION INTRACRANIAL INC DIAG ANGIO 12/08/2015 Luanne Bras, MD [facility]-INTER[facility] RAD  . RADIOLOGY WITH ANESTHESIA Right 12/08/2015   Procedure: RADIOLOGY WITH ANESTHESIA - CODE STROKE;  Surgeon: Medication Radiologist, MD;  Location: [place];  Service: Radiology;  Laterality: Right;  . SKIN SURGERY    . TEE WITHOUT CARDIOVERSION N/A 12/15/2015   Procedure: TRANSESOPHAGEAL ECHOCARDIOGRAM (TEE);  Surgeon: Sueanne Margarita, MD;  Location: [facility] ENDOSCOPY;  Service: Cardiovascular;  Laterality: N/A;    There were no vitals filed for this visit.      Subjective Assessment - 08/02/16 1306    Subjective  It hurts when I move it   Patient is accompained by: Interpreter   Pertinent History L MCA distribution infarct, aphasia, R hemiparesis, R hand dupuytren's contracture, HTN   Limitations native Spanish speaking, uses interpreter   Patient Stated Goals improve RUE pain and functional use, return to work   Currently in Pain? Yes   Pain Score 4    Pain Location --  shoulder   Pain Orientation Right   Pain Descriptors / Indicators --  unable to describe   Pain Type Chronic pain   Pain Onset More than a month ago   Pain Frequency Constant   Aggravating Factors  moving it    Pain Relieving Factors keeping it still        Manual:  In supine, soft tissue mobs and myofascial release to R shoulder with gentle joint mobs as tolerated.   Also discussed upcoming Physiatrist appt and importance of pt keeping that appt in July due to R shoulder pain.  (initially pt did not know who this was and did not think that he had seen this MD previously, but after explanation, pt agreed)                     OT Education - 08/02/16 1307    Education Details Proper positioning of RUE in sitting, standing, in bed (and handout issued), and with movement; initial HEP (ball shoulder flex/chest press in supine)   Person(s) Educated Patient;Other (comment)  via interpreter   Methods Explanation;Demonstration;Verbal cues;Handout   Comprehension Verbalized understanding;Returned demonstration;Verbal cues required          OT Short Term Goals - 08/01/16 1923      OT SHORT TERM GOAL #1  Title Pt will be independent with initial HEP.--check STGs 08/30/16   Time 4   Period Weeks   Status New     OT SHORT TERM GOAL #2   Title Pt will report pain less than 6/10 for light BADLs.   Baseline 10/10 with movement   Time 4   Period Weeks   Status New     OT SHORT TERM GOAL #3   Title Pt will demo at least 105* R shoulder flex for light functional reaching.   Baseline 95*   Time 4   Period Weeks   Status New     OT SHORT TERM GOAL #4   Title Pt will demo at least 50* R shoulder abduction for lateral reaching/ADLs.   Baseline 35*   Time 4   Period Weeks    Status New     OT SHORT TERM GOAL #5   Title Pt will verbalize understanding of proper positioning of RUE/adaptions for ADLs to decr RUE pain.   Time 4   Period Weeks   Status New           OT Long Term Goals - 08/01/16 1929      OT LONG TERM GOAL #1   Title Pt will be independent with updated HEP.--check LTGs 09/30/16   Time 8   Period Weeks   Status New     OT LONG TERM GOAL #2   Title Pt will report pain less than 4/10 for light ADLs/IADLs.   Baseline 10/10 with movement   Time 8   Period Weeks   Status New     OT LONG TERM GOAL #3   Title Pt will demo at least 120* R shoulder flex for light functional reaching.   Baseline 95*   Time 8   Period Weeks   Status New     OT LONG TERM GOAL #4   Title Pt will demo at least 70* R shoulder abduction for lateral reaching/ADLs.   Baseline 35*   Time 8   Period Weeks   Status New     OT LONG TERM GOAL #5   Title Pt will improve R grip strength by at least 15lbs for gripping/lifting tasks.   Baseline R-35lbs   Time 8   Period Weeks   Status New     Long Term Additional Goals   Additional Long Term Goals Yes     OT LONG TERM GOAL #6   Title Pt will improve R hand coordination for ADLs as shown by completing 9-hole peg test in 25sec or less.   Baseline 37.25sec   Time 8   Period Weeks   Status New               Plan - 08/02/16 1309    Clinical Impression Statement Pt with significant R shoulder pain with abnormal R shoulder posturing/positioning, but verbalized understanding of education provided regarding proper positioning.   Rehab Potential Good   OT Frequency 2x / week   OT Duration 8 weeks  +eval   OT Treatment/Interventions Self-care/ADL training;Cryotherapy;Parrafin;Therapeutic exercise;DME and/or AE instruction;Functional Mobility Training;Manual Therapy;Neuromuscular education;Splinting;Fluidtherapy;Ultrasound;Electrical Stimulation;Moist Heat;Contrast Bath;Energy conservation;Passive range of  motion;Therapeutic exercises;Therapeutic activities;Patient/family education   Plan continue with manual therapy/ultrasound to R shoulder, review proper positioning and initial HEP   Consulted and Agree with Plan of Care Patient      Patient will benefit from skilled therapeutic intervention in order to improve the following deficits and impairments:  Decreased activity tolerance, Decreased coordination,  Decreased knowledge of use of DME, Decreased strength, Impaired UE functional use, Pain, Improper body mechanics, Decreased range of motion, Decreased mobility, Impaired vision/preception, Decreased balance  Visit Diagnosis: Hemiplegia and hemiparesis following cerebral infarction affecting right dominant side (HCC)  Right shoulder pain, unspecified chronicity  Other lack of coordination  Stiffness of right shoulder, not elsewhere classified  Unsteadiness on feet    Problem List Patient Active Problem List   Diagnosis Date Noted  . Dupuytren's contracture of right hand 02/18/2016  . Gait disturbance, post-stroke 01/04/2016  . Hemiparesis affecting right side as late effect of cerebrovascular accident (CVA) ([place]) 01/04/2016  . Cerebral infarction due to thrombosis of left middle cerebral artery ([place]) 12/15/2015  . Left middle cerebral artery stroke ([place]) 12/15/2015  . Oropharyngeal dysphagia   . Aphasia as late effect of stroke   . Right hemiplegia ([place])   . Acute on chronic respiratory failure ([place])   . Essential hypertension   . CVA (cerebral vascular accident) [facility]) 12/08/2015    [facility] 08/02/2016, 7:32 PM  [facility] [address] [place], [place], [address] Phone: [phone]   Fax:  [phone]  Name: James Cortez MRN: [id number] Date of Birth: [birth year]   Vianne Bulls, OTR/L [facility] [address] [place], [place]  [address] [phone]  phone [phone] 08/02/16 7:35 PM

## 2016-08-03 NOTE — Telephone Encounter (Signed)
I received a message from PT and had placed a referral.

## 2016-08-03 NOTE — Telephone Encounter (Signed)
Please advise on this referral.

## 2016-08-09 ENCOUNTER — Ambulatory Visit: Payer: No Typology Code available for payment source | Attending: Family Medicine | Admitting: Occupational Therapy

## 2016-08-09 DIAGNOSIS — R2681 Unsteadiness on feet: Secondary | ICD-10-CM | POA: Insufficient documentation

## 2016-08-09 DIAGNOSIS — R278 Other lack of coordination: Secondary | ICD-10-CM | POA: Insufficient documentation

## 2016-08-09 DIAGNOSIS — M25511 Pain in right shoulder: Secondary | ICD-10-CM | POA: Insufficient documentation

## 2016-08-09 DIAGNOSIS — I69351 Hemiplegia and hemiparesis following cerebral infarction affecting right dominant side: Secondary | ICD-10-CM

## 2016-08-09 DIAGNOSIS — M25611 Stiffness of right shoulder, not elsewhere classified: Secondary | ICD-10-CM | POA: Insufficient documentation

## 2016-08-09 NOTE — Therapy (Signed)
[facility] [address] [place], [place], [address] Phone: [phone]   Fax:  [phone]  Occupational Therapy Treatment  Patient Details  Name: James Cortez MRN: [id number] Date of Birth: 1965-09-16 Referring Provider: Dr. Arnoldo Morale  Encounter Date: 08/09/2016      OT End of Session - 08/09/16 1002    Visit Number 3   Number of Visits 17   Date for OT Re-Evaluation 09/30/16   Authorization Type GCCN 100% coverage   Authorization Time Period covered through 10/12/16    OT Start Time 0851   OT Stop Time 0935   OT Time Calculation (min) 44 min   Activity Tolerance Patient tolerated treatment well   Behavior During Therapy [facility] for tasks assessed/performed      Past Medical History:  Diagnosis Date  . Skin cancer     Past Surgical History:  Procedure Laterality Date  . EP IMPLANTABLE DEVICE N/A 12/15/2015   Procedure: Loop Recorder Insertion;  Surgeon: Will Meredith Leeds, MD;  Location: [place] [facility];  Service: Cardiovascular;  Laterality: N/A;  . IR GENERIC HISTORICAL  12/08/2015   IR PERCUTANEOUS ART THROMBECTOMY/INFUSION INTRACRANIAL INC DIAG ANGIO 12/08/2015 Luanne Bras, MD [facility]-INTERV RAD  . RADIOLOGY WITH ANESTHESIA Right 12/08/2015   Procedure: RADIOLOGY WITH ANESTHESIA - CODE STROKE;  Surgeon: Medication Radiologist, MD;  Location: [facility];  Service: Radiology;  Laterality: Right;  . SKIN SURGERY    . TEE WITHOUT CARDIOVERSION N/A 12/15/2015   Procedure: TRANSESOPHAGEAL ECHOCARDIOGRAM (TEE);  Surgeon: Sueanne Margarita, MD;  Location: [facility] ENDOSCOPY;  Service: Cardiovascular;  Laterality: N/A;    There were no vitals filed for this visit.      Subjective Assessment - 08/09/16 0854    Subjective  It's been bad a night   Patient is accompained by: Interpreter   Pertinent History L MCA distribution infarct, aphasia, R hemiparesis, R hand dupuytren's contracture, HTN   Limitations  native Spanish speaking, uses interpreter   Patient Stated Goals improve RUE pain and functional use, return to work   Currently in Pain? Yes   Pain Score 4    Pain Location --  R shoulder    Pain Orientation Right   Pain Descriptors / Indicators --  unable to describe   Pain Type Chronic pain   Pain Onset More than a month ago   Pain Frequency Constant   Aggravating Factors  moving it   Pain Relieving Factors keeping it still and close to his body                       OT Treatments/Exercises (OP) - 08/09/16 0001      Modalities   Modalities Ultrasound;Cryotherapy     Cryotherapy   Number Minutes Cryotherapy 8 Minutes   Cryotherapy Location Shoulder  anterior   Type of Cryotherapy Ice massage     Ultrasound   Ultrasound Location R anterior shoulder   Ultrasound Parameters 41mhz, 1.0wts/cm2, 50% pulsed x8 min with no adverse reactions   Ultrasound Goals Pain      In supine, shoulder flex and chest press (HEP review) with BUEs with ball in pain-free range with min cueing for proper positioning.    Discussed pain and reviewed importance of proper positioning, moving within pain-free range, use of ice for pain, and following up with Dr. Letta Pate at upcoming appt.  Also recommended pt call MD to see if he can take an OTC pain med  to help with pain at night as pain is interfering with sleep.  Pt verbalized understanding (via interpreter).          OT Education - 08/09/16 2703    Education Details Pt instructed in table slides--see pt instructions; Reinforced/emphasized  that pt should only move within pain free range; use of ice for pain   Person(s) Educated Patient  interpreter present   Methods Explanation;Demonstration;Verbal cues;Handout   Comprehension Verbalized understanding;Returned demonstration;Verbal cues required          OT Short Term Goals - 08/01/16 1923      OT SHORT TERM GOAL #1   Title Pt will be independent with initial  HEP.--check STGs 08/30/16   Time 4   Period Weeks   Status New     OT SHORT TERM GOAL #2   Title Pt will report pain less than 6/10 for light BADLs.   Baseline 10/10 with movement   Time 4   Period Weeks   Status New     OT SHORT TERM GOAL #3   Title Pt will demo at least 105* R shoulder flex for light functional reaching.   Baseline 95*   Time 4   Period Weeks   Status New     OT SHORT TERM GOAL #4   Title Pt will demo at least 50* R shoulder abduction for lateral reaching/ADLs.   Baseline 35*   Time 4   Period Weeks   Status New     OT SHORT TERM GOAL #5   Title Pt will verbalize understanding of proper positioning of RUE/adaptions for ADLs to decr RUE pain.   Time 4   Period Weeks   Status New           OT Long Term Goals - 08/01/16 1929      OT LONG TERM GOAL #1   Title Pt will be independent with updated HEP.--check LTGs 09/30/16   Time 8   Period Weeks   Status New     OT LONG TERM GOAL #2   Title Pt will report pain less than 4/10 for light ADLs/IADLs.   Baseline 10/10 with movement   Time 8   Period Weeks   Status New     OT LONG TERM GOAL #3   Title Pt will demo at least 120* R shoulder flex for light functional reaching.   Baseline 95*   Time 8   Period Weeks   Status New     OT LONG TERM GOAL #4   Title Pt will demo at least 70* R shoulder abduction for lateral reaching/ADLs.   Baseline 35*   Time 8   Period Weeks   Status New     OT LONG TERM GOAL #5   Title Pt will improve R grip strength by at least 15lbs for gripping/lifting tasks.   Baseline R-35lbs   Time 8   Period Weeks   Status New     Long Term Additional Goals   Additional Long Term Goals Yes     OT LONG TERM GOAL #6   Title Pt will improve R hand coordination for ADLs as shown by completing 9-hole peg test in 25sec or less.   Baseline 37.25sec   Time 8   Period Weeks   Status New               Plan - 08/09/16 1002    Clinical Impression Statement Pt  continues to report significant shoulder pain.  However, pt performed HEP/updated HEP without pain today and reported no pain by end of session.   Rehab Potential Good   OT Frequency 2x / week   OT Duration 8 weeks  +eval   OT Treatment/Interventions Self-care/ADL training;Cryotherapy;Parrafin;Therapeutic exercise;DME and/or AE instruction;Functional Mobility Training;Manual Therapy;Neuromuscular education;Splinting;Fluidtherapy;Ultrasound;Electrical Stimulation;Moist Heat;Contrast Bath;Energy conservation;Passive range of motion;Therapeutic exercises;Therapeutic activities;Patient/family education   Plan continue with ultrasound to R shoulder; review HEP and continue reinforce proper positioning prn   Consulted and Agree with Plan of Care Patient      Patient will benefit from skilled therapeutic intervention in order to improve the following deficits and impairments:  Decreased activity tolerance, Decreased coordination, Decreased knowledge of use of DME, Decreased strength, Impaired UE functional use, Pain, Improper body mechanics, Decreased range of motion, Decreased mobility, Impaired vision/preception, Decreased balance  Visit Diagnosis: Hemiplegia and hemiparesis following cerebral infarction affecting right dominant side (HCC)  Right shoulder pain, unspecified chronicity  Other lack of coordination  Stiffness of right shoulder, not elsewhere classified  Unsteadiness on feet    Problem List Patient Active Problem List   Diagnosis Date Noted  . Dupuytren's contracture of right hand 02/18/2016  . Gait disturbance, post-stroke 01/04/2016  . Hemiparesis affecting right side as late effect of cerebrovascular accident (CVA) ([place]) 01/04/2016  . Cerebral infarction due to thrombosis of left middle cerebral artery ([place]) 12/15/2015  . Left middle cerebral artery stroke ([place]) 12/15/2015  . Oropharyngeal dysphagia   . Aphasia as late effect of stroke   . Right hemiplegia ([place])   .  Acute on chronic respiratory failure ([place])   . Essential hypertension   . CVA (cerebral vascular accident) ([place]) 12/08/2015    [facility] 08/09/2016, 10:13 AM  [facility] [address] [place], [place], [address] Phone: [phone]   Fax:  [phone]  Name: Davyon Fisch MRN: [id number] Date of Birth: Mar 25, 1965   Vianne Bulls, OTR/L [facility] [address] [place], [place]  [address] [phone] phone [phone] 08/09/16 10:13 AM

## 2016-08-09 NOTE — Patient Instructions (Signed)
   Place left arm on table top and slide it forward until a stretch is felt. Hold 3 seconds. Relax. Then go side to side.  Then in circles both directions. Repeat 15 times. Do 2 sessions per day.  Move only as far as you can without pain.     NO PAIN!!!    You can use ice to right shoulder for 10 min 2-3 times a day for pain.

## 2016-08-11 ENCOUNTER — Ambulatory Visit: Payer: No Typology Code available for payment source | Admitting: Occupational Therapy

## 2016-08-11 DIAGNOSIS — M25511 Pain in right shoulder: Secondary | ICD-10-CM

## 2016-08-11 DIAGNOSIS — M25611 Stiffness of right shoulder, not elsewhere classified: Secondary | ICD-10-CM

## 2016-08-11 DIAGNOSIS — I69351 Hemiplegia and hemiparesis following cerebral infarction affecting right dominant side: Secondary | ICD-10-CM

## 2016-08-11 DIAGNOSIS — R278 Other lack of coordination: Secondary | ICD-10-CM

## 2016-08-11 NOTE — Therapy (Signed)
[facility] [address] [place], [place], [address] Phone: [phone]   Fax:  [phone]  Occupational Therapy Treatment  Patient Details  Name: James Cortez MRN: [id number] Date of Birth: 1965-06-18 Referring Provider: Dr. Arnoldo Morale  Encounter Date: 08/11/2016      OT End of Session - 08/11/16 1144    Visit Number 4   Number of Visits 17   Date for OT Re-Evaluation 09/30/16   Authorization Type GCCN 100% coverage   Authorization Time Period covered through 10/12/16    OT Start Time 0850   OT Stop Time 0930   OT Time Calculation (min) 40 min   Activity Tolerance Patient tolerated treatment well   Behavior During Therapy [facility] for tasks assessed/performed      Past Medical History:  Diagnosis Date  . Skin cancer     Past Surgical History:  Procedure Laterality Date  . EP IMPLANTABLE DEVICE N/A 12/15/2015   Procedure: Loop Recorder Insertion;  Surgeon: Will Meredith Leeds, MD;  Location: [facility];  Service: Cardiovascular;  Laterality: N/A;  . IR GENERIC HISTORICAL  12/08/2015   IR PERCUTANEOUS ART THROMBECTOMY/INFUSION INTRACRANIAL INC DIAG ANGIO 12/08/2015 Luanne Bras, MD [facility]-INTERV RAD  . RADIOLOGY WITH ANESTHESIA Right 12/08/2015   Procedure: RADIOLOGY WITH ANESTHESIA - CODE STROKE;  Surgeon: Medication Radiologist, MD;  Location: [place];  Service: Radiology;  Laterality: Right;  . SKIN SURGERY    . TEE WITHOUT CARDIOVERSION N/A 12/15/2015   Procedure: TRANSESOPHAGEAL ECHOCARDIOGRAM (TEE);  Surgeon: Sueanne Margarita, MD;  Location: [facility] ENDOSCOPY;  Service: Cardiovascular;  Laterality: N/A;    There were no vitals filed for this visit.      Subjective Assessment - 08/11/16 0926    Subjective  Pt reports pain at night   Pertinent History L MCA distribution infarct, aphasia, R hemiparesis, R hand dupuytren's contracture, HTN   Limitations native Spanish speaking, uses  interpreter   Currently in Pain? Yes   Pain Score 5    Pain Location Shoulder   Pain Orientation Right   Pain Descriptors / Indicators Aching   Pain Type Chronic pain   Pain Onset More than a month ago   Pain Frequency Constant   Aggravating Factors  movement   Pain Relieving Factors rest, ice             OT Treatments/Exercises (OP) - 08/09/16 0001      Modalities   Modalities Ultrasound;Cryotherapy     Cryotherapy   Number Minutes Cryotherapy 5 Minutes   Cryotherapy Location Shoulder  anterior   Type of Cryotherapy Ice massage     Ultrasound   Ultrasound Location R anterior shoulder   Ultrasound Parameters 30mhz, 1.0wts/cm2, 50% pulsed x8 min with no adverse reactions   Ultrasound Goals Pain     Gentle joint mobs followed by pt performing scapular and shoulder retraction/ depression on mat In supine, shoulder flex and chest press (HEP review) with BUEs with ball in pain-free range with min cueing for proper positioning.    Discussed pain and reviewed importance of proper positioning, bed positioning, use of ice for pain, .  Pt verbalized understanding (via interpreter).                       OT Short Term Goals - 08/01/16 1923      OT SHORT TERM GOAL #1   Title Pt will be independent with initial HEP.--check STGs 08/30/16  Time 4   Period Weeks   Status New     OT SHORT TERM GOAL #2   Title Pt will report pain less than 6/10 for light BADLs.   Baseline 10/10 with movement   Time 4   Period Weeks   Status New     OT SHORT TERM GOAL #3   Title Pt will demo at least 105* R shoulder flex for light functional reaching.   Baseline 95*   Time 4   Period Weeks   Status New     OT SHORT TERM GOAL #4   Title Pt will demo at least 50* R shoulder abduction for lateral reaching/ADLs.   Baseline 35*   Time 4   Period Weeks   Status New     OT SHORT TERM GOAL #5   Title Pt will verbalize understanding of proper positioning of RUE/adaptions  for ADLs to decr RUE pain.   Time 4   Period Weeks   Status New           OT Long Term Goals - 08/01/16 1929      OT LONG TERM GOAL #1   Title Pt will be independent with updated HEP.--check LTGs 09/30/16   Time 8   Period Weeks   Status New     OT LONG TERM GOAL #2   Title Pt will report pain less than 4/10 for light ADLs/IADLs.   Baseline 10/10 with movement   Time 8   Period Weeks   Status New     OT LONG TERM GOAL #3   Title Pt will demo at least 120* R shoulder flex for light functional reaching.   Baseline 95*   Time 8   Period Weeks   Status New     OT LONG TERM GOAL #4   Title Pt will demo at least 70* R shoulder abduction for lateral reaching/ADLs.   Baseline 35*   Time 8   Period Weeks   Status New     OT LONG TERM GOAL #5   Title Pt will improve R grip strength by at least 15lbs for gripping/lifting tasks.   Baseline R-35lbs   Time 8   Period Weeks   Status New     Long Term Additional Goals   Additional Long Term Goals Yes     OT LONG TERM GOAL #6   Title Pt will improve R hand coordination for ADLs as shown by completing 9-hole peg test in 25sec or less.   Baseline 37.25sec   Time 8   Period Weeks   Status New               Plan - 08/11/16 1146    Clinical Impression Statement Pt is progressing slowly towards goals. Pt is limited by right shoulder pain.    Rehab Potential Good   OT Frequency 2x / week   OT Duration 8 weeks   OT Treatment/Interventions Self-care/ADL training;Cryotherapy;Parrafin;Therapeutic exercise;DME and/or AE instruction;Functional Mobility Training;Manual Therapy;Neuromuscular education;Splinting;Fluidtherapy;Ultrasound;Electrical Stimulation;Moist Heat;Contrast Bath;Energy conservation;Passive range of motion;Therapeutic exercises;Therapeutic activities;Patient/family education   Plan ultrasound to right shoulder, continue to reinforce proper shoulder positioning.   Consulted and Agree with Plan of Care Patient       Patient will benefit from skilled therapeutic intervention in order to improve the following deficits and impairments:  Decreased activity tolerance, Decreased coordination, Decreased knowledge of use of DME, Decreased strength, Impaired UE functional use, Pain, Improper body mechanics, Decreased range of motion, Decreased mobility, Impaired  vision/preception, Decreased balance  Visit Diagnosis: Hemiplegia and hemiparesis following cerebral infarction affecting right dominant side (HCC)  Right shoulder pain, unspecified chronicity  Other lack of coordination  Stiffness of right shoulder, not elsewhere classified    Problem List Patient Active Problem List   Diagnosis Date Noted  . Dupuytren's contracture of right hand 02/18/2016  . Gait disturbance, post-stroke 01/04/2016  . Hemiparesis affecting right side as late effect of cerebrovascular accident (CVA) ([place]) 01/04/2016  . Cerebral infarction due to thrombosis of left middle cerebral artery ([place]) 12/15/2015  . Left middle cerebral artery stroke ([place]) 12/15/2015  . Oropharyngeal dysphagia   . Aphasia as late effect of stroke   . Right hemiplegia ([place])   . Acute on chronic respiratory failure ([place])   . Essential hypertension   . CVA (cerebral vascular accident) ([place]) 12/08/2015    James Cortez 08/11/2016, 11:47 AM Theone Murdoch, OTR/L [phone] Phone: [phone] 11:52 AM 08/11/16 [facility] [address] [place], [place], [address] Phone: [phone]   Fax:  [phone]  Name: James Cortez MRN: [id number] Date of Birth: Nov 07, 1965

## 2016-08-12 ENCOUNTER — Ambulatory Visit (INDEPENDENT_AMBULATORY_CARE_PROVIDER_SITE_OTHER): Payer: No Typology Code available for payment source | Admitting: *Deleted

## 2016-08-12 DIAGNOSIS — I639 Cerebral infarction, unspecified: Secondary | ICD-10-CM

## 2016-08-12 NOTE — Progress Notes (Signed)
Carelink Summary Report / Loop Recorder 

## 2016-08-15 ENCOUNTER — Ambulatory Visit: Payer: No Typology Code available for payment source | Admitting: Occupational Therapy

## 2016-08-15 DIAGNOSIS — M25511 Pain in right shoulder: Secondary | ICD-10-CM

## 2016-08-15 DIAGNOSIS — I69351 Hemiplegia and hemiparesis following cerebral infarction affecting right dominant side: Secondary | ICD-10-CM

## 2016-08-15 DIAGNOSIS — M25611 Stiffness of right shoulder, not elsewhere classified: Secondary | ICD-10-CM

## 2016-08-15 DIAGNOSIS — R278 Other lack of coordination: Secondary | ICD-10-CM

## 2016-08-15 NOTE — Therapy (Signed)
[place] [address] [place], [place], [address] Phone: [phone]   Fax:  [phone]  Occupational Therapy Treatment  Patient Details  Name: James Cortez MRN: [id number] Date of Birth: [birth year] Referring Provider: Dr. Arnoldo Morale  Encounter Date: 08/15/2016      OT End of Session - 08/15/16 1018    Visit Number 5   Number of Visits 17   Date for OT Re-Evaluation 09/30/16   Authorization Type GCCN 100% coverage   Authorization Time Period covered through 10/12/16    OT Start Time 1018   OT Stop Time 1100   OT Time Calculation (min) 42 min   Activity Tolerance Patient tolerated treatment well   Behavior During Therapy [facility] for tasks assessed/performed      Past Medical History:  Diagnosis Date  . Skin cancer     Past Surgical History:  Procedure Laterality Date  . EP IMPLANTABLE DEVICE N/A 12/15/2015   Procedure: Loop Recorder Insertion;  Surgeon: Will Meredith Leeds, MD;  Location: [place] [facility];  Service: Cardiovascular;  Laterality: N/A;  . IR GENERIC HISTORICAL  12/08/2015   IR PERCUTANEOUS ART THROMBECTOMY/INFUSION INTRACRANIAL INC DIAG ANGIO 12/08/2015 Luanne Bras, MD [facility]-INTERV RAD  . RADIOLOGY WITH ANESTHESIA Right 12/08/2015   Procedure: RADIOLOGY WITH ANESTHESIA - CODE STROKE;  Surgeon: Medication Radiologist, MD;  Location: [place];  Service: Radiology;  Laterality: Right;  . SKIN SURGERY    . TEE WITHOUT CARDIOVERSION N/A 12/15/2015   Procedure: TRANSESOPHAGEAL ECHOCARDIOGRAM (TEE);  Surgeon: Sueanne Margarita, MD;  Location: [facility]OS[facility];  Service: Cardiovascular;  Laterality: N/A;    There were no vitals filed for this visit.      Subjective Assessment - 08/15/16 1018    Subjective  Pt reports that he can lift his arm more   Patient is accompained by: Interpreter   Pertinent History L MCA distribution infarct, aphasia, R hemiparesis, R hand dupuytren's contracture,  HTN   Limitations native Spanish speaking, uses interpreter   Patient Stated Goals improve RUE pain and functional use, return to work   Currently in Pain? Yes   Pain Score 4    Pain Location Shoulder   Pain Orientation Right   Pain Descriptors / Indicators Aching   Pain Type Chronic pain   Pain Onset More than a month ago   Pain Frequency Intermittent   Aggravating Factors  movement   Pain Relieving Factors proper positioning, stretching        OT Treatments/Exercises (OP) - 08/09/16 0001      Modalities   Modalities Ultrasound;Cryotherapy     Cryotherapy   Number Minutes Cryotherapy 5 Minutes   Cryotherapy Location Shoulder  anterior   Type of Cryotherapy Ice massage     Ultrasound   Ultrasound Location R anterior shoulder   Ultrasound Parameters 44mhz, 1.0wts/cm2, 50% pulsed x8 min with no adverse reactions   Ultrasound Goals Pain      Manual:  In supine, gentle R shoulder mobs/myofacial release within pt tolerance.  In supine, scapular depression and scapular retraction with min cueing initially.  In supine, shoulder flex and chest press (HEP review) with BUEs with ball  And small range horizontal abduction at mid-range wiithin pain-free range with min cueing/facilitation for proper positioning.    In sitting, AAROM shoulder horizontal abduction within pain free range with ER as able and min cues/facilitation at scapula to avoid compensation.  In sitting, scapular retraction with min cueing/facilitation.  Reviewed importance of  proper positioning, moving within pain-free range, use of ice for pain.  Pt verbalized understanding (via interpreter).  Pt reported no pain at end of session.          OT Short Term Goals - 08/01/16 1923      OT SHORT TERM GOAL #1   Title Pt will be independent with initial HEP.--check STGs 08/30/16   Time 4   Period Weeks   Status New     OT SHORT TERM GOAL #2   Title Pt will report pain less than 6/10 for light BADLs.    Baseline 10/10 with movement   Time 4   Period Weeks   Status New     OT SHORT TERM GOAL #3   Title Pt will demo at least 105* R shoulder flex for light functional reaching.   Baseline 95*   Time 4   Period Weeks   Status New     OT SHORT TERM GOAL #4   Title Pt will demo at least 50* R shoulder abduction for lateral reaching/ADLs.   Baseline 35*   Time 4   Period Weeks   Status New     OT SHORT TERM GOAL #5   Title Pt will verbalize understanding of proper positioning of RUE/adaptions for ADLs to decr RUE pain.   Time 4   Period Weeks   Status New           OT Long Term Goals - 08/01/16 1929      OT LONG TERM GOAL #1   Title Pt will be independent with updated HEP.--check LTGs 09/30/16   Time 8   Period Weeks   Status New     OT LONG TERM GOAL #2   Title Pt will report pain less than 4/10 for light ADLs/IADLs.   Baseline 10/10 with movement   Time 8   Period Weeks   Status New     OT LONG TERM GOAL #3   Title Pt will demo at least 120* R shoulder flex for light functional reaching.   Baseline 95*   Time 8   Period Weeks   Status New     OT LONG TERM GOAL #4   Title Pt will demo at least 70* R shoulder abduction for lateral reaching/ADLs.   Baseline 35*   Time 8   Period Weeks   Status New     OT LONG TERM GOAL #5   Title Pt will improve R grip strength by at least 15lbs for gripping/lifting tasks.   Baseline R-35lbs   Time 8   Period Weeks   Status New     Long Term Additional Goals   Additional Long Term Goals Yes     OT LONG TERM GOAL #6   Title Pt will improve R hand coordination for ADLs as shown by completing 9-hole peg test in 25sec or less.   Baseline 37.25sec   Time 8   Period Weeks   Status New               Plan - 08/15/16 1136    Clinical Impression Statement Pt is progressing towards goals with improved movement with decr pain overall.   Rehab Potential Good   OT Frequency 2x / week   OT Duration 8 weeks   OT  Treatment/Interventions Self-care/ADL training;Cryotherapy;Parrafin;Therapeutic exercise;DME and/or AE instruction;Functional Mobility Training;Manual Therapy;Neuromuscular education;Splinting;Fluidtherapy;Ultrasound;Electrical Stimulation;Moist Heat;Contrast Bath;Energy conservation;Passive range of motion;Therapeutic exercises;Therapeutic activities;Patient/family education   Plan ultrasound to R shoulder, continue to  reinforce shoulder positioning, progress AAROM/AROM as able   OT Home Exercise Plan Education provided:  HEP, proper positiong of RUE    Consulted and Agree with Plan of Care Patient      Patient will benefit from skilled therapeutic intervention in order to improve the following deficits and impairments:  Decreased activity tolerance, Decreased coordination, Decreased knowledge of use of DME, Decreased strength, Impaired UE functional use, Pain, Improper body mechanics, Decreased range of motion, Decreased mobility, Impaired vision/preception, Decreased balance  Visit Diagnosis: Hemiplegia and hemiparesis following cerebral infarction affecting right dominant side (HCC)  Right shoulder pain, unspecified chronicity  Other lack of coordination  Stiffness of right shoulder, not elsewhere classified    Problem List Patient Active Problem List   Diagnosis Date Noted  . Dupuytren's contracture of right hand 02/18/2016  . Gait disturbance, post-stroke 01/04/2016  . Hemiparesis affecting right side as late effect of cerebrovascular accident (CVA) ([place]) 01/04/2016  . Cerebral infarction due to thrombosis of left middle cerebral artery ([place]) 12/15/2015  . Left middle cerebral artery stroke ([place]) 12/15/2015  . Oropharyngeal dysphagia   . Aphasia as late effect of stroke   . Right hemiplegia ([place])   . Acute on chronic respiratory failure ([place])   . Essential hypertension   . CVA (cerebral vascular accident) [facility]) 12/08/2015    [facility] 08/15/2016, 12:06 PM  [place] [address] [place], [place], [address] Phone: [phone]   Fax:  [phone]  Name: Kaesyn Johnston MRN: [id number] Date of Birth: [birth year]   Vianne Bulls, OTR/L [facility] [address] [place], [place]  [address] [phone] phone [phone] 08/15/16 12:06 PM

## 2016-08-18 ENCOUNTER — Encounter: Payer: No Typology Code available for payment source | Admitting: Speech Pathology

## 2016-08-18 LAB — CUP PACEART REMOTE DEVICE CHECK
Date Time Interrogation Session: 20180705220950
Implantable Pulse Generator Implant Date: 20171107

## 2016-08-19 ENCOUNTER — Ambulatory Visit: Payer: No Typology Code available for payment source | Admitting: Occupational Therapy

## 2016-08-19 DIAGNOSIS — I69351 Hemiplegia and hemiparesis following cerebral infarction affecting right dominant side: Secondary | ICD-10-CM

## 2016-08-19 DIAGNOSIS — M25511 Pain in right shoulder: Secondary | ICD-10-CM

## 2016-08-19 DIAGNOSIS — M25611 Stiffness of right shoulder, not elsewhere classified: Secondary | ICD-10-CM

## 2016-08-19 DIAGNOSIS — R278 Other lack of coordination: Secondary | ICD-10-CM

## 2016-08-19 NOTE — Therapy (Signed)
[place] [address] [place], [place], [address] Phone: [phone]   Fax:  [phone]  Occupational Therapy Treatment  Patient Details  Name: James Cortez MRN: [id number] Date of Birth: [birth year] Referring Provider: Dr. Arnoldo Morale  Encounter Date: 08/19/2016      OT End of Session - 08/19/16 1658    Visit Number 6   Number of Visits 17   Date for OT Re-Evaluation 09/30/16   Authorization Type GCCN 100% coverage   Authorization Time Period covered through 10/12/16    OT Start Time 1453   OT Stop Time 1532   OT Time Calculation (min) 39 min   Activity Tolerance Patient tolerated treatment well   Behavior During Therapy [facility] for tasks assessed/performed      Past Medical History:  Diagnosis Date  . Skin cancer     Past Surgical History:  Procedure Laterality Date  . EP IMPLANTABLE DEVICE N/A 12/15/2015   Procedure: Loop Recorder Insertion;  Surgeon: Will Meredith Leeds, MD;  Location: [place] [facility];  Service: Cardiovascular;  Laterality: N/A;  . IR GENERIC HISTORICAL  12/08/2015   IR PERCUTANEOUS ART THROMBECTOMY/INFUSION INTRACRANIAL INC DIAG ANGIO 12/08/2015 Luanne Bras, MD [facility]-INTERV RAD  . RADIOLOGY WITH ANESTHESIA Right 12/08/2015   Procedure: RADIOLOGY WITH ANESTHESIA - CODE STROKE;  Surgeon: Medication Radiologist, MD;  Location: [place];  Service: Radiology;  Laterality: Right;  . SKIN SURGERY    . TEE WITHOUT CARDIOVERSION N/A 12/15/2015   Procedure: TRANSESOPHAGEAL ECHOCARDIOGRAM (TEE);  Surgeon: Sueanne Margarita, MD;  Location: [facility] ENDOSCOPY;  Service: Cardiovascular;  Laterality: N/A;    There were no vitals filed for this visit.      Subjective Assessment - 08/19/16 1657    Subjective  Pt reports incr pain with exercises   Patient is accompained by: Interpreter   Pertinent History L MCA distribution infarct, aphasia, R hemiparesis, R hand dupuytren's contracture, HTN    Limitations native Spanish speaking, uses interpreter   Patient Stated Goals improve RUE pain and functional use, return to work   Currently in Pain? Yes   Pain Score 4    Pain Location --  shoulder   Pain Orientation Right   Pain Descriptors / Indicators Aching   Pain Type Chronic pain   Pain Onset More than a month ago   Pain Frequency Intermittent   Aggravating Factors  movement, abduction, overhead reach, improper positioning    Pain Relieving Factors proper positioning, rest          Ultrasound   Ultrasound Location R anterior shoulder   Ultrasound Parameters 32mhz, 1.0wts/cm2, 50% pulsed x8 min with no adverse reactions   Ultrasound Goals Pain      Manual:  In supine, gentle R shoulder mobs/myofacial release within pt tolerance.  In supine, scapular depression and scapular retraction with min cueing initially.  In supine, shoulder flex and chest press (HEP review) with BUEs with ball   wiithin pain-free range with min cueing/facilitation for proper positioning.    In sitting, AAROM ER in pain-free range with min cueing.  In sitting, scapular retraction with min cueing/facilitation.                      OT Education - 08/19/16 1713    Education Details Pt instructed to only perform HEP (ball ex in supine and table slides) as improper positioning and trying to do other therapy exercises at home without cueing may contribute to  incr pain;  reviewed not to overdo it and use pain as a guide for all movement   Person(s) Educated Patient  via interpreter   Methods Explanation   Comprehension Verbalized understanding          OT Short Term Goals - 08/01/16 1923      OT SHORT TERM GOAL #1   Title Pt will be independent with initial HEP.--check STGs 08/30/16   Time 4   Period Weeks   Status New     OT SHORT TERM GOAL #2   Title Pt will report pain less than 6/10 for light BADLs.   Baseline 10/10 with movement   Time 4   Period Weeks   Status  New     OT SHORT TERM GOAL #3   Title Pt will demo at least 105* R shoulder flex for light functional reaching.   Baseline 95*   Time 4   Period Weeks   Status New     OT SHORT TERM GOAL #4   Title Pt will demo at least 50* R shoulder abduction for lateral reaching/ADLs.   Baseline 35*   Time 4   Period Weeks   Status New     OT SHORT TERM GOAL #5   Title Pt will verbalize understanding of proper positioning of RUE/adaptions for ADLs to decr RUE pain.   Time 4   Period Weeks   Status New           OT Long Term Goals - 08/01/16 1929      OT LONG TERM GOAL #1   Title Pt will be independent with updated HEP.--check LTGs 09/30/16   Time 8   Period Weeks   Status New     OT LONG TERM GOAL #2   Title Pt will report pain less than 4/10 for light ADLs/IADLs.   Baseline 10/10 with movement   Time 8   Period Weeks   Status New     OT LONG TERM GOAL #3   Title Pt will demo at least 120* R shoulder flex for light functional reaching.   Baseline 95*   Time 8   Period Weeks   Status New     OT LONG TERM GOAL #4   Title Pt will demo at least 70* R shoulder abduction for lateral reaching/ADLs.   Baseline 35*   Time 8   Period Weeks   Status New     OT LONG TERM GOAL #5   Title Pt will improve R grip strength by at least 15lbs for gripping/lifting tasks.   Baseline R-35lbs   Time 8   Period Weeks   Status New     Long Term Additional Goals   Additional Long Term Goals Yes     OT LONG TERM GOAL #6   Title Pt will improve R hand coordination for ADLs as shown by completing 9-hole peg test in 25sec or less.   Baseline 37.25sec   Time 8   Period Weeks   Status New               Plan - 08/19/16 1714    Clinical Impression Statement Pt is progressing towards goals with improved positioning and overall pain.  However, pt reports incr pain at night with trying to do additional exercise.  Emphasized that pt should only perform HEP with good positioning.  Pt  with no pain and improved movement at end of session.   Rehab Potential Good  OT Frequency 2x / week   OT Duration 8 weeks   OT Treatment/Interventions Self-care/ADL training;Cryotherapy;Parrafin;Therapeutic exercise;DME and/or AE instruction;Functional Mobility Training;Manual Therapy;Neuromuscular education;Splinting;Fluidtherapy;Ultrasound;Electrical Stimulation;Moist Heat;Contrast Bath;Energy conservation;Passive range of motion;Therapeutic exercises;Therapeutic activities;Patient/family education   Plan ultrasound to R shoulder, continue to reinforce proper positioning with rest/movement, progress AAROM/AROM as able   OT Home Exercise Plan Education provided:  HEP, proper positiong of RUE    Consulted and Agree with Plan of Care Patient      Patient will benefit from skilled therapeutic intervention in order to improve the following deficits and impairments:  Decreased activity tolerance, Decreased coordination, Decreased knowledge of use of DME, Decreased strength, Impaired UE functional use, Pain, Improper body mechanics, Decreased range of motion, Decreased mobility, Impaired vision/preception, Decreased balance  Visit Diagnosis: Hemiplegia and hemiparesis following cerebral infarction affecting right dominant side (HCC)  Right shoulder pain, unspecified chronicity  Other lack of coordination  Stiffness of right shoulder, not elsewhere classified    Problem List Patient Active Problem List   Diagnosis Date Noted  . Dupuytren's contracture of right hand 02/18/2016  . Gait disturbance, post-stroke 01/04/2016  . Hemiparesis affecting right side as late effect of cerebrovascular accident (CVA) ([place]) 01/04/2016  . Cerebral infarction due to thrombosis of left middle cerebral artery ([place]) 12/15/2015  . Left middle cerebral artery stroke ([place]) 12/15/2015  . Oropharyngeal dysphagia   . Aphasia as late effect of stroke   . Right hemiplegia ([place])   . Acute on chronic respiratory  failure ([place])   . Essential hypertension   . CVA (cerebral vascular accident) [facility]) 12/08/2015    [facility] 08/19/2016, 5:16 PM  [place] [address] [place], [place], [address] Phone: [phone]   Fax:  [phone]  Name: James Cortez MRN: [id number] Date of Birth: [birth year]   Vianne Bulls, OTR/L [facility] [address] [place], [place]  [address] [phone] phone [phone] 08/19/16 5:18 PM

## 2016-08-23 ENCOUNTER — Ambulatory Visit: Payer: No Typology Code available for payment source | Admitting: Occupational Therapy

## 2016-08-23 DIAGNOSIS — I69351 Hemiplegia and hemiparesis following cerebral infarction affecting right dominant side: Secondary | ICD-10-CM

## 2016-08-23 DIAGNOSIS — M25611 Stiffness of right shoulder, not elsewhere classified: Secondary | ICD-10-CM

## 2016-08-23 DIAGNOSIS — M25511 Pain in right shoulder: Secondary | ICD-10-CM

## 2016-08-23 DIAGNOSIS — R278 Other lack of coordination: Secondary | ICD-10-CM

## 2016-08-23 NOTE — Therapy (Signed)
[place] [address] [place], [place], [address] Phone: [phone]   Fax:  [phone]  Occupational Therapy Treatment  Patient Details  Name: James Cortez MRN: [id number] Date of Birth: 1965/07/23 Referring Provider: Dr. Arnoldo Morale  Encounter Date: 08/23/2016      OT End of Session - 08/23/16 0826    Visit Number 7   Number of Visits 17   Date for OT Re-Evaluation 09/30/16   Authorization Type GCCN 100% coverage   Authorization Time Period covered through 10/12/16    OT Start Time 0806   OT Stop Time 0847   OT Time Calculation (min) 41 min   Activity Tolerance Patient tolerated treatment well   Behavior During Therapy [facility] for tasks assessed/performed      Past Medical History:  Diagnosis Date  . Skin cancer     Past Surgical History:  Procedure Laterality Date  . EP IMPLANTABLE DEVICE N/A 12/15/2015   Procedure: Loop Recorder Insertion;  Surgeon: Will Meredith Leeds, MD;  Location: [place] [facility];  Service: Cardiovascular;  Laterality: N/A;  . IR GENERIC HISTORICAL  12/08/2015   IR PERCUTANEOUS ART THROMBECTOMY/INFUSION INTRACRANIAL INC DIAG ANGIO 12/08/2015 Luanne Bras, MD [facility]-INTERV RAD  . RADIOLOGY WITH ANESTHESIA Right 12/08/2015   Procedure: RADIOLOGY WITH ANESTHESIA - CODE STROKE;  Surgeon: Medication Radiologist, MD;  Location: [place];  Service: Radiology;  Laterality: Right;  . SKIN SURGERY    . TEE WITHOUT CARDIOVERSION N/A 12/15/2015   Procedure: TRANSESOPHAGEAL ECHOCARDIOGRAM (TEE);  Surgeon: Sueanne Margarita, MD;  Location: [facility] ENDOSCOPY;  Service: Cardiovascular;  Laterality: N/A;    There were no vitals filed for this visit.      Subjective Assessment - 08/23/16 0825    Subjective  Pt reports improved pain at night  "I think it is getting better"   Patient is accompained by: Interpreter   Pertinent History L MCA distribution infarct, aphasia, R hemiparesis, R hand  dupuytren's contracture, HTN   Limitations native Spanish speaking, uses interpreter   Patient Stated Goals improve RUE pain and functional use, return to work   Currently in Pain? Yes   Pain Score --  2-3/10   Pain Location --  shoulder   Pain Orientation Right   Pain Descriptors / Indicators Aching   Pain Type Chronic pain   Pain Onset More than a month ago   Pain Frequency Intermittent   Aggravating Factors  movement, abduction, overhead reach, improper positioning   Pain Relieving Factors proper positioning, rest         Ultrasound   Ultrasound Location R anterior shoulder   Ultrasound Parameters 32mhz, 1.0wts/cm2, 50% pulsed x8 min with no adverse reactions   Ultrasound Goals Pain        In supine, scapular depression and scapular retraction with min cueing initially.  In supine, shoulder flex and chest press with BUEs with ball  wiithin pain-free range with min cueing/facilitation for proper positioning.    In sitting, AAROM ER in pain-free range with min cueing.  In sitting, scapular retraction with min cueing/facilitation.  In standing, light wt. Bearing in forward flex on table through [place] with min cueing/facilitation for proper positioning.  Modified quadruped (standing leaning over mat), performed R shoulder ext/scapular retraction with min cueing/facilitation for proper positioning.  R shoulder flex 70* abduction 35* with good positioning and no pain.  OT Short Term Goals - 08/23/16 0827      OT SHORT TERM GOAL #1   Title Pt will be independent with initial HEP.--check STGs 08/30/16   Time 4   Period Weeks   Status Achieved     OT SHORT TERM GOAL #2   Title Pt will report pain less than 6/10 for light BADLs.   Baseline 10/10 with movement   Time 4   Period Weeks   Status Achieved  08/23/16     OT SHORT TERM GOAL #3   Title Pt will demo at least 105* R shoulder flex for light functional reaching.    Baseline 95*   Time 4   Period Weeks   Status On-going  08/23/16  70* without pain and good positioning     OT SHORT TERM GOAL #4   Title Pt will demo at least 50* R shoulder abduction for lateral reaching/ADLs.   Baseline 35*   Time 4   Period Weeks   Status On-going  08/23/16  35* without pain and good position     OT SHORT TERM GOAL #5   Title Pt will verbalize understanding of proper positioning of RUE/adaptions for ADLs to decr RUE pain.   Time 4   Period Weeks   Status Achieved  08/23/16, but continues to need min cueing at times           OT Long Term Goals - 08/01/16 1929      OT LONG TERM GOAL #1   Title Pt will be independent with updated HEP.--check LTGs 09/30/16   Time 8   Period Weeks   Status New     OT LONG TERM GOAL #2   Title Pt will report pain less than 4/10 for light ADLs/IADLs.   Baseline 10/10 with movement   Time 8   Period Weeks   Status New     OT LONG TERM GOAL #3   Title Pt will demo at least 120* R shoulder flex for light functional reaching.   Baseline 95*   Time 8   Period Weeks   Status New     OT LONG TERM GOAL #4   Title Pt will demo at least 70* R shoulder abduction for lateral reaching/ADLs.   Baseline 35*   Time 8   Period Weeks   Status New     OT LONG TERM GOAL #5   Title Pt will improve R grip strength by at least 15lbs for gripping/lifting tasks.   Baseline R-35lbs   Time 8   Period Weeks   Status New     Long Term Additional Goals   Additional Long Term Goals Yes     OT LONG TERM GOAL #6   Title Pt will improve R hand coordination for ADLs as shown by completing 9-hole peg test in 25sec or less.   Baseline 37.25sec   Time 8   Period Weeks   Status New               Plan - 08/23/16 0962    Clinical Impression Statement Pt is progressing towards goals with decr pain and improved movement without pain.   Rehab Potential Good   OT Frequency 2x / week   OT Duration 8 weeks   OT  Treatment/Interventions Self-care/ADL training;Cryotherapy;Parrafin;Therapeutic exercise;DME and/or AE instruction;Functional Mobility Training;Manual Therapy;Neuromuscular education;Splinting;Fluidtherapy;Ultrasound;Electrical Stimulation;Moist Heat;Contrast Bath;Energy conservation;Passive range of motion;Therapeutic exercises;Therapeutic activities;Patient/family education   Plan continue with ultrasound to R shoulder, progress with AAROM/AROM and  scapular strengthening as able   OT Home Exercise Plan Education provided:  HEP, proper positiong of RUE    Consulted and Agree with Plan of Care Patient      Patient will benefit from skilled therapeutic intervention in order to improve the following deficits and impairments:  Decreased activity tolerance, Decreased coordination, Decreased knowledge of use of DME, Decreased strength, Impaired UE functional use, Pain, Improper body mechanics, Decreased range of motion, Decreased mobility, Impaired vision/preception, Decreased balance  Visit Diagnosis: Hemiplegia and hemiparesis following cerebral infarction affecting right dominant side (HCC)  Right shoulder pain, unspecified chronicity  Other lack of coordination  Stiffness of right shoulder, not elsewhere classified    Problem List Patient Active Problem List   Diagnosis Date Noted  . Dupuytren's contracture of right hand 02/18/2016  . Gait disturbance, post-stroke 01/04/2016  . Hemiparesis affecting right side as late effect of cerebrovascular accident (CVA) ([place]) 01/04/2016  . Cerebral infarction due to thrombosis of left middle cerebral artery ([place]) 12/15/2015  . Left middle cerebral artery stroke ([place]) 12/15/2015  . Oropharyngeal dysphagia   . Aphasia as late effect of stroke   . Right hemiplegia ([place])   . Acute on chronic respiratory failure ([place])   . Essential hypertension   . CVA (cerebral vascular accident) ([place]) 12/08/2015    [facility] 08/23/2016, 9:04 AM  [place] [address] [place], [place], [address] Phone: [phone]   Fax:  [phone]  Name: James Cortez MRN: [id number] Date of Birth: 12/12/1965   Vianne Bulls, OTR/L [facility] [address] [place], [place]  [address] [phone] phone [phone] 08/23/16 9:04 AM

## 2016-08-25 ENCOUNTER — Encounter: Payer: No Typology Code available for payment source | Admitting: Speech Pathology

## 2016-08-25 ENCOUNTER — Ambulatory Visit: Payer: No Typology Code available for payment source | Admitting: Occupational Therapy

## 2016-08-25 DIAGNOSIS — I69351 Hemiplegia and hemiparesis following cerebral infarction affecting right dominant side: Secondary | ICD-10-CM

## 2016-08-25 DIAGNOSIS — M25611 Stiffness of right shoulder, not elsewhere classified: Secondary | ICD-10-CM

## 2016-08-25 DIAGNOSIS — M25511 Pain in right shoulder: Secondary | ICD-10-CM

## 2016-08-25 DIAGNOSIS — R278 Other lack of coordination: Secondary | ICD-10-CM

## 2016-08-25 NOTE — Therapy (Signed)
[place] [address] [place], [place], [address] Phone: [phone]   Fax:  [phone]  Occupational Therapy Treatment  Patient Details  Name: James Cortez MRN: [id number] Date of Birth: 08-Oct-1965 Referring Provider: Dr. Arnoldo Morale  Encounter Date: 08/25/2016      OT End of Session - 08/25/16 1035    Visit Number 8   Number of Visits 17   Date for OT Re-Evaluation 09/30/16   Authorization Type GCCN 100% coverage   Authorization Time Period covered through 10/12/16    OT Start Time 1020   OT Stop Time 1100   OT Time Calculation (min) 40 min   Activity Tolerance Patient tolerated treatment well   Behavior During Therapy [facility] for tasks assessed/performed      Past Medical History:  Diagnosis Date  . Skin cancer     Past Surgical History:  Procedure Laterality Date  . EP IMPLANTABLE DEVICE N/A 12/15/2015   Procedure: Loop Recorder Insertion;  Surgeon: Will Meredith Leeds, MD;  Location: [place] [facility];  Service: Cardiovascular;  Laterality: N/A;  . IR GENERIC HISTORICAL  12/08/2015   IR PERCUTANEOUS ART THROMBECTOMY/INFUSION INTRACRANIAL INC DIAG ANGIO 12/08/2015 Luanne Bras, MD [facility]-INTERV RAD  . RADIOLOGY WITH ANESTHESIA Right 12/08/2015   Procedure: RADIOLOGY WITH ANESTHESIA - CODE STROKE;  Surgeon: Medication Radiologist, MD;  Location: [place];  Service: Radiology;  Laterality: Right;  . SKIN SURGERY    . TEE WITHOUT CARDIOVERSION N/A 12/15/2015   Procedure: TRANSESOPHAGEAL ECHOCARDIOGRAM (TEE);  Surgeon: Sueanne Margarita, MD;  Location: [facility] ENDOSCOPY;  Service: Cardiovascular;  Laterality: N/A;    There were no vitals filed for this visit.      Subjective Assessment - 08/25/16 1034    Subjective  Pt reports that he is able to turn his palm up and reach R shoulder now   Patient is accompained by: Interpreter   Pertinent History L MCA distribution infarct, aphasia, R hemiparesis, R  hand dupuytren's contracture, HTN   Limitations native Spanish speaking, uses interpreter   Patient Stated Goals improve RUE pain and functional use, return to work   Currently in Pain? No/denies   Pain Onset More than a month ago          Ultrasound   Ultrasound Location R anterior shoulder   Ultrasound Parameters 26mhz, 1.0wts/cm2, 50% pulsed x8 min with no adverse reactions   Ultrasound Goals Pain        In supine, scapular depression and scapular retraction with min cueing initially.  In supine, shoulder flex and chest press with BUEs with ball  wiithin pain-free range with min cueing/facilitation for proper positioning.    In sitting, AAROM ER in pain-free range with min cueing.  In sitting, scapular retraction with min cueing/facilitation.  In standing, light wt. Bearing in forward flex on table through [place] with min cueing/facilitation for proper positioning.  Modified quadruped (standing leaning over mat), performed R shoulder ext/scapular retraction with min cueing/facilitation for proper positioning.  In sitting, AAROM shoulder flex with BUEs with ball to approx 90* with min facilitation for compensation.  In standing, AAROM shoulder abduction with min cueing for positioning.                         OT Short Term Goals - 08/23/16 0827      OT SHORT TERM GOAL #1   Title Pt will be independent with initial HEP.--check STGs 08/30/16  Time 4   Period Weeks   Status Achieved     OT SHORT TERM GOAL #2   Title Pt will report pain less than 6/10 for light BADLs.   Baseline 10/10 with movement   Time 4   Period Weeks   Status Achieved  08/23/16     OT SHORT TERM GOAL #3   Title Pt will demo at least 105* R shoulder flex for light functional reaching.   Baseline 95*   Time 4   Period Weeks   Status On-going  08/23/16  70* without pain and good positioning     OT SHORT TERM GOAL #4   Title Pt will demo at least 50* R shoulder abduction for  lateral reaching/ADLs.   Baseline 35*   Time 4   Period Weeks   Status On-going  08/23/16  35* without pain and good position     OT SHORT TERM GOAL #5   Title Pt will verbalize understanding of proper positioning of RUE/adaptions for ADLs to decr RUE pain.   Time 4   Period Weeks   Status Achieved  08/23/16, but continues to need min cueing at times           OT Long Term Goals - 08/01/16 1929      OT LONG TERM GOAL #1   Title Pt will be independent with updated HEP.--check LTGs 09/30/16   Time 8   Period Weeks   Status New     OT LONG TERM GOAL #2   Title Pt will report pain less than 4/10 for light ADLs/IADLs.   Baseline 10/10 with movement   Time 8   Period Weeks   Status New     OT LONG TERM GOAL #3   Title Pt will demo at least 120* R shoulder flex for light functional reaching.   Baseline 95*   Time 8   Period Weeks   Status New     OT LONG TERM GOAL #4   Title Pt will demo at least 70* R shoulder abduction for lateral reaching/ADLs.   Baseline 35*   Time 8   Period Weeks   Status New     OT LONG TERM GOAL #5   Title Pt will improve R grip strength by at least 15lbs for gripping/lifting tasks.   Baseline R-35lbs   Time 8   Period Weeks   Status New     Long Term Additional Goals   Additional Long Term Goals Yes     OT LONG TERM GOAL #6   Title Pt will improve R hand coordination for ADLs as shown by completing 9-hole peg test in 25sec or less.   Baseline 37.25sec   Time 8   Period Weeks   Status New               Plan - 08/25/16 1035    Clinical Impression Statement Pt continues to make good progress towards goals with decr pain improved movement and demo less guarding in positioning and demo RUE arm swing with ambulation now.   Rehab Potential Good   OT Frequency 2x / week   OT Duration 8 weeks   OT Treatment/Interventions Self-care/ADL training;Cryotherapy;Parrafin;Therapeutic exercise;DME and/or AE instruction;Functional Mobility  Training;Manual Therapy;Neuromuscular education;Splinting;Fluidtherapy;Ultrasound;Electrical Stimulation;Moist Heat;Contrast Bath;Energy conservation;Passive range of motion;Therapeutic exercises;Therapeutic activities;Patient/family education   Plan continue with ultrasound to R shoulder, progress with AAROM/AROM and scapular strengthening as able   OT Home Exercise Plan Education provided:  HEP, proper positiong of  RUE    Consulted and Agree with Plan of Care Patient      Patient will benefit from skilled therapeutic intervention in order to improve the following deficits and impairments:  Decreased activity tolerance, Decreased coordination, Decreased knowledge of use of DME, Decreased strength, Impaired UE functional use, Pain, Improper body mechanics, Decreased range of motion, Decreased mobility, Impaired vision/preception, Decreased balance  Visit Diagnosis: Hemiplegia and hemiparesis following cerebral infarction affecting right dominant side (HCC)  Right shoulder pain, unspecified chronicity  Other lack of coordination  Stiffness of right shoulder, not elsewhere classified    Problem List Patient Active Problem List   Diagnosis Date Noted  . Dupuytren's contracture of right hand 02/18/2016  . Gait disturbance, post-stroke 01/04/2016  . Hemiparesis affecting right side as late effect of cerebrovascular accident (CVA) ([place]) 01/04/2016  . Cerebral infarction due to thrombosis of left middle cerebral artery ([place]) 12/15/2015  . Left middle cerebral artery stroke ([place]) 12/15/2015  . Oropharyngeal dysphagia   . Aphasia as late effect of stroke   . Right hemiplegia ([place])   . Acute on chronic respiratory failure ([place])   . Essential hypertension   . CVA (cerebral vascular accident) [facility]) 12/08/2015    [facility] 08/25/2016, 10:38 AM  [place] [address] [place], [place], [address] Phone: [phone]   Fax:   [phone]  Name: Narvel Kozub MRN: [id number] Date of Birth: 07/04/1965   Vianne Bulls, OTR/L [facility] [address] [place], [place]  [address] [phone] phone [phone] 08/25/16 10:39 AM

## 2016-08-29 ENCOUNTER — Ambulatory Visit (HOSPITAL_BASED_OUTPATIENT_CLINIC_OR_DEPARTMENT_OTHER): Payer: No Typology Code available for payment source | Admitting: Physical Medicine & Rehabilitation

## 2016-08-29 ENCOUNTER — Encounter: Payer: No Typology Code available for payment source | Attending: Physical Medicine & Rehabilitation

## 2016-08-29 ENCOUNTER — Encounter: Payer: Self-pay | Admitting: Physical Medicine & Rehabilitation

## 2016-08-29 VITALS — BP 145/80 | HR 63

## 2016-08-29 DIAGNOSIS — Z87891 Personal history of nicotine dependence: Secondary | ICD-10-CM | POA: Insufficient documentation

## 2016-08-29 DIAGNOSIS — M25511 Pain in right shoulder: Secondary | ICD-10-CM | POA: Insufficient documentation

## 2016-08-29 DIAGNOSIS — Z85828 Personal history of other malignant neoplasm of skin: Secondary | ICD-10-CM | POA: Insufficient documentation

## 2016-08-29 DIAGNOSIS — M25811 Other specified joint disorders, right shoulder: Secondary | ICD-10-CM | POA: Insufficient documentation

## 2016-08-29 DIAGNOSIS — M7541 Impingement syndrome of right shoulder: Secondary | ICD-10-CM

## 2016-08-29 DIAGNOSIS — I69351 Hemiplegia and hemiparesis following cerebral infarction affecting right dominant side: Secondary | ICD-10-CM | POA: Insufficient documentation

## 2016-08-29 NOTE — Patient Instructions (Signed)
Sndrome de pinzamiento secundario del hombro con rehabilitacin Secondary Shoulder Impingement Syndrome Rehab Consulte al mdico qu ejercicios son seguros para usted. Haga los ejercicios exactamente como se lo haya indicado el mdico y gradelos como se lo hayan indicado. Es normal sentir tirantez, tensin, presin o molestias leves mientras hace estos ejercicios, pero debe detenerse de inmediato si siente dolor repentino o si el dolor empeora. No comience a hacer estos ejercicios hasta que se lo indique el mdico. EJERCICIO DE [employer] Y AMPLITUD DE MOVIMIENTOS Este ejercicio calienta los msculos y las articulaciones, y mejora la movilidad y la flexibilidad del cuello y el hombro. Adems, ayuda a Best boy y la rigidez. EjercicioA: Inclinacin hacia los lados, columna cervical 1. Sintese en una silla estable o pngase de pie y [place]. 2. Sin mover los hombros, incline lentamente el lado izquierdo/derecho de la cabeza, llevando la oreja hacia el hombro izquierdo/derecho hasta sentir un estiramiento en los msculos del cuello. Mantenga la mirada al frente. 3. Mantenga esta posicin durante __________ segundos. 4. Vuelva lentamente a la posicin inicial. 5. Repita del lado izquierdo/derecho. Repita __________ veces. Realice este ejercicio __________ veces al da. EJERCICIOS DE FORTALECIMIENTO Estos ejercicios fortalecen el hombro y le otorgan resistencia. La resistencia es la capacidad de usar los msculos durante un tiempo prolongado, incluso despus de que se cansen. EjercicioB: Protraccin escapular, decbito supino 1. Acustese boca arriba sobre una superficie firme. Sostenga una pesa de __________ con la mano izquierda/derecha. 2. Eleve el brazo izquierdo/derecho en el aire, de modo que la mano est directamente por encima de la articulacin del hombro. 3. Empuje con la pesa en el aire de forma que el hombro se levante de la superficie sobre la que est  recostado. No mueva la cabeza, el cuello ni la espalda. 4. Mantenga esta posicin durante __________ segundos. 5. Vuelva lentamente a la posicin inicial. Relaje totalmente los msculos antes de repetir el ejercicio. Repita __________ veces. Realice este ejercicio __________ veces al da. EjercicioC: Retraccin escapular 1. Sintese en una silla estable que no tenga apoyabrazos o pngase de pie. 2. Ate una banda para ejercicios a un objeto estable que est frente a usted, de modo que la banda est a la altura del hombro. 3. Sostenga un extremo de la banda para ejercicios en cada mano. Las palmas deben estar Wendi Maya. 4. Junte los omplatos y [place] los codos ligeramente hacia atrs. No encoja los hombros al hacerlo. 5. Mantenga esta posicin durante __________ segundos. 6. Vuelva lentamente a la posicin inicial. Repita __________ veces. Realice este ejercicio __________ veces al da. EjercicioD: Extensin del hombro con Chief Technology Officer 1. Sintese en una silla estable que no tenga apoyabrazos o pngase de pie. 2. Ate una banda para ejercicios a un objeto estable que est frente a usted, de modo que la banda est por encima de la altura del hombro. 3. Sostenga un extremo de la banda para ejercicios en cada mano. 4. Extienda los codos y [employer] manos a la altura de los hombros. 5. Junte los omplatos y baje las manos hacia los costados de los muslos. Detngase cuando las manos estn en la misma posicin en ambos costados. No deje que las manos vayan hacia atrs del cuerpo. 6. Mantenga esta posicin durante __________ segundos. 7. Vuelva lentamente a la posicin inicial. Repita __________ veces. Realice este ejercicio __________ veces al da. EjercicioE: Abduccin del hombro 1. Sintese en una silla estable que no tenga apoyabrazos o pngase de pie. 2. Si se  lo indican, sostenga una pesa de __________ con la mano izquierda/derecha. 3. Comience con los brazos hacia abajo. Gire la  mano izquierda/derecha de modo que la palma quede [place], frente a su cuerpo. 4. Lentamente, levante la mano izquierda/derecha hacia el costado. No la levante por encima de la altura del hombro.  Mantenga los brazos extendidos.  No encoja los hombros al [employer]. Mantenga los omplatos juntos, llvelos hacia el centro de la espalda. 1. Mantenga esta posicin durante __________ segundos. 2. Baje lentamente el brazo y vuelva a la posicin inicial. Repita __________ veces. Realice este ejercicio __________ veces al da. Esta informacin no tiene Marine scientist el consejo del mdico. Asegrese de hacerle al mdico cualquier pregunta que tenga. Document Released: 11/10/2005 Document Revised: 06/10/2014 Document Reviewed: 12/27/2014 Elsevier Interactive Patient Education  [employer].

## 2016-08-29 NOTE — Progress Notes (Signed)
Subjective:    Patient ID: James Cortez, male    DOB: [birth year], 51 y.o.   MRN: [id number] Left MCA 12/08/2015 HPI Chief complaint is right shoulder pain, no falls or trauma to that area. History of CVA with right hemiparesis. Currently receiving OT. Outpatient. Pain is mainly when he moves the arm. No significant neck pain. He is ambulating without difficulty. He is independent with all his self-care and mobility. He is driving. His speech has improved. Spanish Ecologist in room Pain Inventory Average Pain 10 Pain Right Now 6 My pain is intermittent  In the last 24 hours, has pain interfered with the following? General activity 6 Relation with others 6 Enjoyment of life 6 What TIME of day is your pain at its worst? night Sleep (in general) Fair  Pain is worse with: some activites and movements, lifting Pain improves with: rest, therapy/exercise and medication Relief from Meds: 2  Mobility walk without assistance ability to climb steps?  yes do you drive?  yes  Function not employed: date last employed . I need assistance with the following:  bathing  Neuro/Psych weakness numbness confusion depression anxiety loss of taste or smell  Prior Studies Any changes since last visit?  no  Physicians involved in your care Any changes since last visit?  no   Family History  Problem Relation Age of Onset  . Family history unknown: Yes   Social History   Social History  . Marital status: Married    Spouse name: N/A  . Number of children: N/A  . Years of education: N/A   Social History Main Topics  . Smoking status: Former Smoker    Packs/day: 0.00  . Smokeless tobacco: Never Used     Comment: quit 5 months ago-"isn't even having one"  . Alcohol use No  . Drug use: No  . Sexual activity: Not on file   Other Topics Concern  . Not on file   Social History Narrative  . No narrative on file   Past Surgical History:  Procedure  Laterality Date  . EP IMPLANTABLE DEVICE N/A 12/15/2015   Procedure: Loop Recorder Insertion;  Surgeon: Will Meredith Leeds, MD;  Location: [place] [facility];  Service: Cardiovascular;  Laterality: N/A;  . IR GENERIC HISTORICAL  12/08/2015   IR PERCUTANEOUS ART THROMBECTOMY/INFUSION INTRACRANIAL INC DIAG ANGIO 12/08/2015 Luanne Bras, MD [facility]-INTERV RAD  . RADIOLOGY WITH ANESTHESIA Right 12/08/2015   Procedure: RADIOLOGY WITH ANESTHESIA - CODE STROKE;  Surgeon: Medication Radiologist, MD;  Location: [place];  Service: Radiology;  Laterality: Right;  . SKIN SURGERY    . TEE WITHOUT CARDIOVERSION N/A 12/15/2015   Procedure: TRANSESOPHAGEAL ECHOCARDIOGRAM (TEE);  Surgeon: Sueanne Margarita, MD;  Location[facility];  Service: Cardiovascular;  Laterality: N/A;   Past Medical History:  Diagnosis Date  . Skin cancer    There were no vitals taken for this visit.  Opioid Risk Score:   Fall Risk Score:  `1  Depression screen PHQ 2/9  Depression screen [facility] 2/9 06/20/2016 04/22/2016 02/18/2016 01/13/2016  Decreased Interest 0 1 0 3  Down, Depressed, Hopeless 0 0 0 0  PHQ - 2 Score 0 1 0 3  Altered sleeping 0 0 - 0  Tired, decreased energy 0 0 - 0  Change in appetite 0 3 - 0  Feeling bad or failure about yourself  0 0 - 0  Trouble concentrating 0 0 - 3  Moving slowly or fidgety/restless 0 0 - 3  Suicidal thoughts 0 0 - 0  PHQ-9 Score 0 4 - 9     Review of Systems  Constitutional: Positive for unexpected weight change.  HENT: Negative.   Eyes: Negative.   Respiratory: Positive for shortness of breath.        Sometimes feels like lump of saliva in throat that comes and goes   Cardiovascular: Negative.   Gastrointestinal: Negative.   Endocrine: Negative.   Genitourinary: Negative.   Musculoskeletal: Negative.   Skin: Negative.   Allergic/Immunologic: Negative.   Neurological: Negative.   Hematological: Negative.   Psychiatric/Behavioral: Negative.   All other systems reviewed and  are negative.      Objective:   Physical Exam  Constitutional: He is oriented to person, place, and time. He appears well-developed and well-nourished.  HENT:  Head: Normocephalic and atraumatic.  Eyes: Pupils are equal, round, and reactive to light. Conjunctivae and EOM are normal.  Neck: Normal range of motion.  Neurological: He is alert and oriented to person, place, and time. Gait normal.  Motor strength right shoulder cannot be tested due to pain. He has at least 3 minus strength. His biceps, triceps, grip are 4/5 on the right and 5/5 on the left. 5/5 bilateral hip flexor, knee extensor, ankle dorsi flexor. Gait is without evidence of toe drag or knee instability.   Psychiatric: He has a normal mood and affect.  Nursing note and vitals reviewed.  Pain with external rotation as well as with abduction.       Assessment & Plan:  1. Left MCA infarct with residual right upper extremity weakness. He has plateaued in terms of his motor recovery.  2. 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 Recommend injection today. Have given him additional exercises Printed out some exercises that he can do on his own or in conjunction with OT.  Shoulder injection right   Indication: Right Shoulder pain not relieved by medication management and other conservative care.  Informed consent was obtained after describing risks and benefits of the procedure with the patient, this includes bleeding, bruising, infection and medication side effects. The patient wishes to proceed and has given written consent. Patient was placed in a seated position. The right shoulder was marked and prepped with betadine in the subacromial area. A 25-gauge 1-1/2 inch needle was inserted into the subacromial area. After negative draw back for blood, a solution containing 1 mL of 6 mg per ML betamethasone and 4 mL of 1% lidocaine was injected. A band aid was applied. The  patient tolerated the procedure well. Post procedure instructions were given.

## 2016-08-30 ENCOUNTER — Ambulatory Visit: Payer: No Typology Code available for payment source | Admitting: Occupational Therapy

## 2016-08-30 DIAGNOSIS — R278 Other lack of coordination: Secondary | ICD-10-CM

## 2016-08-30 DIAGNOSIS — M25511 Pain in right shoulder: Secondary | ICD-10-CM

## 2016-08-30 DIAGNOSIS — M25611 Stiffness of right shoulder, not elsewhere classified: Secondary | ICD-10-CM

## 2016-08-30 DIAGNOSIS — I69351 Hemiplegia and hemiparesis following cerebral infarction affecting right dominant side: Secondary | ICD-10-CM

## 2016-08-30 MED FILL — ?CLOPIDOGREL 75MG TAB: 75 | 30 days supply | Qty: 30 | Fill #3

## 2016-08-30 NOTE — Therapy (Signed)
[facility] [address] [place], [place], [address] Phone: [phone]   Fax:  [phone]  Occupational Therapy Treatment  Patient Details  Name: James Cortez MRN: [id number] Date of Birth: 1965-04-29 Referring Provider: Dr. Arnoldo Morale  Encounter Date: 08/30/2016      OT End of Session - 08/30/16 1253    Visit Number 9   Number of Visits 17   Date for OT Re-Evaluation 09/30/16   Authorization Type GCCN 100% coverage   Authorization Time Period covered through 10/12/16    OT Start Time 0806   OT Stop Time 0845   OT Time Calculation (min) 39 min   Activity Tolerance Patient tolerated treatment well   Behavior During Therapy [facility] for tasks assessed/performed      Past Medical History:  Diagnosis Date  . Skin cancer     Past Surgical History:  Procedure Laterality Date  . EP IMPLANTABLE DEVICE N/A 12/15/2015   Procedure: Loop Recorder Insertion;  Surgeon: Will Meredith Leeds, MD;  Location: [place] CV LAB;  Service: Cardiovascular;  Laterality: N/A;  . IR GENERIC HISTORICAL  12/08/2015   IR PERCUTANEOUS ART THROMBECTOMY/INFUSION INTRACRANIAL INC DIAG ANGIO 12/08/2015 Luanne Bras, MD [facility]-INTERV RAD  . RADIOLOGY WITH ANESTHESIA Right 12/08/2015   Procedure: RADIOLOGY WITH ANESTHESIA - CODE STROKE;  Surgeon: Medication Radiologist, MD;  Location: [place];  Service: Radiology;  Laterality: Right;  . SKIN SURGERY    . TEE WITHOUT CARDIOVERSION N/A 12/15/2015   Procedure: TRANSESOPHAGEAL ECHOCARDIOGRAM (TEE);  Surgeon: Sueanne Margarita, MD;  Location: [facility] ENDOSCOPY;  Service: Cardiovascular;  Laterality: N/A;    There were no vitals filed for this visit.      Subjective Assessment - 08/30/16 0825    Subjective  Had cortisone shot yesterday, now shoulder is hurting a lot more   Patient is accompained by: Interpreter   Pertinent History L MCA distribution infarct, aphasia, R hemiparesis, R hand  dupuytren's contracture, HTN   Limitations native Spanish speaking, uses interpreter   Patient Stated Goals improve RUE pain and functional use, return to work   Currently in Pain? Yes   Pain Score 10-Worst pain ever  at times   Pain Location --  R shoulder   Pain Orientation Right   Pain Type Chronic pain   Pain Onset More than a month ago   Aggravating Factors  movement, incr after cortisone shot   Pain Relieving Factors proper positioning, rest         Ultrasound   Ultrasound Location R anterior shoulder   Ultrasound Parameters 40mhz, 1.0wts/cm2, 50% pulsed x8 min with no adverse reactions   Ultrasound Goals Pain      In supine, scapular depression with min cueing initially, unable to perform scapular retraction in this position today.  In sidelying, AAROM shoulder flex with scapular facilitation for proper positioning followed by scapular retraction.    Modified quadruped (standing leaning over mat), performed R shoulder ext/scapular retraction with min cueing/facilitation for proper positioning.  In sitting, AAROM shoulder with min facilitation for compensation.  In standing, AAROM shoulder flex and horizontal abduction as able table slides with min cueing for positioning.  Emphasized importance of proper positioning even with pain as guarding has incr again today.  Pt verbalized understanding.  Pt reports that MD gave him exercises (see Epic note).  Recommended pt not perform these at this time due to pain.  Pt verbalized understanding.  OT Short Term Goals - 08/23/16 0827      OT SHORT TERM GOAL #1   Title Pt will be independent with initial HEP.--check STGs 08/30/16   Time 4   Period Weeks   Status Achieved     OT SHORT TERM GOAL #2   Title Pt will report pain less than 6/10 for light BADLs.   Baseline 10/10 with movement   Time 4   Period Weeks   Status Achieved  08/23/16     OT SHORT TERM GOAL #3   Title Pt will  demo at least 105* R shoulder flex for light functional reaching.   Baseline 95*   Time 4   Period Weeks   Status On-going  08/23/16  70* without pain and good positioning     OT SHORT TERM GOAL #4   Title Pt will demo at least 50* R shoulder abduction for lateral reaching/ADLs.   Baseline 35*   Time 4   Period Weeks   Status On-going  08/23/16  35* without pain and good position     OT SHORT TERM GOAL #5   Title Pt will verbalize understanding of proper positioning of RUE/adaptions for ADLs to decr RUE pain.   Time 4   Period Weeks   Status Achieved  08/23/16, but continues to need min cueing at times           OT Long Term Goals - 08/01/16 1929      OT LONG TERM GOAL #1   Title Pt will be independent with updated HEP.--check LTGs 09/30/16   Time 8   Period Weeks   Status New     OT LONG TERM GOAL #2   Title Pt will report pain less than 4/10 for light ADLs/IADLs.   Baseline 10/10 with movement   Time 8   Period Weeks   Status New     OT LONG TERM GOAL #3   Title Pt will demo at least 120* R shoulder flex for light functional reaching.   Baseline 95*   Time 8   Period Weeks   Status New     OT LONG TERM GOAL #4   Title Pt will demo at least 70* R shoulder abduction for lateral reaching/ADLs.   Baseline 35*   Time 8   Period Weeks   Status New     OT LONG TERM GOAL #5   Title Pt will improve R grip strength by at least 15lbs for gripping/lifting tasks.   Baseline R-35lbs   Time 8   Period Weeks   Status New     Long Term Additional Goals   Additional Long Term Goals Yes     OT LONG TERM GOAL #6   Title Pt will improve R hand coordination for ADLs as shown by completing 9-hole peg test in 25sec or less.   Baseline 37.25sec   Time 8   Period Weeks   Status New               Plan - 08/30/16 1254    Clinical Impression Statement Pt presents with incr pain today after cortisone injection yesterday.  Pt demo improved positioning by end of  therapy, but guarding positioning incr today with pain.     Rehab Potential Good   OT Frequency 2x / week   OT Duration 8 weeks   OT Treatment/Interventions Self-care/ADL training;Cryotherapy;Parrafin;Therapeutic exercise;DME and/or AE instruction;Functional Mobility Training;Manual Therapy;Neuromuscular education;Splinting;Fluidtherapy;Ultrasound;Electrical Stimulation;Moist Heat;Contrast Bath;Energy conservation;Passive range of motion;Therapeutic exercises;Therapeutic  activities;Patient/family education   Plan continue with ultrasound to R shoulder, progress with AAROM/AROM and scapular stability as able   OT Home Exercise Plan Education provided:  HEP, proper positiong of RUE    Consulted and Agree with Plan of Care Patient      Patient will benefit from skilled therapeutic intervention in order to improve the following deficits and impairments:  Decreased activity tolerance, Decreased coordination, Decreased knowledge of use of DME, Decreased strength, Impaired UE functional use, Pain, Improper body mechanics, Decreased range of motion, Decreased mobility, Impaired vision/preception, Decreased balance  Visit Diagnosis: Hemiplegia and hemiparesis following cerebral infarction affecting right dominant side (HCC)  Right shoulder pain, unspecified chronicity  Other lack of coordination  Stiffness of right shoulder, not elsewhere classified    Problem List Patient Active Problem List   Diagnosis Date Noted  . Dupuytren's contracture of right hand 02/18/2016  . Gait disturbance, post-stroke 01/04/2016  . Hemiparesis affecting right side as late effect of cerebrovascular accident (CVA) ([place]) 01/04/2016  . Cerebral infarction due to thrombosis of left middle cerebral artery ([place]) 12/15/2015  . Left middle cerebral artery stroke ([place]) 12/15/2015  . Oropharyngeal dysphagia   . Aphasia as late effect of stroke   . Right hemiplegia ([place])   . Acute on chronic respiratory failure ([place])   .  Essential hypertension   . CVA (cerebral vascular accident) [facility]) 12/08/2015    [facility] 08/30/2016, 12:56 PM  [facility] [address] [place], [place], [address] Phone: [phone]   Fax:  [phone]  Name: Wilmore Holsomback MRN: [id number] Date of Birth: 1966/01/01   Vianne Bulls, OTR/L [facility] [address] [place], [place]  [address] [phone] phone [phone] 08/30/16 12:56 PM

## 2016-09-01 ENCOUNTER — Ambulatory Visit: Payer: No Typology Code available for payment source | Admitting: Occupational Therapy

## 2016-09-01 DIAGNOSIS — M25611 Stiffness of right shoulder, not elsewhere classified: Secondary | ICD-10-CM

## 2016-09-01 DIAGNOSIS — M25511 Pain in right shoulder: Secondary | ICD-10-CM

## 2016-09-01 DIAGNOSIS — R278 Other lack of coordination: Secondary | ICD-10-CM

## 2016-09-01 DIAGNOSIS — I69351 Hemiplegia and hemiparesis following cerebral infarction affecting right dominant side: Secondary | ICD-10-CM

## 2016-09-01 NOTE — Therapy (Signed)
[facility] [address] [place], [place], [address] Phone: [phone]   Fax:  [phone]  Occupational Therapy Treatment  Patient Details  Name: James Cortez MRN: [id number] Date of Birth: 28-Oct-1965 Referring Provider: Dr. Arnoldo Morale  Encounter Date: 09/01/2016      OT End of Session - 09/01/16 0824    Visit Number 10   Number of Visits 17   Date for OT Re-Evaluation 09/30/16   Authorization Type GCCN 100% coverage   Authorization Time Period covered through 10/12/16    OT Start Time 0806   OT Stop Time 0845   OT Time Calculation (min) 39 min   Activity Tolerance Patient tolerated treatment well   Behavior During Therapy [facility] for tasks assessed/performed      Past Medical History:  Diagnosis Date  . Skin cancer     Past Surgical History:  Procedure Laterality Date  . EP IMPLANTABLE DEVICE N/A 12/15/2015   Procedure: Loop Recorder Insertion;  Surgeon: Will Meredith Leeds, MD;  Location: [place] CV LAB;  Service: Cardiovascular;  Laterality: N/A;  . IR GENERIC HISTORICAL  12/08/2015   IR PERCUTANEOUS ART THROMBECTOMY/INFUSION INTRACRANIAL INC DIAG ANGIO 12/08/2015 Luanne Bras, MD [facility]-INTERV RAD  . RADIOLOGY WITH ANESTHESIA Right 12/08/2015   Procedure: RADIOLOGY WITH ANESTHESIA - CODE STROKE;  Surgeon: Medication Radiologist, MD;  Location: [place];  Service: Radiology;  Laterality: Right;  . SKIN SURGERY    . TEE WITHOUT CARDIOVERSION N/A 12/15/2015   Procedure: TRANSESOPHAGEAL ECHOCARDIOGRAM (TEE);  Surgeon: Sueanne Margarita, MD;  Location: [facility] ENDOSCOPY;  Service: Cardiovascular;  Laterality: N/A;    There were no vitals filed for this visit.      Subjective Assessment - 09/01/16 0823    Subjective  Pain is better than it was earlier this week   Patient is accompained by: Interpreter   Pertinent History L MCA distribution infarct, aphasia, R hemiparesis, R hand dupuytren's  contracture, HTN   Limitations native Spanish speaking, uses interpreter   Patient Stated Goals improve RUE pain and functional use, return to work   Currently in Pain? Yes   Pain Score 4   3-4/10   Pain Location --  R shoulder   Pain Orientation Right   Pain Descriptors / Indicators Aching   Pain Type Chronic pain   Pain Onset More than a month ago   Pain Frequency Intermittent   Aggravating Factors  movement   Pain Relieving Factors proper positioning, rest        Ultrasound   Ultrasound Location R anterior shoulder   Ultrasound Parameters 70mhz, 1.0wts/cm2, 50% pulsed x8 min with no adverse reactions   Ultrasound Goals Pain      Manual:  Gentle distraction/joint mobs to R shoulder    In supine, scapular depression and scapular retraction with min cueing initially.  In supine, shoulder flex and chest press with BUEs with ball  wiithin pain-free range with min cueing/facilitation for proper positioning.    In sitting, AAROM ER with foam noodle in pain-free range with min cueing.  In prone, scapular retraction with min cueing/facilitation for avoid shoulder hike.  In standing, light wt. Bearing in forward flex on table through [place] with min cueing/facilitation for proper positioning.  In sitting, AAROM shoulder flex with BUEs with ball to approx 90* with min facilitation for compensation.  In standing, AAROM shoulder abduction on table with min cueing for positioning in pain-free range.  OT Short Term Goals - 08/23/16 0827      OT SHORT TERM GOAL #1   Title Pt will be independent with initial HEP.--check STGs 08/30/16   Time 4   Period Weeks   Status Achieved     OT SHORT TERM GOAL #2   Title Pt will report pain less than 6/10 for light BADLs.   Baseline 10/10 with movement   Time 4   Period Weeks   Status Achieved  08/23/16     OT SHORT TERM GOAL #3   Title Pt will demo at least 105* R shoulder flex for light functional  reaching.   Baseline 95*   Time 4   Period Weeks   Status On-going  08/23/16  70* without pain and good positioning     OT SHORT TERM GOAL #4   Title Pt will demo at least 50* R shoulder abduction for lateral reaching/ADLs.   Baseline 35*   Time 4   Period Weeks   Status On-going  08/23/16  35* without pain and good position     OT SHORT TERM GOAL #5   Title Pt will verbalize understanding of proper positioning of RUE/adaptions for ADLs to decr RUE pain.   Time 4   Period Weeks   Status Achieved  08/23/16, but continues to need min cueing at times           OT Long Term Goals - 08/01/16 1929      OT LONG TERM GOAL #1   Title Pt will be independent with updated HEP.--check LTGs 09/30/16   Time 8   Period Weeks   Status New     OT LONG TERM GOAL #2   Title Pt will report pain less than 4/10 for light ADLs/IADLs.   Baseline 10/10 with movement   Time 8   Period Weeks   Status New     OT LONG TERM GOAL #3   Title Pt will demo at least 120* R shoulder flex for light functional reaching.   Baseline 95*   Time 8   Period Weeks   Status New     OT LONG TERM GOAL #4   Title Pt will demo at least 70* R shoulder abduction for lateral reaching/ADLs.   Baseline 35*   Time 8   Period Weeks   Status New     OT LONG TERM GOAL #5   Title Pt will improve R grip strength by at least 15lbs for gripping/lifting tasks.   Baseline R-35lbs   Time 8   Period Weeks   Status New     Long Term Additional Goals   Additional Long Term Goals Yes     OT LONG TERM GOAL #6   Title Pt will improve R hand coordination for ADLs as shown by completing 9-hole peg test in 25sec or less.   Baseline 37.25sec   Time 8   Period Weeks   Status New               Plan - 09/01/16 0825    Clinical Impression Statement Pt with decr pain and is progressing towards goals slowly.  Pt conintues to need cueing for positioning with exercise and rest.   Rehab Potential Good   OT Frequency 2x  / week   OT Duration 8 weeks   OT Treatment/Interventions Self-care/ADL training;Cryotherapy;Parrafin;Therapeutic exercise;DME and/or AE instruction;Functional Mobility Training;Manual Therapy;Neuromuscular education;Splinting;Fluidtherapy;Ultrasound;Electrical Stimulation;Moist Heat;Contrast Bath;Energy conservation;Passive range of motion;Therapeutic exercises;Therapeutic activities;Patient/family education   Plan continue  with ultrasound to R shoulder, ?kinesiotape, progress with AAROM/AROM and scapular stability as able (update HEP prn)   OT Home Exercise Plan Education provided:  HEP, proper positiong of RUE    Consulted and Agree with Plan of Care Patient      Patient will benefit from skilled therapeutic intervention in order to improve the following deficits and impairments:  Decreased activity tolerance, Decreased coordination, Decreased knowledge of use of DME, Decreased strength, Impaired UE functional use, Pain, Improper body mechanics, Decreased range of motion, Decreased mobility, Impaired vision/preception, Decreased balance  Visit Diagnosis: Hemiplegia and hemiparesis following cerebral infarction affecting right dominant side (HCC)  Right shoulder pain, unspecified chronicity  Other lack of coordination  Stiffness of right shoulder, not elsewhere classified    Problem List Patient Active Problem List   Diagnosis Date Noted  . Dupuytren's contracture of right hand 02/18/2016  . Gait disturbance, post-stroke 01/04/2016  . Hemiparesis affecting right side as late effect of cerebrovascular accident (CVA) ([place]) 01/04/2016  . Cerebral infarction due to thrombosis of left middle cerebral artery ([place]) 12/15/2015  . Left middle cerebral artery stroke ([place]) 12/15/2015  . Oropharyngeal dysphagia   . Aphasia as late effect of stroke   . Right hemiplegia ([place])   . Acute on chronic respiratory failure ([place])   . Essential hypertension   . CVA (cerebral vascular accident) [facility])  12/08/2015    [facility] 09/01/2016, 9:57 AM  [place] [facility] [address] [place], [place], [address] Phone: [phone]   Fax:  [phone]  Name: James Cortez MRN: [id number] Date of Birth: 1966-01-02   Vianne Bulls, OTR/L [facility] [address] [place], [place]  [address] [phone] phone [phone] 09/01/16 9:57 AM

## 2016-09-06 ENCOUNTER — Ambulatory Visit: Payer: No Typology Code available for payment source | Admitting: Occupational Therapy

## 2016-09-06 DIAGNOSIS — R278 Other lack of coordination: Secondary | ICD-10-CM

## 2016-09-06 DIAGNOSIS — M25611 Stiffness of right shoulder, not elsewhere classified: Secondary | ICD-10-CM

## 2016-09-06 DIAGNOSIS — M25511 Pain in right shoulder: Secondary | ICD-10-CM

## 2016-09-06 DIAGNOSIS — I69351 Hemiplegia and hemiparesis following cerebral infarction affecting right dominant side: Secondary | ICD-10-CM

## 2016-09-06 DIAGNOSIS — R2681 Unsteadiness on feet: Secondary | ICD-10-CM

## 2016-09-06 NOTE — Therapy (Signed)
[facility] [address] [place], [place], [address] Phone: [phone]   Fax:  [phone]  Occupational Therapy Treatment  Patient Details  Name: James Cortez MRN: [id number] Date of Birth: Jun 04, 1965 Referring Provider: Dr. Arnoldo Morale  Encounter Date: 09/06/2016      OT End of Session - 09/06/16 0954    Visit Number 11   Number of Visits 17   Date for OT Re-Evaluation 09/30/16   Authorization Type GCCN 100% coverage   Authorization Time Period covered through 10/12/16    OT Start Time 0806   OT Stop Time 0845   OT Time Calculation (min) 39 min   Activity Tolerance Patient tolerated treatment well   Behavior During Therapy [facility] for tasks assessed/performed      Past Medical History:  Diagnosis Date  . Skin cancer     Past Surgical History:  Procedure Laterality Date  . EP IMPLANTABLE DEVICE N/A 12/15/2015   Procedure: Loop Recorder Insertion;  Surgeon: Will Meredith Leeds, MD;  Location: [place] CV LAB;  Service: Cardiovascular;  Laterality: N/A;  . IR GENERIC HISTORICAL  12/08/2015   IR PERCUTANEOUS ART THROMBECTOMY/INFUSION INTRACRANIAL INC DIAG ANGIO 12/08/2015 Luanne Bras, MD [facility]-INTERV RAD  . RADIOLOGY WITH ANESTHESIA Right 12/08/2015   Procedure: RADIOLOGY WITH ANESTHESIA - CODE STROKE;  Surgeon: Medication Radiologist, MD;  Location: [place];  Service: Radiology;  Laterality: Right;  . SKIN SURGERY    . TEE WITHOUT CARDIOVERSION N/A 12/15/2015   Procedure: TRANSESOPHAGEAL ECHOCARDIOGRAM (TEE);  Surgeon: Sueanne Margarita, MD;  Location: [facility] ENDOSCOPY;  Service: Cardiovascular;  Laterality: N/A;    There were no vitals filed for this visit.      Subjective Assessment - 09/06/16 0953    Subjective  Pt reports continues shoulder pain   Pertinent History L MCA distribution infarct, aphasia, R hemiparesis, R hand dupuytren's contracture, HTN   Patient Stated Goals improve RUE pain  and functional use, return to work   Currently in Pain? Yes   Pain Score 4    Pain Location Shoulder   Pain Orientation Right   Pain Descriptors / Indicators Aching   Pain Type Chronic pain   Pain Onset More than a month ago   Pain Frequency Intermittent   Aggravating Factors  movement   Pain Relieving Factors proper positioning, rest   Multiple Pain Sites No         Treatment:  Ultrasound   Ultrasound Location R anterior shoulder   Ultrasound Parameters 76mhz, 1.0wts/cm2, 50% pulsed x8 min with no adverse reactions   Ultrasound Goals Pain      Manual:  Gentle distraction/joint mobs to R shoulder    In supine, scapular depression and scapular retraction with min cueing initially.  In supine, shoulder flex and chest press with BUEs with ball  wiithin pain-free range with min cueing/facilitation for proper positioning.    In sitting, AAROM ER with foam noodle in pain-free range with min cueing.  In prone, scapular retraction with min cueing/facilitation for avoid shoulder hike.  In standing, light wt. Bearing in forward flex on table through [place] with min cueing/facilitation for proper positioning.                                     OT Short Term Goals - 08/23/16 0827      OT SHORT TERM GOAL #1   Title Pt  will be independent with initial HEP.--check STGs 08/30/16   Time 4   Period Weeks   Status Achieved     OT SHORT TERM GOAL #2   Title Pt will report pain less than 6/10 for light BADLs.   Baseline 10/10 with movement   Time 4   Period Weeks   Status Achieved  08/23/16     OT SHORT TERM GOAL #3   Title Pt will demo at least 105* R shoulder flex for light functional reaching.   Baseline 95*   Time 4   Period Weeks   Status On-going  08/23/16  70* without pain and good positioning     OT SHORT TERM GOAL #4   Title Pt will demo at least 50* R shoulder abduction for lateral reaching/ADLs.   Baseline 35*   Time 4   Period  Weeks   Status On-going  08/23/16  35* without pain and good position     OT SHORT TERM GOAL #5   Title Pt will verbalize understanding of proper positioning of RUE/adaptions for ADLs to decr RUE pain.   Time 4   Period Weeks   Status Achieved  08/23/16, but continues to need min cueing at times           OT Long Term Goals - 08/01/16 1929      OT LONG TERM GOAL #1   Title Pt will be independent with updated HEP.--check LTGs 09/30/16   Time 8   Period Weeks   Status New     OT LONG TERM GOAL #2   Title Pt will report pain less than 4/10 for light ADLs/IADLs.   Baseline 10/10 with movement   Time 8   Period Weeks   Status New     OT LONG TERM GOAL #3   Title Pt will demo at least 120* R shoulder flex for light functional reaching.   Baseline 95*   Time 8   Period Weeks   Status New     OT LONG TERM GOAL #4   Title Pt will demo at least 70* R shoulder abduction for lateral reaching/ADLs.   Baseline 35*   Time 8   Period Weeks   Status New     OT LONG TERM GOAL #5   Title Pt will improve R grip strength by at least 15lbs for gripping/lifting tasks.   Baseline R-35lbs   Time 8   Period Weeks   Status New     Long Term Additional Goals   Additional Long Term Goals Yes     OT LONG TERM GOAL #6   Title Pt will improve R hand coordination for ADLs as shown by completing 9-hole peg test in 25sec or less.   Baseline 37.25sec   Time 8   Period Weeks   Status New               Plan - 09/06/16 0955    Clinical Impression Statement Pt is progressing towards goals slowly. He remains limited by right shoulder pain.   Rehab Potential Good   OT Frequency 2x / week   OT Duration 8 weeks   Plan ? kinesiotape, AA/ROM, A/ROM scapular stability   OT Home Exercise Plan Education provided:  HEP, proper positiong of RUE    Consulted and Agree with Plan of Care Patient      Patient will benefit from skilled therapeutic intervention in order to improve the following  deficits and impairments:  Decreased activity  tolerance, Decreased coordination, Decreased knowledge of use of DME, Decreased strength, Impaired UE functional use, Pain, Improper body mechanics, Decreased range of motion, Decreased mobility, Impaired vision/preception, Decreased balance  Visit Diagnosis: Hemiplegia and hemiparesis following cerebral infarction affecting right dominant side (HCC)  Right shoulder pain, unspecified chronicity  Other lack of coordination  Stiffness of right shoulder, not elsewhere classified  Unsteadiness on feet    Problem List Patient Active Problem List   Diagnosis Date Noted  . Dupuytren's contracture of right hand 02/18/2016  . Gait disturbance, post-stroke 01/04/2016  . Hemiparesis affecting right side as late effect of cerebrovascular accident (CVA) ([place]) 01/04/2016  . Cerebral infarction due to thrombosis of left middle cerebral artery ([place]) 12/15/2015  . Left middle cerebral artery stroke ([place]) 12/15/2015  . Oropharyngeal dysphagia   . Aphasia as late effect of stroke   . Right hemiplegia ([place])   . Acute on chronic respiratory failure ([place])   . Essential hypertension   . CVA (cerebral vascular accident) ([place]) 12/08/2015    James Cortez 09/06/2016, 9:57 AM Theone Murdoch, OTR/L [phone] Phone: [phone] 10:00 AM 09/06/16 [facility] [address] [place], [place], [address] Phone: [phone]   Fax:  [phone]  Name: James Cortez MRN: [id number] Date of Birth: 02/09/1965

## 2016-09-08 ENCOUNTER — Ambulatory Visit: Payer: No Typology Code available for payment source | Attending: Family Medicine | Admitting: Occupational Therapy

## 2016-09-08 DIAGNOSIS — R278 Other lack of coordination: Secondary | ICD-10-CM

## 2016-09-08 DIAGNOSIS — I69351 Hemiplegia and hemiparesis following cerebral infarction affecting right dominant side: Secondary | ICD-10-CM | POA: Insufficient documentation

## 2016-09-08 DIAGNOSIS — M25611 Stiffness of right shoulder, not elsewhere classified: Secondary | ICD-10-CM

## 2016-09-08 DIAGNOSIS — R2681 Unsteadiness on feet: Secondary | ICD-10-CM | POA: Insufficient documentation

## 2016-09-08 DIAGNOSIS — M25511 Pain in right shoulder: Secondary | ICD-10-CM | POA: Insufficient documentation

## 2016-09-08 NOTE — Therapy (Signed)
[facility] [address] [place], [place], [address] Phone: [phone]   Fax:  [phone]  Occupational Therapy Treatment  Patient Details  Name: James Cortez MRN: [id number] Date of Birth: Sep 04, 1965 Referring Provider: Dr. Arnoldo Morale  Encounter Date: 09/08/2016      OT End of Session - 09/08/16 0910    Visit Number 12   Number of Visits 17   Date for OT Re-Evaluation 09/30/16   Authorization Type GCCN 100% coverage   Authorization Time Period covered through 10/12/16    OT Start Time 0848   OT Stop Time 0927   OT Time Calculation (min) 39 min   Activity Tolerance Patient tolerated treatment well   Behavior During Therapy [facility] for tasks assessed/performed      Past Medical History:  Diagnosis Date  . Skin cancer     Past Surgical History:  Procedure Laterality Date  . EP IMPLANTABLE DEVICE N/A 12/15/2015   Procedure: Loop Recorder Insertion;  Surgeon: Will Meredith Leeds, MD;  Location: [place] [facility];  Service: Cardiovascular;  Laterality: N/A;  . IR GENERIC HISTORICAL  12/08/2015   IR PERCUTANEOUS ART THROMBECTOMY/INFUSION INTRACRANIAL INC DIAG ANGIO 12/08/2015 Luanne Bras, MD [facility]-INTER[facility] RAD  . RADIOLOGY WITH ANESTHESIA Right 12/08/2015   Procedure: RADIOLOGY WITH ANESTHESIA - CODE STROKE;  Surgeon: Medication Radiologist, MD;  Location: [place];  Service: Radiology;  Laterality: Right;  . SKIN SURGERY    . TEE WITHOUT CARDIOVERSION N/A 12/15/2015   Procedure: TRANSESOPHAGEAL ECHOCARDIOGRAM (TEE);  Surgeon: Sueanne Margarita, MD;  Location: [facility] ENDOSCOPY;  Service: Cardiovascular;  Laterality: N/A;    There were no vitals filed for this visit.      Subjective Assessment - 09/08/16 1054    Pertinent History L MCA distribution infarct, aphasia, R hemiparesis, R hand dupuytren's contracture, HTN   Limitations native Spanish speaking, uses interpreter   Patient Stated Goals improve RUE  pain and functional use, return to work   Currently in Pain? Yes   Pain Score 3    Pain Location Shoulder   Pain Orientation Right   Pain Descriptors / Indicators Aching   Pain Type Chronic pain   Pain Onset More than a month ago   Pain Frequency Intermittent   Aggravating Factors  movement   Pain Relieving Factors proper positioning   Multiple Pain Sites No            Treatment: Treatment:  Ultrasound   Ultrasound Location R anterior shoulder   Ultrasound Parameters 66mhz, 1.0wts/cm2, 50% pulsed x8 min with no adverse reactions   Ultrasound Goals Pain      Manual:  Gentle distraction/joint mobs to R shoulder   In supine, scapular depression and scapular retraction with min cueing initially.  In supine, shoulder flex and chest press with BUEs with ball  wiithin pain-free range with min cueing/facilitation for proper positioning.    In sitting, AAROM shoulder flexion then  ER with hemiglide in pain-free range with min cueing.  Closed chain shoulder flexion to grossly 90* with therapist facilitating right shoulder and scapular position.  Arm bike x 5 mins for reciprocal movement, pt maintained grossly 25 rpm.                      OT Short Term Goals - 08/23/16 0827      OT SHORT TERM GOAL #1   Title Pt will be independent with initial HEP.--check STGs 08/30/16   Time 4  Period Weeks   Status Achieved     OT SHORT TERM GOAL #2   Title Pt will report pain less than 6/10 for light BADLs.   Baseline 10/10 with movement   Time 4   Period Weeks   Status Achieved  08/23/16     OT SHORT TERM GOAL #3   Title Pt will demo at least 105* R shoulder flex for light functional reaching.   Baseline 95*   Time 4   Period Weeks   Status On-going  08/23/16  70* without pain and good positioning     OT SHORT TERM GOAL #4   Title Pt will demo at least 50* R shoulder abduction for lateral reaching/ADLs.   Baseline 35*   Time 4   Period Weeks   Status  On-going  08/23/16  35* without pain and good position     OT SHORT TERM GOAL #5   Title Pt will verbalize understanding of proper positioning of RUE/adaptions for ADLs to decr RUE pain.   Time 4   Period Weeks   Status Achieved  08/23/16, but continues to need min cueing at times           OT Long Term Goals - 08/01/16 1929      OT LONG TERM GOAL #1   Title Pt will be independent with updated HEP.--check LTGs 09/30/16   Time 8   Period Weeks   Status New     OT LONG TERM GOAL #2   Title Pt will report pain less than 4/10 for light ADLs/IADLs.   Baseline 10/10 with movement   Time 8   Period Weeks   Status New     OT LONG TERM GOAL #3   Title Pt will demo at least 120* R shoulder flex for light functional reaching.   Baseline 95*   Time 8   Period Weeks   Status New     OT LONG TERM GOAL #4   Title Pt will demo at least 70* R shoulder abduction for lateral reaching/ADLs.   Baseline 35*   Time 8   Period Weeks   Status New     OT LONG TERM GOAL #5   Title Pt will improve R grip strength by at least 15lbs for gripping/lifting tasks.   Baseline R-35lbs   Time 8   Period Weeks   Status New     Long Term Additional Goals   Additional Long Term Goals Yes     OT LONG TERM GOAL #6   Title Pt will improve R hand coordination for ADLs as shown by completing 9-hole peg test in 25sec or less.   Baseline 37.25sec   Time 8   Period Weeks   Status New               Plan - 09/08/16 3762    Clinical Impression Statement Pt is progressing towards goals. He reports that his shoulder pain is improving. (interpreter present.   OT Frequency 2x / week   OT Duration 8 weeks   OT Treatment/Interventions Self-care/ADL training;Cryotherapy;Parrafin;Therapeutic exercise;DME and/or AE instruction;Functional Mobility Training;Manual Therapy;Neuromuscular education;Splinting;Fluidtherapy;Ultrasound;Electrical Stimulation;Moist Heat;Contrast Bath;Energy conservation;Passive  range of motion;Therapeutic exercises;Therapeutic activities;Patient/family education   Plan AA/ROM, A/ROM , scapular stability   OT Home Exercise Plan Education provided:  HEP, proper positiong of RUE    Consulted and Agree with Plan of Care Patient      Patient will benefit from skilled therapeutic intervention in order to improve the following  deficits and impairments:  Decreased activity tolerance, Decreased coordination, Decreased knowledge of use of DME, Decreased strength, Impaired UE functional use, Pain, Improper body mechanics, Decreased range of motion, Decreased mobility, Impaired vision/preception, Decreased balance  Visit Diagnosis: Hemiplegia and hemiparesis following cerebral infarction affecting right dominant side (HCC)  Right shoulder pain, unspecified chronicity  Other lack of coordination  Stiffness of right shoulder, not elsewhere classified    Problem List Patient Active Problem List   Diagnosis Date Noted  . Dupuytren's contracture of right hand 02/18/2016  . Gait disturbance, post-stroke 01/04/2016  . Hemiparesis affecting right side as late effect of cerebrovascular accident (CVA) ([place]) 01/04/2016  . Cerebral infarction due to thrombosis of left middle cerebral artery ([place]) 12/15/2015  . Left middle cerebral artery stroke ([place]) 12/15/2015  . Oropharyngeal dysphagia   . Aphasia as late effect of stroke   . Right hemiplegia ([place])   . Acute on chronic respiratory failure ([place])   . Essential hypertension   . CVA (cerebral vascular accident) ([place]) 12/08/2015    [place] 09/08/2016, 10:55 AM Theone Murdoch, OTR/L [phone] Phone: [phone] 10:58 AM 09/08/16 [place] [address] [place], [place], [address] Phone: [phone]   Fax:  [phone]  Name: Handy Mcloud MRN: [id number] Date of Birth: [birth year]

## 2016-09-12 ENCOUNTER — Ambulatory Visit (INDEPENDENT_AMBULATORY_CARE_PROVIDER_SITE_OTHER): Payer: No Typology Code available for payment source | Admitting: *Deleted

## 2016-09-12 DIAGNOSIS — I639 Cerebral infarction, unspecified: Secondary | ICD-10-CM

## 2016-09-13 ENCOUNTER — Ambulatory Visit: Payer: No Typology Code available for payment source | Admitting: Occupational Therapy

## 2016-09-13 DIAGNOSIS — M25511 Pain in right shoulder: Secondary | ICD-10-CM

## 2016-09-13 DIAGNOSIS — M25611 Stiffness of right shoulder, not elsewhere classified: Secondary | ICD-10-CM

## 2016-09-13 DIAGNOSIS — I69351 Hemiplegia and hemiparesis following cerebral infarction affecting right dominant side: Secondary | ICD-10-CM

## 2016-09-13 DIAGNOSIS — R278 Other lack of coordination: Secondary | ICD-10-CM

## 2016-09-13 NOTE — Therapy (Signed)
[facility] [address] [place], [place], [address] Phone: [phone]   Fax:  [phone]  Occupational Therapy Treatment  Patient Details  Name: James Cortez MRN: [id number] Date of Birth: 01-Feb-1966 Referring Provider: Dr. Arnoldo Morale  Encounter Date: 09/13/2016      OT End of Session - 09/13/16 0823    Visit Number 13   Number of Visits 17   Date for OT Re-Evaluation 09/30/16   Authorization Type GCCN 100% coverage   Authorization Time Period covered through 10/12/16    OT Start Time 0808   OT Stop Time 0848   OT Time Calculation (min) 40 min   Activity Tolerance Patient tolerated treatment well   Behavior During Therapy [facility] for tasks assessed/performed      Past Medical History:  Diagnosis Date  . Skin cancer     Past Surgical History:  Procedure Laterality Date  . EP IMPLANTABLE DEVICE N/A 12/15/2015   Procedure: Loop Recorder Insertion;  Surgeon: Will Meredith Leeds, MD;  Location: [place] [facility];  Service: Cardiovascular;  Laterality: N/A;  . IR GENERIC HISTORICAL  12/08/2015   IR PERCUTANEOUS ART THROMBECTOMY/INFUSION INTRACRANIAL INC DIAG ANGIO 12/08/2015 Luanne Bras, MD [facility]INTER[facility] RAD  . RADIOLOGY WITH ANESTHESIA Right 12/08/2015   Procedure: RADIOLOGY WITH ANESTHESIA - CODE STROKE;  Surgeon: Medication Radiologist, MD;  Location: [place];  Service: Radiology;  Laterality: Right;  . SKIN SURGERY    . TEE WITHOUT CARDIOVERSION N/A 12/15/2015   Procedure: TRANSESOPHAGEAL ECHOCARDIOGRAM (TEE);  Surgeon: Sueanne Margarita, MD;  Location: [facility] ENDOSCOPY;  Service: Cardiovascular;  Laterality: N/A;    There were no vitals filed for this visit.      Subjective Assessment - 09/13/16 0822    Subjective  Pt reports shoulder pain only if he moves it wrong.  It is no longer hurting at night.   Pertinent History L MCA distribution infarct, aphasia, R hemiparesis, R hand dupuytren's  contracture, HTN   Limitations native Spanish speaking, uses interpreter   Patient Stated Goals improve RUE pain and functional use, return to work   Currently in Pain? No/denies   Pain Onset More than a month ago          Ultrasound   Ultrasound Location R anterior shoulder   Ultrasound Parameters 46mhz, 1.0wts/cm2, 50% pulsed x8 min with no adverse reactions   Ultrasound Goals Pain        In supine, scapular depression and scapular retraction with min cueing initially.  In supine, shoulder flex and chest press with BUEs with ball  wiithin pain-free range with min cueing/facilitation for proper positioning.    In supine, AAROM shoulder abduction with cane with min cueing for positioning in pain-free range.  In sitting, AAROM ER with foam noodle in pain-free range with min cueing.  In prone, scapular retraction with min cueing/facilitation for avoid shoulder hike, LUE off mat.  In standing, light wt. Bearing in forward flex on table through [place] with min cueing/facilitation for proper positioning.  In standing, AAROM shoulder flex with hemi-glide to approx 100* with scapular facilitation.  Arm bike x 35min level 1 for reciprocal movement with min cueing for positioning/avoid R shoulder hike.                        OT Short Term Goals - 08/23/16 0827      OT SHORT TERM GOAL #1   Title Pt will be independent with initial  HEP.--check STGs 08/30/16   Time 4   Period Weeks   Status Achieved     OT SHORT TERM GOAL #2   Title Pt will report pain less than 6/10 for light BADLs.   Baseline 10/10 with movement   Time 4   Period Weeks   Status Achieved  08/23/16     OT SHORT TERM GOAL #3   Title Pt will demo at least 105* R shoulder flex for light functional reaching.   Baseline 95*   Time 4   Period Weeks   Status On-going  08/23/16  70* without pain and good positioning     OT SHORT TERM GOAL #4   Title Pt will demo at least 50* R  shoulder abduction for lateral reaching/ADLs.   Baseline 35*   Time 4   Period Weeks   Status On-going  08/23/16  35* without pain and good position     OT SHORT TERM GOAL #5   Title Pt will verbalize understanding of proper positioning of RUE/adaptions for ADLs to decr RUE pain.   Time 4   Period Weeks   Status Achieved  08/23/16, but continues to need min cueing at times           OT Long Term Goals - 08/01/16 1929      OT LONG TERM GOAL #1   Title Pt will be independent with updated HEP.--check LTGs 09/30/16   Time 8   Period Weeks   Status New     OT LONG TERM GOAL #2   Title Pt will report pain less than 4/10 for light ADLs/IADLs.   Baseline 10/10 with movement   Time 8   Period Weeks   Status New     OT LONG TERM GOAL #3   Title Pt will demo at least 120* R shoulder flex for light functional reaching.   Baseline 95*   Time 8   Period Weeks   Status New     OT LONG TERM GOAL #4   Title Pt will demo at least 70* R shoulder abduction for lateral reaching/ADLs.   Baseline 35*   Time 8   Period Weeks   Status New     OT LONG TERM GOAL #5   Title Pt will improve R grip strength by at least 15lbs for gripping/lifting tasks.   Baseline R-35lbs   Time 8   Period Weeks   Status New     Long Term Additional Goals   Additional Long Term Goals Yes     OT LONG TERM GOAL #6   Title Pt will improve R hand coordination for ADLs as shown by completing 9-hole peg test in 25sec or less.   Baseline 37.25sec   Time 8   Period Weeks   Status New               Plan - 09/13/16 1610    Clinical Impression Statement Pt is progressing towards goals with improving shoulder pain (none today) and improved movement.   OT Frequency 2x / week   OT Duration 8 weeks   OT Treatment/Interventions Self-care/ADL training;Cryotherapy;Parrafin;Therapeutic exercise;DME and/or AE instruction;Functional Mobility Training;Manual Therapy;Neuromuscular  education;Splinting;Fluidtherapy;Ultrasound;Electrical Stimulation;Moist Heat;Contrast Bath;Energy conservation;Passive range of motion;Therapeutic exercises;Therapeutic activities;Patient/family education   Plan continue with AAROM, AROM, scapular stability   OT Home Exercise Plan Education provided:  HEP, proper positiong of RUE    Consulted and Agree with Plan of Care Patient      Patient will benefit from  skilled therapeutic intervention in order to improve the following deficits and impairments:  Decreased activity tolerance, Decreased coordination, Decreased knowledge of use of DME, Decreased strength, Impaired UE functional use, Pain, Improper body mechanics, Decreased range of motion, Decreased mobility, Impaired vision/preception, Decreased balance  Visit Diagnosis: Hemiplegia and hemiparesis following cerebral infarction affecting right dominant side (HCC)  Right shoulder pain, unspecified chronicity  Other lack of coordination  Stiffness of right shoulder, not elsewhere classified    Problem List Patient Active Problem List   Diagnosis Date Noted  . Dupuytren's contracture of right hand 02/18/2016  . Gait disturbance, post-stroke 01/04/2016  . Hemiparesis affecting right side as late effect of cerebrovascular accident (CVA) ([place]) 01/04/2016  . Cerebral infarction due to thrombosis of left middle cerebral artery ([place]) 12/15/2015  . Left middle cerebral artery stroke ([place]) 12/15/2015  . Oropharyngeal dysphagia   . Aphasia as late effect of stroke   . Right hemiplegia ([place])   . Acute on chronic respiratory failure ([place])   . Essential hypertension   . CVA (cerebral vascular accident) [facility]) 12/08/2015    [facility] 09/13/2016, 8:46 AM  [place] [address] [place], [place], [address] Phone: [phone]   Fax:  [phone]  Name: Tirrell Buchberger MRN: [id number] Date of Birth: 1965/03/08    Vianne Bulls, OTR/L [facility] [address] [place], [place]  [address] [phone] phone [phone] 09/13/16 8:46 AM

## 2016-09-13 NOTE — Progress Notes (Signed)
Carelink Summary Report / Loop Recorder 

## 2016-09-15 ENCOUNTER — Encounter: Payer: Self-pay | Admitting: Occupational Therapy

## 2016-09-15 ENCOUNTER — Ambulatory Visit: Payer: No Typology Code available for payment source | Admitting: Occupational Therapy

## 2016-09-15 DIAGNOSIS — R2681 Unsteadiness on feet: Secondary | ICD-10-CM

## 2016-09-15 DIAGNOSIS — I69351 Hemiplegia and hemiparesis following cerebral infarction affecting right dominant side: Secondary | ICD-10-CM

## 2016-09-15 DIAGNOSIS — M25611 Stiffness of right shoulder, not elsewhere classified: Secondary | ICD-10-CM

## 2016-09-15 DIAGNOSIS — M25511 Pain in right shoulder: Secondary | ICD-10-CM

## 2016-09-15 DIAGNOSIS — R278 Other lack of coordination: Secondary | ICD-10-CM

## 2016-09-15 NOTE — Therapy (Signed)
[facility] [place] [address] [place], [place], [address] Phone: [phone]   Fax:  [phone]  Occupational Therapy Treatment  Patient Details  Name: James Cortez MRN: [id number] Date of Birth: December 05, 1965 Referring Provider: Dr. Arnoldo Morale  Encounter Date: 09/15/2016      OT End of Session - 09/15/16 1155    Visit Number 14   Number of Visits 17   Date for OT Re-Evaluation 09/30/16   Authorization Type GCCN 100% coverage   Authorization Time Period covered through 10/12/16    OT Start Time 1017   OT Stop Time 1103   OT Time Calculation (min) 46 min   Activity Tolerance Patient tolerated treatment well      Past Medical History:  Diagnosis Date  . Skin cancer     Past Surgical History:  Procedure Laterality Date  . EP IMPLANTABLE DEVICE N/A 12/15/2015   Procedure: Loop Recorder Insertion;  Surgeon: Will Meredith Leeds, MD;  Location: [place] [facility];  Service: Cardiovascular;  Laterality: N/A;  . IR GENERIC HISTORICAL  12/08/2015   IR PERCUTANEOUS ART THROMBECTOMY/INFUSION INTRACRANIAL INC DIAG ANGIO 12/08/2015 Luanne Bras, MD [facility]-INTERV RAD  . RADIOLOGY WITH ANESTHESIA Right 12/08/2015   Procedure: RADIOLOGY WITH ANESTHESIA - CODE STROKE;  Surgeon: Medication Radiologist, MD;  Location: [place];  Service: Radiology;  Laterality: Right;  . SKIN SURGERY    . TEE WITHOUT CARDIOVERSION N/A 12/15/2015   Procedure: TRANSESOPHAGEAL ECHOCARDIOGRAM (TEE);  Surgeon: Sueanne Margarita, MD;  Location: [facility] ENDOSCOPY;  Service: Cardiovascular;  Laterality: N/A;    There were no vitals filed for this visit.      Subjective Assessment - 09/15/16 1026    Pain Location (P)  Axilla                      OT Treatments/Exercises (OP) - 09/15/16 0001      Neurological Re-education Exercises   Other Exercises 1 Neuro re ed following manual therapy initially in supine in closed chain - pt  eventually able to achieve 120* of shouder flexion in closed chain overhead reach in supine with mod faciliation for scapular alignment.  Addressed posterior control in sidelying with resistance prior to supine overhead reach. Transitioned into sitting and addressed bialteral closed chain overhead reach and pt able to progress to approximately 130* of shoulder flexion with mod faciltation, 115* with  no faciltiaiton. Open chain unilateral reach in supine to approixmately 105* and in sitting to approximately 95*. Pt stated "the pain is much better now."      Manual Therapy   Manual Therapy Joint mobilization;Soft tissue mobilization;Scapular mobilization   Manual therapy comments joint, soft tissue and scap mob to address scapula relative to trunk and scapula relative to humeral head in prep for reach.  Pt initially with significant pain however greatly imrproved through session.  Pt with significant malpositioning as well as guarding which both contribute to increased pain with movement.                    OT Short Term Goals - 09/15/16 1154      OT SHORT TERM GOAL #1   Title Pt will be independent with initial HEP.--check STGs 08/30/16   Time 4   Period Weeks   Status Achieved     OT SHORT TERM GOAL #2   Title Pt will report pain less than 6/10 for light BADLs.   Baseline 10/10 with movement  Time 4   Period Weeks   Status Achieved  08/23/16     OT SHORT TERM GOAL #3   Title Pt will demo at least 105* R shoulder flex for light functional reaching.   Baseline 95*   Time 4   Period Weeks   Status On-going  08/23/16  70* without pain and good positioning     OT SHORT TERM GOAL #4   Title Pt will demo at least 50* R shoulder abduction for lateral reaching/ADLs.   Baseline 35*   Time 4   Period Weeks   Status On-going  08/23/16  35* without pain and good position     OT SHORT TERM GOAL #5   Title Pt will verbalize understanding of proper positioning of RUE/adaptions for  ADLs to decr RUE pain.   Time 4   Period Weeks   Status Achieved  08/23/16, but continues to need min cueing at times           OT Long Term Goals - 09/15/16 1154      OT LONG TERM GOAL #1   Title Pt will be independent with updated HEP.--check LTGs 09/30/16   Time 8   Period Weeks   Status New     OT LONG TERM GOAL #2   Title Pt will report pain less than 4/10 for light ADLs/IADLs.   Baseline 10/10 with movement   Time 8   Period Weeks   Status New     OT LONG TERM GOAL #3   Title Pt will demo at least 120* R shoulder flex for light functional reaching.   Baseline 95*   Time 8   Period Weeks   Status New     OT LONG TERM GOAL #4   Title Pt will demo at least 70* R shoulder abduction for lateral reaching/ADLs.   Baseline 35*   Time 8   Period Weeks   Status New     OT LONG TERM GOAL #5   Title Pt will improve R grip strength by at least 15lbs for gripping/lifting tasks.   Baseline R-35lbs   Time 8   Period Weeks   Status New     OT LONG TERM GOAL #6   Title Pt will improve R hand coordination for ADLs as shown by completing 9-hole peg test in 25sec or less.   Baseline 37.25sec   Time 8   Period Weeks   Status New               Plan - 09/15/16 1154    Clinical Impression Statement Pt progressing toward goals. Overall pt reports less pain and within session pt with signficant gains.    Rehab Potential Good   OT Frequency 2x / week   OT Duration 8 weeks   OT Treatment/Interventions Self-care/ADL training;Cryotherapy;Parrafin;Therapeutic exercise;DME and/or AE instruction;Functional Mobility Training;Manual Therapy;Neuromuscular education;Splinting;Fluidtherapy;Ultrasound;Electrical Stimulation;Moist Heat;Contrast Bath;Energy conservation;Passive range of motion;Therapeutic exercises;Therapeutic activities;Patient/family education   Plan NMR for trunk and RUE, AAROM, AROM, scapular alignment and stability   Consulted and Agree with Plan of Care Patient       Patient will benefit from skilled therapeutic intervention in order to improve the following deficits and impairments:  Decreased activity tolerance, Decreased coordination, Decreased knowledge of use of DME, Decreased strength, Impaired UE functional use, Pain, Improper body mechanics, Decreased range of motion, Decreased mobility, Impaired vision/preception, Decreased balance  Visit Diagnosis: Hemiplegia and hemiparesis following cerebral infarction affecting right dominant side (HCC)  Right shoulder pain,  unspecified chronicity  Other lack of coordination  Stiffness of right shoulder, not elsewhere classified  Unsteadiness on feet    Problem List Patient Active Problem List   Diagnosis Date Noted  . Dupuytren's contracture of right hand 02/18/2016  . Gait disturbance, post-stroke 01/04/2016  . Hemiparesis affecting right side as late effect of cerebrovascular accident (CVA) ([place]) 01/04/2016  . Cerebral infarction due to thrombosis of left middle cerebral artery ([place]) 12/15/2015  . Left middle cerebral artery stroke ([place]) 12/15/2015  . Oropharyngeal dysphagia   . Aphasia as late effect of stroke   . Right hemiplegia ([place])   . Acute on chronic respiratory failure ([place])   . Essential hypertension   . CVA (cerebral vascular accident) [facility]) 12/08/2015    Quay Burow, OTR/L 09/15/2016, 11:57 AM  [place] [address] [place], [place], [address] Phone: [phone]   Fax:  [phone]  Name: Layson Bertsch MRN: [id number] Date of Birth: 07/23/1965

## 2016-09-20 ENCOUNTER — Ambulatory Visit: Payer: No Typology Code available for payment source | Admitting: Occupational Therapy

## 2016-09-20 DIAGNOSIS — I69351 Hemiplegia and hemiparesis following cerebral infarction affecting right dominant side: Secondary | ICD-10-CM

## 2016-09-20 DIAGNOSIS — M25611 Stiffness of right shoulder, not elsewhere classified: Secondary | ICD-10-CM

## 2016-09-20 DIAGNOSIS — M25511 Pain in right shoulder: Secondary | ICD-10-CM

## 2016-09-20 DIAGNOSIS — R278 Other lack of coordination: Secondary | ICD-10-CM

## 2016-09-20 NOTE — Therapy (Signed)
[facility] [address] [place], [place], [address] Phone: [phone]   Fax:  [phone]  Occupational Therapy Treatment  Patient Details  Name: James Cortez MRN: [id number] Date of Birth: 10/19/1965 Referring Provider: Dr. Arnoldo Morale  Encounter Date: 09/20/2016      OT End of Session - 09/20/16 0828    Visit Number 15   Number of Visits 17   Date for OT Re-Evaluation 09/30/16   Authorization Type GCCN 100% coverage   Authorization Time Period covered through 10/12/16    OT Start Time 0806   OT Stop Time 0848   OT Time Calculation (min) 42 min   Activity Tolerance Patient tolerated treatment well   Behavior During Therapy [facility] for tasks assessed/performed      Past Medical History:  Diagnosis Date  . Skin cancer     Past Surgical History:  Procedure Laterality Date  . EP IMPLANTABLE DEVICE N/A 12/15/2015   Procedure: Loop Recorder Insertion;  Surgeon: Will Meredith Leeds, MD;  Location: [place] [facility];  Service: Cardiovascular;  Laterality: N/A;  . IR GENERIC HISTORICAL  12/08/2015   IR PERCUTANEOUS ART THROMBECTOMY/INFUSION INTRACRANIAL INC DIAG ANGIO 12/08/2015 Luanne Bras, MD [facility]-INTERV RAD  . RADIOLOGY WITH ANESTHESIA Right 12/08/2015   Procedure: RADIOLOGY WITH ANESTHESIA - CODE STROKE;  Surgeon: Medication Radiologist, MD;  Location: [place];  Service: Radiology;  Laterality: Right;  . SKIN SURGERY    . TEE WITHOUT CARDIOVERSION N/A 12/15/2015   Procedure: TRANSESOPHAGEAL ECHOCARDIOGRAM (TEE);  Surgeon: Sueanne Margarita, MD;  Location: [facility] ENDOSCOPY;  Service: Cardiovascular;  Laterality: N/A;    There were no vitals filed for this visit.      Subjective Assessment - 09/20/16 0826    Subjective  It's better   Patient is accompained by: Interpreter;Family member  son   Pertinent History L MCA distribution infarct, aphasia, R hemiparesis, R hand dupuytren's contracture, HTN   Limitations native Spanish speaking, uses interpreter   Patient Stated Goals improve RUE pain and functional use, return to work   Currently in Pain? Yes   Pain Score 4    Pain Location Shoulder   Pain Orientation Right   Pain Descriptors / Indicators Aching;Sharp   Pain Type Chronic pain   Pain Onset More than a month ago   Pain Frequency Intermittent   Aggravating Factors  when I move it overhead   Pain Relieving Factors proper positioning, rest       Ultrasound   Ultrasound Location R anterior shoulder   Ultrasound Parameters 67mhz, 1.0wts/cm2, 50% pulsed x8 min with no adverse reactions   Ultrasound Goals Pain        In supine, scapular depression and scapular retraction with min cueing initially.  In supine, shoulder flex and chest press with BUEs with ball  wiithin pain-free range with min cueing/facilitation for proper positioning/scapular stability.    In supine, AAROM shoulder abduction with foam noodle with min cueing for positioning in pain-free range (to approx 70*).  In sitting, AAROM ER with foam noodle in pain-free range with min cueing.  In prone, scapular retraction with min cueing/facilitation for avoid shoulder hike, LUE off mat.  In prone, wt. Bearing through bilateral elbows with head/chest lift for scapular depression with min cueing and no pain.  In sitting, AAROM shoulder flex with BUEs with ball to approx 125* without pain, min cueing/facilitation for shoulder positioning.  OT Education - 09/20/16 0911    Education Details Avoid putting RUE in lap to avoid IR, avoid "elbow out position" for more neutral shoulder positioning   Person(s) Educated Patient   Methods Explanation;Demonstration   Comprehension Verbalized understanding          OT Short Term Goals - 09/20/16 0912      OT SHORT TERM GOAL #1   Title Pt will be independent with initial HEP.--check STGs 08/30/16   Time 4    Period Weeks   Status Achieved     OT SHORT TERM GOAL #2   Title Pt will report pain less than 6/10 for light BADLs.   Baseline 10/10 with movement   Time 4   Period Weeks   Status Achieved  08/23/16     OT SHORT TERM GOAL #3   Title Pt will demo at least 105* R shoulder flex for light functional reaching.   Baseline 95*   Time 4   Period Weeks   Status Achieved  08/23/16  70* without pain and good positioning.  09/20/16:  110* without pain     OT SHORT TERM GOAL #4   Title Pt will demo at least 50* R shoulder abduction for lateral reaching/ADLs.   Baseline 35*   Time 4   Period Weeks   Status On-going  08/23/16  35* without pain and good position     OT SHORT TERM GOAL #5   Title Pt will verbalize understanding of proper positioning of RUE/adaptions for ADLs to decr RUE pain.   Time 4   Period Weeks   Status Achieved  08/23/16, but continues to need min cueing at times           OT Long Term Goals - 09/20/16 0913      OT LONG TERM GOAL #1   Title Pt will be independent with updated HEP.--check LTGs 09/30/16   Time 8   Period Weeks   Status New     OT LONG TERM GOAL #2   Title Pt will report pain less than 4/10 for light ADLs/IADLs.   Baseline 10/10 with movement   Time 8   Period Weeks   Status New     OT LONG TERM GOAL #3   Title Pt will demo at least 120* R shoulder flex for light functional reaching.   Baseline 95*   Time 8   Period Weeks   Status New     OT LONG TERM GOAL #4   Title Pt will demo at least 70* R shoulder abduction for lateral reaching/ADLs.   Baseline 35*   Time 8   Period Weeks   Status New     OT LONG TERM GOAL #5   Title Pt will improve R grip strength by at least 15lbs for gripping/lifting tasks.   Baseline R-35lbs   Time 8   Period Weeks   Status New     OT LONG TERM GOAL #6   Title Pt will improve R hand coordination for ADLs as shown by completing 9-hole peg test in 25sec or less.   Baseline 37.25sec   Time 8   Period  Weeks   Status New               Plan - 09/20/16 9758    Clinical Impression Statement Pt is progressing towards goals with decr pain and improved ROM.   Rehab Potential Good   OT Frequency 2x / week   OT Duration  8 weeks   OT Treatment/Interventions Self-care/ADL training;Cryotherapy;Parrafin;Therapeutic exercise;DME and/or AE instruction;Functional Mobility Training;Manual Therapy;Neuromuscular education;Splinting;Fluidtherapy;Ultrasound;Electrical Stimulation;Moist Heat;Contrast Bath;Energy conservation;Passive range of motion;Therapeutic exercises;Therapeutic activities;Patient/family education   Plan begin checking goals with anticipated renewal next week   OT Home Exercise Plan Education provided:  HEP, proper positiong of RUE    Consulted and Agree with Plan of Care Patient      Patient will benefit from skilled therapeutic intervention in order to improve the following deficits and impairments:  Decreased activity tolerance, Decreased coordination, Decreased knowledge of use of DME, Decreased strength, Impaired UE functional use, Pain, Improper body mechanics, Decreased range of motion, Decreased mobility, Impaired vision/preception, Decreased balance  Visit Diagnosis: Hemiplegia and hemiparesis following cerebral infarction affecting right dominant side (HCC)  Right shoulder pain, unspecified chronicity  Stiffness of right shoulder, not elsewhere classified  Other lack of coordination    Problem List Patient Active Problem List   Diagnosis Date Noted  . Dupuytren's contracture of right hand 02/18/2016  . Gait disturbance, post-stroke 01/04/2016  . Hemiparesis affecting right side as late effect of cerebrovascular accident (CVA) ([place]) 01/04/2016  . Cerebral infarction due to thrombosis of left middle cerebral artery ([place]) 12/15/2015  . Left middle cerebral artery stroke ([place]) 12/15/2015  . Oropharyngeal dysphagia   . Aphasia as late effect of stroke   . Right  hemiplegia ([place])   . Acute on chronic respiratory failure ([place])   . Essential hypertension   . CVA (cerebral vascular accident) [facility]) 12/08/2015    [facility] 09/20/2016, 9:14 AM  [facility] [address] [place], [place], [address] Phone: [phone]   Fax:  [phone]  Name: Sharif Rendell MRN: [id number] Date of Birth: 26-Apr-1965   Vianne Bulls, OTR/L [facility] [address] [place], [place]  [address] [phone] phone [phone] 09/20/16 9:14 AM

## 2016-09-21 LAB — CUP PACEART REMOTE DEVICE CHECK
Date Time Interrogation Session: 20180804223908
MDC IDC PG IMPLANT DT: 20171107

## 2016-09-22 ENCOUNTER — Ambulatory Visit: Payer: No Typology Code available for payment source | Attending: Family Medicine | Admitting: Occupational Therapy

## 2016-09-22 DIAGNOSIS — I69351 Hemiplegia and hemiparesis following cerebral infarction affecting right dominant side: Secondary | ICD-10-CM | POA: Insufficient documentation

## 2016-09-22 DIAGNOSIS — M25611 Stiffness of right shoulder, not elsewhere classified: Secondary | ICD-10-CM | POA: Insufficient documentation

## 2016-09-22 DIAGNOSIS — R278 Other lack of coordination: Secondary | ICD-10-CM | POA: Insufficient documentation

## 2016-09-22 DIAGNOSIS — M25511 Pain in right shoulder: Secondary | ICD-10-CM | POA: Insufficient documentation

## 2016-09-22 MED FILL — ?ATORVASTATIN 20 MG TABLET: 20 | 30 days supply | Qty: 30 | Fill #1

## 2016-09-22 NOTE — Therapy (Signed)
[facility] [address] [place], [place], [address] Phone: [phone]   Fax:  [phone]  Occupational Therapy Treatment  Patient Details  Name: James Cortez MRN: [id number] Date of Birth: [birth year] Referring Provider: Dr. Arnoldo Morale  Encounter Date: 09/22/2016      OT End of Session - 09/22/16 0822    Visit Number 16   Number of Visits 17   Date for OT Re-Evaluation 09/30/16   Authorization Type GCCN 100% coverage   Authorization Time Period covered through 10/12/16    OT Start Time 0806   OT Stop Time 0846   OT Time Calculation (min) 40 min   Activity Tolerance Patient tolerated treatment well   Behavior During Therapy [facility] for tasks assessed/performed      Past Medical History:  Diagnosis Date  . Skin cancer     Past Surgical History:  Procedure Laterality Date  . EP IMPLANTABLE DEVICE N/A 12/15/2015   Procedure: Loop Recorder Insertion;  Surgeon: Will Meredith Leeds, MD;  Location: [place] [facility];  Service: Cardiovascular;  Laterality: N/A;  . IR GENERIC HISTORICAL  12/08/2015   IR PERCUTANEOUS ART THROMBECTOMY/INFUSION INTRACRANIAL INC DIAG ANGIO 12/08/2015 Luanne Bras, MD [facility]-INTERV RAD  . RADIOLOGY WITH ANESTHESIA Right 12/08/2015   Procedure: RADIOLOGY WITH ANESTHESIA - CODE STROKE;  Surgeon: Medication Radiologist, MD;  Location: [place];  Service: Radiology;  Laterality: Right;  . SKIN SURGERY    . TEE WITHOUT CARDIOVERSION N/A 12/15/2015   Procedure: TRANSESOPHAGEAL ECHOCARDIOGRAM (TEE);  Surgeon: Sueanne Margarita, MD;  Location: [facility] ENDOSCOPY;  Service: Cardiovascular;  Laterality: N/A;    There were no vitals filed for this visit.      Subjective Assessment - 09/22/16 0822    Subjective  very little pain, pt reports that he can now get items out of back pocket with RUE without pain and that is begining to move it more automatically   Patient is accompained by:  Interpreter;Family member  son   Pertinent History L MCA distribution infarct, aphasia, R hemiparesis, R hand dupuytren's contracture, HTN   Limitations native Spanish speaking, uses interpreter   Patient Stated Goals improve RUE pain and functional use, return to work   Currently in Pain? Yes   Pain Score 2    Pain Location Shoulder   Pain Orientation Right   Pain Descriptors / Indicators Aching   Pain Type Chronic pain   Pain Onset More than a month ago   Pain Frequency Intermittent   Aggravating Factors  overhead movement   Pain Relieving Factors proper positioning, rest        Ultrasound  Ultrasound Location R anterior shoulder  Ultrasound Parameters 34mhz, 1.0wts/cm2, 50% pulsed x8 min with no adverse reactions  Ultrasound Goals Pain     In supine, scapular depression and scapular retraction with min cueing initially.  In supine, shoulder flex and chest press with BUEs with ball  within pain-free range with min cueing/facilitation for proper positioning/scapular stability.    In supine, AAROM shoulder abduction with foam noodle with min cueing for positioning in pain-free range (to approx 80*).  In sitting, AAROM ER with foam noodle in pain-free range with min cueing.  In prone, scapular retraction with min cueing/facilitation for avoid scapular depression, LUE off mat.  In prone, wt. Bearing through bilateral elbows with head/chest lift for scapular depression with min cueing and no pain.  Arm bike x5 min level 1 for reciprocal movement with min cueing to  avoid shoulder hike, no rest/pain                            OT Short Term Goals - 09/20/16 0912      OT SHORT TERM GOAL #1   Title Pt will be independent with initial HEP.--check STGs 08/30/16   Time 4   Period Weeks   Status Achieved     OT SHORT TERM GOAL #2   Title Pt will report pain less than 6/10 for light BADLs.   Baseline 10/10 with movement   Time 4   Period Weeks    Status Achieved  08/23/16     OT SHORT TERM GOAL #3   Title Pt will demo at least 105* R shoulder flex for light functional reaching.   Baseline 95*   Time 4   Period Weeks   Status Achieved  08/23/16  70* without pain and good positioning.  09/20/16:  110* without pain     OT SHORT TERM GOAL #4   Title Pt will demo at least 50* R shoulder abduction for lateral reaching/ADLs.   Baseline 35*   Time 4   Period Weeks   Status On-going  08/23/16  35* without pain and good position     OT SHORT TERM GOAL #5   Title Pt will verbalize understanding of proper positioning of RUE/adaptions for ADLs to decr RUE pain.   Time 4   Period Weeks   Status Achieved  08/23/16, but continues to need min cueing at times           OT Long Term Goals - 09/20/16 0913      OT LONG TERM GOAL #1   Title Pt will be independent with updated HEP.--check LTGs 09/30/16   Time 8   Period Weeks   Status New     OT LONG TERM GOAL #2   Title Pt will report pain less than 4/10 for light ADLs/IADLs.   Baseline 10/10 with movement   Time 8   Period Weeks   Status New     OT LONG TERM GOAL #3   Title Pt will demo at least 120* R shoulder flex for light functional reaching.   Baseline 95*   Time 8   Period Weeks   Status New     OT LONG TERM GOAL #4   Title Pt will demo at least 70* R shoulder abduction for lateral reaching/ADLs.   Baseline 35*   Time 8   Period Weeks   Status New     OT LONG TERM GOAL #5   Title Pt will improve R grip strength by at least 15lbs for gripping/lifting tasks.   Baseline R-35lbs   Time 8   Period Weeks   Status New     OT LONG TERM GOAL #6   Title Pt will improve R hand coordination for ADLs as shown by completing 9-hole peg test in 25sec or less.   Baseline 37.25sec   Time 8   Period Weeks   Status New               Plan - 09/22/16 9937    Clinical Impression Statement Pt continues to report decr pain with ADLs and improved movement with less  guarding.   Rehab Potential Good   OT Frequency 2x / week   OT Duration 8 weeks   OT Treatment/Interventions Self-care/ADL training;Cryotherapy;Parrafin;Therapeutic exercise;DME and/or AE instruction;Functional Mobility Training;Manual Therapy;Neuromuscular  education;Splinting;Fluidtherapy;Ultrasound;Electrical Stimulation;Moist Heat;Contrast Bath;Energy conservation;Passive range of motion;Therapeutic exercises;Therapeutic activities;Patient/family education   Plan check remaining goals and renew next visit   OT Home Exercise Plan Education provided:  HEP, proper positiong of RUE    Consulted and Agree with Plan of Care Patient      Patient will benefit from skilled therapeutic intervention in order to improve the following deficits and impairments:  Decreased activity tolerance, Decreased coordination, Decreased knowledge of use of DME, Decreased strength, Impaired UE functional use, Pain, Improper body mechanics, Decreased range of motion, Decreased mobility, Impaired vision/preception, Decreased balance  Visit Diagnosis: Hemiplegia and hemiparesis following cerebral infarction affecting right dominant side (HCC)  Right shoulder pain, unspecified chronicity  Stiffness of right shoulder, not elsewhere classified  Other lack of coordination    Problem List Patient Active Problem List   Diagnosis Date Noted  . Dupuytren's contracture of right hand 02/18/2016  . Gait disturbance, post-stroke 01/04/2016  . Hemiparesis affecting right side as late effect of cerebrovascular accident (CVA) ([place]) 01/04/2016  . Cerebral infarction due to thrombosis of left middle cerebral artery ([place]) 12/15/2015  . Left middle cerebral artery stroke ([place]) 12/15/2015  . Oropharyngeal dysphagia   . Aphasia as late effect of stroke   . Right hemiplegia ([place])   . Acute on chronic respiratory failure ([place])   . Essential hypertension   . CVA (cerebral vascular accident) [facility]) 12/08/2015     [facility] 09/22/2016, 12:45 PM  [facility] [address] [place], [place], [address] Phone: [phone]   Fax:  [phone]  Name: James Cortez MRN: [id number] Date of Birth: May 06, 1965   Vianne Bulls, OTR/L [facility] [address] [place], [place]  [address] [phone] phone [phone] 09/22/16 12:45 PM

## 2016-09-26 ENCOUNTER — Encounter: Payer: Self-pay | Admitting: Family Medicine

## 2016-09-26 ENCOUNTER — Ambulatory Visit: Payer: Self-pay | Attending: Family Medicine | Admitting: Family Medicine

## 2016-09-26 VITALS — BP 144/84 | HR 57 | Temp 97.6°F | Ht 67.0 in | Wt 181.2 lb

## 2016-09-26 DIAGNOSIS — R490 Dysphonia: Secondary | ICD-10-CM | POA: Insufficient documentation

## 2016-09-26 DIAGNOSIS — Z1211 Encounter for screening for malignant neoplasm of colon: Secondary | ICD-10-CM

## 2016-09-26 DIAGNOSIS — Z7982 Long term (current) use of aspirin: Secondary | ICD-10-CM | POA: Insufficient documentation

## 2016-09-26 DIAGNOSIS — Z114 Encounter for screening for human immunodeficiency virus [HIV]: Secondary | ICD-10-CM | POA: Insufficient documentation

## 2016-09-26 DIAGNOSIS — I69328 Other speech and language deficits following cerebral infarction: Secondary | ICD-10-CM | POA: Insufficient documentation

## 2016-09-26 DIAGNOSIS — Z7902 Long term (current) use of antithrombotics/antiplatelets: Secondary | ICD-10-CM | POA: Insufficient documentation

## 2016-09-26 DIAGNOSIS — Z1159 Encounter for screening for other viral diseases: Secondary | ICD-10-CM

## 2016-09-26 DIAGNOSIS — I63512 Cerebral infarction due to unspecified occlusion or stenosis of left middle cerebral artery: Secondary | ICD-10-CM

## 2016-09-26 DIAGNOSIS — I69351 Hemiplegia and hemiparesis following cerebral infarction affecting right dominant side: Secondary | ICD-10-CM | POA: Insufficient documentation

## 2016-09-26 DIAGNOSIS — R030 Elevated blood-pressure reading, without diagnosis of hypertension: Secondary | ICD-10-CM | POA: Insufficient documentation

## 2016-09-26 DIAGNOSIS — Z79899 Other long term (current) drug therapy: Secondary | ICD-10-CM | POA: Insufficient documentation

## 2016-09-26 DIAGNOSIS — Z85828 Personal history of other malignant neoplasm of skin: Secondary | ICD-10-CM | POA: Insufficient documentation

## 2016-09-26 DIAGNOSIS — M79641 Pain in right hand: Secondary | ICD-10-CM | POA: Insufficient documentation

## 2016-09-26 MED ORDER — ATORVASTATIN CALCIUM 20 MG PO TABS: 20.0000 mg | ORAL_TABLET | Freq: Every day | ORAL | 3 refills | Status: DC

## 2016-09-26 MED ORDER — CLOPIDOGREL BISULFATE 75 MG PO TABS: 75.0000 mg | ORAL_TABLET | Freq: Every day | ORAL | 3 refills | Status: DC

## 2016-09-26 MED ORDER — BACLOFEN 10 MG PO TABS: 10.0000 mg | ORAL_TABLET | Freq: Three times a day (TID) | ORAL | 3 refills | Status: DC

## 2016-09-26 MED FILL — BACLOFEN 10 MG TABLET: 10 | 30 days supply | Qty: 90 | Fill #0

## 2016-09-26 MED FILL — ?CLOPIDOGREL 75 MG TABLET: 75 | 30 days supply | Qty: 30 | Fill #0

## 2016-09-26 NOTE — Progress Notes (Signed)
Subjective:  Patient ID: James Cortez, male    DOB: August 19, 1965  Age: 51 y.o. MRN: [id number]  CC: Follow-up   HPI [place] is a 51 year old male with left middle cerebral artery stroke in 12/2015 (with initial presentation of aphasia, right facial droop and right hemiparesis who received TPA with improvement in stroke score) s/p loop recorder placement here for follow-up visit accompanied by his daughter.   He is scheduled to see ENT in 3 days for evaluation of voice hoarseness which he has had ever since his stroke as he tried speech therapy with no improvement.    Also undergoing physical therapy and occupational therapy with resulting improvement in his range of motion of his right hand. He still has residual right hand pain but does not take his gabapentin because he states it is ineffective. Compliant with his baclofen, atorvastatin and Plavix.    His blood pressure is elevated and he has no known history of hypertension.  Past Medical History:  Diagnosis Date  . Skin cancer     Past Surgical History:  Procedure Laterality Date  . EP IMPLANTABLE DEVICE N/A 12/15/2015   Procedure: Loop Recorder Insertion;  Surgeon: Will Meredith Leeds, MD;  Location: [place] [facility];  Service: Cardiovascular;  Laterality: N/A;  . IR GENERIC HISTORICAL  12/08/2015   IR PERCUTANEOUS ART THROMBECTOMY/INFUSION INTRACRANIAL INC DIAG ANGIO 12/08/2015 Luanne Bras, MD [facility]-INTERV RAD  . RADIOLOGY WITH ANESTHESIA Right 12/08/2015   Procedure: RADIOLOGY WITH ANESTHESIA - CODE STROKE;  Surgeon: Medication Radiologist, MD;  Location: [facility];  Service: Radiology;  Laterality: Right;  . SKIN SURGERY    . TEE WITHOUT CARDIOVERSION N/A 12/15/2015   Procedure: TRANSESOPHAGEAL ECHOCARDIOGRAM (TEE);  Surgeon: Sueanne Margarita, MD;  Location: [facility] ENDOSCOPY;  Service: Cardiovascular;  Laterality: N/A;    No Known Allergies   Outpatient Medications Prior to Visit    Medication Sig Dispense Refill  . aspirin 325 MG EC tablet Take 1 tablet (325 mg total) by mouth daily. 30 tablet 2  . atorvastatin (LIPITOR) 20 MG tablet Take 1 tablet (20 mg total) by mouth daily at 6 PM. 30 tablet 3  . baclofen (LIORESAL) 10 MG tablet Take 1 tablet (10 mg total) by mouth 3 (three) times daily. 90 tablet 3  . cetirizine (ZYRTEC) 10 MG tablet Take 1 tablet (10 mg total) by mouth daily. (Patient not taking: Reported on 09/26/2016) 30 tablet 1  . diclofenac sodium (VOLTAREN) 1 % GEL Apply 2 g topically 4 (four) times daily. (Patient not taking: Reported on 09/26/2016) 3 Tube 2  . OVER THE COUNTER MEDICATION Place 1-2 drops into both eyes daily as needed (eye irritation).    . butalbital-acetaminophen-caffeine (FIORICET, ESGIC) 50-325-40 MG tablet Take 1-2 tablets by mouth every 12 (twelve) hours as needed for headache. (Patient not taking: Reported on 09/26/2016) 30 tablet 0  . clopidogrel (PLAVIX) 75 MG tablet Take 1 tablet (75 mg total) by mouth daily with breakfast. (Patient not taking: Reported on 09/26/2016) 30 tablet 3  . gabapentin (NEURONTIN) 300 MG capsule Take 1 capsule (300 mg total) by mouth 2 (two) times daily. (Patient not taking: Reported on 09/26/2016) 60 capsule 3  . hydrocortisone cream 0.5 % Apply 1 application topically 2 (two) times daily. To the face (Patient not taking: Reported on 09/26/2016) 30 g 1  . Selenium Sulfide 2.25 % SHAM Apply 1 application topically 2 (two) times a week. (Patient not taking: Reported on 09/26/2016) 180 mL 0  No facility-administered medications prior to visit.     ROS Review of Systems  Constitutional: Negative for activity change and appetite change.  HENT: Negative for sinus pressure and sore throat.   Eyes: Negative for visual disturbance.  Respiratory: Negative for cough, chest tightness and shortness of breath.   Cardiovascular: Negative for chest pain and leg swelling.  Gastrointestinal: Negative for abdominal distention,  abdominal pain, constipation and diarrhea.  Endocrine: Negative.   Genitourinary: Negative for dysuria.  Musculoskeletal:       See hpi  Skin: Negative for rash.  Allergic/Immunologic: Negative.   Neurological: Positive for weakness. Negative for light-headedness and numbness.  Psychiatric/Behavioral: Negative for dysphoric mood and suicidal ideas.    Objective:  BP (!) 144/84   Pulse (!) 57   Temp 97.6 F (36.4 C) (Oral)   Ht 5\' 7"  (1.702 m)   Wt 181 lb 3.2 oz (82.2 kg)   SpO2 97%   BMI 28.38 kg/m   BP/Weight 09/26/2016 08/29/2016 4/92/0100  Systolic BP 712 197 588  Diastolic BP 84 80 70  Wt. (Lbs) 181.2 - 180  BMI 28.38 - 28.19      Physical Exam Constitutional: He is oriented to person, place, and time. He appears well-developed and well-nourished.  HENT: Mild oropharyngeal erythema  Right Ear: External ear normal.  Left Ear: External ear normal. Cerumen partially obscuring left TM  Cardiovascular: Normal rate, normal heart sounds and intact distal pulses.   No murmur heard. Pulmonary/Chest: Effort normal and breath sounds normal. He has no wheezes. He has no rales. He exhibits no tenderness.  Loop recorder in left upper chest wall  Abdominal: Soft. Bowel sounds are normal. He exhibits no distension and no mass. There is no tenderness.  Musculoskeletal: He exhibits Duputyren's contractures in right hand unable to make a fist in right hand.  Followed elevation of right upper extremity is limited to 100; range of motion in left upper extremities normal.  Neurological: He is alert and oriented to person, place, and time.  Skin: Normal  Assessment & Plan:   1. Hemiparesis affecting right side as late effect of cerebrovascular accident (CVA) (HCC) Improving Continue PT-OT - baclofen (LIORESAL) 10 MG tablet; Take 1 tablet (10 mg total) by mouth 3 (three) times daily.  Dispense: 90 tablet; Refill: 3 - Comprehensive metabolic panel; Future - Lipid panel; Future  2.  Left middle cerebral artery stroke (HCC) Risk factor modification Scheduled to see ENT in 3 days to voice hoarseness. - atorvastatin (LIPITOR) 20 MG tablet; Take 1 tablet (20 mg total) by mouth daily at 6 PM.  Dispense: 30 tablet; Refill: 3 - clopidogrel (PLAVIX) 75 MG tablet; Take 1 tablet (75 mg total) by mouth daily with breakfast.  Dispense: 30 tablet; Refill: 3  3. Elevated blood pressure reading No previous diagnosis of hypertension Low-sodium diet A blood pressure remains elevated at next visit, I will commence antihypertensive.  4. Screening for viral disease - HIV antibody (with reflex); Future  5. Screening for colon cancer - Ambulatory referral to Gastroenterology   Meds ordered this encounter  Medications  . atorvastatin (LIPITOR) 20 MG tablet    Sig: Take 1 tablet (20 mg total) by mouth daily at 6 PM.    Dispense:  30 tablet    Refill:  3  . clopidogrel (PLAVIX) 75 MG tablet    Sig: Take 1 tablet (75 mg total) by mouth daily with breakfast.    Dispense:  30 tablet    Refill:  3  .  baclofen (LIORESAL) 10 MG tablet    Sig: Take 1 tablet (10 mg total) by mouth 3 (three) times daily.    Dispense:  90 tablet    Refill:  3    Follow-up: Return in about 3 months (around 12/27/2016) for Follow-up of chronic medical conditions.   This note has been created with Surveyor, quantity. Any transcriptional errors are unintentional.     Arnoldo Morale MD

## 2016-09-26 NOTE — Patient Instructions (Signed)

## 2016-09-27 ENCOUNTER — Ambulatory Visit: Payer: Self-pay | Attending: Family Medicine

## 2016-09-27 ENCOUNTER — Ambulatory Visit: Payer: No Typology Code available for payment source | Admitting: Occupational Therapy

## 2016-09-27 ENCOUNTER — Encounter: Payer: Self-pay | Admitting: Family Medicine

## 2016-09-27 DIAGNOSIS — M25511 Pain in right shoulder: Secondary | ICD-10-CM

## 2016-09-27 DIAGNOSIS — Z1159 Encounter for screening for other viral diseases: Secondary | ICD-10-CM

## 2016-09-27 DIAGNOSIS — I69351 Hemiplegia and hemiparesis following cerebral infarction affecting right dominant side: Secondary | ICD-10-CM

## 2016-09-27 DIAGNOSIS — M25611 Stiffness of right shoulder, not elsewhere classified: Secondary | ICD-10-CM

## 2016-09-27 DIAGNOSIS — R278 Other lack of coordination: Secondary | ICD-10-CM

## 2016-09-27 NOTE — Therapy (Signed)
[facility] [address] [place], [place], [address] Phone: [phone]   Fax:  [phone]  Occupational Therapy Treatment  Patient Details  Name: James [place] MRN: [id number] Date of Birth: [birth year] Referring Provider: Dr. Arnoldo Morale  Encounter Date: 09/27/2016      OT End of Session - 09/27/16 0821    Visit Number 17   Number of Visits 33  17+16=33   Date for OT Re-Evaluation 11/26/16   Authorization Type [employer] 100% coverage   Authorization Time Period covered through 10/12/16;  renewal completed 09/27/16 for 8 wks   OT Start Time 0803   OT Stop Time 0845   OT Time Calculation (min) 42 min   Activity Tolerance Patient tolerated treatment well   Behavior During Therapy [facility] for tasks assessed/performed      Past Medical History:  Diagnosis Date  . Skin cancer     Past Surgical History:  Procedure Laterality Date  . EP IMPLANTABLE DEVICE N/A 12/15/2015   Procedure: Loop Recorder Insertion;  Surgeon: Will Meredith Leeds, MD;  Location: [place] [facility];  Service: Cardiovascular;  Laterality: N/A;  . IR GENERIC HISTORICAL  12/08/2015   IR PERCUTANEOUS ART THROMBECTOMY/INFUSION INTRACRANIAL INC DIAG ANGIO 12/08/2015 James Bras, MD [facility]-INTERV RAD  . RADIOLOGY WITH ANESTHESIA Right 12/08/2015   Procedure: RADIOLOGY WITH ANESTHESIA - CODE STROKE;  Surgeon: Medication Radiologist, MD;  Location: [place];  Service: Radiology;  Laterality: Right;  . SKIN SURGERY    . TEE WITHOUT CARDIOVERSION N/A 12/15/2015   Procedure: TRANSESOPHAGEAL ECHOCARDIOGRAM (TEE);  Surgeon: James Margarita, MD;  Location: [facility] ENDOSCOPY;  Service: Cardiovascular;  Laterality: N/A;    There were no vitals filed for this visit.      Subjective Assessment - 09/27/16 0821    Subjective  it's getting better and better a little at a time   Patient is accompained by: Interpreter;Family member  son   Pertinent History L MCA  distribution infarct, aphasia, R hemiparesis, R hand dupuytren's contracture, HTN   Limitations native Spanish speaking, uses interpreter   Patient Stated Goals improve RUE pain and functional use, return to work   Currently in Pain? No/denies   Pain Onset More than a month ago       Checked goals and discussed progress--see below.  Educated pt that will also focus more on coordination, grip strength during renewal period as pt demo compensation patterns/abnormal shoulder positioning.  Pt verbalized understanding.    Ultrasound  Ultrasound Location R anterior shoulder  Ultrasound Parameters 76mz, 1.0wts/cm2, 50% pulsed x8 min with no adverse reactions  Ultrasound Goals Pain     In supine, scapular depression and scapular retraction with min cueing initially.  In supine, shoulder flex and chest press with BUEs with ball  within pain-free range with min cueing/facilitation for proper positioning/scapular stability.    In supine, AAROM shoulder abduction with foam noodle with min cueing for positioning in pain-free range (to approx 80*).  In sitting, AAROM ER with foam noodle in pain-free range with min cueing.  In prone, scapular retraction with min cueing/facilitation for avoid scapular depression.   In prone, wt. Bearing through bilateral elbows with head/chest lift for scapular depression with min cueing and no pain.  In modified quadruped, with forward/backward wt. Shifts for incr scapular stability.                        OT Education - 09/27/16 05364  Education Details Updated HEP--see pt instructions   Person(s) Educated Patient   Methods Explanation;Demonstration;Verbal cues;Handout   Comprehension Verbalized understanding;Returned demonstration;Verbal cues required          OT Short Term Goals - 09/27/16 0906      OT SHORT TERM GOAL #1   Title Pt will be independent with initial HEP.--check STGs 08/30/16   Time 4   Period Weeks    Status Achieved     OT SHORT TERM GOAL #2   Title Pt will report pain less than 6/10 for light BADLs.   Baseline 10/10 with movement   Time 4   Period Weeks   Status Achieved  08/23/16     OT SHORT TERM GOAL #3   Title Pt will demo at least 105* R shoulder flex for light functional reaching.   Baseline 95*   Time 4   Period Weeks   Status Achieved  08/23/16  70* without pain and good positioning.  09/20/16:  110* without pain     OT SHORT TERM GOAL #4   Title Pt will demo at least 50* R shoulder abduction for lateral reaching/ADLs.--check remaining STGs 10/27/16   Baseline 35*   Time 4   Period Weeks   Status On-going  08/23/16  35* without pain and good position.  09/27/16:  40* without pain and good positioning     OT SHORT TERM GOAL #5   Title Pt will verbalize understanding of proper positioning of RUE/adaptions for ADLs to decr RUE pain.   Time 4   Period Weeks   Status Achieved  08/23/16, but continues to need min cueing at times     Additional Short Term Goals   Additional Short Term Goals Yes     OT SHORT TERM GOAL #6   Title Continue with LTG#3 as STG   Time 4   Period Weeks   Status New   Target Date 10/27/16           OT Long Term Goals - 09/27/16 0827      OT LONG TERM GOAL #1   Title Pt will be independent with updated HEP.--check LTGs 11/26/16   Time 8   Period Weeks   Status On-going  09/27/16:  met with current, but will benefit from additional updates     OT LONG TERM GOAL #2   Title Pt will report pain less than 4/10 for light ADLs/IADLs.   Baseline 10/10 with movement   Time 8   Period Weeks   Status Achieved  09/27/16:  2/10 or less     OT LONG TERM GOAL #3   Title Pt will demo at least 120* R shoulder flex for light functional reaching.--continue as STG   Baseline 95*   Time 8   Period Weeks   Status On-going  09/27/16:  115* without pain     OT LONG TERM GOAL #4   Title Pt will demo at least 70* R shoulder abduction for lateral  reaching/ADLs.   Baseline 35*   Time 8   Period Weeks   Status On-going  09/27/16:  40* without pain     OT LONG TERM GOAL #5   Title Pt will improve R grip strength to at least 40lbs for gripping/lifting tasks.   Baseline R-35lbs   Time 8   Period Weeks   Status Revised  09/27/16:  24lbs     OT LONG TERM GOAL #6   Title Pt will improve R hand coordination  for ADLs as shown by completing 9-hole peg test in 30sec or less.   Baseline 37.25sec   Time 8   Period Weeks   Status Revised  09/27/16:  47.97sec with shoulder compensations     OT LONG TERM GOAL #7   Title Pt will be able to lift 2lb object from overhead shelf without pain with RUE.   Baseline unable   Time 8   Period Weeks   Status New               Plan - 09/27/16 6629    Clinical Impression Statement Pt has made good progress with R shoulder pain and ROM and reports improving RUE functional use.  Pt would benefit from continued occupational therapy to maximize RUE functional use, decr pain for improved ADL/IADL performance and eventual return to work.   Rehab Potential Good   OT Frequency 2x / week   OT Duration 8 weeks   OT Treatment/Interventions Self-care/ADL training;Cryotherapy;Parrafin;Therapeutic exercise;DME and/or AE instruction;Functional Mobility Training;Manual Therapy;Neuromuscular education;Splinting;Fluidtherapy;Ultrasound;Electrical Stimulation;Moist Heat;Contrast Bath;Energy conservation;Passive range of motion;Therapeutic exercises;Therapeutic activities;Patient/family education   Plan renewal completed 09/27/16; functional reaching RUE, coordination/grip strength HEP, continue to address R shoulder pain with proper positioning, scapular stability, AROM/AAROM   OT Home Exercise Plan Education provided:  HEP, proper positiong of RUE    Consulted and Agree with Plan of Care Patient      Patient will benefit from skilled therapeutic intervention in order to improve the following deficits and  impairments:  Decreased activity tolerance, Decreased coordination, Decreased knowledge of use of DME, Decreased strength, Impaired UE functional use, Pain, Improper body mechanics, Decreased range of motion, Decreased mobility, Impaired vision/preception, Decreased balance  Visit Diagnosis: Hemiplegia and hemiparesis following cerebral infarction affecting right dominant side (HCC)  Right shoulder pain, unspecified chronicity  Stiffness of right shoulder, not elsewhere classified  Other lack of coordination    Problem List Patient Active Problem List   Diagnosis Date Noted  . Dupuytren's contracture of right hand 02/18/2016  . Gait disturbance, post-stroke 01/04/2016  . Hemiparesis affecting right side as late effect of cerebrovascular accident (CVA) ([place]) 01/04/2016  . Cerebral infarction due to thrombosis of left middle cerebral artery ([place]) 12/15/2015  . Left middle cerebral artery stroke ([place]) 12/15/2015  . Oropharyngeal dysphagia   . Aphasia as late effect of stroke   . Right hemiplegia ([place])   . Acute on chronic respiratory failure ([place])   . Essential hypertension   . CVA (cerebral vascular accident) [facility]) 12/08/2015    [facility] 09/27/2016, 9:33 AM  [facility] [address] [place], [place], [address] Phone: [phone]   Fax:  [phone]  Name: Jaxtin Raimondo MRN: [id number] Date of Birth: Jul 22, 1965   Vianne Bulls, OTR/L [facility] [address] [place], [place]  [address] [phone] phone [phone] 09/27/16 9:33 AM

## 2016-09-27 NOTE — Patient Instructions (Signed)
Scapular Retraction (Prone)   Lie with arms at sides. Pinch shoulder blades together and raise arms a few inches from floor. Repeat 15 times per set. Do 2 sessions per day.   ROM: Abduction - Wand   Holding wand with right  hand palm up, push wand directly out to side, leading with other hand palm down, until stretch is felt.   No pain. Hold 3 seconds. Repeat 10 times per set. Do 2 sessions per day. (Lying down)   ROM: Extension - Wand (Standing)   Stand holding wand behind back. Raise arms as far as possible. Repeat 10 times per set.  Do 2-3 sessions per day.     Active Assistive Shoulder External Rotation    SIT with stick at waist level, right hand on side of stick, other palm down. push forearm out from body with hand palm down, and keep elbows bent and by your side. Hold. Perform 10 reps. 2X DAY    .

## 2016-09-28 LAB — COMPREHENSIVE METABOLIC PANEL
ALBUMIN: 4.3 g/dL (ref 3.5–5.5)
ALK PHOS: 80 IU/L (ref 39–117)
ALT: 29 IU/L (ref 0–44)
AST: 24 IU/L (ref 0–40)
Albumin/Globulin Ratio: 1.7 (ref 1.2–2.2)
BUN / CREAT RATIO: 14 (ref 9–20)
BUN: 11 mg/dL (ref 6–24)
Bilirubin Total: 0.6 mg/dL (ref 0.0–1.2)
CHLORIDE: 107 mmol/L — AB (ref 96–106)
CO2: 22 mmol/L (ref 20–29)
CREATININE: 0.78 mg/dL (ref 0.76–1.27)
Calcium: 9.5 mg/dL (ref 8.7–10.2)
GFR calc Af Amer: 122 mL/min/{1.73_m2} (ref >59)
GFR calc non Af Amer: 105 mL/min/{1.73_m2} (ref >59)
GLUCOSE: 87 mg/dL (ref 65–99)
Globulin, Total: 2.5 g/dL (ref 1.5–4.5)
Potassium: 4.3 mmol/L (ref 3.5–5.2)
Sodium: 144 mmol/L (ref 134–144)
Total Protein: 6.8 g/dL (ref 6.0–8.5)

## 2016-09-28 LAB — LIPID PANEL
CHOLESTEROL TOTAL: 120 mg/dL (ref 100–199)
Chol/HDL Ratio: 3.9 ratio (ref 0.0–5.0)
HDL: 31 mg/dL — AB (ref >39)
LDL CALC: 62 mg/dL (ref 0–99)
TRIGLYCERIDES: 134 mg/dL (ref 0–149)
VLDL CHOLESTEROL CAL: 27 mg/dL (ref 5–40)

## 2016-09-28 LAB — HIV ANTIBODY (ROUTINE TESTING W REFLEX): HIV Screen 4th Generation wRfx: NONREACTIVE

## 2016-09-29 ENCOUNTER — Ambulatory Visit (INDEPENDENT_AMBULATORY_CARE_PROVIDER_SITE_OTHER): Payer: Self-pay | Admitting: Otolaryngology

## 2016-09-29 ENCOUNTER — Telehealth: Payer: Self-pay

## 2016-09-29 ENCOUNTER — Ambulatory Visit: Payer: No Typology Code available for payment source | Admitting: Occupational Therapy

## 2016-09-29 DIAGNOSIS — R490 Dysphonia: Secondary | ICD-10-CM

## 2016-09-29 DIAGNOSIS — M25611 Stiffness of right shoulder, not elsewhere classified: Secondary | ICD-10-CM

## 2016-09-29 DIAGNOSIS — R278 Other lack of coordination: Secondary | ICD-10-CM

## 2016-09-29 DIAGNOSIS — M25511 Pain in right shoulder: Secondary | ICD-10-CM

## 2016-09-29 DIAGNOSIS — I69351 Hemiplegia and hemiparesis following cerebral infarction affecting right dominant side: Secondary | ICD-10-CM

## 2016-09-29 NOTE — Therapy (Signed)
[facility] [address] [place], [place], [address] Phone: [phone]   Fax:  [phone]  Occupational Therapy Treatment  Patient Details  Name: James [place] MRN: [id number] Date of Birth: 1965-04-13 Referring Provider: Dr. Arnoldo Morale  Encounter Date: 09/29/2016      OT End of Session - 09/29/16 0814    Visit Number 18   Number of Visits 33  17+16=33   Date for OT Re-Evaluation 11/26/16   Authorization Type [employer] 100% coverage   Authorization Time Period covered through 10/12/16;  renewal completed 09/27/16 for 8 wks   OT Start Time 0806   OT Stop Time 0845   OT Time Calculation (min) 39 min   Activity Tolerance Patient tolerated treatment well   Behavior During Therapy [facility] for tasks assessed/performed      Past Medical History:  Diagnosis Date  . Skin cancer     Past Surgical History:  Procedure Laterality Date  . EP IMPLANTABLE DEVICE N/A 12/15/2015   Procedure: Loop Recorder Insertion;  Surgeon: Will Meredith Leeds, MD;  Location: [place] CV LAB;  Service: Cardiovascular;  Laterality: N/A;  . IR GENERIC HISTORICAL  12/08/2015   IR PERCUTANEOUS ART THROMBECTOMY/INFUSION INTRACRANIAL INC DIAG ANGIO 12/08/2015 Luanne Bras, MD [facility]-INTER[facility] RAD  . RADIOLOGY WITH ANESTHESIA Right 12/08/2015   Procedure: RADIOLOGY WITH ANESTHESIA - CODE STROKE;  Surgeon: Medication Radiologist, MD;  Location: [place];  Service: Radiology;  Laterality: Right;  . SKIN SURGERY    . TEE WITHOUT CARDIOVERSION N/A 12/15/2015   Procedure: TRANSESOPHAGEAL ECHOCARDIOGRAM (TEE);  Surgeon: Sueanne Margarita, MD;  Location: [facility] ENDOSCOPY;  Service: Cardiovascular;  Laterality: N/A;    There were no vitals filed for this visit.      Subjective Assessment - 09/29/16 0813    Subjective  It's been hurting more at night (up to 6-7/10), but during the day it has been fine   Patient is accompained by: Interpreter;Family member   son   Pertinent History L MCA distribution infarct, aphasia, R hemiparesis, R hand dupuytren's contracture, HTN   Limitations native Spanish speaking, uses interpreter   Patient Stated Goals improve RUE pain and functional use, return to work   Currently in Pain? No/denies   Pain Onset More than a month ago        In supine, scapular depression and scapular retraction with min cueing initially.  In supine, shoulder flex and chest press with BUEs with ball  within pain-free range with min cueing/facilitation for proper positioning/scapular stability.    In supine, AAROM shoulder abduction with foam noodle with min cueing for positioning in pain-free range (to approx 80*).  In sitting, AAROM ER with foam noodle in pain-free range with min cueing.  In prone, scapular retraction with min v.c. for avoid scapular depression.   In modified quadruped, with forward/backward wt. Shifts for incr scapular stability.                          OT Education - 09/29/16 [phone]    Education Details coordination and red putty HEP--see pt instructions   Person(s) Educated Patient  via interpreter, pt reports that children can help him with handout at home prn   Methods Explanation;Handout;Verbal cues;Demonstration   Comprehension Verbalized understanding;Returned demonstration;Verbal cues required  min-mod cueing for shoulder hike initially          OT Short Term Goals - 09/27/16 0906      OT  SHORT TERM GOAL #1   Title Pt will be independent with initial HEP.--check STGs 08/30/16   Time 4   Period Weeks   Status Achieved     OT SHORT TERM GOAL #2   Title Pt will report pain less than 6/10 for light BADLs.   Baseline 10/10 with movement   Time 4   Period Weeks   Status Achieved  08/23/16     OT SHORT TERM GOAL #3   Title Pt will demo at least 105* R shoulder flex for light functional reaching.   Baseline 95*   Time 4   Period Weeks   Status Achieved  08/23/16   70* without pain and good positioning.  09/20/16:  110* without pain     OT SHORT TERM GOAL #4   Title Pt will demo at least 50* R shoulder abduction for lateral reaching/ADLs.--check remaining STGs 10/27/16   Baseline 35*   Time 4   Period Weeks   Status On-going  08/23/16  35* without pain and good position.  09/27/16:  40* without pain and good positioning     OT SHORT TERM GOAL #5   Title Pt will verbalize understanding of proper positioning of RUE/adaptions for ADLs to decr RUE pain.   Time 4   Period Weeks   Status Achieved  08/23/16, but continues to need min cueing at times     Additional Short Term Goals   Additional Short Term Goals Yes     OT SHORT TERM GOAL #6   Title Continue with LTG#3 as STG   Time 4   Period Weeks   Status New   Target Date 10/27/16           OT Long Term Goals - 09/27/16 0827      OT LONG TERM GOAL #1   Title Pt will be independent with updated HEP.--check LTGs 11/26/16   Time 8   Period Weeks   Status On-going  09/27/16:  met with current, but will benefit from additional updates     OT LONG TERM GOAL #2   Title Pt will report pain less than 4/10 for light ADLs/IADLs.   Baseline 10/10 with movement   Time 8   Period Weeks   Status Achieved  09/27/16:  2/10 or less     OT LONG TERM GOAL #3   Title Pt will demo at least 120* R shoulder flex for light functional reaching.--continue as STG   Baseline 95*   Time 8   Period Weeks   Status On-going  09/27/16:  115* without pain     OT LONG TERM GOAL #4   Title Pt will demo at least 70* R shoulder abduction for lateral reaching/ADLs.   Baseline 35*   Time 8   Period Weeks   Status On-going  09/27/16:  40* without pain     OT LONG TERM GOAL #5   Title Pt will improve R grip strength to at least 40lbs for gripping/lifting tasks.   Baseline R-35lbs   Time 8   Period Weeks   Status Revised  09/27/16:  24lbs     OT LONG TERM GOAL #6   Title Pt will improve R hand coordination for  ADLs as shown by completing 9-hole peg test in 30sec or less.   Baseline 37.25sec   Time 8   Period Weeks   Status Revised  09/27/16:  47.97sec with shoulder compensations     OT LONG TERM GOAL #7  Title Pt will be able to lift 2lb object from overhead shelf without pain with RUE.   Baseline unable   Time 8   Period Weeks   Status New               Plan - 09/29/16 2876    Clinical Impression Statement Pt continues to progress with RUE functional use and shoulder pain for ADLs.  Pt does report incr pain at night past few days, but not during the day with activity.  Pt needs reinforcement for shoulder positioning with RUE functional use now that pt is moving RUE more.   Rehab Potential Good   OT Frequency 2x / week   OT Duration 8 weeks   OT Treatment/Interventions Self-care/ADL training;Cryotherapy;Parrafin;Therapeutic exercise;DME and/or AE instruction;Functional Mobility Training;Manual Therapy;Neuromuscular education;Splinting;Fluidtherapy;Ultrasound;Electrical Stimulation;Moist Heat;Contrast Bath;Energy conservation;Passive range of motion;Therapeutic exercises;Therapeutic activities;Patient/family education   Plan continue with AROM/AAROM R shoulder, scapular stability, functional reaching with proper positioning   OT Home Exercise Plan Education provided:  HEP, proper positiong of RUE    Consulted and Agree with Plan of Care Patient      Patient will benefit from skilled therapeutic intervention in order to improve the following deficits and impairments:  Decreased activity tolerance, Decreased coordination, Decreased knowledge of use of DME, Decreased strength, Impaired UE functional use, Pain, Improper body mechanics, Decreased range of motion, Decreased mobility, Impaired vision/preception, Decreased balance  Visit Diagnosis: Hemiplegia and hemiparesis following cerebral infarction affecting right dominant side (HCC)  Right shoulder pain, unspecified  chronicity  Stiffness of right shoulder, not elsewhere classified  Other lack of coordination    Problem List Patient Active Problem List   Diagnosis Date Noted  . Dupuytren's contracture of right hand 02/18/2016  . Gait disturbance, post-stroke 01/04/2016  . Hemiparesis affecting right side as late effect of cerebrovascular accident (CVA) ([place]) 01/04/2016  . Cerebral infarction due to thrombosis of left middle cerebral artery ([place]) 12/15/2015  . Left middle cerebral artery stroke ([place]) 12/15/2015  . Oropharyngeal dysphagia   . Aphasia as late effect of stroke   . Right hemiplegia ([place])   . Acute on chronic respiratory failure ([place])   . Essential hypertension   . CVA (cerebral vascular accident) [facility]) 12/08/2015    [facility] 09/29/2016, 10:39 AM  [place] [address] [place], [place], [address] Phone: [phone]   Fax:  [phone]  Name: James [place] MRN: [id number] Date of Birth: 11/22/1965   Vianne Bulls, OTR/L [facility] [address] [place], [place]  [address] [phone] phone [phone] 09/29/16 10:39 AM

## 2016-09-29 NOTE — Telephone Encounter (Signed)
Pt was called and informed of lab results. 

## 2016-09-29 NOTE — Patient Instructions (Addendum)
  Coordination Activities  Perform the following activities for 15-20 minutes 1 times per day with right hand(s).  Keep elbow/soulder down.   Rotate ball in fingertips (clockwise and counter-clockwise).  Flip cards 1 at a time as fast as you can.  Deal cards with your thumb (Hold deck in hand and push card off top with thumb).  Pick up coins and stack.  Pick up coins one at a time until you get 5-10 in your hand, then move coins from palm to fingertips to put in container/coin bank one at a time.  Keep arm on table and squeeze red putty 20x with right hand.  Roll out putty and then pinch with each finger and thumb down length of putty.  Repeat 3 times

## 2016-10-04 ENCOUNTER — Ambulatory Visit: Payer: No Typology Code available for payment source | Admitting: Occupational Therapy

## 2016-10-04 DIAGNOSIS — I69351 Hemiplegia and hemiparesis following cerebral infarction affecting right dominant side: Secondary | ICD-10-CM

## 2016-10-04 DIAGNOSIS — M25511 Pain in right shoulder: Secondary | ICD-10-CM

## 2016-10-04 DIAGNOSIS — R278 Other lack of coordination: Secondary | ICD-10-CM

## 2016-10-04 DIAGNOSIS — M25611 Stiffness of right shoulder, not elsewhere classified: Secondary | ICD-10-CM

## 2016-10-04 NOTE — Therapy (Signed)
[facility] [address] [place], [place], [address] Phone: [phone]   Fax:  [phone]  Occupational Therapy Treatment  Patient Details  Name: James Cortez MRN: [id number] Date of Birth: 06-Jan-1966 Referring Provider: Dr. Arnoldo Morale  Encounter Date: 10/04/2016      OT End of Session - 10/04/16 0844    Visit Number 19   Number of Visits 33   Date for OT Re-Evaluation 11/26/16   Authorization Type GCCN 100% coverage   Authorization Time Period covered through 10/12/16;  renewal completed 09/27/16 for 8 wks   OT Start Time 0805   OT Stop Time 0845   OT Time Calculation (min) 40 min   Activity Tolerance Patient tolerated treatment well   Behavior During Therapy [facility] for tasks assessed/performed      Past Medical History:  Diagnosis Date  . Skin cancer     Past Surgical History:  Procedure Laterality Date  . EP IMPLANTABLE DEVICE N/A 12/15/2015   Procedure: Loop Recorder Insertion;  Surgeon: Will Meredith Leeds, MD;  Location: [facility];  Service: Cardiovascular;  Laterality: N/A;  . IR GENERIC HISTORICAL  12/08/2015   IR PERCUTANEOUS ART THROMBECTOMY/INFUSION INTRACRANIAL INC DIAG ANGIO 12/08/2015 Luanne Bras, MD [facility]-INTERV RAD  . RADIOLOGY WITH ANESTHESIA Right 12/08/2015   Procedure: RADIOLOGY WITH ANESTHESIA - CODE STROKE;  Surgeon: Medication Radiologist, MD;  Location: [facility];  Service: Radiology;  Laterality: Right;  . SKIN SURGERY    . TEE WITHOUT CARDIOVERSION N/A 12/15/2015   Procedure: TRANSESOPHAGEAL ECHOCARDIOGRAM (TEE);  Surgeon: Sueanne Margarita, MD;  Location: [facility] END[facility];  Service: Cardiovascular;  Laterality: N/A;    There were no vitals filed for this visit.      Subjective Assessment - 10/04/16 0806    Pertinent History L MCA distribution infarct, aphasia, R hemiparesis, R hand dupuytren's contracture, HTN   Patient Stated Goals improve RUE pain and functional  use, return to work   Currently in Pain? Yes   Pain Score 5    Pain Location Shoulder   Pain Orientation Right   Pain Descriptors / Indicators Aching   Pain Type Chronic pain   Pain Onset More than a month ago   Pain Frequency Intermittent   Aggravating Factors  overhead   Pain Relieving Factors repositioning             In supine, scapular depression and scapular retraction with min cueing initially.  In supine, shoulder flex and chest press with BUEs with ball  within pain-free range with min cueing/facilitation for proper positioning/scapular stability.    In supine, AAROM shoulder abduction with foam noodle with min cueing for positioning in pain-free range (to approx 80*).  In sitting, AAROM ER with foam noodle in pain-free range with min cueing.  In prone, scapular retraction with min v.c.   In modified quadruped, with forward/backward wt. Shifts for incr scapular stability.  Seated at table fine motor coordination activities: flipping cards, stacking coins, placing grooved pegs with RUE, while ice pack on right shoulder x 8 mins for pain relief, no adverse reactions.                   OT Short Term Goals - 09/27/16 0906      OT SHORT TERM GOAL #1   Title Pt will be independent with initial HEP.--check STGs 08/30/16   Time 4   Period Weeks   Status Achieved     OT SHORT TERM  GOAL #2   Title Pt will report pain less than 6/10 for light BADLs.   Baseline 10/10 with movement   Time 4   Period Weeks   Status Achieved  08/23/16     OT SHORT TERM GOAL #3   Title Pt will demo at least 105* R shoulder flex for light functional reaching.   Baseline 95*   Time 4   Period Weeks   Status Achieved  08/23/16  70* without pain and good positioning.  09/20/16:  110* without pain     OT SHORT TERM GOAL #4   Title Pt will demo at least 50* R shoulder abduction for lateral reaching/ADLs.--check remaining STGs 10/27/16   Baseline 35*   Time 4   Period  Weeks   Status On-going  08/23/16  35* without pain and good position.  09/27/16:  40* without pain and good positioning     OT SHORT TERM GOAL #5   Title Pt will verbalize understanding of proper positioning of RUE/adaptions for ADLs to decr RUE pain.   Time 4   Period Weeks   Status Achieved  08/23/16, but continues to need min cueing at times     Additional Short Term Goals   Additional Short Term Goals Yes     OT SHORT TERM GOAL #6   Title Continue with LTG#3 as STG   Time 4   Period Weeks   Status New   Target Date 10/27/16           OT Long Term Goals - 09/27/16 0827      OT LONG TERM GOAL #1   Title Pt will be independent with updated HEP.--check LTGs 11/26/16   Time 8   Period Weeks   Status On-going  09/27/16:  met with current, but will benefit from additional updates     OT LONG TERM GOAL #2   Title Pt will report pain less than 4/10 for light ADLs/IADLs.   Baseline 10/10 with movement   Time 8   Period Weeks   Status Achieved  09/27/16:  2/10 or less     OT LONG TERM GOAL #3   Title Pt will demo at least 120* R shoulder flex for light functional reaching.--continue as STG   Baseline 95*   Time 8   Period Weeks   Status On-going  09/27/16:  115* without pain     OT LONG TERM GOAL #4   Title Pt will demo at least 70* R shoulder abduction for lateral reaching/ADLs.   Baseline 35*   Time 8   Period Weeks   Status On-going  09/27/16:  40* without pain     OT LONG TERM GOAL #5   Title Pt will improve R grip strength to at least 40lbs for gripping/lifting tasks.   Baseline R-35lbs   Time 8   Period Weeks   Status Revised  09/27/16:  24lbs     OT LONG TERM GOAL #6   Title Pt will improve R hand coordination for ADLs as shown by completing 9-hole peg test in 30sec or less.   Baseline 37.25sec   Time 8   Period Weeks   Status Revised  09/27/16:  47.97sec with shoulder compensations     OT LONG TERM GOAL #7   Title Pt will be able to lift 2lb object  from overhead shelf without pain with RUE.   Baseline unable   Time 8   Period Weeks   Status New  Patient will benefit from skilled therapeutic intervention in order to improve the following deficits and impairments:     Visit Diagnosis: Hemiplegia and hemiparesis following cerebral infarction affecting right dominant side (HCC)  Right shoulder pain, unspecified chronicity  Stiffness of right shoulder, not elsewhere classified  Other lack of coordination    Problem List Patient Active Problem List   Diagnosis Date Noted  . Dupuytren's contracture of right hand 02/18/2016  . Gait disturbance, post-stroke 01/04/2016  . Hemiparesis affecting right side as late effect of cerebrovascular accident (CVA) ([place]) 01/04/2016  . Cerebral infarction due to thrombosis of left middle cerebral artery ([place]) 12/15/2015  . Left middle cerebral artery stroke ([place]) 12/15/2015  . Oropharyngeal dysphagia   . Aphasia as late effect of stroke   . Right hemiplegia ([place])   . Acute on chronic respiratory failure ([place])   . Essential hypertension   . CVA (cerebral vascular accident) ([place]) 12/08/2015    James Cortez 10/04/2016, 8:44 AM Theone Murdoch, OTR/L [phone] Phone: [phone] 1:08 PM 10/04/16 [facility] [address] [place], [place], [address] Phone: [phone]   Fax:  [phone]  Name: James Cortez MRN: [id number] Date of Birth: June 01, 1965

## 2016-10-06 ENCOUNTER — Ambulatory Visit: Payer: No Typology Code available for payment source | Admitting: Occupational Therapy

## 2016-10-06 DIAGNOSIS — R278 Other lack of coordination: Secondary | ICD-10-CM

## 2016-10-06 DIAGNOSIS — M25511 Pain in right shoulder: Secondary | ICD-10-CM

## 2016-10-06 DIAGNOSIS — M25611 Stiffness of right shoulder, not elsewhere classified: Secondary | ICD-10-CM

## 2016-10-06 DIAGNOSIS — I69351 Hemiplegia and hemiparesis following cerebral infarction affecting right dominant side: Secondary | ICD-10-CM

## 2016-10-06 NOTE — Therapy (Signed)
[facility] [address] [place], [place], [address] Phone: [phone]   Fax:  [phone]  Occupational Therapy Treatment  Patient Details  Name: James Cortez MRN: [id number] Date of Birth: 04/08/1965 Referring Provider: Dr. Arnoldo Morale  Encounter Date: 10/06/2016      OT End of Session - 10/06/16 0856    Visit Number 20   Number of Visits 33   Date for OT Re-Evaluation 11/26/16   Authorization Type GCC[employer] 100% coverage   Authorization Time Period covered through 10/12/16;  renewal completed 09/27/16 for 8 wks   OT Start Time 0850   OT Stop Time 0930   OT Time Calculation (min) 40 min   Activity Tolerance Patient tolerated treatment well   Behavior During Therapy [facility] for tasks assessed/performed      Past Medical History:  Diagnosis Date  . Skin cancer     Past Surgical History:  Procedure Laterality Date  . EP IMPLANTABLE DEVICE N/A 12/15/2015   Procedure: Loop Recorder Insertion;  Surgeon: Will Meredith Leeds, MD;  Location: [facility];  Service: Cardiovascular;  Laterality: N/A;  . IR GENERIC HISTORICAL  12/08/2015   IR PERCUTANEOUS ART THROMBECTOMY/INFUSION INTRACRANIAL INC DIAG ANGIO 12/08/2015 Luanne Bras, MD [facility]INTER[facility] RAD  . RADIOLOGY WITH ANESTHESIA Right 12/08/2015   Procedure: RADIOLOGY WITH ANESTHESIA - CODE STROKE;  Surgeon: Medication Radiologist, MD;  Location: [facility];  Service: Radiology;  Laterality: Right;  . SKIN SURGERY    . TEE WITHOUT CARDIOVERSION N/A 12/15/2015   Procedure: TRANSESOPHAGEAL ECHOCARDIOGRAM (TEE);  Surgeon: Sueanne Margarita, MD;  Location[facility];  Service: Cardiovascular;  Laterality: N/A;    There were no vitals filed for this visit.      Subjective Assessment - 10/06/16 0854    Subjective  It's been hurting more at night (up to 6-7/10), but during the day it has been fine   Patient is accompained by: Interpreter   Pertinent History L  MCA distribution infarct, aphasia, R hemiparesis, R hand dupuytren's contracture, HTN   Limitations native Spanish speaking, uses interpreter   Patient Stated Goals improve RUE pain and functional use, return to work   Currently in Pain? Yes   Pain Score 1    Pain Location Shoulder   Pain Orientation Right   Pain Descriptors / Indicators Aching   Pain Type Chronic pain   Pain Onset More than a month ago   Pain Frequency Intermittent   Aggravating Factors  slept on stomach my accident and hurt when he rolled over   Pain Relieving Factors proper positioning       In supine, scapular depression and scapular retraction.  In supine, shoulder flex and chest press with BUEs with ball  within pain-free range with min cueing/facilitation for proper positioning/scapular stability.    In supine, AAROM shoulder abduction with foam noodle with min cueing for positioning in pain-free range (to approx 80*).  In prone, scapular retraction with min v.c. for avoid scapular depression.   In modified quadruped, with forward/backward wt. Shifts for incr scapular stability.  In prone, modified plank position for incr scapular stability with min cueing for positioning.  In standing, AAROM shoulder flex with hemi-glide with min-mod cueing/facilitation for scapular facilitation/gliding.   In standing, low-mid range functional reaching to place/remove clothespins with 1-8lb resistance with min cueing for proper positioning (without pain).  Arm bike x2mn level 1 for reciprocal movement (forward/backwards) without pain/rest.  OT Education - 10/06/16 [phone]    Education Details Pt instructed in how to gently stretch R 2nd index finger x5 reps, 4x/day   Person(s) Educated Patient   Methods Explanation;Demonstration   Comprehension Verbalized understanding;Returned demonstration          OT Short Term Goals - 09/27/16 0906      OT  SHORT TERM GOAL #1   Title Pt will be independent with initial HEP.--check STGs 08/30/16   Time 4   Period Weeks   Status Achieved     OT SHORT TERM GOAL #2   Title Pt will report pain less than 6/10 for light BADLs.   Baseline 10/10 with movement   Time 4   Period Weeks   Status Achieved  08/23/16     OT SHORT TERM GOAL #3   Title Pt will demo at least 105* R shoulder flex for light functional reaching.   Baseline 95*   Time 4   Period Weeks   Status Achieved  08/23/16  70* without pain and good positioning.  09/20/16:  110* without pain     OT SHORT TERM GOAL #4   Title Pt will demo at least 50* R shoulder abduction for lateral reaching/ADLs.--check remaining STGs 10/27/16   Baseline 35*   Time 4   Period Weeks   Status On-going  08/23/16  35* without pain and good position.  09/27/16:  40* without pain and good positioning     OT SHORT TERM GOAL #5   Title Pt will verbalize understanding of proper positioning of RUE/adaptions for ADLs to decr RUE pain.   Time 4   Period Weeks   Status Achieved  08/23/16, but continues to need min cueing at times     Additional Short Term Goals   Additional Short Term Goals Yes     OT SHORT TERM GOAL #6   Title Continue with LTG#3 as STG   Time 4   Period Weeks   Status New   Target Date 10/27/16           OT Long Term Goals - 09/27/16 0827      OT LONG TERM GOAL #1   Title Pt will be independent with updated HEP.--check LTGs 11/26/16   Time 8   Period Weeks   Status On-going  09/27/16:  met with current, but will benefit from additional updates     OT LONG TERM GOAL #2   Title Pt will report pain less than 4/10 for light ADLs/IADLs.   Baseline 10/10 with movement   Time 8   Period Weeks   Status Achieved  09/27/16:  2/10 or less     OT LONG TERM GOAL #3   Title Pt will demo at least 120* R shoulder flex for light functional reaching.--continue as STG   Baseline 95*   Time 8   Period Weeks   Status On-going  09/27/16:   115* without pain     OT LONG TERM GOAL #4   Title Pt will demo at least 70* R shoulder abduction for lateral reaching/ADLs.   Baseline 35*   Time 8   Period Weeks   Status On-going  09/27/16:  40* without pain     OT LONG TERM GOAL #5   Title Pt will improve R grip strength to at least 40lbs for gripping/lifting tasks.   Baseline R-35lbs   Time 8   Period Weeks   Status Revised  09/27/16:  24lbs  OT LONG TERM GOAL #6   Title Pt will improve R hand coordination for ADLs as shown by completing 9-hole peg test in 30sec or less.   Baseline 37.25sec   Time 8   Period Weeks   Status Revised  09/27/16:  47.97sec with shoulder compensations     OT LONG TERM GOAL #7   Title Pt will be able to lift 2lb object from overhead shelf without pain with RUE.   Baseline unable   Time 8   Period Weeks   Status New               Plan - 10/06/16 2482    Clinical Impression Statement Pt is progressing towards goals with improving RUE functional use and reach.  However, pt continues to have pain with improper positioning and is limited in R 2nd digit flex due to pain.   Rehab Potential Good   OT Frequency 2x / week   OT Duration 8 weeks   OT Treatment/Interventions Self-care/ADL training;Cryotherapy;Parrafin;Therapeutic exercise;DME and/or AE instruction;Functional Mobility Training;Manual Therapy;Neuromuscular education;Splinting;Fluidtherapy;Ultrasound;Electrical Stimulation;Moist Heat;Contrast Bath;Energy conservation;Passive range of motion;Therapeutic exercises;Therapeutic activities;Patient/family education   Plan schedule more appts (may need to hold if haven't received updated orange card), functional reach, ROM   OT Home Exercise Plan Education provided:  HEP, proper positiong of RUE    Consulted and Agree with Plan of Care Patient      Patient will benefit from skilled therapeutic intervention in order to improve the following deficits and impairments:  Decreased activity  tolerance, Decreased coordination, Decreased knowledge of use of DME, Decreased strength, Impaired UE functional use, Pain, Improper body mechanics, Decreased range of motion, Decreased mobility, Impaired vision/preception, Decreased balance  Visit Diagnosis: Hemiplegia and hemiparesis following cerebral infarction affecting right dominant side (HCC)  Right shoulder pain, unspecified chronicity  Stiffness of right shoulder, not elsewhere classified  Other lack of coordination    Problem List Patient Active Problem List   Diagnosis Date Noted  . Dupuytren's contracture of right hand 02/18/2016  . Gait disturbance, post-stroke 01/04/2016  . Hemiparesis affecting right side as late effect of cerebrovascular accident (CVA) ([place]) 01/04/2016  . Cerebral infarction due to thrombosis of left middle cerebral artery ([name]) 12/15/2015  . Left middle cerebral artery stroke ([place]) 12/15/2015  . Oropharyngeal dysphagia   . Aphasia as late effect of stroke   . Right hemiplegia ([place])   . Acute on chronic respiratory failure ([place])   . Essential hypertension   . CVA (cerebral vascular accident) ([place]) 12/08/2015    [facility] 10/06/2016, 1:04 PM  [place] [address] [place], [place], [address] Phone: [phone]   Fax:  [phone]  Name: James Cortez MRN: [id number] Date of Birth: November 13, 1965   Vianne Bulls, OTR/L [facility] [address] [place], [place]  [address] [phone] phone [phone] 10/06/16 1:04 PM

## 2016-10-11 ENCOUNTER — Encounter: Payer: Self-pay | Attending: Physical Medicine & Rehabilitation

## 2016-10-11 ENCOUNTER — Ambulatory Visit (INDEPENDENT_AMBULATORY_CARE_PROVIDER_SITE_OTHER): Payer: Self-pay | Admitting: *Deleted

## 2016-10-11 ENCOUNTER — Encounter: Payer: Self-pay | Admitting: Physical Medicine & Rehabilitation

## 2016-10-11 ENCOUNTER — Ambulatory Visit (HOSPITAL_BASED_OUTPATIENT_CLINIC_OR_DEPARTMENT_OTHER): Payer: No Typology Code available for payment source | Admitting: Physical Medicine & Rehabilitation

## 2016-10-11 DIAGNOSIS — I69351 Hemiplegia and hemiparesis following cerebral infarction affecting right dominant side: Secondary | ICD-10-CM | POA: Insufficient documentation

## 2016-10-11 DIAGNOSIS — M25511 Pain in right shoulder: Secondary | ICD-10-CM | POA: Insufficient documentation

## 2016-10-11 DIAGNOSIS — M25811 Other specified joint disorders, right shoulder: Secondary | ICD-10-CM | POA: Insufficient documentation

## 2016-10-11 DIAGNOSIS — I639 Cerebral infarction, unspecified: Secondary | ICD-10-CM

## 2016-10-11 DIAGNOSIS — M7502 Adhesive capsulitis of left shoulder: Secondary | ICD-10-CM

## 2016-10-11 DIAGNOSIS — Z85828 Personal history of other malignant neoplasm of skin: Secondary | ICD-10-CM | POA: Insufficient documentation

## 2016-10-11 DIAGNOSIS — Z87891 Personal history of nicotine dependence: Secondary | ICD-10-CM | POA: Insufficient documentation

## 2016-10-11 NOTE — Progress Notes (Signed)
Shoulder injection Right ultrasound guidance)  Indication:Right Shoulder pain not relieved by medication management and other conservative care.  Informed consent was obtained after describing risks and benefits of the procedure with the patient, this includes bleeding, bruising, infection and medication side effects. The patient wishes to proceed and has given written consent. Patient was placed in a seated position. The Right shoulder was marked and prepped with betadine in the subacromial area. A 25-gauge 1-1/2 inch needle was inserted into the subacromial area. After negative draw back for blood, a solution containing 1 mL of 6 mg per ML betamethasone and 4 mL of 1% lidocaine was injected. A band aid was applied. The patient tolerated the procedure well. Post procedure instructions were given.

## 2016-10-11 NOTE — Patient Instructions (Signed)
Capsulitis adhesiva  (Adhesive Capsulitis)  La capsulitis adhesiva es la inflamacin de los tendones y los ligamentos que rodean la articulacin del hombro (cpsula del hombro). Esta afeccin causa rigidez en el hombro y dolor al moverlo. A la capsulitis adhesiva tambin se la conoce como hombro congelado.  CAUSAS  Esta afeccin puede ser causada por lo siguiente:   Una lesin en la articulacin del hombro.   Un esfuerzo con el hombro.   No mover el hombro durante un tiempo. Esto puede ocurrir si la persona sufri una lesin en el brazo o si tuvo el brazo en un cabestrillo.   Problemas de salud prolongados, por ejemplo:  ? Diabetes.  ? Problemas de tiroides.  ? Cardiopata.  ? Ictus.  ? Artritis reumatoide.  ? Enfermedad pulmonar.  En algunos casos, es posible que la causa no se conozca.  FACTORES DE RIESGO  Es ms probable que esta afeccin se manifieste en:   Las mujeres.   Las personas mayores de 40aos.  SNTOMAS  Los sntomas de esta afeccin incluyen lo siguiente:   Dolor en el hombro al mover el brazo. El dolor tambin puede presentarse al tocar partes del hombro. El dolor es ms intenso durante la noche o cuando descansa.   Molestia o dolor en el hombro.   Imposibilidad de mover el hombro normalmente.   Espasmos musculares.  DIAGNSTICO  Esta afeccin se diagnostica mediante un examen fsico y estudios de diagnstico por imgenes, como una radiografa o una resonancia magntica (RM).  TRATAMIENTO  El tratamiento de esta afeccin puede incluir lo siguiente:   Tratamiento de la causa o la afeccin preexistentes.   Fisioterapia. Incluye ejercicios para recuperar el movimiento del hombro.   Medicamentos. Se pueden administrar medicamentos para aliviar el dolor y reducir la inflamacin o los espasmos musculares.   Inyecciones de corticoides en la articulacin del hombro.   Manipulacin del hombro. Este es un procedimiento que se realiza para cambiar la posicin del hombro. Para este procedimiento  le administrarn un medicamento para hacerlo dormir (anestesia general). Tambin pueden inyectarle en la articulacin agua salada (solucin salina) a alta presin para romper las adherencias.   Ciruga. Se puede realizar en los casos graves cuando otros tratamientos no han sido eficaces.  Aunque la mayora de las personas se recuperan totalmente de la capsulitis adhesiva, es posible que algunas no recuperen la movilidad total del hombro.  INSTRUCCIONES PARA EL CUIDADO EN EL HOGAR   Tome los medicamentos de venta libre y los recetados solamente como se lo haya indicado el mdico.   Si el tratamiento incluye la fisioterapia, siga las indicaciones del fisioterapeuta.   Evite los ejercicios que sobreexigen el hombro, por ejemplo, los lanzamientos. Estos ejercicios pueden intensificar el dolor.   Si se lo indican, aplique hielo sobre la zona lesionada:  ? Ponga el hielo en una bolsa plstica.  ? Coloque una toalla entre la piel y la bolsa de hielo.  ? Coloque el hielo durante 20minutos, 2 a 3veces por da.  SOLICITE ATENCIN MDICA SI:   Presenta nuevos sntomas.   Los sntomas empeoran.  Esta informacin no tiene como fin reemplazar el consejo del mdico. Asegrese de hacerle al mdico cualquier pregunta que tenga.  Document Released: 05/21/2012 Document Revised: 10/15/2014 Document Reviewed: 05/19/2014  Elsevier Interactive Patient Education  2018 [employer]

## 2016-10-12 ENCOUNTER — Ambulatory Visit: Payer: No Typology Code available for payment source | Attending: Family Medicine | Admitting: Occupational Therapy

## 2016-10-12 DIAGNOSIS — R278 Other lack of coordination: Secondary | ICD-10-CM | POA: Insufficient documentation

## 2016-10-12 DIAGNOSIS — M25611 Stiffness of right shoulder, not elsewhere classified: Secondary | ICD-10-CM | POA: Insufficient documentation

## 2016-10-12 DIAGNOSIS — M25511 Pain in right shoulder: Secondary | ICD-10-CM

## 2016-10-12 DIAGNOSIS — I69351 Hemiplegia and hemiparesis following cerebral infarction affecting right dominant side: Secondary | ICD-10-CM | POA: Insufficient documentation

## 2016-10-12 NOTE — Therapy (Signed)
[facility] [address] [place], [place], [address] Phone: [phone]   Fax:  [phone]  Occupational Therapy Treatment  Patient Details  Name: James Cortez MRN: [id number] Date of Birth: [birth year] Referring Provider: Dr. Arnoldo Morale  Encounter Date: 10/12/2016      OT End of Session - 10/12/16 1126    Visit Number 21   Number of Visits 33   Date for OT Re-Evaluation 11/26/16   Authorization Type [employer] 100% coverage   Authorization Time Period covered through 10/12/16;  renewal completed 09/27/16 for 8 wks   OT Start Time 0805   OT Stop Time 0845   OT Time Calculation (min) 40 min   Activity Tolerance Patient tolerated treatment well   Behavior During Therapy [facility] for tasks assessed/performed      Past Medical History:  Diagnosis Date  . Skin cancer     Past Surgical History:  Procedure Laterality Date  . EP IMPLANTABLE DEVICE N/A 12/15/2015   Procedure: Loop Recorder Insertion;  Surgeon: Will Meredith Leeds, MD;  Location: [place] [facility];  Service: Cardiovascular;  Laterality: N/A;  . IR GENERIC HISTORICAL  12/08/2015   IR PERCUTANEOUS ART THROMBECTOMY/INFUSION INTRACRANIAL INC DIAG ANGIO 12/08/2015 Luanne Bras, MD [facility]INTER[facility] RAD  . RADIOLOGY WITH ANESTHESIA Right 12/08/2015   Procedure: RADIOLOGY WITH ANESTHESIA - CODE STROKE;  Surgeon: Medication Radiologist, MD;  Location: [place];  Service: Radiology;  Laterality: Right;  . SKIN SURGERY    . TEE WITHOUT CARDIOVERSION N/A 12/15/2015   Procedure: TRANSESOPHAGEAL ECHOCARDIOGRAM (TEE);  Surgeon: Sueanne Margarita, MD;  Location: [facility] ENDOSCOPY;  Service: Cardiovascular;  Laterality: N/A;    There were no vitals filed for this visit.      Subjective Assessment - 10/12/16 0818    Subjective  Pt reports injection to shoulder yesterday   Pertinent History L MCA distribution infarct, aphasia, R hemiparesis, R hand dupuytren's contracture, HTN    Patient Stated Goals improve RUE pain and functional use, return to work   Currently in Pain? Yes   Pain Score 6    Pain Location Shoulder   Pain Orientation Right   Pain Descriptors / Indicators Aching   Pain Type Chronic pain   Pain Onset More than a month ago   Pain Frequency Intermittent   Aggravating Factors  injection   Pain Relieving Factors repositioning             Treatment:In supine, scapular depression and scapular retraction.  In supine, shoulder flex and chest press with BUEs with ball  within pain-free range with min cueing/facilitation for proper positioning/scapular stability.    In seated,  AAROM shoulder abduction with foam noodle with min cueing for positioning in pain-free range (to approx 80*).  In quadruped, with forward/backward wt. Shifts for incr scapular stability  In standing, AAROM shoulder flex with hemi-glide with min-mod cueing/facilitation for scapular facilitation/gliding.   Then closed chain bilateral shoulder flexion 2 sets of 10 reps with medium ball, min faciliation/ v.c  In standing, low-mid range functional reaching to place/remove clothespins with 1-8lb resistance with min cueing for proper positioning (without pain).  Mid to high range reaching with RUE to place remove items from cabinets, min v.c for proper positioning.  Arm bike x 35mn level 3 for reciprocal movement (forward/backwards) without pain/rest.                        OT Short Term Goals - 09/27/16  0906      OT SHORT TERM GOAL #1   Title Pt will be independent with initial HEP.--check STGs 08/30/16   Time 4   Period Weeks   Status Achieved     OT SHORT TERM GOAL #2   Title Pt will report pain less than 6/10 for light BADLs.   Baseline 10/10 with movement   Time 4   Period Weeks   Status Achieved  08/23/16     OT SHORT TERM GOAL #3   Title Pt will demo at least 105* R shoulder flex for light functional reaching.   Baseline 95*   Time  4   Period Weeks   Status Achieved  08/23/16  70* without pain and good positioning.  09/20/16:  110* without pain     OT SHORT TERM GOAL #4   Title Pt will demo at least 50* R shoulder abduction for lateral reaching/ADLs.--check remaining STGs 10/27/16   Baseline 35*   Time 4   Period Weeks   Status On-going  08/23/16  35* without pain and good position.  09/27/16:  40* without pain and good positioning     OT SHORT TERM GOAL #5   Title Pt will verbalize understanding of proper positioning of RUE/adaptions for ADLs to decr RUE pain.   Time 4   Period Weeks   Status Achieved  08/23/16, but continues to need min cueing at times     Additional Short Term Goals   Additional Short Term Goals Yes     OT SHORT TERM GOAL #6   Title Continue with LTG#3 as STG   Time 4   Period Weeks   Status New   Target Date 10/27/16           OT Long Term Goals - 09/27/16 0827      OT LONG TERM GOAL #1   Title Pt will be independent with updated HEP.--check LTGs 11/26/16   Time 8   Period Weeks   Status On-going  09/27/16:  met with current, but will benefit from additional updates     OT LONG TERM GOAL #2   Title Pt will report pain less than 4/10 for light ADLs/IADLs.   Baseline 10/10 with movement   Time 8   Period Weeks   Status Achieved  09/27/16:  2/10 or less     OT LONG TERM GOAL #3   Title Pt will demo at least 120* R shoulder flex for light functional reaching.--continue as STG   Baseline 95*   Time 8   Period Weeks   Status On-going  09/27/16:  115* without pain     OT LONG TERM GOAL #4   Title Pt will demo at least 70* R shoulder abduction for lateral reaching/ADLs.   Baseline 35*   Time 8   Period Weeks   Status On-going  09/27/16:  40* without pain     OT LONG TERM GOAL #5   Title Pt will improve R grip strength to at least 40lbs for gripping/lifting tasks.   Baseline R-35lbs   Time 8   Period Weeks   Status Revised  09/27/16:  24lbs     OT LONG TERM GOAL #6    Title Pt will improve R hand coordination for ADLs as shown by completing 9-hole peg test in 30sec or less.   Baseline 37.25sec   Time 8   Period Weeks   Status Revised  09/27/16:  47.97sec with shoulder compensations  OT LONG TERM GOAL #7   Title Pt will be able to lift 2lb object from overhead shelf without pain with RUE.   Baseline unable   Time 8   Period Weeks   Status New               Plan - 10/12/16 1127    Clinical Impression Statement Pt is progressing towards goals, however he had increased pain today as he received an injection in his shoulder by Dr. Letta Pate yesterday. Pt reports he has not received his organge card yet. He is reapplying Friday.   Rehab Potential Good   OT Frequency 2x / week   OT Duration 8 weeks   OT Treatment/Interventions Self-care/ADL training;Cryotherapy;Parrafin;Therapeutic exercise;DME and/or AE instruction;Functional Mobility Training;Manual Therapy;Neuromuscular education;Splinting;Fluidtherapy;Ultrasound;Electrical Stimulation;Moist Heat;Contrast Bath;Energy conservation;Passive range of motion;Therapeutic exercises;Therapeutic activities;Patient/family education   Plan Place pt on hold x 2 weeks awaiting organge card approval, renew when pt returns   OT Home Exercise Plan Education provided:  HEP, proper positiong of RUE    Consulted and Agree with Plan of Care Patient      Patient will benefit from skilled therapeutic intervention in order to improve the following deficits and impairments:     Visit Diagnosis: Hemiplegia and hemiparesis following cerebral infarction affecting right dominant side (HCC)  Right shoulder pain, unspecified chronicity  Stiffness of right shoulder, not elsewhere classified  Other lack of coordination    Problem List Patient Active Problem List   Diagnosis Date Noted  . Adhesive capsulitis of left shoulder 10/11/2016  . Dupuytren's contracture of right hand 02/18/2016  . Gait disturbance,  post-stroke 01/04/2016  . Hemiparesis affecting right side as late effect of cerebrovascular accident (CVA) ([place]) 01/04/2016  . Cerebral infarction due to thrombosis of left middle cerebral artery ([place]) 12/15/2015  . Left middle cerebral artery stroke ([place]) 12/15/2015  . Oropharyngeal dysphagia   . Aphasia as late effect of stroke   . Right hemiplegia ([place])   . Acute on chronic respiratory failure ([place])   . Essential hypertension   . CVA (cerebral vascular accident) ([place]) 12/08/2015    Eliot Bencivenga 10/12/2016, 11:31 AM  [facility] [address] [place], [place], [address] Phone: [phone]   Fax:  [phone]  Name: James Cortez MRN: [id number] Date of Birth: [birth year]

## 2016-10-12 NOTE — Progress Notes (Signed)
Carelink Summary Report / Loop Recorder 

## 2016-10-14 ENCOUNTER — Ambulatory Visit: Payer: Self-pay | Attending: Family Medicine

## 2016-10-17 LAB — CUP PACEART REMOTE DEVICE CHECK
Implantable Pulse Generator Implant Date: 20171107
MDC IDC SESS DTM: 20180903224138

## 2016-10-17 MED FILL — ?ATORVASTATIN 20 MG TABLET: 20 | 30 days supply | Qty: 30 | Fill #0

## 2016-10-25 ENCOUNTER — Ambulatory Visit: Payer: No Typology Code available for payment source | Admitting: Occupational Therapy

## 2016-10-26 ENCOUNTER — Ambulatory Visit: Payer: No Typology Code available for payment source | Admitting: Occupational Therapy

## 2016-10-26 DIAGNOSIS — I69351 Hemiplegia and hemiparesis following cerebral infarction affecting right dominant side: Secondary | ICD-10-CM

## 2016-10-26 DIAGNOSIS — M25611 Stiffness of right shoulder, not elsewhere classified: Secondary | ICD-10-CM

## 2016-10-26 DIAGNOSIS — M25511 Pain in right shoulder: Secondary | ICD-10-CM

## 2016-10-26 DIAGNOSIS — R278 Other lack of coordination: Secondary | ICD-10-CM

## 2016-10-26 NOTE — Therapy (Signed)
[facility] [address] [place], [place], [address] Phone: [phone]   Fax:  [phone]  Occupational Therapy Treatment  Patient Details  Name: James Cortez MRN: [id number] Date of Birth: [birth year] Referring Provider: Dr. Arnoldo Morale  Encounter Date: 10/26/2016      OT End of Session - 10/26/16 1413    Visit Number 22   Number of Visits 33   Date for OT Re-Evaluation 11/26/16   Authorization Type [employer] 10/08/16-01/07/17   Authorization Time Period  renewal completed 09/27/16 for 8 wks   OT Start Time 1410  late start 2 units only   OT Stop Time 1445   OT Time Calculation (min) 35 min   Activity Tolerance Patient tolerated treatment well   Behavior During Therapy [facility] for tasks assessed/performed      Past Medical History:  Diagnosis Date  . Skin cancer     Past Surgical History:  Procedure Laterality Date  . EP IMPLANTABLE DEVICE N/A 12/15/2015   Procedure: Loop Recorder Insertion;  Surgeon: Will Meredith Leeds, MD;  Location: [place] CV [facility];  Service: Cardiovascular;  Laterality: N/A;  . IR GENERIC HISTORICAL  12/08/2015   IR PERCUTANEOUS ART THROMBECTOMY/INFUSION INTRACRANIAL INC DIAG ANGIO 12/08/2015 Luanne Bras, MD [facility]INTER[facility] RAD  . RADIOLOGY WITH ANESTHESIA Right 12/08/2015   Procedure: RADIOLOGY WITH ANESTHESIA - CODE STROKE;  Surgeon: Medication Radiologist, MD;  Location: [facility];  Service: Radiology;  Laterality: Right;  . SKIN SURGERY    . TEE WITHOUT CARDIOVERSION N/A 12/15/2015   Procedure: TRANSESOPHAGEAL ECHOCARDIOGRAM (TEE);  Surgeon: Sueanne Margarita, MD;  Location: [facility] ENDOSCOPY;  Service: Cardiovascular;  Laterality: N/A;    There were no vitals filed for this visit.      Subjective Assessment - 10/26/16 1412    Pertinent History L MCA distribution infarct, aphasia, R hemiparesis, R hand dupuytren's contracture, HTN   Patient Stated Goals improve RUE pain and  functional use, return to work   Currently in Pain? No/denies        Treatment: Supine shoulder flexion and chest press with ball, 15 reps each Prone scapular stability exercise with arms in extension, min v. Quadraped rocking forwards and backwards x 10 reps min vc. seated mid- high range closed chain shoulder flexion, shoulder abduction x 10 reps with cane Unilateral reaching into overhead cabinets to grasp retrieve items with RUE, min v.c for positioning Putty exercises with red for increased grip and pinch, min v.c for positioning, Arm bike x 5 mins level 3 for conditioning                       OT Short Term Goals - 09/27/16 0906      OT SHORT TERM GOAL #1   Title Pt will be independent with initial HEP.--check STGs 08/30/16   Time 4   Period Weeks   Status Achieved     OT SHORT TERM GOAL #2   Title Pt will report pain less than 6/10 for light BADLs.   Baseline 10/10 with movement   Time 4   Period Weeks   Status Achieved  08/23/16     OT SHORT TERM GOAL #3   Title Pt will demo at least 105* R shoulder flex for light functional reaching.   Baseline 95*   Time 4   Period Weeks   Status Achieved  08/23/16  70* without pain and good positioning.  09/20/16:  110* without pain  OT SHORT TERM GOAL #4   Title Pt will demo at least 50* R shoulder abduction for lateral reaching/ADLs.--check remaining STGs 10/27/16   Baseline 35*   Time 4   Period Weeks   Status On-going  08/23/16  35* without pain and good position.  09/27/16:  40* without pain and good positioning     OT SHORT TERM GOAL #5   Title Pt will verbalize understanding of proper positioning of RUE/adaptions for ADLs to decr RUE pain.   Time 4   Period Weeks   Status Achieved  08/23/16, but continues to need min cueing at times     Additional Short Term Goals   Additional Short Term Goals Yes     OT SHORT TERM GOAL #6   Title Continue with LTG#3 as STG   Time 4   Period Weeks   Status  New   Target Date 10/27/16           OT Long Term Goals - 09/27/16 0827      OT LONG TERM GOAL #1   Title Pt will be independent with updated HEP.--check LTGs 11/26/16   Time 8   Period Weeks   Status On-going  09/27/16:  met with current, but will benefit from additional updates     OT LONG TERM GOAL #2   Title Pt will report pain less than 4/10 for light ADLs/IADLs.   Baseline 10/10 with movement   Time 8   Period Weeks   Status Achieved  09/27/16:  2/10 or less     OT LONG TERM GOAL #3   Title Pt will demo at least 120* R shoulder flex for light functional reaching.--continue as STG   Baseline 95*   Time 8   Period Weeks   Status On-going  09/27/16:  115* without pain     OT LONG TERM GOAL #4   Title Pt will demo at least 70* R shoulder abduction for lateral reaching/ADLs.   Baseline 35*   Time 8   Period Weeks   Status On-going  09/27/16:  40* without pain     OT LONG TERM GOAL #5   Title Pt will improve R grip strength to at least 40lbs for gripping/lifting tasks.   Baseline R-35lbs   Time 8   Period Weeks   Status Revised  09/27/16:  24lbs     OT LONG TERM GOAL #6   Title Pt will improve R hand coordination for ADLs as shown by completing 9-hole peg test in 30sec or less.   Baseline 37.25sec   Time 8   Period Weeks   Status Revised  09/27/16:  47.97sec with shoulder compensations     OT LONG TERM GOAL #7   Title Pt will be able to lift 2lb object from overhead shelf without pain with RUE.   Baseline unable   Time 8   Period Weeks   Status New               Plan - 10/26/16 1414    Clinical Impression Statement Pt is progressing towards goals,. He returns to therapy with decreased overall pain   Rehab Potential Good   OT Frequency 2x / week   OT Duration 8 weeks   OT Treatment/Interventions Self-care/ADL training;Cryotherapy;Parrafin;Therapeutic exercise;DME and/or AE instruction;Functional Mobility Training;Manual Therapy;Neuromuscular  education;Splinting;Fluidtherapy;Ultrasound;Electrical Stimulation;Moist Heat;Contrast Bath;Energy conservation;Passive range of motion;Therapeutic exercises;Therapeutic activities;Patient/family education   Plan  pt approved for [facility], he wishes to continue therapy, work toward unmet goals, address  RUE strength and functional use   OT Home Exercise Plan Education provided:  HEP, proper positiong of RUE    Consulted and Agree with Plan of Care Patient      Patient will benefit from skilled therapeutic intervention in order to improve the following deficits and impairments:  Decreased activity tolerance, Decreased coordination, Decreased knowledge of use of DME, Decreased strength, Impaired UE functional use, Pain, Improper body mechanics, Decreased range of motion, Decreased mobility, Impaired vision/preception, Decreased balance  Visit Diagnosis: Hemiplegia and hemiparesis following cerebral infarction affecting right dominant side (HCC)  Right shoulder pain, unspecified chronicity  Stiffness of right shoulder, not elsewhere classified  Other lack of coordination    Problem List Patient Active Problem List   Diagnosis Date Noted  . Adhesive capsulitis of left shoulder 10/11/2016  . Dupuytren's contracture of right hand 02/18/2016  . Gait disturbance, post-stroke 01/04/2016  . Hemiparesis affecting right side as late effect of cerebrovascular accident (CVA) ([place]) 01/04/2016  . Cerebral infarction due to thrombosis of left middle cerebral artery ([place]) 12/15/2015  . Left middle cerebral artery stroke ([place]) 12/15/2015  . Oropharyngeal dysphagia   . Aphasia as late effect of stroke   . Right hemiplegia ([place])   . Acute on chronic respiratory failure ([place])   . Essential hypertension   . CVA (cerebral vascular accident) ([place]) 12/08/2015    RINE,KATHRYN 10/27/2016, 10:45 AM  [facility] [address] [place], [place],  [address] Phone: [phone]   Fax:  [phone]  Name: James Cortez MRN: [id number] Date of Birth: 1965-08-07

## 2016-11-01 ENCOUNTER — Ambulatory Visit: Payer: No Typology Code available for payment source | Admitting: Occupational Therapy

## 2016-11-01 DIAGNOSIS — I69351 Hemiplegia and hemiparesis following cerebral infarction affecting right dominant side: Secondary | ICD-10-CM

## 2016-11-01 DIAGNOSIS — M25611 Stiffness of right shoulder, not elsewhere classified: Secondary | ICD-10-CM

## 2016-11-01 DIAGNOSIS — R278 Other lack of coordination: Secondary | ICD-10-CM

## 2016-11-01 DIAGNOSIS — M25511 Pain in right shoulder: Secondary | ICD-10-CM

## 2016-11-01 MED FILL — ?CLOPIDOGREL 75MG TAB: 75 | 30 days supply | Qty: 30 | Fill #1

## 2016-11-01 NOTE — Therapy (Signed)
[facility] [address] [place], [place], [address] Phone: [phone]   Fax:  [phone]  Occupational Therapy Treatment  Patient Details  Name: James Cortez MRN: [id number] Date of Birth: [birth year] Referring Provider: Dr. Arnoldo Morale  Encounter Date: 11/01/2016      OT End of Session - 11/01/16 0806    Visit Number 23   Number of Visits 33   Date for OT Re-Evaluation 11/26/16   Authorization Type [employer] 10/08/16-01/07/17   Authorization Time Period  renewal completed 09/27/16 for 8 wks   OT Start Time 0803   OT Stop Time 0847   OT Time Calculation (min) 44 min   Activity Tolerance Patient tolerated treatment well   Behavior During Therapy [facility] for tasks assessed/performed      Past Medical History:  Diagnosis Date  . Skin cancer     Past Surgical History:  Procedure Laterality Date  . EP IMPLANTABLE DEVICE N/A 12/15/2015   Procedure: Loop Recorder Insertion;  Surgeon: Will Meredith Leeds, MD;  Location: [facility];  Service: Cardiovascular;  Laterality: N/A;  . IR GENERIC HISTORICAL  12/08/2015   IR PERCUTANEOUS ART THROMBECTOMY/INFUSION INTRACRANIAL INC DIAG ANGIO 12/08/2015 Luanne Bras, MD [facility]  . RADIOLOGY WITH ANESTHESIA Right 12/08/2015   Procedure: RADIOLOGY WITH ANESTHESIA - CODE STROKE;  Surgeon: Medication Radiologist, MD;  Location: [facility];  Service: Radiology;  Laterality: Right;  . SKIN SURGERY    . TEE WITHOUT CARDIOVERSION N/A 12/15/2015   Procedure: TRANSESOPHAGEAL ECHOCARDIOGRAM (TEE);  Surgeon: Sueanne Margarita, MD;  Location: [facility];  Service: Cardiovascular;  Laterality: N/A;    There were no vitals filed for this visit.      Subjective Assessment - 11/01/16 0804    Subjective  It's better   Patient is accompained by: Interpreter   Pertinent History L MCA distribution infarct, aphasia, R hemiparesis, R hand dupuytren's contracture, HTN   Limitations native Spanish speaking, uses interpreter   Patient Stated Goals improve RUE pain and functional use, return to work   Currently in Pain? Yes   Pain Score 4    Pain Location --  shoulder   Pain Orientation Right   Pain Descriptors / Indicators Sharp   Pain Type Chronic pain   Pain Onset More than a month ago   Pain Frequency Intermittent   Aggravating Factors  IR, only with movement   Pain Relieving Factors proper positioning       In supine, shoulder flex, chest press, and diagonals within pain-free ROM with BUEs with ball for AAROM with min cueing intermittently for positioning.  Shoulder flex with 1lb wt. x10 with min cueing/facilitation for positioning with no pain reported.  Circumduction at 90* with 1lb wt without pain, but min facilitation/cueing for positioning.  In prone, scapular ex with shoulders in ext with 1lb wt. In each hand x15, min cueing.  In quadruped, forward/backward wt. Shifts, stabilizing RUE while lifting LUE, cat/cow positions for incr scapular stability/mobility with min cueing throughout for positioning/technique.  In sitting, picking up blocks using shoulder ER and gripper set on level 2 for incr sustained grip strength/endurance.  Assessed shoulder flex and abduction ROM and grip strength--see goals section below.  Pt reports that he is using RUE for all light ADLs now.  Educated pt in proper positioning/normal movement patterns with abduction as pt now with higher ROM available but begins to hike >90* without cueing.  Pt verbalized understanding.  Mid-high level functional reaching  to place clothespins with 1-8lb resistance on vertical pole with min cueing for shoulder hike with higher ranges with pinching.  Arm bike x5 min level 3 for reciprocal movement/conditioning without pain/rest.                         OT Short Term Goals - 11/01/16 0843      OT SHORT TERM GOAL #1   Title Pt will be independent with initial  HEP.--check STGs 08/30/16   Time 4   Period Weeks   Status Achieved     OT SHORT TERM GOAL #2   Title Pt will report pain less than 6/10 for light BADLs.   Baseline 10/10 with movement   Time 4   Period Weeks   Status Achieved  08/23/16     OT SHORT TERM GOAL #3   Title Pt will demo at least 105* R shoulder flex for light functional reaching.   Baseline 95*   Time 4   Period Weeks   Status Achieved  08/23/16  70* without pain and good positioning.  09/20/16:  110* without pain     OT SHORT TERM GOAL #4   Title Pt will demo at least 50* R shoulder abduction for lateral reaching/ADLs.--check remaining STGs 10/27/16   Baseline 35*   Time 4   Period Weeks   Status Achieved  08/23/16  35* without pain and good position.  09/27/16:  40* without pain and good positioning.  11/01/16  up to 150* with 2/10 pain with proper positioning/cueing     OT SHORT TERM GOAL #5   Title Pt will verbalize understanding of proper positioning of RUE/adaptions for ADLs to decr RUE pain.   Time 4   Period Weeks   Status Achieved  08/23/16, but continues to need min cueing at times     OT SHORT TERM GOAL #6   Title Continue with LTG#3 as STG   Time 4   Period Weeks   Status Achieved  11/01/16           OT Long Term Goals - 11/01/16 0844      OT LONG TERM GOAL #1   Title Pt will be independent with updated HEP.--check LTGs 11/26/16   Time 8   Period Weeks   Status On-going  09/27/16:  met with current, but will benefit from additional updates     OT LONG TERM GOAL #2   Title Pt will report pain less than 4/10 for light ADLs/IADLs.   Baseline 10/10 with movement   Time 8   Period Weeks   Status Achieved  09/27/16:  2/10 or less     OT LONG TERM GOAL #3   Title Pt will demo at least 120* R shoulder flex for light functional reaching.--continue as STG   Baseline 95*   Time 8   Period Weeks   Status Achieved  09/27/16:  115* without pain.  10/22/16  160* without pain     OT LONG TERM GOAL  #4   Title Pt will demo at least 70* R shoulder abduction for lateral reaching/ADLs.---Updated 11/01/16:  Pt will demo at least 120* R shoulder abduction without pain or cueing.   Baseline 35*   Time 8   Period Weeks   Status Achieved  09/27/16:  40* without pain.  11/01/16  met initial goal 100* without pain, but able to achieve 150* with min cueing for proper positioning with 2/10 pain  OT LONG TERM GOAL #5   Title Pt will improve R grip strength to at least 40lbs for gripping/lifting tasks.   Baseline R-35lbs   Time 8   Period Weeks   Status Revised  09/27/16:  24lbs.  11/01/16:  28lbs     OT LONG TERM GOAL #6   Title Pt will improve R hand coordination for ADLs as shown by completing 9-hole peg test in 30sec or less.   Baseline 37.25sec   Time 8   Period Weeks   Status Revised  09/27/16:  47.97sec with shoulder compensations     OT LONG TERM GOAL #7   Title Pt will be able to lift 2lb object from overhead shelf without pain with RUE.   Baseline unable   Time 8   Period Weeks   Status New               Plan - 11/01/16 3295    Clinical Impression Statement Pt is making excellent progress towards goals.  Pt tolerating incr activity and wt. bearing tasks well.  Pt met all STGs and revised abduction LTG (as initial goal met).   Rehab Potential Good   OT Frequency 2x / week   OT Duration 8 weeks   OT Treatment/Interventions Self-care/ADL training;Cryotherapy;Parrafin;Therapeutic exercise;DME and/or AE instruction;Functional Mobility Training;Manual Therapy;Neuromuscular education;Splinting;Fluidtherapy;Ultrasound;Electrical Stimulation;Moist Heat;Contrast Bath;Energy conservation;Passive range of motion;Therapeutic exercises;Therapeutic activities;Patient/family education   Plan ?low range theraband HEP, continue to address RUE strength and scapular stability   OT Home Exercise Plan Education provided:  HEP, proper positiong of RUE    Consulted and Agree with Plan of Care  Patient      Patient will benefit from skilled therapeutic intervention in order to improve the following deficits and impairments:  Decreased activity tolerance, Decreased coordination, Decreased knowledge of use of DME, Decreased strength, Impaired UE functional use, Pain, Improper body mechanics, Decreased range of motion, Decreased mobility, Impaired vision/preception, Decreased balance  Visit Diagnosis: Hemiplegia and hemiparesis following cerebral infarction affecting right dominant side (HCC)  Right shoulder pain, unspecified chronicity  Stiffness of right shoulder, not elsewhere classified  Other lack of coordination    Problem List Patient Active Problem List   Diagnosis Date Noted  . Adhesive capsulitis of left shoulder 10/11/2016  . Dupuytren's contracture of right hand 02/18/2016  . Gait disturbance, post-stroke 01/04/2016  . Hemiparesis affecting right side as late effect of cerebrovascular accident (CVA) ([place]) 01/04/2016  . Cerebral infarction due to thrombosis of left middle cerebral artery ([place]) 12/15/2015  . Left middle cerebral artery stroke ([place]) 12/15/2015  . Oropharyngeal dysphagia   . Aphasia as late effect of stroke   . Right hemiplegia ([place])   . Acute on chronic respiratory failure ([place])   . Essential hypertension   . CVA (cerebral vascular accident) [facility]) 12/08/2015    [facility] 11/01/2016, 8:59 AM  [facility] [address] [place], [place], [address] Phone: [phone]   Fax:  [phone]  Name: James Cortez MRN: [id number] Date of Birth: [birth year]   Vianne Bulls, OTR/L [facility] [address] [place],   [address] [phone] phone [phone] 11/01/16 9:06 AM

## 2016-11-03 ENCOUNTER — Ambulatory Visit: Payer: No Typology Code available for payment source | Admitting: Occupational Therapy

## 2016-11-03 DIAGNOSIS — I69351 Hemiplegia and hemiparesis following cerebral infarction affecting right dominant side: Secondary | ICD-10-CM

## 2016-11-03 DIAGNOSIS — M25511 Pain in right shoulder: Secondary | ICD-10-CM

## 2016-11-03 DIAGNOSIS — M25611 Stiffness of right shoulder, not elsewhere classified: Secondary | ICD-10-CM

## 2016-11-03 NOTE — Therapy (Signed)
[facility] [address] [place], [place], [address] Phone: [phone]   Fax:  [phone]  Occupational Therapy Treatment  Patient Details  Name: James Cortez MRN: [id number] Date of Birth: [birth year] Referring Provider: Dr. Arnoldo Morale  Encounter Date: 11/03/2016      OT End of Session - 11/03/16 0843    Visit Number 24   Number of Visits 33   Date for OT Re-Evaluation 11/26/16   Authorization Type [employer] 10/08/16-01/07/17   Authorization Time Period  renewal completed 09/27/16 for 8 wks   OT Start Time 0800   OT Stop Time 0840   OT Time Calculation (min) 40 min   Activity Tolerance Patient tolerated treatment well      Past Medical History:  Diagnosis Date  . Skin cancer     Past Surgical History:  Procedure Laterality Date  . EP IMPLANTABLE DEVICE N/A 12/15/2015   Procedure: Loop Recorder Insertion;  Surgeon: Will Meredith Leeds, MD;  Location: [facility];  Service: Cardiovascular;  Laterality: N/A;  . IR GENERIC HISTORICAL  12/08/2015   IR PERCUTANEOUS ART THROMBECTOMY/INFUSION INTRACRANIAL INC DIAG ANGIO 12/08/2015 Luanne Bras, MD [facility]  . RADIOLOGY WITH ANESTHESIA Right 12/08/2015   Procedure: RADIOLOGY WITH ANESTHESIA - CODE STROKE;  Surgeon: Medication Radiologist, MD;  Location: [facility];  Service: Radiology;  Laterality: Right;  . SKIN SURGERY    . TEE WITHOUT CARDIOVERSION N/A 12/15/2015   Procedure: TRANSESOPHAGEAL ECHOCARDIOGRAM (TEE);  Surgeon: Sueanne Margarita, MD;  Location: [facility] ENDOSCOPY;  Service: Cardiovascular;  Laterality: N/A;    There were no vitals filed for this visit.      Subjective Assessment - 11/03/16 0805    Pertinent History L MCA distribution infarct, aphasia, R hemiparesis, R hand dupuytren's contracture, HTN   Limitations native Spanish speaking, uses interpreter   Patient Stated Goals improve RUE pain and functional use, return to work   Currently in Pain? Yes   Pain Score 3    Pain Location Shoulder   Pain Orientation Right   Pain Type Chronic pain   Pain Onset More than a month ago   Pain Frequency Intermittent   Aggravating Factors  IR, only with movement   Pain Relieving Factors proper positioning                      OT Treatments/Exercises (OP) - 11/03/16 0001      Exercises   Exercises Shoulder     Shoulder Exercises: ROM/Strengthening   UBE (Upper Arm Bike) UBE x 8 min. Level 3 resistance   Other ROM/Strengthening Exercises Pt issued theraband HEP - see pt instructions for details. Issued yellow resistance band. Pt required min cues not to shoulder hike but could correct after cueing     Functional Reaching Activities   High Level Mid to high level reaching to place rubber washers on prongs at various heights with min cueing for positioning                OT Education - 11/03/16 0836    Education provided Yes   Education Details Theraband HEP   Person(s) Educated Patient   Methods Explanation;Demonstration;Handout;Verbal cues  HANDOUT ISSUED IN [place]   Comprehension Verbalized understanding;Returned demonstration;Verbal cues required;Need further instruction          OT Short Term Goals - 11/01/16 0843      OT SHORT TERM GOAL #1   Title Pt will be independent with  initial HEP.--check STGs 08/30/16   Time 4   Period Weeks   Status Achieved     OT SHORT TERM GOAL #2   Title Pt will report pain less than 6/10 for light BADLs.   Baseline 10/10 with movement   Time 4   Period Weeks   Status Achieved  08/23/16     OT SHORT TERM GOAL #3   Title Pt will demo at least 105* R shoulder flex for light functional reaching.   Baseline 95*   Time 4   Period Weeks   Status Achieved  08/23/16  70* without pain and good positioning.  09/20/16:  110* without pain     OT SHORT TERM GOAL #4   Title Pt will demo at least 50* R shoulder abduction for lateral reaching/ADLs.--check  remaining STGs 10/27/16   Baseline 35*   Time 4   Period Weeks   Status Achieved  08/23/16  35* without pain and good position.  09/27/16:  40* without pain and good positioning.  11/01/16  up to 150* with 2/10 pain with proper positioning/cueing     OT SHORT TERM GOAL #5   Title Pt will verbalize understanding of proper positioning of RUE/adaptions for ADLs to decr RUE pain.   Time 4   Period Weeks   Status Achieved  08/23/16, but continues to need min cueing at times     OT SHORT TERM GOAL #6   Title Continue with LTG#3 as STG   Time 4   Period Weeks   Status Achieved  11/01/16           OT Long Term Goals - 11/01/16 0844      OT LONG TERM GOAL #1   Title Pt will be independent with updated HEP.--check LTGs 11/26/16   Time 8   Period Weeks   Status On-going  09/27/16:  met with current, but will benefit from additional updates     OT LONG TERM GOAL #2   Title Pt will report pain less than 4/10 for light ADLs/IADLs.   Baseline 10/10 with movement   Time 8   Period Weeks   Status Achieved  09/27/16:  2/10 or less     OT LONG TERM GOAL #3   Title Pt will demo at least 120* R shoulder flex for light functional reaching.--continue as STG   Baseline 95*   Time 8   Period Weeks   Status Achieved  09/27/16:  115* without pain.  10/22/16  160* without pain     OT LONG TERM GOAL #4   Title Pt will demo at least 70* R shoulder abduction for lateral reaching/ADLs.---Updated 11/01/16:  Pt will demo at least 120* R shoulder abduction without pain or cueing.   Baseline 35*   Time 8   Period Weeks   Status Achieved  09/27/16:  40* without pain.  11/01/16  met initial goal 100* without pain, but able to achieve 150* with min cueing for proper positioning with 2/10 pain     OT LONG TERM GOAL #5   Title Pt will improve R grip strength to at least 40lbs for gripping/lifting tasks.   Baseline R-35lbs   Time 8   Period Weeks   Status Revised  09/27/16:  24lbs.  11/01/16:  28lbs     OT  LONG TERM GOAL #6   Title Pt will improve R hand coordination for ADLs as shown by completing 9-hole peg test in 30sec or less.   Baseline  37.25sec   Time 8   Period Weeks   Status Revised  09/27/16:  47.97sec with shoulder compensations     OT LONG TERM GOAL #7   Title Pt will be able to lift 2lb object from overhead shelf without pain with RUE.   Baseline unable   Time 8   Period Weeks   Status New               Plan - 11/03/16 0844    Clinical Impression Statement Pt continues to make progress towards goals. Pt compensating with sh. hiking and scapula elevation during certain ex's but can correct with cueing.    Rehab Potential Good   OT Frequency 2x / week   OT Duration 8 weeks   OT Treatment/Interventions Self-care/ADL training;Cryotherapy;Parrafin;Therapeutic exercise;DME and/or AE instruction;Functional Mobility Training;Manual Therapy;Neuromuscular education;Splinting;Fluidtherapy;Ultrasound;Electrical Stimulation;Moist Heat;Contrast Bath;Energy conservation;Passive range of motion;Therapeutic exercises;Therapeutic activities;Patient/family education   Plan review low range theraband HEP, continue to address RUE strength and scapula stability, work on downward depression of scapula   OT Home Exercise Plan Education provided:  HEP, proper positiong of RUE, theraband HEP   Consulted and Agree with Plan of Care Patient      Patient will benefit from skilled therapeutic intervention in order to improve the following deficits and impairments:  Decreased activity tolerance, Decreased coordination, Decreased knowledge of use of DME, Decreased strength, Impaired UE functional use, Pain, Improper body mechanics, Decreased range of motion, Decreased mobility, Impaired vision/preception, Decreased balance  Visit Diagnosis: Hemiplegia and hemiparesis following cerebral infarction affecting right dominant side (HCC)  Right shoulder pain, unspecified chronicity  Stiffness of right  shoulder, not elsewhere classified    Problem List Patient Active Problem List   Diagnosis Date Noted  . Adhesive capsulitis of left shoulder 10/11/2016  . Dupuytren's contracture of right hand 02/18/2016  . Gait disturbance, post-stroke 01/04/2016  . Hemiparesis affecting right side as late effect of cerebrovascular accident (CVA) ([place]) 01/04/2016  . Cerebral infarction due to thrombosis of left middle cerebral artery ([place]) 12/15/2015  . Left middle cerebral artery stroke ([place]) 12/15/2015  . Oropharyngeal dysphagia   . Aphasia as late effect of stroke   . Right hemiplegia ([place])   . Acute on chronic respiratory failure ([place])   . Essential hypertension   . CVA (cerebral vascular accident) ([place]) 12/08/2015    Carey Bullocks, OTR/L 11/03/2016, 8:46 AM  [facility] [address] [place], [place], [address] Phone: [phone]   Fax:  [phone]  Name: James Cortez MRN: [id number] Date of Birth: 08-22-1965

## 2016-11-03 NOTE — Patient Instructions (Signed)
Strengthening: Resisted Flexion    Sostenga una banda elstica con el brazo izquierdo al lado del cuerpo. Lleve la banda hacia adelante y [place] a travs del rango de movimiento libre de Social research officer, government. Repita _10___ veces por rutina. Realice __5__ [place]ina [place] por sesin. Realice _4___ sesiones por da.       (Home) Extension / Flexion (Assist)    De frente al punto de enganche, brazo derecho al frente y [place] tanto como le sea posible dentro de un rango de movimiento sin Social research officer, government. Leve el brazo hacia abajo y al lado del cuerpo. Repita _10___ veces por rutina. Realice __0__ Albertina [place] por sesin. Realice 2 sesiones por dia  Scapular Retraction: Bilateral    De frente al punto de agarre, lleve los brazos hacia atrs, mientras junta los omplatos. Repita _10___ veces por rutina. Realice __0__ Albertina [place] por sesin. Realice __8__ sesiones por da.  Resisted Horizontal Abduction: Bilateral    Sentado o de pie, agarre la banda elstica con ambas manos, brazos enfrente del cuerpo. Mantenga los brazos extendidos, [employer] omplatos y [employer] brazos a los lados del cuerpo. Repita _10___ veces por rutina. Realice _6___ [place]ina [place] por sesin. Realice _7___ sesiones por da.

## 2016-11-04 ENCOUNTER — Encounter: Payer: Self-pay | Admitting: Occupational Therapy

## 2016-11-09 ENCOUNTER — Ambulatory Visit (INDEPENDENT_AMBULATORY_CARE_PROVIDER_SITE_OTHER): Payer: Self-pay | Admitting: *Deleted

## 2016-11-09 DIAGNOSIS — I639 Cerebral infarction, unspecified: Secondary | ICD-10-CM

## 2016-11-10 ENCOUNTER — Ambulatory Visit (INDEPENDENT_AMBULATORY_CARE_PROVIDER_SITE_OTHER): Payer: Self-pay | Admitting: Otolaryngology

## 2016-11-10 NOTE — Progress Notes (Signed)
Carelink Summary Report / Loop Recorder 

## 2016-11-11 ENCOUNTER — Ambulatory Visit: Payer: Self-pay | Attending: Family Medicine | Admitting: Occupational Therapy

## 2016-11-11 DIAGNOSIS — R278 Other lack of coordination: Secondary | ICD-10-CM | POA: Insufficient documentation

## 2016-11-11 DIAGNOSIS — I69351 Hemiplegia and hemiparesis following cerebral infarction affecting right dominant side: Secondary | ICD-10-CM | POA: Insufficient documentation

## 2016-11-11 DIAGNOSIS — M25511 Pain in right shoulder: Secondary | ICD-10-CM | POA: Insufficient documentation

## 2016-11-11 DIAGNOSIS — M25611 Stiffness of right shoulder, not elsewhere classified: Secondary | ICD-10-CM | POA: Insufficient documentation

## 2016-11-11 LAB — CUP PACEART REMOTE DEVICE CHECK
Implantable Pulse Generator Implant Date: 20171107
MDC IDC SESS DTM: 20181003234015

## 2016-11-11 NOTE — Therapy (Signed)
[facility] [address] [place], [place], [address] Phone: [phone]   Fax:  [phone]  Occupational Therapy Treatment  Patient Details  Name: James Cortez MRN: [id number] Date of Birth: 28-Oct-1965 Referring Provider: Dr. Arnoldo Morale  Encounter Date: 11/11/2016      OT End of Session - 11/11/16 0836    Visit Number 25   Number of Visits 33   Date for OT Re-Evaluation 11/26/16   Authorization Type [employer] 10/08/16-01/07/17   Authorization Time Period  renewal completed 09/27/16 for 8 wks   OT Start Time 0805   OT Stop Time 0845   OT Time Calculation (min) 40 min   Activity Tolerance Patient tolerated treatment well   Behavior During Therapy [facility] for tasks assessed/performed      Past Medical History:  Diagnosis Date  . Skin cancer     Past Surgical History:  Procedure Laterality Date  . EP IMPLANTABLE DEVICE N/A 12/15/2015   Procedure: Loop Recorder Insertion;  Surgeon: Will Meredith Leeds, MD;  Location: [place] [facility];  Service: Cardiovascular;  Laterality: N/A;  . IR GENERIC HISTORICAL  12/08/2015   IR PERCUTANEOUS ART THROMBECTOMY/INFUSION INTRACRANIAL INC DIAG ANGIO 12/08/2015 Luanne Bras, MD [facility]-INTERV RAD  . RADIOLOGY WITH ANESTHESIA Right 12/08/2015   Procedure: RADIOLOGY WITH ANESTHESIA - CODE STROKE;  Surgeon: Medication Radiologist, MD;  Location: [place];  Service: Radiology;  Laterality: Right;  . SKIN SURGERY    . TEE WITHOUT CARDIOVERSION N/A 12/15/2015   Procedure: TRANSESOPHAGEAL ECHOCARDIOGRAM (TEE);  Surgeon: Sueanne Margarita, MD;  Location: [facility] ENDOSCOPY;  Service: Cardiovascular;  Laterality: N/A;    There were no vitals filed for this visit.      Subjective Assessment - 11/11/16 0834    Subjective  Denies pain at rest, pt reports pain only with malpositioning   Pertinent History L MCA distribution infarct, aphasia, R hemiparesis, R hand dupuytren's contracture, HTN    Patient Stated Goals improve RUE pain and functional use, return to work   Currently in Pain? No/denies  no pain on arrival           Treatment:Supine scapular retraction x 10 reps, chest press and shoulder flexion with bilateral UE's followed by seated shoulder flexion with ball and bilateral UE's min facilitation. Functional reaching activities to retrieve items in overhead cabinets, placing removing large pegs and placing removing graded clothespins from vertical antennae. Reviewed yellow theraband HEP issued last visit, pt returned demonstration with min v.c. Arm bike x 8 mins level 3 for conditioning                      OT Short Term Goals - 11/01/16 0843      OT SHORT TERM GOAL #1   Title Pt will be independent with initial HEP.--check STGs 08/30/16   Time 4   Period Weeks   Status Achieved     OT SHORT TERM GOAL #2   Title Pt will report pain less than 6/10 for light BADLs.   Baseline 10/10 with movement   Time 4   Period Weeks   Status Achieved  08/23/16     OT SHORT TERM GOAL #3   Title Pt will demo at least 105* R shoulder flex for light functional reaching.   Baseline 95*   Time 4   Period Weeks   Status Achieved  08/23/16  70* without pain and good positioning.  09/20/16:  110* without pain  OT SHORT TERM GOAL #4   Title Pt will demo at least 50* R shoulder abduction for lateral reaching/ADLs.--check remaining STGs 10/27/16   Baseline 35*   Time 4   Period Weeks   Status Achieved  08/23/16  35* without pain and good position.  09/27/16:  40* without pain and good positioning.  11/01/16  up to 150* with 2/10 pain with proper positioning/cueing     OT SHORT TERM GOAL #5   Title Pt will verbalize understanding of proper positioning of RUE/adaptions for ADLs to decr RUE pain.   Time 4   Period Weeks   Status Achieved  08/23/16, but continues to need min cueing at times     OT SHORT TERM GOAL #6   Title Continue with LTG#3 as STG   Time 4    Period Weeks   Status Achieved  11/01/16           OT Long Term Goals - 11/01/16 0844      OT LONG TERM GOAL #1   Title Pt will be independent with updated HEP.--check LTGs 11/26/16   Time 8   Period Weeks   Status On-going  09/27/16:  met with current, but will benefit from additional updates     OT LONG TERM GOAL #2   Title Pt will report pain less than 4/10 for light ADLs/IADLs.   Baseline 10/10 with movement   Time 8   Period Weeks   Status Achieved  09/27/16:  2/10 or less     OT LONG TERM GOAL #3   Title Pt will demo at least 120* R shoulder flex for light functional reaching.--continue as STG   Baseline 95*   Time 8   Period Weeks   Status Achieved  09/27/16:  115* without pain.  10/22/16  160* without pain     OT LONG TERM GOAL #4   Title Pt will demo at least 70* R shoulder abduction for lateral reaching/ADLs.---Updated 11/01/16:  Pt will demo at least 120* R shoulder abduction without pain or cueing.   Baseline 35*   Time 8   Period Weeks   Status Achieved  09/27/16:  40* without pain.  11/01/16  met initial goal 100* without pain, but able to achieve 150* with min cueing for proper positioning with 2/10 pain     OT LONG TERM GOAL #5   Title Pt will improve R grip strength to at least 40lbs for gripping/lifting tasks.   Baseline R-35lbs   Time 8   Period Weeks   Status Revised  09/27/16:  24lbs.  11/01/16:  28lbs     OT LONG TERM GOAL #6   Title Pt will improve R hand coordination for ADLs as shown by completing 9-hole peg test in 30sec or less.   Baseline 37.25sec   Time 8   Period Weeks   Status Revised  09/27/16:  47.97sec with shoulder compensations     OT LONG TERM GOAL #7   Title Pt will be able to lift 2lb object from overhead shelf without pain with RUE.   Baseline unable   Time 8   Period Weeks   Status New               Plan - 11/11/16 3762    Clinical Impression Statement Pt continues to progress overall. Pt continues to require  reinforcement of proper positioning to minimize shoulder pain.   Rehab Potential Good   OT Frequency 2x / week  OT Duration 8 weeks   OT Treatment/Interventions Self-care/ADL training;Cryotherapy;Parrafin;Therapeutic exercise;DME and/or AE instruction;Functional Mobility Training;Manual Therapy;Neuromuscular education;Splinting;Fluidtherapy;Ultrasound;Electrical Stimulation;Moist Heat;Contrast Bath;Energy conservation;Passive range of motion;Therapeutic exercises;Therapeutic activities;Patient/family education   Plan scapula/ UE strengthening   OT Home Exercise Plan Education provided:  HEP, proper positiong of RUE, theraband HEP   Consulted and Agree with Plan of Care Patient      Patient will benefit from skilled therapeutic intervention in order to improve the following deficits and impairments:  Decreased activity tolerance, Decreased coordination, Decreased knowledge of use of DME, Decreased strength, Impaired UE functional use, Pain, Improper body mechanics, Decreased range of motion, Decreased mobility, Impaired vision/preception, Decreased balance  Visit Diagnosis: Hemiplegia and hemiparesis following cerebral infarction affecting right dominant side (HCC)  Right shoulder pain, unspecified chronicity  Stiffness of right shoulder, not elsewhere classified  Other lack of coordination    Problem List Patient Active Problem List   Diagnosis Date Noted  . Adhesive capsulitis of left shoulder 10/11/2016  . Dupuytren's contracture of right hand 02/18/2016  . Gait disturbance, post-stroke 01/04/2016  . Hemiparesis affecting right side as late effect of cerebrovascular accident (CVA) ([place]) 01/04/2016  . Cerebral infarction due to thrombosis of left middle cerebral artery ([place]) 12/15/2015  . Left middle cerebral artery stroke ([place]) 12/15/2015  . Oropharyngeal dysphagia   . Aphasia as late effect of stroke   . Right hemiplegia ([place])   . Acute on chronic respiratory failure ([place])    . Essential hypertension   . CVA (cerebral vascular accident) ([place]) 12/08/2015    Tiwanda Threats 11/11/2016, 8:43 AM  [facility] [address] [place], [place], [address] Phone: [phone]   Fax:  [phone]  Name: Rehaan Viloria MRN: [id number] Date of Birth: 12-16-1965

## 2016-11-15 ENCOUNTER — Ambulatory Visit: Payer: Self-pay | Admitting: Occupational Therapy

## 2016-11-15 DIAGNOSIS — M25511 Pain in right shoulder: Secondary | ICD-10-CM

## 2016-11-15 DIAGNOSIS — R278 Other lack of coordination: Secondary | ICD-10-CM

## 2016-11-15 DIAGNOSIS — I69351 Hemiplegia and hemiparesis following cerebral infarction affecting right dominant side: Secondary | ICD-10-CM

## 2016-11-15 DIAGNOSIS — M25611 Stiffness of right shoulder, not elsewhere classified: Secondary | ICD-10-CM

## 2016-11-15 NOTE — Therapy (Signed)
[facility] [address] [place], [place], [address] Phone: [phone]   Fax:  [phone]  Occupational Therapy Treatment  Patient Details  Name: James Cortez MRN: [id number] Date of Birth: 09/19/1965 Referring Provider: Dr. Arnoldo Morale  Encounter Date: 11/15/2016      OT End of Session - 11/15/16 0813    Visit Number 26   Number of Visits 33   Date for OT Re-Evaluation 11/26/16   Authorization Type [employer] 10/08/16-01/07/17   Authorization Time Period  renewal completed 09/27/16 for 8 wks   OT Start Time 0808   OT Stop Time 0846   OT Time Calculation (min) 38 min   Activity Tolerance Patient tolerated treatment well   Behavior During Therapy [facility] for tasks assessed/performed      Past Medical History:  Diagnosis Date  . Skin cancer     Past Surgical History:  Procedure Laterality Date  . EP IMPLANTABLE DEVICE N/A 12/15/2015   Procedure: Loop Recorder Insertion;  Surgeon: Will Meredith Leeds, MD;  Location: [place] CV LAB;  Service: Cardiovascular;  Laterality: N/A;  . IR GENERIC HISTORICAL  12/08/2015   IR PERCUTANEOUS ART THROMBECTOMY/INFUSION INTRACRANIAL INC DIAG ANGIO 12/08/2015 Luanne Bras, MD [facility]INTER[facility] RAD  . RADIOLOGY WITH ANESTHESIA Right 12/08/2015   Procedure: RADIOLOGY WITH ANESTHESIA - CODE STROKE;  Surgeon: Medication Radiologist, MD;  Location: [place];  Service: Radiology;  Laterality: Right;  . SKIN SURGERY    . TEE WITHOUT CARDIOVERSION N/A 12/15/2015   Procedure: TRANSESOPHAGEAL ECHOCARDIOGRAM (TEE);  Surgeon: Sueanne Margarita, MD;  Location: [facility] ENDOSCOPY;  Service: Cardiovascular;  Laterality: N/A;    There were no vitals filed for this visit.      Subjective Assessment - 11/15/16 0812    Subjective  Pt reports pain only when not moving it as much   Pertinent History L MCA distribution infarct, aphasia, R hemiparesis, R hand dupuytren's contracture, HTN   Patient  Stated Goals improve RUE pain and functional use, return to work   Currently in Pain? No/denies        In supine, shoulder flex and diagonals within pain-free ROM with BUEs with ball for AAROM with min cueing intermittently for positioning.  Shoulder flex  And chest press RUE with 2lb wt. x10 with min cueing/facilitation for positioning with no pain reported.  Circumduction at 90* with 2lb wt without pain, but min facilitation/cueing for positioning.  In prone, scapular ex with shoulders in ext  x20, min cueing.  In quadruped, stabilizing RUE while lifting LUE, cat/cow positions for incr scapular stability/mobility with min cueing throughout for positioning/technique.  In sitting, AAROM shoulder flex and abduction with ball with BUEs with min cueing for proper positioning.  High level functional reaching to place small pegs in vertical pegboard for incr coordination and functional reach.  Then manipulating pegs in hand before placing in pegboard and removing with in-hand manipulation.  Arm bike x5 min level 5 for reciprocal movement/conditioning without pain/rest.                         OT Short Term Goals - 11/01/16 0843      OT SHORT TERM GOAL #1   Title Pt will be independent with initial HEP.--check STGs 08/30/16   Time 4   Period Weeks   Status Achieved     OT SHORT TERM GOAL #2   Title Pt will report pain less than 6/10 for light BADLs.  Baseline 10/10 with movement   Time 4   Period Weeks   Status Achieved  08/23/16     OT SHORT TERM GOAL #3   Title Pt will demo at least 105* R shoulder flex for light functional reaching.   Baseline 95*   Time 4   Period Weeks   Status Achieved  08/23/16  70* without pain and good positioning.  09/20/16:  110* without pain     OT SHORT TERM GOAL #4   Title Pt will demo at least 50* R shoulder abduction for lateral reaching/ADLs.--check remaining STGs 10/27/16   Baseline 35*   Time 4   Period Weeks   Status  Achieved  08/23/16  35* without pain and good position.  09/27/16:  40* without pain and good positioning.  11/01/16  up to 150* with 2/10 pain with proper positioning/cueing     OT SHORT TERM GOAL #5   Title Pt will verbalize understanding of proper positioning of RUE/adaptions for ADLs to decr RUE pain.   Time 4   Period Weeks   Status Achieved  08/23/16, but continues to need min cueing at times     OT SHORT TERM GOAL #6   Title Continue with LTG#3 as STG   Time 4   Period Weeks   Status Achieved  11/01/16           OT Long Term Goals - 11/01/16 0844      OT LONG TERM GOAL #1   Title Pt will be independent with updated HEP.--check LTGs 11/26/16   Time 8   Period Weeks   Status On-going  09/27/16:  met with current, but will benefit from additional updates     OT LONG TERM GOAL #2   Title Pt will report pain less than 4/10 for light ADLs/IADLs.   Baseline 10/10 with movement   Time 8   Period Weeks   Status Achieved  09/27/16:  2/10 or less     OT LONG TERM GOAL #3   Title Pt will demo at least 120* R shoulder flex for light functional reaching.--continue as STG   Baseline 95*   Time 8   Period Weeks   Status Achieved  09/27/16:  115* without pain.  10/22/16  160* without pain     OT LONG TERM GOAL #4   Title Pt will demo at least 70* R shoulder abduction for lateral reaching/ADLs.---Updated 11/01/16:  Pt will demo at least 120* R shoulder abduction without pain or cueing.   Baseline 35*   Time 8   Period Weeks   Status Achieved  09/27/16:  40* without pain.  11/01/16  met initial goal 100* without pain, but able to achieve 150* with min cueing for proper positioning with 2/10 pain     OT LONG TERM GOAL #5   Title Pt will improve R grip strength to at least 40lbs for gripping/lifting tasks.   Baseline R-35lbs   Time 8   Period Weeks   Status Revised  09/27/16:  24lbs.  11/01/16:  28lbs     OT LONG TERM GOAL #6   Title Pt will improve R hand coordination for ADLs as  shown by completing 9-hole peg test in 30sec or less.   Baseline 37.25sec   Time 8   Period Weeks   Status Revised  09/27/16:  47.97sec with shoulder compensations     OT LONG TERM GOAL #7   Title Pt will be able to lift 2lb object  from overhead shelf without pain with RUE.   Baseline unable   Time 8   Period Weeks   Status New               Plan - 11/15/16 0813    Clinical Impression Statement Pt continues to make good progress with improving strength, coordination, and less shoulder hike with overhead reaching, but continues to need cueing for positioning at times, particularly with abduction.   Rehab Potential Good   OT Frequency 2x / week   OT Duration 8 weeks   OT Treatment/Interventions Self-care/ADL training;Cryotherapy;Parrafin;Therapeutic exercise;DME and/or AE instruction;Functional Mobility Training;Manual Therapy;Neuromuscular education;Splinting;Fluidtherapy;Ultrasound;Electrical Stimulation;Moist Heat;Contrast Bath;Energy conservation;Passive range of motion;Therapeutic exercises;Therapeutic activities;Patient/family education   Plan continue to progress with strengthening and scapular stability   OT Home Exercise Plan Education provided:  HEP, proper positiong of RUE, theraband HEP   Consulted and Agree with Plan of Care Patient      Patient will benefit from skilled therapeutic intervention in order to improve the following deficits and impairments:  Decreased activity tolerance, Decreased coordination, Decreased knowledge of use of DME, Decreased strength, Impaired UE functional use, Pain, Improper body mechanics, Decreased range of motion, Decreased mobility, Impaired vision/preception, Decreased balance  Visit Diagnosis: Hemiplegia and hemiparesis following cerebral infarction affecting right dominant side (HCC)  Right shoulder pain, unspecified chronicity  Stiffness of right shoulder, not elsewhere classified  Other lack of coordination    Problem  List Patient Active Problem List   Diagnosis Date Noted  . Adhesive capsulitis of left shoulder 10/11/2016  . Dupuytren's contracture of right hand 02/18/2016  . Gait disturbance, post-stroke 01/04/2016  . Hemiparesis affecting right side as late effect of cerebrovascular accident (CVA) ([place]) 01/04/2016  . Cerebral infarction due to thrombosis of left middle cerebral artery ([place]) 12/15/2015  . Left middle cerebral artery stroke ([place]) 12/15/2015  . Oropharyngeal dysphagia   . Aphasia as late effect of stroke   . Right hemiplegia ([place])   . Acute on chronic respiratory failure ([place])   . Essential hypertension   . CVA (cerebral vascular accident) [facility]) 12/08/2015    [facility] 11/15/2016, 8:47 AM  [facility] [address] [place], [place], [address] Phone: [phone]   Fax:  [phone]  Name: James Cortez MRN: [id number] Date of Birth: [birth year]   Vianne Bulls, OTR/L [facility] [address] [place], [place]  [address] [phone] phone [phone] 11/15/16 8:47 AM

## 2016-11-18 ENCOUNTER — Encounter: Payer: Self-pay | Admitting: Occupational Therapy

## 2016-11-21 ENCOUNTER — Ambulatory Visit: Payer: Self-pay | Admitting: Occupational Therapy

## 2016-11-21 DIAGNOSIS — M25511 Pain in right shoulder: Secondary | ICD-10-CM

## 2016-11-21 DIAGNOSIS — I69351 Hemiplegia and hemiparesis following cerebral infarction affecting right dominant side: Secondary | ICD-10-CM

## 2016-11-21 DIAGNOSIS — M25611 Stiffness of right shoulder, not elsewhere classified: Secondary | ICD-10-CM

## 2016-11-21 DIAGNOSIS — R278 Other lack of coordination: Secondary | ICD-10-CM

## 2016-11-21 NOTE — Therapy (Signed)
[facility] [address] [place], [place], [address] Phone: [phone]   Fax:  [phone]  Occupational Therapy Treatment  Patient Details  Name: James [facility] MRN: [id number] Date of Birth: 04-12-1965 Referring Provider: Dr. Arnoldo Morale  Encounter Date: 11/21/2016      OT End of Session - 11/21/16 1156    Visit Number 27   Number of Visits 33   Date for OT Re-Evaluation 11/26/16   Authorization Type [employer] 10/08/16-01/07/17   Authorization Time Period  renewal completed 09/27/16 for 8 wks   OT Start Time 1153   OT Stop Time 1231   OT Time Calculation (min) 38 min   Activity Tolerance Patient tolerated treatment well   Behavior During Therapy [facility] for tasks assessed/performed      Past Medical History:  Diagnosis Date  . Skin cancer     Past Surgical History:  Procedure Laterality Date  . EP IMPLANTABLE DEVICE N/A 12/15/2015   Procedure: Loop Recorder Insertion;  Surgeon: Will Meredith Leeds, MD;  Location: [facility];  Service: Cardiovascular;  Laterality: N/A;  . IR GENERIC HISTORICAL  12/08/2015   IR PERCUTANEOUS ART THROMBECTOMY/INFUSION INTRACRANIAL INC DIAG ANGIO 12/08/2015 Luanne Bras, MD [facility]  . RADIOLOGY WITH ANESTHESIA Right 12/08/2015   Procedure: RADIOLOGY WITH ANESTHESIA - CODE STROKE;  Surgeon: Medication Radiologist, MD;  Location: [facility];  Service: Radiology;  Laterality: Right;  . SKIN SURGERY    . TEE WITHOUT CARDIOVERSION N/A 12/15/2015   Procedure: TRANSESOPHAGEAL ECHOCARDIOGRAM (TEE);  Surgeon: Sueanne Margarita, MD;  Location: [facility];  Service: Cardiovascular;  Laterality: N/A;    There were no vitals filed for this visit.      Subjective Assessment - 11/21/16 1156    Subjective  only pain when lying on R side   Patient is accompained by: Interpreter   Pertinent History L MCA distribution infarct, aphasia, R hemiparesis, R hand dupuytren's  contracture, HTN   Patient Stated Goals improve RUE pain and functional use, return to work   Currently in Pain? No/denies       In supine, shoulder flex and diagonals within pain-free ROM with BUEs with ball for AAROM with min cueing intermittently for positioning with abduction.    In prone, scapular ex with shoulders in ext  x20, min cueing.  In sitting, AAROM shoulder flex and abduction with ball with BUEs with min cueing for proper positioning.  Arm bike x5 min level 6 for reciprocal movement/conditioning without pain/rest.  Began checking goals and discussing progress/plan for renewal--see below.  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  Emphasized/educated pt on importance on proper positioning with heavier activities and repetitive work.  Pt verbalized understanding.                       OT Education - 11/21/16 1229    Education Details Updated theraband HEP to red and instructed pt to perform 10-15reps 2x/day, min cueing given for compensation     Person(s) Educated Patient   Methods Verbal cues;Explanation;Demonstration   Comprehension Verbalized understanding;Returned demonstration  returned demo each x15 today          OT Short Term Goals - 11/01/16 0843      OT SHORT TERM GOAL #1   Title Pt will be independent with initial HEP.--check STGs 08/30/16   Time  4   Period Weeks   Status Achieved     OT SHORT TERM GOAL #2   Title Pt will report pain less than 6/10 for light BADLs.   Baseline 10/10 with movement   Time 4   Period Weeks   Status Achieved  08/23/16     OT SHORT TERM GOAL #3   Title Pt will demo at least 105* R shoulder flex for light functional reaching.   Baseline 95*   Time 4   Period Weeks   Status Achieved  08/23/16  70* without pain and good positioning.  09/20/16:  110* without pain     OT SHORT TERM GOAL #4   Title Pt  will demo at least 50* R shoulder abduction for lateral reaching/ADLs.--check remaining STGs 10/27/16   Baseline 35*   Time 4   Period Weeks   Status Achieved  08/23/16  35* without pain and good position.  09/27/16:  40* without pain and good positioning.  11/01/16  up to 150* with 2/10 pain with proper positioning/cueing     OT SHORT TERM GOAL #5   Title Pt will verbalize understanding of proper positioning of RUE/adaptions for ADLs to decr RUE pain.   Time 4   Period Weeks   Status Achieved  08/23/16, but continues to need min cueing at times     OT SHORT TERM GOAL #6   Title Continue with LTG#3 as STG   Time 4   Period Weeks   Status Achieved  11/01/16           OT Long Term Goals - 11/21/16 1158      OT LONG TERM GOAL #1   Title Pt will be independent with updated HEP.--check LTGs 11/26/16   Time 8   Period Weeks   Status On-going  09/27/16:  met with current, but will benefit from additional updates.       OT LONG TERM GOAL #2   Title Pt will report pain less than 4/10 for light ADLs/IADLs.   Baseline 10/10 with movement   Time 8   Period Weeks   Status Achieved  09/27/16:  2/10 or less     OT LONG TERM GOAL #3   Title Pt will demo at least 120* R shoulder flex for light functional reaching.--continue as STG   Baseline 95*   Time 8   Period Weeks   Status Achieved  09/27/16:  115* without pain.  10/22/16  160* without pain     OT LONG TERM GOAL #4   Title Pt will demo at least 70* R shoulder abduction for lateral reaching/ADLs.---Updated 11/01/16:  Pt will demo at least 120* R shoulder abduction without pain or cueing.   Baseline 35*   Time 8   Period Weeks   Status Achieved  09/27/16:  40* without pain.  11/01/16  met initial goal 100* without pain, but able to achieve 150* with min cueing for proper positioning with 2/10 pain     OT LONG TERM GOAL #5   Title Pt will improve R grip strength to at least 40lbs for gripping/lifting tasks.   Baseline R-35lbs   Time  8   Period Weeks   Status Achieved  09/27/16:  24lbs.  11/01/16:  28lbs.  11/21/16:  42lbs     OT LONG TERM GOAL #6   Title Pt will improve R hand coordination for ADLs as shown by completing 9-hole peg test in 30sec or less.   Baseline 37.25sec  Time 8   Period Weeks   Status Achieved  09/27/16:  47.97sec with shoulder compensations.  11/21/16:  26.50sec     OT LONG TERM GOAL #7   Title Pt will be able to lift 2lb object from overhead shelf without pain with RUE.   Baseline unable   Time 8   Period Weeks   Status New               Plan - 11/21/16 1231    Clinical Impression Statement Pt has made good progress.  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   Rehab Potential Good   OT Frequency 2x / week   OT Duration 8 weeks   OT Treatment/Interventions Self-care/ADL training;Cryotherapy;Parrafin;Therapeutic exercise;DME and/or AE instruction;Functional Mobility Training;Manual Therapy;Neuromuscular education;Splinting;Fluidtherapy;Ultrasound;Electrical Stimulation;Moist Heat;Contrast Bath;Energy conservation;Passive range of motion;Therapeutic exercises;Therapeutic activities;Patient/family education   Plan check remaining goals, plan to renew next 1-2 visits for 1x/wk for 4 weeks to progress HEP/for simulated work tasks (pt agreed with plan)   OT Home Exercise Plan Education provided:  HEP, proper positiong of RUE, theraband HEP   Consulted and Agree with Plan of Care Patient      Patient will benefit from skilled therapeutic intervention in order to improve the following deficits and impairments:  Decreased activity tolerance, Decreased coordination, Decreased knowledge of use of DME, Decreased strength, Impaired UE functional use, Pain, Improper body mechanics, Decreased range of motion,  Decreased mobility, Impaired vision/preception, Decreased balance  Visit Diagnosis: Hemiplegia and hemiparesis following cerebral infarction affecting right dominant side (HCC)  Right shoulder pain, unspecified chronicity  Stiffness of right shoulder, not elsewhere classified  Other lack of coordination    Problem List Patient Active Problem List   Diagnosis Date Noted  . Adhesive capsulitis of left shoulder 10/11/2016  . Dupuytren's contracture of right hand 02/18/2016  . Gait disturbance, post-stroke 01/04/2016  . Hemiparesis affecting right side as late effect of cerebrovascular accident (CVA) ([facility]) 01/04/2016  . Cerebral infarction due to thrombosis of left middle cerebral artery ([place]) 12/15/2015  . Left middle cerebral artery stroke ([facility]) 12/15/2015  . Oropharyngeal dysphagia   . Aphasia as late effect of stroke   . Right hemiplegia ([place])   . Acute on chronic respiratory failure ([facility])   . Essential hypertension   . CVA (cerebral vascular accident) [facility]) 12/08/2015    [facility] 11/21/2016, 12:49 PM  [facility] [address] [place], [place], [address] Phone: [phone]   Fax:  [phone]  Name: James [facility] MRN: [id number] Date of Birth: 1965-11-12   Vianne Bulls, OTR/L [facility] [address] [place], [place]  [address] [phone] phone [phone] 11/21/16 12:49 PM

## 2016-11-22 ENCOUNTER — Ambulatory Visit (HOSPITAL_BASED_OUTPATIENT_CLINIC_OR_DEPARTMENT_OTHER): Payer: Self-pay | Admitting: Physical Medicine & Rehabilitation

## 2016-11-22 ENCOUNTER — Encounter: Payer: Self-pay | Admitting: Physical Medicine & Rehabilitation

## 2016-11-22 ENCOUNTER — Encounter: Payer: Self-pay | Attending: Physical Medicine & Rehabilitation

## 2016-11-22 VITALS — BP 118/74 | HR 74 | Resp 14

## 2016-11-22 DIAGNOSIS — M25811 Other specified joint disorders, right shoulder: Secondary | ICD-10-CM | POA: Insufficient documentation

## 2016-11-22 DIAGNOSIS — M25511 Pain in right shoulder: Secondary | ICD-10-CM | POA: Insufficient documentation

## 2016-11-22 DIAGNOSIS — I69351 Hemiplegia and hemiparesis following cerebral infarction affecting right dominant side: Secondary | ICD-10-CM

## 2016-11-22 DIAGNOSIS — Z85828 Personal history of other malignant neoplasm of skin: Secondary | ICD-10-CM | POA: Insufficient documentation

## 2016-11-22 DIAGNOSIS — Z87891 Personal history of nicotine dependence: Secondary | ICD-10-CM | POA: Insufficient documentation

## 2016-11-22 DIAGNOSIS — M7502 Adhesive capsulitis of left shoulder: Secondary | ICD-10-CM

## 2016-11-22 MED FILL — ?ATORVASTATIN 20 MG TABLET: 20 | 30 days supply | Qty: 30 | Fill #1

## 2016-11-22 NOTE — Progress Notes (Signed)
Subjective:    Patient ID: James Cortez, male    DOB: September 20, 1965, 51 y.o.   MRN: [id number] History of left MCA infarct, 12/08/2015 HPI Right shoulder subacromial injection under ultrasound guidance dated 10/11/2016, was not very helpful Prior injection in July was helpful, non-ultrasound-guided  Dressing and bathing independently. Strength is improving, 40 pound grip strength on the right. 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 He is to work 8 hour days. He is independent with his dressing and bathing. His lower extremity strength is improving as well, although it does fatigue more easily on the right side than on the left side. He has been to soccer practice and is able to run, but is not able to kick a ball far, cannot play and again Pain Inventory Average Pain 5 Pain Right Now 5 My pain is intermittent  In the last 24 hours, has pain interfered with the following? General activity 6 Relation with others 6 Enjoyment of life 6 What TIME of day is your pain at its worst? varies with movement Sleep (in general) Fair  Pain is worse with: some activites Pain improves with: rest Relief from Meds: 0  Mobility walk without assistance  Function not employed: date last employed .  Neuro/Psych anxiety  Prior Studies Any changes since last visit?  no  Physicians involved in your care Any changes since last visit?  no   Family History  Problem Relation Age of Onset  . Family history unknown: Yes   Social History   Social History  . Marital status: Married    Spouse name: N/A  . Number of children: N/A  . Years of education: N/A   Social History Main Topics  . Smoking status: Former Smoker    Packs/day: 0.00  . Smokeless tobacco: Never Used     Comment: quit 5 months ago-"isn't even having one"  . Alcohol use No  . Drug use: No  . Sexual activity: Not Asked   Other Topics Concern  . None   Social History Narrative  .  None   Past Surgical History:  Procedure Laterality Date  . EP IMPLANTABLE DEVICE N/A 12/15/2015   Procedure: Loop Recorder Insertion;  Surgeon: Will Meredith Leeds, MD;  Location: [facility];  Service: Cardiovascular;  Laterality: N/A;  . IR GENERIC HISTORICAL  12/08/2015   IR PERCUTANEOUS ART THROMBECTOMY/INFUSION INTRACRANIAL INC DIAG ANGIO 12/08/2015 Luanne Bras, MD [facility]  . RADIOLOGY WITH ANESTHESIA Right 12/08/2015   Procedure: RADIOLOGY WITH ANESTHESIA - CODE STROKE;  Surgeon: Medication Radiologist, MD;  Location: [facility];  Service: Radiology;  Laterality: Right;  . SKIN SURGERY    . TEE WITHOUT CARDIOVERSION N/A 12/15/2015   Procedure: TRANSESOPHAGEAL ECHOCARDIOGRAM (TEE);  Surgeon: Sueanne Margarita, MD;  [facility]: [facility];  Service: Cardiovascular;  Laterality: N/A;   Past Medical History:  Diagnosis Date  . Skin cancer    BP 118/74 (BP Location: Left Arm, Patient Position: Sitting, Cuff Size: Normal)   Pulse 74   Resp 14   SpO2 92%   Opioid Risk Score:   Fall Risk Score:  `1  Depression screen PHQ 2/9  Depression screen [facility] [place] [facility] 2/9 09/26/2016 06/20/2016 04/22/2016 02/18/2016 01/13/2016  Decreased Interest 3 0 1 0 3  Down, Depressed, Hopeless 0 0 0 0 0  PHQ - 2 Score 3 0 1 0 3  Altered sleeping 0 0 0 - 0  Tired, decreased energy 0 0 0 - 0  Change in appetite 0 0 3 - 0  Feeling bad or failure about yourself  0 0 0 - 0  Trouble concentrating 0 0 0 - 3  Moving slowly or fidgety/restless 0 0 0 - 3  Suicidal thoughts 0 0 0 - 0  PHQ-9 Score 3 0 4 - 9    Review of Systems  Constitutional: Negative.   HENT: Negative.   Eyes: Negative.   Respiratory: Negative.   Cardiovascular: Negative.   Gastrointestinal: Negative.   Endocrine: Negative.   Genitourinary: Negative.   Musculoskeletal: Positive for arthralgias.  Allergic/Immunologic: Negative.   Neurological: Negative.   Hematological: Negative.   Psychiatric/Behavioral: The patient is  nervous/anxious.   All other systems reviewed and are negative.      Objective:   Physical Exam  Constitutional: He is oriented to person, place, and time. He appears well-developed and well-nourished.  HENT:  Head: Normocephalic and atraumatic.  Eyes: Pupils are equal, round, and reactive to light. Conjunctivae are normal.  Neck: Normal range of motion.  Neurological: He is alert and oriented to person, place, and time.  Psychiatric: He has a normal mood and affect.  Nursing note and vitals reviewed.   Impinges on right side at approximately 120 of flexion Pain with external rotation of the shoulder on the right side motor strength is 5 minus in the right deltoid, biceps, triceps, grip, hip flexor, knee extensor, ankle dorsiflexor. 5/5 left side     Assessment & Plan:  1.  Left MCA infarct with right hemiparesis, overall very good recovery. He is at a modified independent level, we discussed that his job is very demanding physically and I was not sure whether he can get back to doing this on a full-time basis.  2. 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  Informed consent was obtained after describing risks and benefits of the procedure with the patient, this includes bleeding, bruising, infection and medication side effects. The patient wishes to proceed and has given written consent. Patient was placed in a seated position. TheRight shoulder was marked and prepped with betadine in the subacromial area. A 25-gauge 1-1/2 inch needle was inserted into the subacromial area. After negative draw back for blood, a solution containing 1 mL of 6 mg per ML betamethasone and 4 mL of 1% lidocaine was injected. A band aid was applied. The patient tolerated the procedure well. Post procedure instructions were  given.

## 2016-11-22 NOTE — Patient Instructions (Signed)
Capsulitis adhesiva  (Adhesive Capsulitis)  La capsulitis adhesiva es la inflamacin de los tendones y los ligamentos que rodean la articulacin del hombro (cpsula del hombro). Esta afeccin causa rigidez en el hombro y dolor al moverlo. A la capsulitis adhesiva tambin se la conoce como hombro congelado.  CAUSAS  Esta afeccin puede ser causada por lo siguiente:   Una lesin en la articulacin del hombro.   Un esfuerzo con el hombro.   No mover el hombro durante un tiempo. Esto puede ocurrir si la persona sufri una lesin en el brazo o si tuvo el brazo en un cabestrillo.   Problemas de salud prolongados, por ejemplo:  ? Diabetes.  ? Problemas de tiroides.  ? Cardiopata.  ? Ictus.  ? Artritis reumatoide.  ? Enfermedad pulmonar.  En algunos casos, es posible que la causa no se conozca.  FACTORES DE RIESGO  Es ms probable que esta afeccin se manifieste en:   Las mujeres.   Las personas mayores de 40aos.  SNTOMAS  Los sntomas de esta afeccin incluyen lo siguiente:   Dolor en el hombro al mover el brazo. El dolor tambin puede presentarse al tocar partes del hombro. El dolor es ms intenso durante la noche o cuando descansa.   Molestia o dolor en el hombro.   Imposibilidad de mover el hombro normalmente.   Espasmos musculares.  DIAGNSTICO  Esta afeccin se diagnostica mediante un examen fsico y estudios de diagnstico por imgenes, como una radiografa o una resonancia magntica (RM).  TRATAMIENTO  El tratamiento de esta afeccin puede incluir lo siguiente:   Tratamiento de la causa o la afeccin preexistentes.   Fisioterapia. Incluye ejercicios para recuperar el movimiento del hombro.   Medicamentos. Se pueden administrar medicamentos para aliviar el dolor y reducir la inflamacin o los espasmos musculares.   Inyecciones de corticoides en la articulacin del hombro.   Manipulacin del hombro. Este es un procedimiento que se realiza para cambiar la posicin del hombro. Para este procedimiento  le administrarn un medicamento para hacerlo dormir (anestesia general). Tambin pueden inyectarle en la articulacin agua salada (solucin salina) a alta presin para romper las adherencias.   Ciruga. Se puede realizar en los casos graves cuando otros tratamientos no han sido eficaces.  Aunque la mayora de las personas se recuperan totalmente de la capsulitis adhesiva, es posible que algunas no recuperen la movilidad total del hombro.  INSTRUCCIONES PARA EL CUIDADO EN EL HOGAR   Tome los medicamentos de venta libre y los recetados solamente como se lo haya indicado el mdico.   Si el tratamiento incluye la fisioterapia, siga las indicaciones del fisioterapeuta.   Evite los ejercicios que sobreexigen el hombro, por ejemplo, los lanzamientos. Estos ejercicios pueden intensificar el dolor.   Si se lo indican, aplique hielo sobre la zona lesionada:  ? Ponga el hielo en una bolsa plstica.  ? Coloque una toalla entre la piel y la bolsa de hielo.  ? Coloque el hielo durante 20minutos, 2 a 3veces por da.  SOLICITE ATENCIN MDICA SI:   Presenta nuevos sntomas.   Los sntomas empeoran.  Esta informacin no tiene como fin reemplazar el consejo del mdico. Asegrese de hacerle al mdico cualquier pregunta que tenga.  Document Released: 05/21/2012 Document Revised: 10/15/2014 Document Reviewed: 05/19/2014  Elsevier Interactive Patient Education  2018 [employer]

## 2016-11-25 ENCOUNTER — Encounter: Payer: Self-pay | Admitting: Occupational Therapy

## 2016-11-29 ENCOUNTER — Ambulatory Visit: Payer: Self-pay | Admitting: Occupational Therapy

## 2016-11-29 DIAGNOSIS — I69351 Hemiplegia and hemiparesis following cerebral infarction affecting right dominant side: Secondary | ICD-10-CM

## 2016-11-29 DIAGNOSIS — M25611 Stiffness of right shoulder, not elsewhere classified: Secondary | ICD-10-CM

## 2016-11-29 DIAGNOSIS — R278 Other lack of coordination: Secondary | ICD-10-CM

## 2016-11-29 DIAGNOSIS — M25511 Pain in right shoulder: Secondary | ICD-10-CM

## 2016-11-29 MED FILL — ?CLOPIDOGREL 75MG TAB: 75 | 30 days supply | Qty: 30 | Fill #2

## 2016-11-29 NOTE — Therapy (Signed)
[facility] [address] [place], [place], [address] Phone: [phone]   Fax:  [phone]  Occupational Therapy Treatment  Patient Details  Name: James Cortez MRN: [id number] Date of Birth: July 20, 1965 Referring Provider: Dr. Arnoldo Morale  Encounter Date: 11/29/2016      OT End of Session - 11/29/16 0853    Visit Number 28   Number of Visits 32  [id number]+4=32   Date for OT Re-Evaluation 12/20/16   Authorization Type [employer] 10/08/16-01/07/17   Authorization Time Period  renewal completed 11/29/16 for 4 wks   OT Start Time 0849   OT Stop Time 0930   OT Time Calculation (min) 41 min   Activity Tolerance Patient tolerated treatment well   Behavior During Therapy [facility] for tasks assessed/performed      Past Medical History:  Diagnosis Date  . Skin cancer     Past Surgical History:  Procedure Laterality Date  . EP IMPLANTABLE DEVICE N/A 12/15/2015   Procedure: Loop Recorder Insertion;  Surgeon: Will Meredith Leeds, MD;  Location: [facility];  Service: Cardiovascular;  Laterality: N/A;  . IR GENERIC HISTORICAL  12/08/2015   IR PERCUTANEOUS ART THROMBECTOMY/INFUSION INTRACRANIAL INC DIAG ANGIO 12/08/2015 Luanne Bras, MD [facility]-INTERV RAD  . RADIOLOGY WITH ANESTHESIA Right 12/08/2015   Procedure: RADIOLOGY WITH ANESTHESIA - CODE STROKE;  Surgeon: Medication Radiologist, MD;  Location: [facility];  Service: Radiology;  Laterality: Right;  . SKIN SURGERY    . TEE WITHOUT CARDIOVERSION N/A 12/15/2015   Procedure: TRANSESOPHAGEAL ECHOCARDIOGRAM (TEE);  Surgeon: Sueanne Margarita, MD;  Location: [facility] ENDOSCOPY;  Service: Cardiovascular;  Laterality: N/A;    There were no vitals filed for this visit.      Subjective Assessment - 11/29/16 0853    Subjective  no pain since injection.  Pt reports that he has started working in [employer] for about a week with no pain.   Patient is accompained by: Interpreter   Pertinent History L MCA distribution infarct, aphasia, R hemiparesis, R hand dupuytren's contracture, HTN   Limitations native Spanish speaking, uses interpreter   Patient Stated Goals improve RUE pain and functional use, return to work   Currently in Pain? No/denies         In sitting, AAROM shoulder flex and abduction with ball with BUEs with min cueing for proper positioning. Then cane ex for shoulder abduction stretch with min v.c. For positioning.  In standing, shoulder flex AAROM on wall with ball with BUEs for stretch.    ER stretch in doorframe x10sec x5.  In prone, scapular ex with shoulders in ext  x15, min cueing initially.  Attempted in abduction, but unable due to pain.  Able to retrieve/replace 2lb object on overhead shelf x10 without pain.  Picking up 20lbs with BUEs from floor to waist height and replacing x5.  Then ambulating while carrying 20lbs approx 229f to place at chest height.  Then carrying 20 feet to replace on floor without pain.  Min cueing for proper body mechanics.  In quadruped, lifting LUE for incr wt. Bearing on RUE x10 with 5sec hold.  Arm bike x6 min level 7 for reciprocal movement/conditioning without pain/rest.                       OT Education - 11/29/16 0915    Education Details Reviewed red theraband HEP--pt returned demo x15 each   Person(s) Educated Patient   Methods Explanation;Demonstration   Comprehension  Verbalized understanding;Returned demonstration          OT Short Term Goals - 11/01/16 0843      OT SHORT TERM GOAL #1   Title Pt will be independent with initial HEP.--check STGs 08/30/16   Time 4   Period Weeks   Status Achieved     OT SHORT TERM GOAL #2   Title Pt will report pain less than 6/10 for light BADLs.   Baseline 10/10 with movement   Time 4   Period Weeks   Status Achieved  08/23/16     OT SHORT TERM GOAL #3   Title Pt will demo at least 105* R shoulder flex for light functional  reaching.   Baseline 95*   Time 4   Period Weeks   Status Achieved  08/23/16  70* without pain and good positioning.  09/20/16:  110* without pain     OT SHORT TERM GOAL #4   Title Pt will demo at least 50* R shoulder abduction for lateral reaching/ADLs.--check remaining STGs 10/27/16   Baseline 35*   Time 4   Period Weeks   Status Achieved  08/23/16  35* without pain and good position.  09/27/16:  40* without pain and good positioning.  11/01/16  up to 150* with 2/10 pain with proper positioning/cueing     OT SHORT TERM GOAL #5   Title Pt will verbalize understanding of proper positioning of RUE/adaptions for ADLs to decr RUE pain.   Time 4   Period Weeks   Status Achieved  08/23/16, but continues to need min cueing at times     OT SHORT TERM GOAL #6   Title Continue with LTG#3 as STG   Time 4   Period Weeks   Status Achieved  11/01/16           OT Long Term Goals - 11/29/16 0854      OT LONG TERM GOAL #1   Title Pt will be independent with updated HEP.--check LTGs 11/26/16   Time 8   Period Weeks   Status Achieved  09/27/16:  met with current, but will benefit from additional updates.  11/29/16:  met     OT LONG TERM GOAL #2   Title Pt will report pain less than 4/10 for light ADLs/IADLs.   Baseline 10/10 with movement   Time 8   Period Weeks   Status Achieved  09/27/16:  2/10 or less     OT LONG TERM GOAL #3   Title Pt will demo at least 120* R shoulder flex for light functional reaching.--continue as STG   Baseline 95*   Time 8   Period Weeks   Status Achieved  09/27/16:  115* without pain.  10/22/16  160* without pain     OT LONG TERM GOAL #4   Title Pt will demo at least 70* R shoulder abduction for lateral reaching/ADLs.---Updated 11/01/16:  Pt will demo at least 120* R shoulder abduction without pain or cueing.   Baseline 35*   Time 8   Period Weeks   Status Achieved  09/27/16:  40* without pain.  11/01/16  met initial goal 100* without pain, but able to  achieve 150* with min cueing for proper positioning with 2/10 pain     OT LONG TERM GOAL #5   Title Pt will improve R grip strength to at least 40lbs for gripping/lifting tasks.   Baseline R-35lbs   Time 8   Period Weeks   Status Achieved  09/27/16:  24lbs.  11/01/16:  28lbs.  11/21/16:  42lbs     OT LONG TERM GOAL #6   Title Pt will improve R hand coordination for ADLs as shown by completing 9-hole peg test in 30sec or less.   Baseline 37.25sec   Time 8   Period Weeks   Status Achieved  09/27/16:  47.97sec with shoulder compensations.  11/21/16:  26.50sec     OT LONG TERM GOAL #7   Title Pt will be able to lift 2lb object from overhead shelf without pain with RUE.   Baseline unable   Time 8   Period Weeks   Status Achieved  11/29/16     OT LONG TERM GOAL #8   Title Pt will be independent with final HEP for strengthening--check goals  12/30/16   Time 4   Period Weeks   Status New     OT LONG TERM GOAL  #9   Baseline Pt will be able to lift 4lb object from overhead shelf without pain with RUE x10 for improved strength for work.   Time 4   Period Weeks   Status New     OT LONG TERM GOAL  #10   TITLE Pt will be able to lift at least 40lbs with BUEs from floor to chest height and carry at least 18f x5 without pain.   Time 4   Period Weeks   Status New     OT LONG TERM GOAL  #11   TITLE Pt will improve R grip strength to at least 50lbs for gripping/lifting tasks.   Time 4   Period Weeks   Status New               Plan - 11/29/16 07096   Clinical Impression Statement Pt has made good progress.  However, pt would benefit from continued occupational therapy at 1x week to progress strengthing HEP, monitor for proper positioning to prevent re-injury, and simulated work tasks as pt is beginning to return to work.   Rehab Potential Good   OT Frequency 1x / week   OT Duration 4 weeks   OT Treatment/Interventions Self-care/ADL training;Cryotherapy;Parrafin;Therapeutic  exercise;DME and/or AE instruction;Functional Mobility Training;Manual Therapy;Neuromuscular education;Splinting;Fluidtherapy;Ultrasound;Electrical Stimulation;Moist Heat;Contrast Bath;Energy conservation;Passive range of motion;Therapeutic exercises;Therapeutic activities;Patient/family education   Plan renewal completed 11/29/16; progress strengthening as able, simulated work tasks.   OT Home Exercise Plan Education provided:  HEP, proper positiong of RUE, theraband HEP   Consulted and Agree with Plan of Care Patient      Patient will benefit from skilled therapeutic intervention in order to improve the following deficits and impairments:  Decreased activity tolerance, Decreased coordination, Decreased knowledge of use of DME, Decreased strength, Impaired UE functional use, Pain, Improper body mechanics, Decreased range of motion, Decreased mobility, Impaired vision/preception, Decreased balance  Visit Diagnosis: Hemiplegia and hemiparesis following cerebral infarction affecting right dominant side (HCC)  Right shoulder pain, unspecified chronicity  Stiffness of right shoulder, not elsewhere classified  Other lack of coordination    Problem List Patient Active Problem List   Diagnosis Date Noted  . Adhesive capsulitis of left shoulder 10/11/2016  . Dupuytren's contracture of right hand 02/18/2016  . Gait disturbance, post-stroke 01/04/2016  . Hemiparesis affecting right side as late effect of cerebrovascular accident (CVA) ([facility] 01/04/2016  . Cerebral infarction due to thrombosis of left middle cerebral artery ([place] 12/15/2015  . Left middle cerebral artery stroke ([facility] 12/15/2015  . Oropharyngeal dysphagia   . Aphasia as late effect  of stroke   . Right hemiplegia ([facility])   . Acute on chronic respiratory failure ([place])   . Essential hypertension   . CVA (cerebral vascular accident) ([place]) 12/08/2015    [facility] 11/29/2016, 11:42 AM  [facility] [address] [place], [place], [address] Phone: [phone]   Fax:  [phone]  Name: James Cortez MRN: [id number] Date of Birth: [birth year]   Vianne Bulls, OTR/L [facility] [address], [place]  [address] [phone] phone [phone] 11/29/16 11:42 AM

## 2016-12-07 ENCOUNTER — Ambulatory Visit: Payer: Self-pay | Admitting: Occupational Therapy

## 2016-12-07 DIAGNOSIS — M25511 Pain in right shoulder: Secondary | ICD-10-CM

## 2016-12-07 DIAGNOSIS — R278 Other lack of coordination: Secondary | ICD-10-CM

## 2016-12-07 DIAGNOSIS — I69351 Hemiplegia and hemiparesis following cerebral infarction affecting right dominant side: Secondary | ICD-10-CM

## 2016-12-07 DIAGNOSIS — M25611 Stiffness of right shoulder, not elsewhere classified: Secondary | ICD-10-CM

## 2016-12-07 NOTE — Therapy (Signed)
[facility] [address] [place], [place], [address] Phone: [phone]   Fax:  [phone]  Occupational Therapy Treatment  Patient Details  Name: James Cortez MRN: [id number] Date of Birth: Apr 22, 1965 Referring Provider: Dr. Arnoldo Morale  Encounter Date: 12/07/2016      OT End of Session - 12/07/16 0839    Visit Number 29   Number of Visits 32   Date for OT Re-Evaluation 12/20/16   Authorization Type [employer]FA 10/08/16-01/07/17   Authorization Time Period  renewal completed 11/29/16 for 4 wks   OT Start Time 0804   OT Stop Time 0845   OT Time Calculation (min) 41 min      Past Medical History:  Diagnosis Date  . Skin cancer     Past Surgical History:  Procedure Laterality Date  . EP IMPLANTABLE DEVICE N/A 12/15/2015   Procedure: Loop Recorder Insertion;  Surgeon: Will Meredith Leeds, MD;  Location: [facility];  Service: Cardiovascular;  Laterality: N/A;  . IR GENERIC HISTORICAL  12/08/2015   IR PERCUTANEOUS ART THROMBECTOMY/INFUSION INTRACRANIAL INC DIAG ANGIO 12/08/2015 Luanne Bras, MD [facility]INTER[facility]  . RADIOLOGY WITH ANESTHESIA Right 12/08/2015   Procedure: RADIOLOGY WITH ANESTHESIA - CODE STROKE;  Surgeon: Medication Radiologist, MD;  Location: [facility];  Service: Radiology;  Laterality: Right;  . SKIN SURGERY    . TEE WITHOUT CARDIOVERSION N/A 12/15/2015   Procedure: TRANSESOPHAGEAL ECHOCARDIOGRAM (TEE);  Surgeon: Sueanne Margarita, MD;  Location: [facility] ENDOSCOPY;  Service: Cardiovascular;  Laterality: N/A;    There were no vitals filed for this visit.      Subjective Assessment - 12/07/16 0841    Subjective  Denies pain on arrival   Patient Stated Goals improve RUE pain and functional use, return to work   Currently in Pain? No/denies                   In sitting, AAROM shoulder flex and abduction with ball with BUEs with min cueing for proper positioning. .  In  standing, shoulder flex AAROM on wall with ball with BUEs for stretch.    ER stretch in doorframe x10sec x5.  In prone, scapular ex with shoulders in ext  x15, min cueing initially.    Picking up 20lbs with BUEs from floor to waist height and replacing 2 sets of  x5.  Then ambulating while carrying 20lbs approx 275f x 2 to place at chest height.  Min cueing for proper body mechanics.  In quadruped, cat/ cow then rock forwards and backwards  forincr wt. Bearing on RUE x10 with 5sec hold.  Arm bike x6 min level 5 for reciprocal movement/conditioning without pain/rest.                   OT Education - 12/07/16 0838    Education provided Yes   Education Details Red theraband exercises(shoulder horizontal abduction, rowing, shoulder ext, and shoulder flexion)x 15 res each, green theraputty HEP issued for composite grip and tip pinch 10-20 reps each   Person(s) Educated Patient   Methods Explanation;Demonstration;Verbal cues;Handout   Comprehension Verbalized understanding;Returned demonstration;Verbal cues required          OT Short Term Goals - 11/01/16 0843      OT SHORT TERM GOAL #1   Title Pt will be independent with initial HEP.--check STGs 08/30/16   Time 4   Period Weeks   Status Achieved     OT SHORT TERM GOAL #2  Title Pt will report pain less than 6/10 for light BADLs.   Baseline 10/10 with movement   Time 4   Period Weeks   Status Achieved  08/23/16     OT SHORT TERM GOAL #3   Title Pt will demo at least 105* R shoulder flex for light functional reaching.   Baseline 95*   Time 4   Period Weeks   Status Achieved  08/23/16  70* without pain and good positioning.  09/20/16:  110* without pain     OT SHORT TERM GOAL #4   Title Pt will demo at least 50* R shoulder abduction for lateral reaching/ADLs.--check remaining STGs 10/27/16   Baseline 35*   Time 4   Period Weeks   Status Achieved  08/23/16  35* without pain and good position.  09/27/16:  40*  without pain and good positioning.  11/01/16  up to 150* with 2/10 pain with proper positioning/cueing     OT SHORT TERM GOAL #5   Title Pt will verbalize understanding of proper positioning of RUE/adaptions for ADLs to decr RUE pain.   Time 4   Period Weeks   Status Achieved  08/23/16, but continues to need min cueing at times     OT SHORT TERM GOAL #6   Title Continue with LTG#3 as STG   Time 4   Period Weeks   Status Achieved  11/01/16           OT Long Term Goals - 11/29/16 0854      OT LONG TERM GOAL #1   Title Pt will be independent with updated HEP.--check LTGs 11/26/16   Time 8   Period Weeks   Status Achieved  09/27/16:  met with current, but will benefit from additional updates.  11/29/16:  met     OT LONG TERM GOAL #2   Title Pt will report pain less than 4/10 for light ADLs/IADLs.   Baseline 10/10 with movement   Time 8   Period Weeks   Status Achieved  09/27/16:  2/10 or less     OT LONG TERM GOAL #3   Title Pt will demo at least 120* R shoulder flex for light functional reaching.--continue as STG   Baseline 95*   Time 8   Period Weeks   Status Achieved  09/27/16:  115* without pain.  10/22/16  160* without pain     OT LONG TERM GOAL #4   Title Pt will demo at least 70* R shoulder abduction for lateral reaching/ADLs.---Updated 11/01/16:  Pt will demo at least 120* R shoulder abduction without pain or cueing.   Baseline 35*   Time 8   Period Weeks   Status Achieved  09/27/16:  40* without pain.  11/01/16  met initial goal 100* without pain, but able to achieve 150* with min cueing for proper positioning with 2/10 pain     OT LONG TERM GOAL #5   Title Pt will improve R grip strength to at least 40lbs for gripping/lifting tasks.   Baseline R-35lbs   Time 8   Period Weeks   Status Achieved  09/27/16:  24lbs.  11/01/16:  28lbs.  11/21/16:  42lbs     OT LONG TERM GOAL #6   Title Pt will improve R hand coordination for ADLs as shown by completing 9-hole peg  test in 30sec or less.   Baseline 37.25sec   Time 8   Period Weeks   Status Achieved  09/27/16:  47.97sec with shoulder  compensations.  11/21/16:  26.50sec     OT LONG TERM GOAL #7   Title Pt will be able to lift 2lb object from overhead shelf without pain with RUE.   Baseline unable   Time 8   Period Weeks   Status Achieved  11/29/16     OT LONG TERM GOAL #8   Title Pt will be independent with final HEP for strengthening--check goals  12/30/16   Time 4   Period Weeks   Status New     OT LONG TERM GOAL  #9   Baseline Pt will be able to lift 4lb object from overhead shelf without pain with RUE x10 for improved strength for work.   Time 4   Period Weeks   Status New     OT LONG TERM GOAL  #10   TITLE Pt will be able to lift at least 40lbs with BUEs from floor to chest height and carry at least 49f x5 without pain.   Time 4   Period Weeks   Status New     OT LONG TERM GOAL  #11   TITLE Pt will improve R grip strength to at least 50lbs for gripping/lifting tasks.   Time 4   Period Weeks   Status New               Plan - 12/07/16 0840    Clinical Impression Statement Pt demonstrates good overall progress with improved strength and decreased pain.   Rehab Potential Good   OT Frequency 1x / week   OT Duration 4 weeks   OT Treatment/Interventions Self-care/ADL training;Cryotherapy;Parrafin;Therapeutic exercise;DME and/or AE instruction;Functional Mobility Training;Manual Therapy;Neuromuscular education;Splinting;Fluidtherapy;Ultrasound;Electrical Stimulation;Moist Heat;Contrast Bath;Energy conservation;Passive range of motion;Therapeutic exercises;Therapeutic activities;Patient/family education   Plan continue to progress strengthening/ simulated work activities   OKaltagEducation provided:  HEP, proper positiong of RUE, theraband HEP, green theraputty   Consulted and Agree with Plan of Care Patient      Patient will benefit from skilled therapeutic  intervention in order to improve the following deficits and impairments:  Decreased activity tolerance, Decreased coordination, Decreased knowledge of use of DME, Decreased strength, Impaired UE functional use, Pain, Improper body mechanics, Decreased range of motion, Decreased mobility, Impaired vision/preception, Decreased balance  Visit Diagnosis: Hemiplegia and hemiparesis following cerebral infarction affecting right dominant side (HCC)  Right shoulder pain, unspecified chronicity  Stiffness of right shoulder, not elsewhere classified  Other lack of coordination    Problem List Patient Active Problem List   Diagnosis Date Noted  . Adhesive capsulitis of left shoulder 10/11/2016  . Dupuytren's contracture of right hand 02/18/2016  . Gait disturbance, post-stroke 01/04/2016  . Hemiparesis affecting right side as late effect of cerebrovascular accident (CVA) ([facility] 01/04/2016  . Cerebral infarction due to thrombosis of left middle cerebral artery ([place] 12/15/2015  . Left middle cerebral artery stroke ([facility] 12/15/2015  . Oropharyngeal dysphagia   . Aphasia as late effect of stroke   . Right hemiplegia ([facility]   . Acute on chronic respiratory failure ([facility]   . Essential hypertension   . CVA (cerebral vascular accident) ([facility] 12/08/2015    Iness Pangilinan 12/07/2016, 8:43 AM KTheone Murdoch OTR/L [phone]: [phone] AM 12/07/16 [facility]New [address]SC[address]GR[place] [address]Phone: [phone]  Fax:  [phone] Name: JTavita EasthamMRN: [id number] of Birth: [birth year]

## 2016-12-09 ENCOUNTER — Ambulatory Visit (INDEPENDENT_AMBULATORY_CARE_PROVIDER_SITE_OTHER): Payer: Self-pay | Admitting: *Deleted

## 2016-12-09 DIAGNOSIS — I639 Cerebral infarction, unspecified: Secondary | ICD-10-CM

## 2016-12-12 LAB — CUP PACEART REMOTE DEVICE CHECK
Date Time Interrogation Session: 20181103000730
Implantable Pulse Generator Implant Date: 20171107

## 2016-12-12 NOTE — Progress Notes (Signed)
Carelink Summary Report / Loop Recorder 

## 2016-12-14 ENCOUNTER — Ambulatory Visit: Payer: Self-pay | Attending: Family Medicine | Admitting: Occupational Therapy

## 2016-12-14 DIAGNOSIS — I69351 Hemiplegia and hemiparesis following cerebral infarction affecting right dominant side: Secondary | ICD-10-CM | POA: Insufficient documentation

## 2016-12-14 DIAGNOSIS — M25611 Stiffness of right shoulder, not elsewhere classified: Secondary | ICD-10-CM | POA: Insufficient documentation

## 2016-12-14 DIAGNOSIS — M25511 Pain in right shoulder: Secondary | ICD-10-CM | POA: Insufficient documentation

## 2016-12-18 IMAGING — CT CT HEAD W/O CM
4 series · 16 of 47 positions shown, 18 images · non-contrast
Comparison: 12/08/2015 CT head.

CLINICAL DATA: 50 y/o  M; left M1 occlusion revascularization.

EXAM:
CT HEAD WITHOUT CONTRAST
TECHNIQUE: Contiguous axial images were obtained from the base of the skull
through the vertex without intravenous contrast.

[Series 2: head without · axial · non-contrast · 0.43mm/px · z∈[-109,+11]mm · 7 of 33 slices shown, 9 images]
[im 5/33  brain]
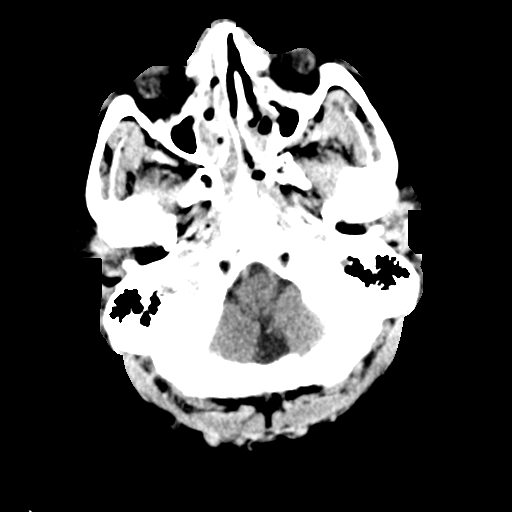
[im 5/33  bone]
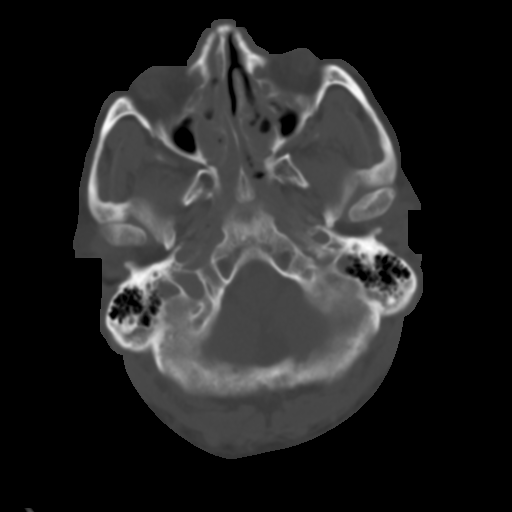
[im 9/33  brain]
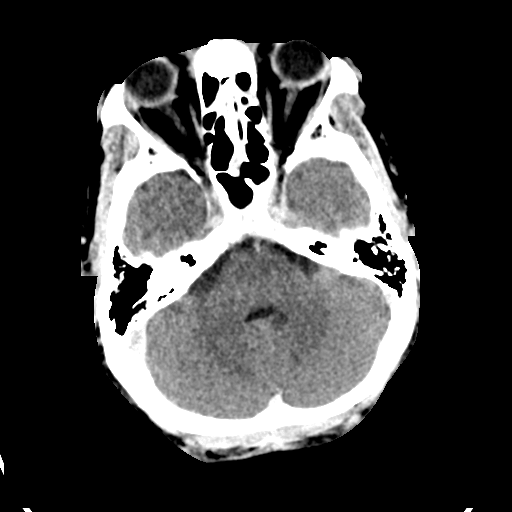
[im 13/33  brain]
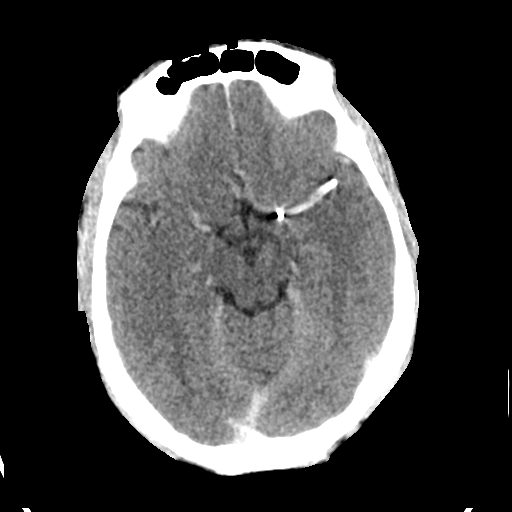
[im 17/33  brain]
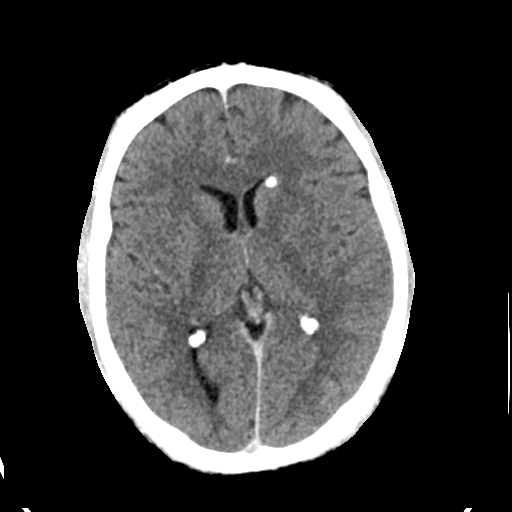
[im 21/33  brain]
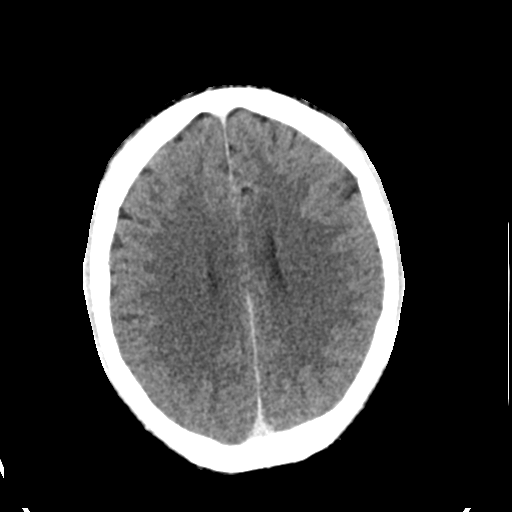
[im 21/33  bone]
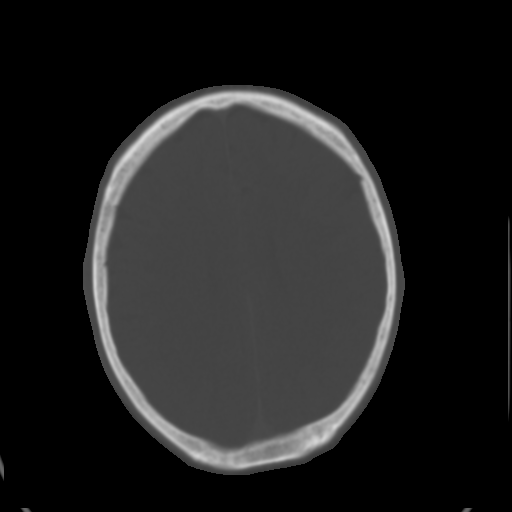
[im 25/33  brain]
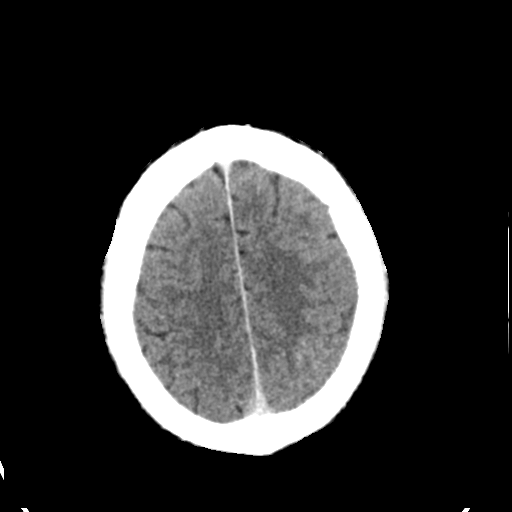
[im 29/33  brain]
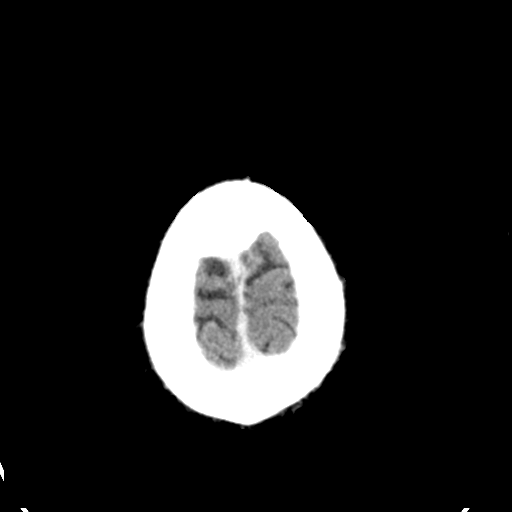

[Series 3: head bone · axial · 0.43mm/px · z∈[-113,-81]mm · 3 of 83 slices shown]
[im 9/83  bone]
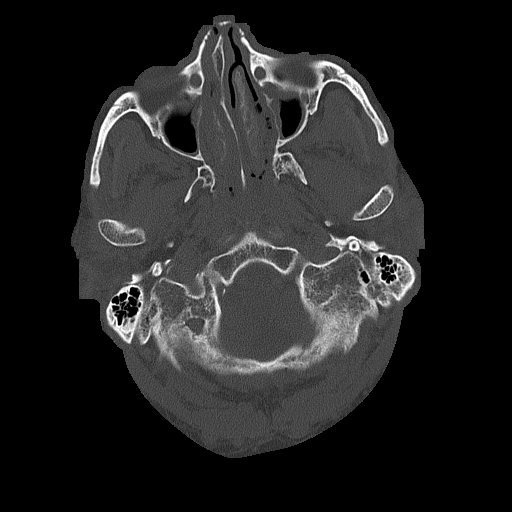
[im 17/83  bone]
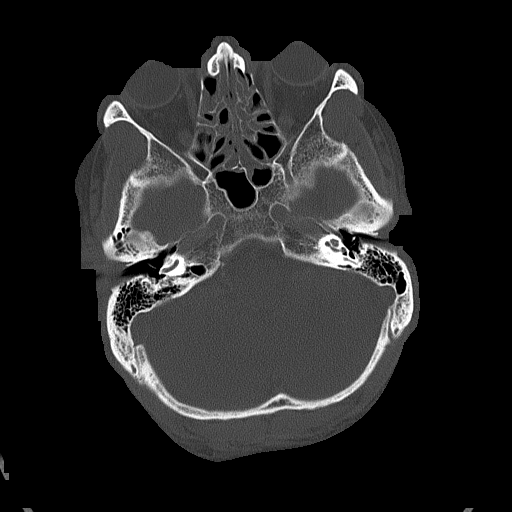
[im 25/83  bone]
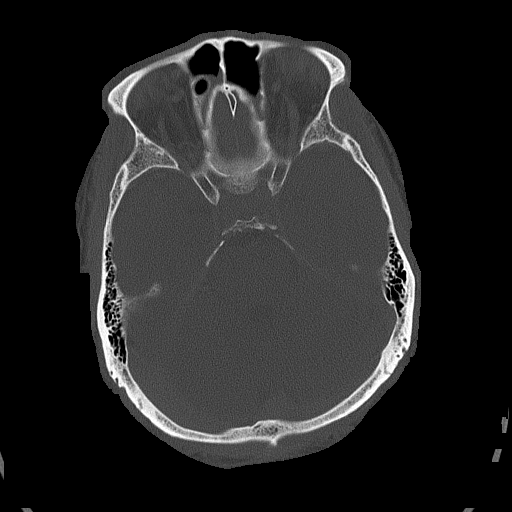

[Series 4: head without cor · coronal · non-contrast · 0.31mm/px · 3 of 67 slices shown]
[im 23/67  brain]
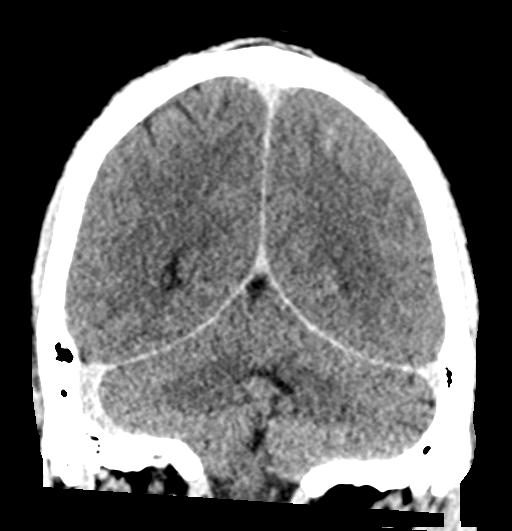
[im 30/67  brain]
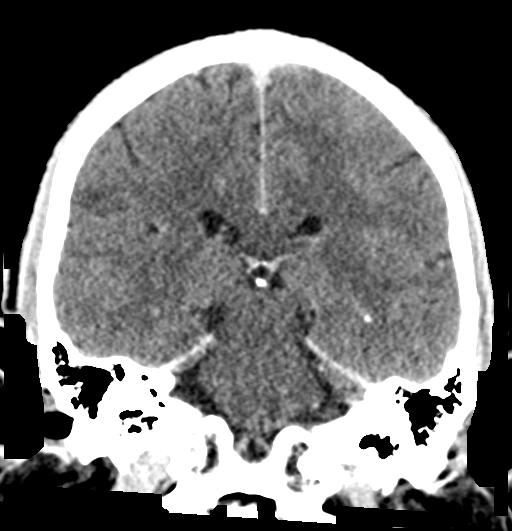
[im 37/67  brain]
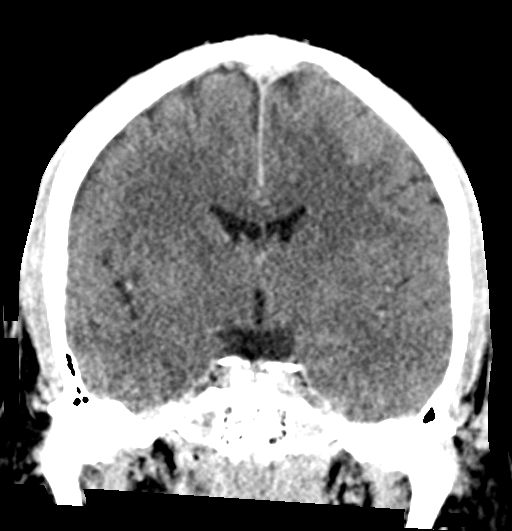

[Series 5: head without sag · sagittal · non-contrast · 0.32mm/px · 3 of 56 slices shown]
[im 19/56  brain]
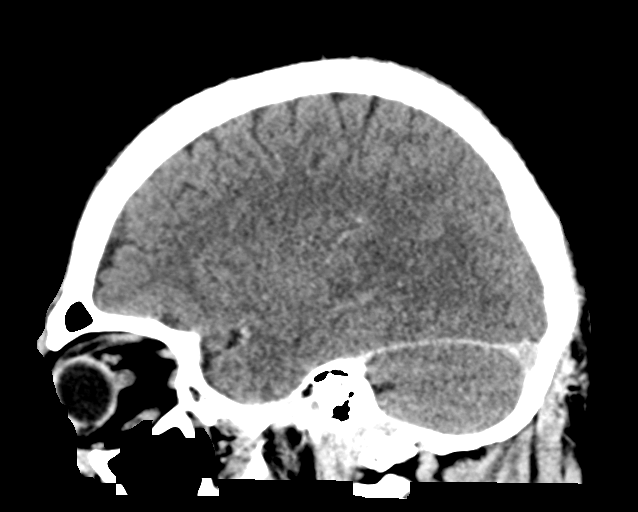
[im 28/56  brain]
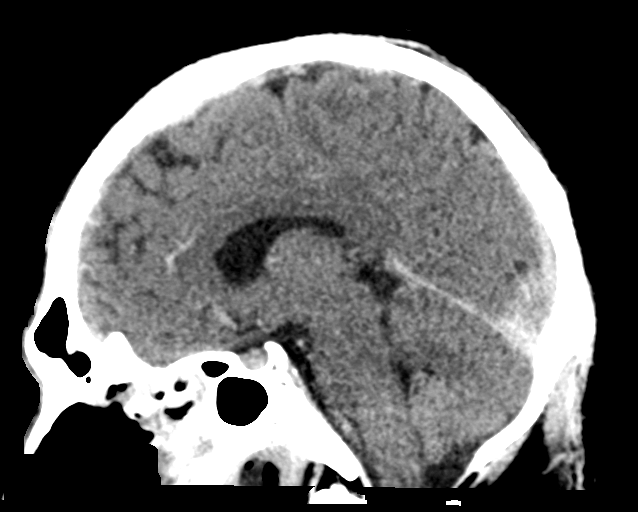
[im 37/56  brain]
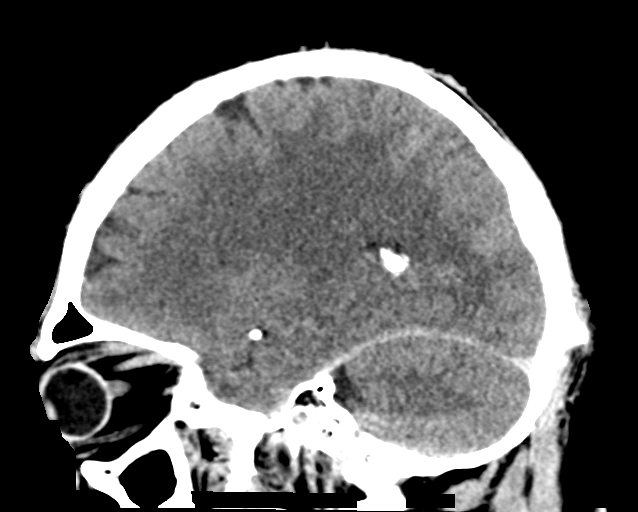

[16 of 47 positions shown; findings below may reference images not displayed]

FINDINGS: Brain: Stable calcification in the left frontal periventricular
white matter. No evidence for large territory infarct, focal mass
effect, or brain parenchymal hemorrhage. Increased density within
sulci in the left parietal and lateral temporal lobe in comparison
with the contralateral hemisphere is probably due to vessel
engorgement and hyperemia post revascularization. Less likely
subarachnoid hemorrhage.

Vascular: Left M1 segment stent.

Skull: Normal. Negative for fracture or focal lesion.

Sinuses/Orbits: Moderate diffuse paranasal sinus mucosal thickening.
Normal pneumatization of mastoid air cells. Orbits are unremarkable.

Other: None.
IMPRESSION: 1. No infarct or brain parenchymal hemorrhage identified.
2. Increased density within sulci in the left parietal and lateral
temporal lobe in comparison with the contralateral hemisphere is
probably due to vessel engorgement and hyperemia post
revascularization, less likely subarachnoid hemorrhage.

By: Paulus N Ceejay M.D.

## 2016-12-20 ENCOUNTER — Encounter: Payer: Self-pay | Admitting: Occupational Therapy

## 2016-12-20 ENCOUNTER — Ambulatory Visit: Payer: Self-pay | Admitting: Occupational Therapy

## 2016-12-20 DIAGNOSIS — M25611 Stiffness of right shoulder, not elsewhere classified: Secondary | ICD-10-CM

## 2016-12-20 DIAGNOSIS — I69351 Hemiplegia and hemiparesis following cerebral infarction affecting right dominant side: Secondary | ICD-10-CM

## 2016-12-20 DIAGNOSIS — M25511 Pain in right shoulder: Secondary | ICD-10-CM

## 2016-12-20 NOTE — Therapy (Signed)
[facility] [address] [place], [place], [address] Phone: [phone]   Fax:  [phone]  Occupational Therapy Treatment  Patient Details  Name: James Cortez MRN: [id number] Date of Birth: 05-Mar-1965 Referring Provider: Dr. Arnoldo Morale   Encounter Date: 12/20/2016  OT End of Session - 12/20/16 1030    Visit Number  30    Number of Visits  32    Date for OT Re-Evaluation  12/20/16    Authorization Type  [employer] 10/08/16-01/07/17    Authorization Time Period   renewal completed 11/29/16 for 4 wks       Past Medical History:  Diagnosis Date  . Skin cancer     Past Surgical History:  Procedure Laterality Date  . IR GENERIC HISTORICAL  12/08/2015   IR PERCUTANEOUS ART THROMBECTOMY/INFUSION INTRACRANIAL INC DIAG ANGIO 12/08/2015 Luanne Bras, MD [facility]  . SKIN SURGERY      There were no vitals filed for this visit.  Subjective Assessment - 12/20/16 1029    Subjective   Denies pain on arrival    Patient is accompained by:  Interpreter    Pertinent History  L MCA distribution infarct, aphasia, R hemiparesis, R hand dupuytren's contracture, HTN    Limitations  native Spanish speaking, uses interpreter    Patient Stated Goals  improve RUE pain and functional use, return to work    Currently in Pain?  No/denies    Pain Score  0-No pain         [facility] OT Assessment - 12/20/16 0001      Hand Function   Right Hand Grip (lbs)  52, 60, 60    Left Hand Grip (lbs)  98               OT Treatments/Exercises (OP) - 12/20/16 0001      ADLs   ADL Comments  Reports some pain when he sleeps on right shoulder.  Reviewed bed positioning that seemed to aleviate shoulder discomfort.        Neurological Re-education Exercises   Other Exercises 1  UBE x 5 min at level 10.      Hand Gripper with Large Beads  Reaching for prolonged grip from sit to stand and high reach on level 3 25 blocks  without difficulty, on level 4 increased dropping after 20 blocks.               OT Education - 12/20/16 1030    Education provided  Yes       OT Short Term Goals - 11/01/16 0843      OT SHORT TERM GOAL #1   Title  Pt will be independent with initial HEP.--check STGs 08/30/16    Time  4    Period  Weeks    Status  Achieved      OT SHORT TERM GOAL #2   Title  Pt will report pain less than 6/10 for light BADLs.    Baseline  10/10 with movement    Time  4    Period  Weeks    Status  Achieved 08/23/16      OT SHORT TERM GOAL #3   Title  Pt will demo at least 105* R shoulder flex for light functional reaching.    Baseline  95*    Time  4    Period  Weeks    Status  Achieved 08/23/16  70* without pain and good positioning.  09/20/16:  110* without pain      OT SHORT TERM GOAL #4   Title  Pt will demo at least 50* R shoulder abduction for lateral reaching/ADLs.--check remaining STGs 10/27/16    Baseline  35*    Time  4    Period  Weeks    Status  Achieved 08/23/16  35* without pain and good position.  09/27/16:  40* without pain and good positioning.  11/01/16  up to 150* with 2/10 pain with proper positioning/cueing      OT SHORT TERM GOAL #5   Title  Pt will verbalize understanding of proper positioning of RUE/adaptions for ADLs to decr RUE pain.    Time  4    Period  Weeks    Status  Achieved 08/23/16, but continues to need min cueing at times      OT SHORT TERM GOAL #6   Title  Continue with LTG#3 as STG    Time  4    Period  Weeks    Status  Achieved 11/01/16        OT Long Term Goals - 12/20/16 0924      OT LONG TERM GOAL #8   Title  Pt will be independent with final HEP for strengthening--check goals  12/30/16    Status  Achieved      OT LONG TERM GOAL  #9   Baseline  Pt will be able to lift 4lb object from overhead shelf without pain with RUE x10 for improved strength for work.    Status  Achieved      OT LONG TERM GOAL  #10   TITLE  Pt will be able to  lift at least 40lbs with BUEs from floor to chest height and carry at least 56f x5 without pain.    Status  Achieved      OT LONG TERM GOAL  #11   TITLE  Pt will improve R grip strength to at least 50lbs for gripping/lifting tasks.    Status  Achieved            Plan - 12/20/16 1025    Clinical Impression Statement  Patient has met and in some cases exceeded his long term goals, and is agreebale to OT discharge.      Rehab Potential  Good    OT Frequency  1x / week    OT Duration  4 weeks    OT Treatment/Interventions  Self-care/ADL training;Cryotherapy;Parrafin;Therapeutic exercise;DME and/or AE instruction;Functional Mobility Training;Manual Therapy;Neuromuscular education;Splinting;Fluidtherapy;Ultrasound;Electrical Stimulation;Moist Heat;Contrast Bath;Energy conservation;Passive range of motion;Therapeutic exercises;Therapeutic activities;Patient/family education    Plan  discharge    OT Home Exercise Plan  Education provided:  HEP, proper positiong of RUE, theraband HEP, green theraputty    Consulted and Agree with Plan of Care  Patient       Patient will benefit from skilled therapeutic intervention in order to improve the following deficits and impairments:  Decreased activity tolerance, Decreased coordination, Decreased knowledge of use of DME, Decreased strength, Impaired UE functional use, Pain, Improper body mechanics, Decreased range of motion, Decreased mobility, Impaired vision/preception, Decreased balance  Visit Diagnosis: Hemiplegia and hemiparesis following cerebral infarction affecting right dominant side (HCC)  Right shoulder pain, unspecified chronicity  Stiffness of right shoulder, not elsewhere classified    Problem List Patient Active Problem List   Diagnosis Date Noted  . Adhesive capsulitis of left shoulder 10/11/2016  . Dupuytren's contracture of right hand 02/18/2016  . Gait disturbance, post-stroke 01/04/2016  . Hemiparesis affecting right  side  as late effect of cerebrovascular accident (CVA) ([place]) 01/04/2016  . Cerebral infarction due to thrombosis of left middle cerebral artery ([place]) 12/15/2015  . Left middle cerebral artery stroke ([place]) 12/15/2015  . Oropharyngeal dysphagia   . Aphasia as late effect of stroke   . Right hemiplegia ([place])   . Acute on chronic respiratory failure ([place])   . Essential hypertension   . CVA (cerebral vascular accident) ([place]) 12/08/2015   OCCUPATIONAL THERAPY DISCHARGE SUMMARY  Visits from Start of Care: 30  Current functional level related to goals / functional outcomes: improved shoulder pain and strength in RUE.     Remaining deficits: SHOULDER JOINT STIFFNESS, RIGHT HAND STRENGTH   Education / Equipment: HEP, and education regarding positioning for comfort with sleep Plan: Patient agrees to discharge.  Patient goals were met. Patient is being discharged due to meeting the stated rehab goals.  ?????        Mariah Milling, OTR/L 12/20/2016, 10:30 AM  [facility] [address] [place], [place], [address] Phone: [phone]   Fax:  [phone]  Name: James Cortez MRN: [id number] Date of Birth: Mar 05, 1965

## 2016-12-27 ENCOUNTER — Ambulatory Visit: Payer: Self-pay | Admitting: Physical Medicine & Rehabilitation

## 2016-12-27 DIAGNOSIS — I69351 Hemiplegia and hemiparesis following cerebral infarction affecting right dominant side: Secondary | ICD-10-CM | POA: Insufficient documentation

## 2016-12-27 DIAGNOSIS — M25511 Pain in right shoulder: Secondary | ICD-10-CM | POA: Insufficient documentation

## 2016-12-27 DIAGNOSIS — Z87891 Personal history of nicotine dependence: Secondary | ICD-10-CM | POA: Insufficient documentation

## 2016-12-27 DIAGNOSIS — M25811 Other specified joint disorders, right shoulder: Secondary | ICD-10-CM | POA: Insufficient documentation

## 2016-12-27 DIAGNOSIS — Z85828 Personal history of other malignant neoplasm of skin: Secondary | ICD-10-CM | POA: Insufficient documentation

## 2017-01-02 ENCOUNTER — Ambulatory Visit: Payer: Self-pay | Attending: Family Medicine | Admitting: Family Medicine

## 2017-01-02 ENCOUNTER — Encounter: Payer: Self-pay | Admitting: Family Medicine

## 2017-01-02 VITALS — BP 121/63 | HR 63 | Temp 97.8°F | Ht 67.0 in | Wt 182.2 lb

## 2017-01-02 DIAGNOSIS — I10 Essential (primary) hypertension: Secondary | ICD-10-CM | POA: Insufficient documentation

## 2017-01-02 DIAGNOSIS — I69351 Hemiplegia and hemiparesis following cerebral infarction affecting right dominant side: Secondary | ICD-10-CM | POA: Insufficient documentation

## 2017-01-02 DIAGNOSIS — Z7902 Long term (current) use of antithrombotics/antiplatelets: Secondary | ICD-10-CM | POA: Insufficient documentation

## 2017-01-02 DIAGNOSIS — G8191 Hemiplegia, unspecified affecting right dominant side: Secondary | ICD-10-CM

## 2017-01-02 DIAGNOSIS — Z85828 Personal history of other malignant neoplasm of skin: Secondary | ICD-10-CM | POA: Insufficient documentation

## 2017-01-02 DIAGNOSIS — Z7982 Long term (current) use of aspirin: Secondary | ICD-10-CM | POA: Insufficient documentation

## 2017-01-02 DIAGNOSIS — I63512 Cerebral infarction due to unspecified occlusion or stenosis of left middle cerebral artery: Secondary | ICD-10-CM

## 2017-01-02 DIAGNOSIS — Z79899 Other long term (current) drug therapy: Secondary | ICD-10-CM | POA: Insufficient documentation

## 2017-01-02 MED ORDER — ATORVASTATIN CALCIUM 20 MG PO TABS: 20.0000 mg | ORAL_TABLET | Freq: Every day | ORAL | 5 refills | Status: DC

## 2017-01-02 MED ORDER — ASPIRIN 325 MG PO TBEC: 325.0000 mg | DELAYED_RELEASE_TABLET | Freq: Every day | ORAL | 5 refills | Status: DC

## 2017-01-02 MED FILL — ?ATORVASTATIN 20 MG TABLET: 20 | 30 days supply | Qty: 30 | Fill #0

## 2017-01-02 NOTE — Progress Notes (Signed)
Subjective:  Patient ID: James Cortez, male    DOB: [birth year]  Age: 51 y.o. MRN: [id number]  CC: Hypertension   HPI [facility] [name] is a 51 year old male with left middle cerebral artery stroke in 12/2015 (with initial presentation of aphasia, right facial droop and right hemiparesis who received TPA with improvement in stroke score) s/p loop recorder placement here for follow-up visit.  He has made significant improvement with resolution of his aphasia and improvement of right hemiparesis.  He has completed his session of physical therapy and reports significant improvement in his range of motion with minimal right upper extremity pain. Last month he received a right glenohumeral joint injection by Dr. Letta Pate.  He has no acute concerns today  Past Medical History:  Diagnosis Date  . Skin cancer     Past Surgical History:  Procedure Laterality Date  . EP IMPLANTABLE DEVICE N/A 12/15/2015   Procedure: Loop Recorder Insertion;  Surgeon: Will Meredith Leeds, MD;  Location: [facility];  Service: Cardiovascular;  Laterality: N/A;  . IR GENERIC HISTORICAL  12/08/2015   IR PERCUTANEOUS ART THROMBECTOMY/INFUSION INTRACRANIAL INC DIAG ANGIO 12/08/2015 Luanne Bras, MD [facility]  . RADIOLOGY WITH ANESTHESIA Right 12/08/2015   Procedure: RADIOLOGY WITH ANESTHESIA - CODE STROKE;  Surgeon: Medication Radiologist, MD;  Location: [facility];  Service: Radiology;  Laterality: Right;  . SKIN SURGERY    . TEE WITHOUT CARDIOVERSION N/A 12/15/2015   Procedure: TRANSESOPHAGEAL ECHOCARDIOGRAM (TEE);  Surgeon: Sueanne Margarita, MD;  Location: [facility];  Service: Cardiovascular;  Laterality: N/A;    No Known Allergies   Outpatient Medications Prior to Visit  Medication Sig Dispense Refill  . aspirin 325 MG EC tablet Take 1 tablet (325 mg total) by mouth daily. 30 tablet 2  . atorvastatin (LIPITOR) 20 MG tablet Take 1 tablet (20 mg total) by mouth daily at  6 PM. 30 tablet 3  . clopidogrel (PLAVIX) 75 MG tablet Take 1 tablet (75 mg total) by mouth daily with breakfast. 30 tablet 3   No facility-administered medications prior to visit.     ROS Review of Systems  Constitutional: Negative for activity change and appetite change.  HENT: Negative for sinus pressure and sore throat.   Eyes: Negative for visual disturbance.  Respiratory: Negative for cough, chest tightness and shortness of breath.   Cardiovascular: Negative for chest pain and leg swelling.  Gastrointestinal: Negative for abdominal distention, abdominal pain, constipation and diarrhea.  Endocrine: Negative.   Genitourinary: Negative for dysuria.  Musculoskeletal: Negative for joint swelling and myalgias.  Skin: Negative for rash.  Allergic/Immunologic: Negative.   Neurological: Negative for weakness, light-headedness and numbness.  Psychiatric/Behavioral: Negative for dysphoric mood and suicidal ideas.    Objective:  BP 121/63   Pulse 63   Temp 97.8 F (36.6 C) (Oral)   Ht 5\' 7"  (1.702 m)   Wt 182 lb 3.2 oz (82.6 kg)   SpO2 96%   BMI 28.54 kg/m   BP/Weight 01/02/2017 57/32/2025 05/09/7060  Systolic BP 376 283 151  Diastolic BP 63 74 74  Wt. (Lbs) 182.2 - -  BMI 28.54 - -      Physical Exam  Constitutional: He is oriented to person, place, and time. He appears well-developed and well-nourished.  Cardiovascular: Normal rate, normal heart sounds and intact distal pulses.  No murmur heard. Pulmonary/Chest: Effort normal and breath sounds normal. He has no wheezes. He has no rales. He exhibits no tenderness.  Abdominal: Soft.  Bowel sounds are normal. He exhibits no distension and no mass. There is no tenderness.  Musculoskeletal: Normal range of motion.  Right hand middle flexor tendon contracture  Neurological: He is alert and oriented to person, place, and time.  Motor strength: 5/5 globally  Skin: Skin is warm and dry.  Psychiatric: He has a normal mood and  affect.     Assessment & Plan:   1. Left middle cerebral artery stroke [facility]) S/p loop recorder - last check 12/10/16 He has been on Plavix and aspirin for the last 1 year Discontinue Plavix due to increased risk of bleeding Risk factor modification - atorvastatin (LIPITOR) 20 MG tablet; Take 1 tablet (20 mg total) by mouth daily at 6 PM.  Dispense: 30 tablet; Refill: 5 - aspirin 325 MG EC tablet; Take 1 tablet (325 mg total) by mouth daily.  Dispense: 30 tablet; Refill: 5  2. Right hemiplegia (HCC) Improved significantly Status post right gleno humeral injection with improvement in symptoms   Meds ordered this encounter  Medications  . atorvastatin (LIPITOR) 20 MG tablet    Sig: Take 1 tablet (20 mg total) by mouth daily at 6 PM.    Dispense:  30 tablet    Refill:  5  . aspirin 325 MG EC tablet    Sig: Take 1 tablet (325 mg total) by mouth daily.    Dispense:  30 tablet    Refill:  5    Follow-up: Return in about 3 months (around 04/04/2017) for Follow-up of stroke.   Arnoldo Morale MD

## 2017-01-02 NOTE — Patient Instructions (Signed)
Prevencin del ictus (Stroke Prevention) Algunas enfermedades o conductas se asocian al aumento en el riesgo de sufrir ictus. Puede evitar el ictus si elige llevar una vida sana y controla sus enfermedades. [place]?  Mantngase fsicamente activo. Realice al menos 30 minutos de [employer].  No fume. Tambin puede ser til [employer] exposicin al humo de [employer].  Limite el consumo de bebidas alcohlicas. Se considera consumo moderado: ? No ms de 2 medidas por da para los hombres. ? No ms de 1 medida por da para las mujeres que no estn [place].  Consuma alimentos saludables. Esto implica: ? Consuma 5 o ms porciones de frutas y verduras al da. ? Haga cambios en la dieta para controlar la presin arterial alta (hipertensin), el colesterol alto, la diabetes o la obesidad.  Controle los niveles de [place]. ? Elija alimentos que tienen alto contenido de [place] y bajo contenido de grasas saturadas, grasas trans y colesterol para Chief Technology Officer los niveles de [place]. ? [employer] le haya indicado su mdico para Academic librarian.  Controle su diabetes. ? Para controlar la diabetes, se recomienda controlar el consumo de hidratos de carbono y Location manager. ? Tome los [employer] le haya indicado su mdico para controlar la diabetes.  Controle su hipertensin. ? Para controlar la hipertensin, se recomienda una dieta baja en sal (sodio), grasas saturadas, grasas trans y colesterol. ? Pregntele al mdico si necesita tratamiento para bajar la presin arterial. [employer] que le haya indicado su mdico para controlar la hipertensin. ? Si usted tiene entre 18 y 26 aos, debe medirse la presin arterial cada 3 a 5 aos. Si usted tiene 40 aos o ms, debe medirse la presin arterial [employer].  Mantenga un peso saludable. ? Para Technical sales engineer, se recomienda reducir el consumo de caloras y  elegir alimentos con bajo contenido de sodio, grasas saturadas, grasas trans y colesterol.  Evite el consumo de drogas.  Evite tomar pldoras anticonceptivas. ? Hable con su mdico [employer] de tomar pastillas anticonceptivas si usted tiene ms de 35 aos de [place], fuma, tiene [place], o ha tenido un cogulo de [place].  Hgase una evaluacin de los trastornos del sueo (apnea del sueo). ? Hable con su mdico sobre cmo hacer una evaluacin del sueo si ronca mucho o tiene excesiva somnolencia.  Tome los medicamentos solamente como se lo haya indicado el mdico. ? En algunos individuos, la aspirina o los anticoagulantes pueden ser de utilidad para reducir el riesgo de formacin de cogulos anormales que puedan causar un ictus. Si tiene un ritmo cardaco irregular o fibrilacin auricular, debe tomar anticoagulantes, excepto que exista una buena razn para no hacerlo. ? Comprenda todas las indicaciones para la utilizacin de los medicamentos.  Asegrese de que otras enfermedades (como anemia o ateroesclerosis) tambin estn bajo control.  SOLICITE ATENCIN MDICA DE INMEDIATO SI:  Siente debilidad o adormecimiento sbito de la cara, el brazo o la pierna, especialmente en un lado del cuerpo.  Su cara o prpado se caen hacia un lado.  Se siente repentinamente confundido.  Tiene dificultad para hablar (afasia) o comprender.  Presenta, sbitamente, dificultad para ver de uno o ambos ojos.  Tiene dificultad repentina para caminar.  Tiene mareos.  Pierde el equilibrio o la coordinacin.  Siente sbitamente un dolor de cabeza intenso que no tiene causa aparente.  Siente un nuevo dolor en el pecho o latidos cardacos irregulares. Cualquiera  de estos sntomas puede representar un problema grave y es Engineer, maintenance (IT). No espere hasta que los sntomas desaparezcan. Obtenga ayuda mdica de inmediato. Comunquese con el servicio de emergencias de su localidad (911 en los [place]).  No conduzca por sus propios medios [employer]. Esta informacin no tiene Marine scientist el consejo del mdico. Asegrese de hacerle al mdico cualquier pregunta que tenga. Document Released: 01/24/2005 Document Revised: 02/14/2014 Document Reviewed: 07/27/2012 Elsevier Interactive Patient Education  2017 [employer].

## 2017-01-03 ENCOUNTER — Encounter: Payer: Self-pay | Admitting: Physical Medicine & Rehabilitation

## 2017-01-03 ENCOUNTER — Encounter: Payer: Self-pay | Attending: Physical Medicine & Rehabilitation

## 2017-01-03 ENCOUNTER — Ambulatory Visit (HOSPITAL_BASED_OUTPATIENT_CLINIC_OR_DEPARTMENT_OTHER): Payer: Self-pay | Admitting: Physical Medicine & Rehabilitation

## 2017-01-03 ENCOUNTER — Other Ambulatory Visit: Payer: Self-pay

## 2017-01-03 VITALS — BP 114/76 | HR 72 | Resp 14

## 2017-01-03 DIAGNOSIS — I69351 Hemiplegia and hemiparesis following cerebral infarction affecting right dominant side: Secondary | ICD-10-CM

## 2017-01-03 NOTE — Progress Notes (Signed)
Subjective:    Patient ID: [facility], male    DOB: 10-22-1965, 51 y.o.   MRN: [id number] 51 year old male with history of left MCA distribution infarct resulting in a aphasia and right hemiparesis, onset 12/08/2015. Status post mechanical thrombectomy and left MCA stent HPI Last visit was 11/22/2016, right hemiparesis , shoulder pain  main issues. Glenohumeral injection of the right shoulder was helpful in alleviating shoulder pain with activity. Has returned to work as a Horticulturist, commercial.  Work hours have been low secondary to weather.  But denies any issues at work.  Driving without issue. Reviewed PCP note from 01/02/2017, patient states he is taking both his aspirin and Lipitor Pain Inventory Average Pain 3 Pain Right Now 3 My pain is dull  In the last 24 hours, has pain interfered with the following? General activity 5 Relation with others 7 Enjoyment of life 7 What TIME of day is your pain at its worst? night Sleep (in general) Good  Pain is worse with: some activites Pain improves with: therapy/exercise Relief from Meds: n/a  Mobility walk without assistance ability to climb steps?  yes do you drive?  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  no  Physicians involved in your care Any changes since last visit?  no   Family History  Family history unknown: Yes   Social History   Socioeconomic History  . Marital status: Married    Spouse name: None  . Number of children: None  . Years of education: None  . Highest education level: None  Social Needs  . Financial resource strain: None  . Food insecurity - worry: None  . Food insecurity - inability: None  . Transportation needs - medical: None  . Transportation needs - non-medical: None  Occupational History  . None  Tobacco Use  . Smoking status: Former Smoker   Packs/day: 0.00  . Smokeless tobacco: Never Used  . Tobacco comment: quit 5 months ago-"isn't even having one"  Substance and Sexual Activity  . Alcohol use: No  . Drug use: No  . Sexual activity: None  Other Topics Concern  . None  Social History Narrative  . None   Past Surgical History:  Procedure Laterality Date  . EP IMPLANTABLE DEVICE N/A 12/15/2015   Procedure: Loop Recorder Insertion;  Surgeon: Will Meredith Leeds, MD;  Location: [facility];  Service: Cardiovascular;  Laterality: N/A;  . IR GENERIC HISTORICAL  12/08/2015   IR PERCUTANEOUS ART THROMBECTOMY/INFUSION INTRACRANIAL INC DIAG ANGIO 12/08/2015 Luanne Bras, MD [facility]  . RADIOLOGY WITH ANESTHESIA Right 12/08/2015   Procedure: RADIOLOGY WITH ANESTHESIA - CODE STROKE;  Surgeon: Medication Radiologist, MD;  Location: [facility];  Service: Radiology;  Laterality: Right;  . SKIN SURGERY    . TEE WITHOUT CARDIOVERSION N/A 12/15/2015   Procedure: TRANSESOPHAGEAL ECHOCARDIOGRAM (TEE);  Surgeon: Sueanne Margarita, MD;  Location: [facility];  Service: Cardiovascular;  Laterality: N/A;   Past Medical History:  Diagnosis Date  . Skin cancer    BP 114/76   Pulse 72   Resp 14   SpO2 94%   Opioid Risk Score:   Fall Risk Score:  `1  Depression screen PHQ 2/9  Depression screen [facility] 2/9 01/02/2017 09/26/2016 06/20/2016 04/22/2016 02/18/2016 01/13/2016  Decreased Interest 0 3 0 1 0 3  Down, Depressed, Hopeless 0 0 0 0 0  0  PHQ - 2 Score 0 3 0 1 0 3  Altered sleeping 0 0 0 0 - 0  Tired, decreased energy 0 0 0 0 - 0  Change in appetite 0 0 0 3 - 0  Feeling bad or failure about yourself  0 0 0 0 - 0  Trouble concentrating 0 0 0 0 - 3  Moving slowly or fidgety/restless 0 0 0 0 - 3  Suicidal thoughts 0 0 0 0 - 0  PHQ-9 Score 0 3 0 4 - 9    Review of Systems  Constitutional: Negative.   HENT: Negative.   Eyes: Negative.   Respiratory: Negative.   Cardiovascular: Negative.   Gastrointestinal: Negative.     Endocrine: Negative.   Genitourinary: Negative.   Musculoskeletal: Positive for arthralgias.  Skin: Negative.   Allergic/Immunologic: Negative.   Neurological: Negative.   Hematological: Negative.   Psychiatric/Behavioral: Negative.   All other systems reviewed and are negative.      Objective:   Physical Exam  Constitutional: He is oriented to person, place, and time. He appears well-developed and well-nourished. No distress.  HENT:  Head: Normocephalic and atraumatic.  Eyes: Conjunctivae and EOM are normal. Pupils are equal, round, and reactive to light.  Neck: Normal range of motion.  Neurological: He is alert and oriented to person, place, and time. He displays no tremor. He exhibits normal muscle tone. He displays no seizure activity. Coordination and gait normal.  Skin: He is not diaphoretic.  Psychiatric: He has a normal mood and affect. His behavior is normal. Thought content normal.  Nursing note and vitals reviewed.  Motor strength: 5-/5 in the right deltoid bicep tricep grip 5/5 in the left deltoid bicep tricep grip 5/5 in bilateral hip flexors knee extensors ankle dorsiflexors Mood and affect are appropriate Gait is without evidence of toe drag or knee instability.        Assessment & Plan:  1.  History of left MCA infarct, he has residual minimal right upper extremity weakness but this is not functionally affecting him.  He has had excellent recovery.  No residual a aphasia. Visit was conducted with the aid of a Spanish language interpreter. Physical medicine and rehabilitation follow-up on as-needed basis Patient will follow-up with PCP

## 2017-01-09 ENCOUNTER — Ambulatory Visit (INDEPENDENT_AMBULATORY_CARE_PROVIDER_SITE_OTHER): Payer: Self-pay | Admitting: *Deleted

## 2017-01-09 DIAGNOSIS — I639 Cerebral infarction, unspecified: Secondary | ICD-10-CM

## 2017-01-09 NOTE — Progress Notes (Signed)
Carelink Summary Report / Loop Recorder 

## 2017-01-17 LAB — CUP PACEART REMOTE DEVICE CHECK
Date Time Interrogation Session: 20181203010925
MDC IDC PG IMPLANT DT: 20171107

## 2017-02-02 ENCOUNTER — Telehealth: Payer: Self-pay | Admitting: Cardiology

## 2017-02-02 NOTE — Telephone Encounter (Signed)
Spoke w/ pt and requested that he send a manual transmission b/c his home monitor has not updated in at least 14 days.   

## 2017-02-03 ENCOUNTER — Telehealth: Payer: Self-pay | Admitting: *Deleted

## 2017-02-03 MED FILL — ?ATORVASTATIN 20 MG TABLET: 20 | 30 days supply | Qty: 30 | Fill #1

## 2017-02-03 NOTE — Telephone Encounter (Signed)
Spoke with patient but had difficulty communicating. Used the interpretation line and was able to successfully troubleshoot sending a remote transmission. Patient asked how long the ILR would last - I explained that it had a three year battery and he had it placed a year ago. Patient verbalized understanding and denied any further questions.

## 2017-02-03 NOTE — Telephone Encounter (Signed)
°  1. Has your device fired? no ° °2. Is you device beeping? no ° °3. Are you experiencing draining or swelling at device site?no ° °4. Are you calling to see if we received your device transmission?yes ° °5. Have you passed out? no ° ° ° °Please route to Device Clinic Pool °

## 2017-02-08 ENCOUNTER — Ambulatory Visit (INDEPENDENT_AMBULATORY_CARE_PROVIDER_SITE_OTHER): Payer: Self-pay | Admitting: *Deleted

## 2017-02-08 DIAGNOSIS — I639 Cerebral infarction, unspecified: Secondary | ICD-10-CM

## 2017-02-09 NOTE — Progress Notes (Signed)
Carelink Summary Report / Loop Recorder 

## 2017-02-21 LAB — CUP PACEART REMOTE DEVICE CHECK
Date Time Interrogation Session: 20190102011020
Implantable Pulse Generator Implant Date: 20171107

## 2017-03-09 ENCOUNTER — Ambulatory Visit (INDEPENDENT_AMBULATORY_CARE_PROVIDER_SITE_OTHER): Payer: Self-pay | Admitting: *Deleted

## 2017-03-09 DIAGNOSIS — I639 Cerebral infarction, unspecified: Secondary | ICD-10-CM

## 2017-03-10 NOTE — Progress Notes (Signed)
Carelink Summary Report / Loop Recorder 

## 2017-03-14 MED FILL — ?ATORVASTATIN 20 MG TABLET: 20 | 30 days supply | Qty: 30 | Fill #2

## 2017-03-21 LAB — CUP PACEART REMOTE DEVICE CHECK
Implantable Pulse Generator Implant Date: 20171107
MDC IDC SESS DTM: 20190201011003

## 2017-04-04 ENCOUNTER — Ambulatory Visit: Payer: Self-pay | Attending: Family Medicine | Admitting: Family Medicine

## 2017-04-04 ENCOUNTER — Encounter: Payer: Self-pay | Admitting: Family Medicine

## 2017-04-04 VITALS — BP 135/75 | HR 76 | Temp 97.6°F | Ht 67.0 in | Wt 180.8 lb

## 2017-04-04 DIAGNOSIS — Z7982 Long term (current) use of aspirin: Secondary | ICD-10-CM | POA: Insufficient documentation

## 2017-04-04 DIAGNOSIS — R0982 Postnasal drip: Secondary | ICD-10-CM | POA: Insufficient documentation

## 2017-04-04 DIAGNOSIS — I63512 Cerebral infarction due to unspecified occlusion or stenosis of left middle cerebral artery: Secondary | ICD-10-CM

## 2017-04-04 DIAGNOSIS — Z8619 Personal history of other infectious and parasitic diseases: Secondary | ICD-10-CM

## 2017-04-04 DIAGNOSIS — R4701 Aphasia: Secondary | ICD-10-CM | POA: Insufficient documentation

## 2017-04-04 DIAGNOSIS — G8929 Other chronic pain: Secondary | ICD-10-CM | POA: Insufficient documentation

## 2017-04-04 DIAGNOSIS — J029 Acute pharyngitis, unspecified: Secondary | ICD-10-CM | POA: Insufficient documentation

## 2017-04-04 DIAGNOSIS — I69934 Monoplegia of upper limb following unspecified cerebrovascular disease affecting left non-dominant side: Secondary | ICD-10-CM | POA: Insufficient documentation

## 2017-04-04 DIAGNOSIS — Z79899 Other long term (current) drug therapy: Secondary | ICD-10-CM | POA: Insufficient documentation

## 2017-04-04 DIAGNOSIS — R29810 Facial weakness: Secondary | ICD-10-CM | POA: Insufficient documentation

## 2017-04-04 DIAGNOSIS — Z9889 Other specified postprocedural states: Secondary | ICD-10-CM | POA: Insufficient documentation

## 2017-04-04 DIAGNOSIS — Z0000 Encounter for general adult medical examination without abnormal findings: Secondary | ICD-10-CM

## 2017-04-04 DIAGNOSIS — M25511 Pain in right shoulder: Secondary | ICD-10-CM | POA: Insufficient documentation

## 2017-04-04 DIAGNOSIS — Z95818 Presence of other cardiac implants and grafts: Secondary | ICD-10-CM | POA: Insufficient documentation

## 2017-04-04 DIAGNOSIS — A6000 Herpesviral infection of urogenital system, unspecified: Secondary | ICD-10-CM | POA: Insufficient documentation

## 2017-04-04 DIAGNOSIS — Z85828 Personal history of other malignant neoplasm of skin: Secondary | ICD-10-CM | POA: Insufficient documentation

## 2017-04-04 MED ORDER — ASPIRIN 325 MG PO TBEC: 325.0000 mg | DELAYED_RELEASE_TABLET | Freq: Every day | ORAL | 5 refills | Status: DC

## 2017-04-04 MED ORDER — CETIRIZINE HCL 10 MG PO TABS: 10.0000 mg | ORAL_TABLET | Freq: Every day | ORAL | 1 refills | Status: DC

## 2017-04-04 MED ORDER — ATORVASTATIN CALCIUM 20 MG PO TABS: 20.0000 mg | ORAL_TABLET | Freq: Every day | ORAL | 5 refills | Status: DC

## 2017-04-04 MED FILL — ?CETIRIZINE HCL 10 MG TABLE: 10 | 30 days supply | Qty: 30 | Fill #0

## 2017-04-04 NOTE — Progress Notes (Signed)
Subjective:  Patient ID: James Cortez, male    DOB: Oct 30, 1965  Age: 52 y.o. MRN: [id number]  CC: Heat Exposure and Sore Throat   HPI [place]  is a 52 year old male with left middle cerebral artery stroke in 12/2015 (with initial presentation of aphasia, right facial droop and right hemiparesis who received TPA with improvement in stroke score) s/p loop recorder placement here for follow-up visit. His loop recorder was recently interrogated on 03/10/17 with no normal rhythm detected.  He complains of postnasal drip, sore throat which is worse on lying down and this is associated with cough productive of white sputum intermittently.  With regards to his CVA and right upper extremity weakness he complains of slight anterior right shoulder pain when he elevates his arm above his shoulder and engages in stretching activities at home.  He has made significant improvement with resolution of his aphasia and improvement of right hemiparesis.  He has completed his session of physical therapy. The previously received a right glenohumeral joint injection by Dr. Letta Pate; his last office visit; with physical medicine and rehab was on 01/03/17 and follow-up on an as-needed basis was recommended.  Past Medical History:  Diagnosis Date  . Skin cancer     Past Surgical History:  Procedure Laterality Date  . EP IMPLANTABLE DEVICE N/A 12/15/2015   Procedure: Loop Recorder Insertion;  Surgeon: Will Meredith Leeds, MD;  Location: [place] [facility];  Service: Cardiovascular;  Laterality: N/A;  . IR GENERIC HISTORICAL  12/08/2015   IR PERCUTANEOUS ART THROMBECTOMY/INFUSION INTRACRANIAL INC DIAG ANGIO 12/08/2015 Luanne Bras, MD [facility]  . RADIOLOGY WITH ANESTHESIA Right 12/08/2015   Procedure: RADIOLOGY WITH ANESTHESIA - CODE STROKE;  Surgeon: Medication Radiologist, MD;  Location: [place];  Service: Radiology;  Laterality: Right;  . SKIN SURGERY    . TEE WITHOUT  CARDIOVERSION N/A 12/15/2015   Procedure: TRANSESOPHAGEAL ECHOCARDIOGRAM (TEE);  Surgeon: Sueanne Margarita, MD;  Location: [facility];  Service: Cardiovascular;  Laterality: N/A;    No Known Allergies   Outpatient Medications Prior to Visit  Medication Sig Dispense Refill  . aspirin 325 MG EC tablet Take 1 tablet (325 mg total) by mouth daily. 30 tablet 5  . atorvastatin (LIPITOR) 20 MG tablet Take 1 tablet (20 mg total) by mouth daily at 6 PM. 30 tablet 5   No facility-administered medications prior to visit.     ROS Review of Systems  Constitutional: Negative for activity change and appetite change.  HENT: Positive for postnasal drip and sore throat. Negative for sinus pressure.   Eyes: Negative for visual disturbance.  Respiratory: Positive for cough. Negative for chest tightness and shortness of breath.   Cardiovascular: Negative for chest pain and leg swelling.  Gastrointestinal: Negative for abdominal distention, abdominal pain, constipation and diarrhea.  Endocrine: Negative.   Genitourinary: Negative for dysuria.  Musculoskeletal:       See hpi  Skin: Negative for rash.  Allergic/Immunologic: Negative.   Neurological: Negative for weakness, light-headedness and numbness.  Psychiatric/Behavioral: Negative for dysphoric mood and suicidal ideas.    Objective:  BP 135/75   Pulse 76   Temp 97.6 F (36.4 C) (Oral)   Ht '5\' 7"'$  (1.702 m)   Wt 180 lb 12.8 oz (82 kg)   BMI 28.32 kg/m   BP/Weight 04/04/2017 01/03/2017 35/00/9381  Systolic BP 829 937 169  Diastolic BP 75 76 63  Wt. (Lbs) 180.8 - 182.2  BMI 28.32 - 28.54  Physical Exam  Constitutional: He is oriented to person, place, and time. He appears well-developed and well-nourished.  Cardiovascular: Normal rate, normal heart sounds and intact distal pulses.  No murmur heard. Pulmonary/Chest: Effort normal and breath sounds normal. He has no wheezes. He has no rales. He exhibits no tenderness.  Abdominal:  Soft. Bowel sounds are normal. He exhibits no distension and no mass. There is no tenderness.  Musculoskeletal: Normal range of motion. He exhibits tenderness (Slight TTP of right shoulder and tenderness on elevation beyond 110 degrees).  Neurological: He is alert and oriented to person, place, and time.  Skin: Skin is warm and dry.  Psychiatric: He has a normal mood and affect.     Assessment & Plan:   1. 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  Dispense: 30 tablet; Refill: 5 - aspirin 325 MG EC tablet; Take 1 tablet (325 mg total) by mouth daily.  Dispense: 30 tablet; Refill: 5  2. Healthcare maintenance Declines colonoscopy, Tdap  3. Status post placement of implantable loop recorder Stable Last interrogation 03/10/17  4. Chronic right shoulder pain Secondary to weakness from stroke Advised to continue physical therapy at home  5. 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 Post-nasal drip - cetirizine (ZYRTEC) 10 MG tablet; Take 1 tablet (10 mg total) by mouth daily.  Dispense: 30 tablet; Refill: 1   Meds ordered this encounter  Medications  . atorvastatin (LIPITOR) 20 MG tablet    Sig: Take 1 tablet (20 mg total) by mouth daily at 6 PM.    Dispense:  30 tablet    Refill:  5  . aspirin 325 MG EC tablet    Sig: Take 1 tablet (325 mg total) by mouth daily.    Dispense:  30 tablet    Refill:  5  . cetirizine (ZYRTEC) 10 MG tablet    Sig: Take 1 tablet (10 mg total) by mouth daily.    Dispense:  30 tablet    Refill:  1    Follow-up: Return in about 3 months (around 07/02/2017) for Follow-up of chronic medical conditions.   Charlott Rakes MD

## 2017-04-04 NOTE — Patient Instructions (Signed)
Shoulder Pain Many things can cause shoulder pain, including:  An injury.  Moving the arm in the same way again and again (overuse).  Joint pain (arthritis).  Follow these instructions at home: Take these actions to help with your pain:  Squeeze a soft ball or a foam pad as much as you can. This helps to prevent swelling. It also makes the arm stronger.  Take over-the-counter and prescription medicines only as told by your doctor.  If told, put ice on the area: ? Put ice in a plastic bag. ? Place a towel between your skin and the bag. ? Leave the ice on for 20 minutes, 2-3 times per day. Stop putting on ice if it does not help with the pain.  If you were given a shoulder sling or immobilizer: ? Wear it as told. ? Remove it to shower or bathe. ? Move your arm as little as possible. ? Keep your hand moving. This helps prevent swelling.  Contact a doctor if:  Your pain gets worse.  Medicine does not help your pain.  You have new pain in your arm, hand, or fingers. Get help right away if:  Your arm, hand, or fingers: ? Tingle. ? Are numb. ? Are swollen. ? Are painful. ? Turn white or blue. This information is not intended to replace advice given to you by your health care provider. Make sure you discuss any questions you have with your health care provider. Document Released: 07/13/2007 Document Revised: 09/20/2015 Document Reviewed: 05/19/2014 Elsevier Interactive Patient Education  2018 Elsevier Inc.  

## 2017-04-05 LAB — CMP14+EGFR
ALBUMIN: 4.4 g/dL (ref 3.5–5.5)
ALT: 29 IU/L (ref 0–44)
AST: 25 IU/L (ref 0–40)
Albumin/Globulin Ratio: 1.7 (ref 1.2–2.2)
Alkaline Phosphatase: 87 IU/L (ref 39–117)
BUN / CREAT RATIO: 16 (ref 9–20)
BUN: 12 mg/dL (ref 6–24)
Bilirubin Total: 0.4 mg/dL (ref 0.0–1.2)
CO2: 22 mmol/L (ref 20–29)
CREATININE: 0.74 mg/dL — AB (ref 0.76–1.27)
Calcium: 9.5 mg/dL (ref 8.7–10.2)
Chloride: 103 mmol/L (ref 96–106)
GFR calc Af Amer: 123 mL/min/{1.73_m2} (ref >59)
GFR calc non Af Amer: 107 mL/min/{1.73_m2} (ref >59)
GLUCOSE: 89 mg/dL (ref 65–99)
Globulin, Total: 2.6 g/dL (ref 1.5–4.5)
Potassium: 4.6 mmol/L (ref 3.5–5.2)
Sodium: 142 mmol/L (ref 134–144)
TOTAL PROTEIN: 7 g/dL (ref 6.0–8.5)

## 2017-04-05 LAB — HSV TYPE I/II IGG, IGMW/ REFLEX
HSV 1 Glycoprotein G Ab, IgG: 38.1 index — ABNORMAL HIGH (ref 0.00–0.90)
HSV 1 IgM: <1:10 {titer}
HSV 2 IgG, Type Spec: 15.3 index — ABNORMAL HIGH (ref 0.00–0.90)
HSV 2 IgM: <1:10 {titer}

## 2017-04-07 ENCOUNTER — Telehealth: Payer: Self-pay

## 2017-04-07 NOTE — Telephone Encounter (Signed)
Patient was called and informed of lab results. 

## 2017-04-11 ENCOUNTER — Ambulatory Visit (INDEPENDENT_AMBULATORY_CARE_PROVIDER_SITE_OTHER): Payer: Self-pay | Admitting: *Deleted

## 2017-04-11 DIAGNOSIS — I639 Cerebral infarction, unspecified: Secondary | ICD-10-CM

## 2017-04-12 NOTE — Progress Notes (Signed)
Carelink Summary Report / Loop Recorder 

## 2017-04-19 ENCOUNTER — Ambulatory Visit: Payer: Self-pay | Attending: Family Medicine

## 2017-04-25 ENCOUNTER — Other Ambulatory Visit: Payer: Self-pay | Admitting: Pharmacist

## 2017-04-25 MED ORDER — TETANUS-DIPHTH-ACELL PERTUSSIS 5-2.5-18.5 LF-MCG/0.5 IM SUSP: 0.5000 mL | Freq: Once | INTRAMUSCULAR | 0 refills | Status: AC

## 2017-04-26 MED FILL — ATORVASTATIN 20 MG TABLET: 20 | 30 days supply | Qty: 30 | Fill #0

## 2017-04-27 MED FILL — $BOOSTRIX VACCINE SYRINGE: 5-2.5-18.5 | 1 days supply | Qty: 1 | Fill #0

## 2017-05-02 DIAGNOSIS — I10 Essential (primary) hypertension: Secondary | ICD-10-CM

## 2017-05-15 ENCOUNTER — Ambulatory Visit (INDEPENDENT_AMBULATORY_CARE_PROVIDER_SITE_OTHER): Payer: Self-pay | Admitting: *Deleted

## 2017-05-15 DIAGNOSIS — I639 Cerebral infarction, unspecified: Secondary | ICD-10-CM

## 2017-05-15 NOTE — Progress Notes (Signed)
Carelink Summary Report / Loop Recorder 

## 2017-05-23 LAB — CUP PACEART REMOTE DEVICE CHECK
Implantable Pulse Generator Implant Date: 20171107
MDC IDC SESS DTM: 20190306013539

## 2017-06-08 MED FILL — ATORVASTATIN 20 MG TABLET: 20 | 30 days supply | Qty: 30 | Fill #1

## 2017-06-15 LAB — CUP PACEART REMOTE DEVICE CHECK
MDC IDC PG IMPLANT DT: 20171107
MDC IDC SESS DTM: 20190408014022

## 2017-06-16 ENCOUNTER — Ambulatory Visit (INDEPENDENT_AMBULATORY_CARE_PROVIDER_SITE_OTHER): Payer: Self-pay | Admitting: *Deleted

## 2017-06-16 DIAGNOSIS — I639 Cerebral infarction, unspecified: Secondary | ICD-10-CM

## 2017-06-19 NOTE — Progress Notes (Signed)
Carelink Summary Report / Loop Recorder 

## 2017-07-04 ENCOUNTER — Other Ambulatory Visit: Payer: Self-pay

## 2017-07-04 ENCOUNTER — Encounter: Payer: Self-pay | Admitting: Family Medicine

## 2017-07-04 ENCOUNTER — Ambulatory Visit: Payer: Self-pay | Attending: Family Medicine | Admitting: Family Medicine

## 2017-07-04 VITALS — BP 118/76 | HR 58 | Temp 97.8°F | Resp 16 | Wt 176.0 lb

## 2017-07-04 DIAGNOSIS — Z09 Encounter for follow-up examination after completed treatment for conditions other than malignant neoplasm: Secondary | ICD-10-CM | POA: Insufficient documentation

## 2017-07-04 DIAGNOSIS — Z7982 Long term (current) use of aspirin: Secondary | ICD-10-CM | POA: Insufficient documentation

## 2017-07-04 DIAGNOSIS — I63512 Cerebral infarction due to unspecified occlusion or stenosis of left middle cerebral artery: Secondary | ICD-10-CM

## 2017-07-04 DIAGNOSIS — R29810 Facial weakness: Secondary | ICD-10-CM | POA: Insufficient documentation

## 2017-07-04 DIAGNOSIS — Z85828 Personal history of other malignant neoplasm of skin: Secondary | ICD-10-CM | POA: Insufficient documentation

## 2017-07-04 DIAGNOSIS — I69351 Hemiplegia and hemiparesis following cerebral infarction affecting right dominant side: Secondary | ICD-10-CM

## 2017-07-04 DIAGNOSIS — Z1211 Encounter for screening for malignant neoplasm of colon: Secondary | ICD-10-CM

## 2017-07-04 DIAGNOSIS — Z8619 Personal history of other infectious and parasitic diseases: Secondary | ICD-10-CM

## 2017-07-04 DIAGNOSIS — Z8673 Personal history of transient ischemic attack (TIA), and cerebral infarction without residual deficits: Secondary | ICD-10-CM | POA: Insufficient documentation

## 2017-07-04 DIAGNOSIS — Z79899 Other long term (current) drug therapy: Secondary | ICD-10-CM | POA: Insufficient documentation

## 2017-07-04 DIAGNOSIS — R202 Paresthesia of skin: Secondary | ICD-10-CM | POA: Insufficient documentation

## 2017-07-04 MED ORDER — ATORVASTATIN CALCIUM 20 MG PO TABS: 20.0000 mg | ORAL_TABLET | Freq: Every day | ORAL | 5 refills | Status: DC

## 2017-07-04 MED FILL — ATORVASTATIN 20 MG TABLET: 20 | 30 days supply | Qty: 30 | Fill #0

## 2017-07-04 NOTE — Progress Notes (Signed)
Follow up appointment

## 2017-07-04 NOTE — Progress Notes (Signed)
Subjective:  Patient ID: James Cortez, male    DOB: 08/28/1965  Age: 52 y.o. MRN: [id number]  CC: Follow-up   HPI [place]  is a 52 year old handed male with left middle cerebral artery stroke in 12/2015 (with initial presentation of aphasia, right facial droop and right hemiparesis who received TPA with improvement in stroke score) s/p loop recorder placement here for follow-up visit. He complains of right hand weakness which occurs across his wrist on the dorsal and ventral aspect with associated numbness in his 5 fingers but denies the presence of pain.  Numbness does not radiate up his forearm and symptoms are more pronounced with his grip. He completed a session of physical therapy.  He is compliant with his statin and aspirin and denies any other symptoms.  His loop recorder was last interrogated on 05/16/2017, no arrhythmia seen. His last set of blood work revealed the presence of HSV 1 and HSV 2 IgG but no IgM.  He denies acute herpes flares. He has no chest pains, shortness of breath or pedal edema. With regards to his healthcare maintenance he has not had a colonoscopy.  Past Medical History:  Diagnosis Date  . Skin cancer     Past Surgical History:  Procedure Laterality Date  . EP IMPLANTABLE DEVICE N/A 12/15/2015   Procedure: Loop Recorder Insertion;  Surgeon: Will Meredith Leeds, MD;  Location: [place] [facility];  Service: Cardiovascular;  Laterality: N/A;  . IR GENERIC HISTORICAL  12/08/2015   IR PERCUTANEOUS ART THROMBECTOMY/INFUSION INTRACRANIAL INC DIAG ANGIO 12/08/2015 Luanne Bras, MD [facility]  . RADIOLOGY WITH ANESTHESIA Right 12/08/2015   Procedure: RADIOLOGY WITH ANESTHESIA - CODE STROKE;  Surgeon: Medication Radiologist, MD;  Location: [place];  Service: Radiology;  Laterality: Right;  . SKIN SURGERY    . TEE WITHOUT CARDIOVERSION N/A 12/15/2015   Procedure: TRANSESOPHAGEAL ECHOCARDIOGRAM (TEE);  Surgeon: Sueanne Margarita,  MD;  Location: [facility] ENDOSCOPY;  Service: Cardiovascular;  Laterality: N/A;    No Known Allergies   Outpatient Medications Prior to Visit  Medication Sig Dispense Refill  . aspirin 325 MG EC tablet Take 1 tablet (325 mg total) by mouth daily. 30 tablet 5  . atorvastatin (LIPITOR) 20 MG tablet Take 1 tablet (20 mg total) by mouth daily at 6 PM. 30 tablet 5  . cetirizine (ZYRTEC) 10 MG tablet Take 1 tablet (10 mg total) by mouth daily. (Patient not taking: Reported on 07/04/2017) 30 tablet 1   No facility-administered medications prior to visit.     ROS Review of Systems  Constitutional: Negative for activity change and appetite change.  HENT: Negative for sinus pressure and sore throat.   Eyes: Negative for visual disturbance.  Respiratory: Negative for cough, chest tightness and shortness of breath.   Cardiovascular: Negative for chest pain and leg swelling.  Gastrointestinal: Negative for abdominal distention, abdominal pain, constipation and diarrhea.  Endocrine: Negative.   Genitourinary: Negative for dysuria.  Musculoskeletal: Negative for joint swelling and myalgias.  Skin: Negative for rash.  Allergic/Immunologic: Negative.   Neurological: Positive for weakness and numbness. Negative for light-headedness.  Psychiatric/Behavioral: Negative for dysphoric mood and suicidal ideas.    Objective:  BP 118/76 (BP Location: Left Arm, Patient Position: Sitting, Cuff Size: Large)   Pulse (!) 58   Temp 97.8 F (36.6 C) (Oral)   Resp 16   Wt 176 lb (79.8 kg)   SpO2 96%   BMI 27.57 kg/m   BP/Weight 07/04/2017 04/04/2017 01/03/2017  Systolic BP 268 341 962  Diastolic BP 76 75 76  Wt. (Lbs) 176 180.8 -  BMI 27.57 28.32 -     Physical Exam  Constitutional: He is oriented to person, place, and time. He appears well-developed and well-nourished.  Cardiovascular: Normal rate, normal heart sounds and intact distal pulses.  No murmur heard. Pulmonary/Chest: Effort normal and breath  sounds normal. He has no wheezes. He has no rales. He exhibits no tenderness.  Abdominal: Soft. Bowel sounds are normal. He exhibits no distension and no mass. There is no tenderness.  Musculoskeletal: Normal range of motion.  No tenderness to palpation of right hand, wrist or fingers  Neurological: He is alert and oriented to person, place, and time.  Handgrip slightly reduced in right hand  Skin: Skin is warm and dry.  Psychiatric: He has a normal mood and affect.      CMP Latest Ref Rng & Units 04/04/2017 09/27/2016 04/29/2016  Glucose 65 - 99 mg/dL 89 87 96  BUN 6 - 24 mg/dL 12 11 21(H)  Creatinine 0.76 - 1.27 mg/dL 0.74(L) 0.78 0.80  Sodium 134 - 144 mmol/L 142 144 140  Potassium 3.5 - 5.2 mmol/L 4.6 4.3 4.0  Chloride 96 - 106 mmol/L 103 107(H) 106  CO2 20 - 29 mmol/L 22 22 -  Calcium 8.7 - 10.2 mg/dL 9.5 9.5 -  Total Protein 6.0 - 8.5 g/dL 7.0 6.8 -  Total Bilirubin 0.0 - 1.2 mg/dL 0.4 0.6 -  Alkaline Phos 39 - 117 IU/L 87 80 -  AST 0 - 40 IU/L 25 24 -  ALT 0 - 44 IU/L 29 29 -    Lipid Panel     Component Value Date/Time   CHOL 120 09/27/2016 0909   TRIG 134 09/27/2016 0909   HDL 31 (L) 09/27/2016 0909   CHOLHDL 3.9 09/27/2016 0909   CHOLHDL 6.6 12/09/2015 0430   VLDL 48 (H) 12/09/2015 0430   LDLCALC 62 09/27/2016 0909    Assessment & Plan:   1. Left middle cerebral artery stroke (HCC) With slight right hemiparesis and right hand paresthesia Risk factor modification - atorvastatin (LIPITOR) 20 MG tablet; Take 1 tablet (20 mg total) by mouth daily at 6 PM.  Dispense: 30 tablet; Refill: 5  2. Hemiparesis affecting right side as late effect of cerebrovascular accident (CVA) ([place]) Completed session of physical therapy  3. Right hand paresthesia Declines initiation of gabapentin or steroid Advised to continue exercise with the right hands  4. History of herpes genitalis No acute flare Discussed sexual precaution  5. Screening for colon cancer - Ambulatory  referral to Gastroenterology   Meds ordered this encounter  Medications  . atorvastatin (LIPITOR) 20 MG tablet    Sig: Take 1 tablet (20 mg total) by mouth daily at 6 PM.    Dispense:  30 tablet    Refill:  5    Follow-up: Return in about 3 months (around 10/04/2017) for follow up of chronic medical conditions.   Charlott Rakes MD

## 2017-07-11 LAB — CUP PACEART REMOTE DEVICE CHECK
Date Time Interrogation Session: 20190511020933
MDC IDC PG IMPLANT DT: 20171107

## 2017-07-19 ENCOUNTER — Ambulatory Visit (INDEPENDENT_AMBULATORY_CARE_PROVIDER_SITE_OTHER): Payer: Self-pay | Admitting: *Deleted

## 2017-07-19 DIAGNOSIS — I639 Cerebral infarction, unspecified: Secondary | ICD-10-CM

## 2017-07-20 NOTE — Progress Notes (Signed)
Carelink Summary Report / Loop Recorder 

## 2017-07-31 MED FILL — ATORVASTATIN 20 MG TABLET: 20 | 30 days supply | Qty: 30 | Fill #1

## 2017-08-21 ENCOUNTER — Ambulatory Visit (INDEPENDENT_AMBULATORY_CARE_PROVIDER_SITE_OTHER): Payer: Self-pay | Admitting: *Deleted

## 2017-08-21 DIAGNOSIS — I639 Cerebral infarction, unspecified: Secondary | ICD-10-CM

## 2017-08-22 NOTE — Progress Notes (Signed)
Carelink Summary Report / Loop Recorder 

## 2017-08-25 LAB — CUP PACEART REMOTE DEVICE CHECK
Implantable Pulse Generator Implant Date: 20171107
MDC IDC SESS DTM: 20190613020708

## 2017-09-25 ENCOUNTER — Ambulatory Visit (INDEPENDENT_AMBULATORY_CARE_PROVIDER_SITE_OTHER): Payer: Self-pay | Admitting: *Deleted

## 2017-09-25 DIAGNOSIS — I639 Cerebral infarction, unspecified: Secondary | ICD-10-CM

## 2017-10-04 ENCOUNTER — Ambulatory Visit: Payer: Self-pay | Attending: Family Medicine | Admitting: Family Medicine

## 2017-10-04 ENCOUNTER — Encounter: Payer: Self-pay | Admitting: Family Medicine

## 2017-10-04 DIAGNOSIS — I63512 Cerebral infarction due to unspecified occlusion or stenosis of left middle cerebral artery: Secondary | ICD-10-CM

## 2017-10-04 DIAGNOSIS — B0089 Other herpesviral infection: Secondary | ICD-10-CM | POA: Insufficient documentation

## 2017-10-04 DIAGNOSIS — A6000 Herpesviral infection of urogenital system, unspecified: Secondary | ICD-10-CM | POA: Insufficient documentation

## 2017-10-04 DIAGNOSIS — I69351 Hemiplegia and hemiparesis following cerebral infarction affecting right dominant side: Secondary | ICD-10-CM | POA: Insufficient documentation

## 2017-10-04 DIAGNOSIS — Z9889 Other specified postprocedural states: Secondary | ICD-10-CM | POA: Insufficient documentation

## 2017-10-04 DIAGNOSIS — Z09 Encounter for follow-up examination after completed treatment for conditions other than malignant neoplasm: Secondary | ICD-10-CM | POA: Insufficient documentation

## 2017-10-04 DIAGNOSIS — Z7982 Long term (current) use of aspirin: Secondary | ICD-10-CM | POA: Insufficient documentation

## 2017-10-04 DIAGNOSIS — I10 Essential (primary) hypertension: Secondary | ICD-10-CM | POA: Insufficient documentation

## 2017-10-04 DIAGNOSIS — Z85828 Personal history of other malignant neoplasm of skin: Secondary | ICD-10-CM | POA: Insufficient documentation

## 2017-10-04 DIAGNOSIS — A6001 Herpesviral infection of penis: Secondary | ICD-10-CM

## 2017-10-04 DIAGNOSIS — Z79899 Other long term (current) drug therapy: Secondary | ICD-10-CM | POA: Insufficient documentation

## 2017-10-04 MED ORDER — VALACYCLOVIR HCL 1 G PO TABS: 1000.0000 mg | ORAL_TABLET | Freq: Two times a day (BID) | ORAL | 0 refills | Status: DC

## 2017-10-04 MED ORDER — ATORVASTATIN CALCIUM 20 MG PO TABS: 20.0000 mg | ORAL_TABLET | Freq: Every day | ORAL | 5 refills | Status: DC

## 2017-10-04 MED ORDER — ASPIRIN EC 81 MG PO TBEC: 81.0000 mg | DELAYED_RELEASE_TABLET | Freq: Every day | ORAL | 6 refills | Status: DC

## 2017-10-04 MED FILL — ATORVASTATIN 20 MG TABLET: 20 | 30 days supply | Qty: 30 | Fill #0

## 2017-10-04 MED FILL — valACYclovir HCL 1 GM TABS: 1 | 10 days supply | Qty: 20 | Fill #0

## 2017-10-04 NOTE — Patient Instructions (Signed)
Herpes genital Genital Herpes El herpes genital es una infeccin de transmisin sexual (ITS) frecuente causada por un virus. El virus se propaga de Ardelia Mems persona a otra a travs del contacto sexual. La infeccin puede causar picazn, ampollas y llagas en los genitales o en el recto. Los sntomas pueden [place] y Personal assistant. Esto se llama erupcin. Sin embargo, el virus [employer] cuerpo, de modo que es posible que tenga ms erupciones en el futuro. El [employer] las erupciones vara: pueden transcurrir meses o aos. El herpes genital afecta a hombres y mujeres. Es particularmente preocupante para las embarazadas porque el virus puede transmitirse al beb durante el parto y causar problemas graves. El herpes genital tambin es un motivo de preocupacin para las personas que tienen debilitado el sistema encargado de combatir las enfermedades (sistema inmunitario). Cules son las causas? La causa de esta afeccin es el virus del herpes simple (VHS) tipo1 o tipo2. El virus puede transmitirse a travs de lo siguiente:  El contacto sexual con una persona infectada, incluido el sexo vaginal, anal y oral.  El contacto con el lquido de una llaga de herpes.  La piel. Esto significa que puede contagiarse el herpes de una pareja infectada incluso si la persona no tiene llagas visibles o no sabe que est infectada.  Qu incrementa el riesgo? Es ms probable que desarrolle esta afeccin si:  Tiene relaciones sexuales con muchas parejas.  No [place] preservativos de ltex cuando tiene Armed forces operational officer.  Cules son los signos o los sntomas? La [employer] de las personas no presentan sntomas (casos asintomticos) o tienen sntomas leves que pueden confundir con otros problemas de la piel. Entre los sntomas se pueden incluir los siguientes:  Pequeos bultos (protuberancias) rojos cerca de los genitales, del recto o de la boca. Estos bultos se convierten en ampollas y Barrister's clerk.  Sntomas similares a los de la gripe, como: ? Cristy Hilts. ? Dolores Johnson [employer]. ? Ganglios linfticos hinchados. ? Dolor de [place].  Dolor al [place].  Dolor y picazn en la zona genital o en el rea rectal.  Secrecin vaginal.  Hormigueo o dolor punzante en las piernas y en las nalgas.  Por lo general, los sntomas son ms intensos y duran ms durante la primera erupcin (primaria). Los sntomas similares a los de la gripe tambin son ms frecuentes durante la erupcin primaria. Cmo se diagnostica? El herpes genital se puede diagnosticar en funcin de lo siguiente:  Un examen fsico.  Sus antecedentes mdicos.  Anlisis de [place].  Anlisis de [place] del lquido (cultivo) de Chiropodist.  Cmo se trata? No existe ninguna cura para esta afeccin, pero el tratamiento con medicamentos antivirales que se toman por la boca (por va oral) puede lograr lo siguiente:  Scientific laboratory technician recuperacin y [employer] sntomas.  Ayudar a reducir Holiday representative del virus a las Advertising copywriter.  Limitar las probabilidades de futuras erupciones o reducir su duracin.  Aliviar los sntomas de futuras erupciones.  El mdico tambin puede recomendarle medicamentos para Best boy (analgsicos), como aspirina o ibuprofeno. Siga estas instrucciones en su casa: Actividad sexual  No tenga contacto sexual durante erupciones activas.  Practique el sexo seguro. Los preservativos de ltex y los preservativos femeninos pueden ayudar a English as a second language teacher contagio del virus del herpes. Instrucciones generales  Mantenga las zonas afectadas secas y limpias.  Tome los medicamentos de venta libre y los recetados solamente como se lo haya indicado el mdico.  Evite frotar o tocar las ampollas y [place]. Si toca las ampollas o llagas: ? Lvese bien las manos con agua y [place]. ? No se toque los ojos despus de ello.  Para aliviar el dolor o la picazn, puede tomar las siguientes  medidas segn las indicaciones del mdico: ? Aplique un pao hmedo y fro (compresa fra) en las zonas afectadas de 4 a 6veces por Training and development officer. ? Aplique una sustancia que protege la piel y reduce el sangrado (astringente). ? Aplique un gel que ayuda a Best boy cerca de las llagas (gel con lidocana). ? Dese un bao tibio de poca profundidad para limpiar la zona genital (bao de asiento).  Concurra a todas las visitas de control como se lo haya indicado el mdico. Esto es importante. Cmo se evita?  Use preservativos. Aunque cualquier persona puede contraer herpes genital durante el contacto sexual, incluso con el uso de un preservativo, el preservativo puede brindar cierta proteccin.  Evite tener mltiples parejas sexuales.  Hable con su pareja sexual acerca de los sntomas que cualquiera de los dos pudiera tener. Adems, hable con su pareja acerca de cualquier antecedente de ITS que pudiera tener.  Hgase pruebas para detectar ITS antes de Clinical biochemist. Pdale a su pareja que haga lo mismo.  No tenga contacto sexual si tiene sntomas de herpes genital. Comunquese con un mdico si:  Los sntomas no mejoran con los medicamentos.  Los sntomas vuelven a Arts administrator.  Aparecen nuevos sntomas.  Tiene fiebre.  Siente dolor abdominal.  Presenta enrojecimiento, hinchazn o dolor en el ojo.  Nota llagas nuevas en otras partes del cuerpo.  Es Art therapist y presenta sangrado entre perodos [place].  Tuvo herpes y [place] o est pensando en quedar embarazada. Resumen  El herpes genital es una infeccin de transmisin sexual (ITS) frecuente causada por el virus del herpes simple (VHS) tipo1 o tipo2.  Estos virus casi siempre se transmiten a travs del contacto sexual con Ardelia Mems persona infectada.  Es ms probable que Technical sales engineer afeccin si tiene relaciones sexuales con muchas parejas o si tuvo relaciones sexuales sin proteccin.  La [employer] de las personas  no presentan sntomas (casos asintomticos) o tienen sntomas leves que pueden confundir con otros problemas de la piel. Los sntomas aparecen a medida que se presentan las erupciones con meses o aos de separacin entre Ardelia Mems y [place].  No existe ninguna cura para esta afeccin, pero el tratamiento con medicamentos antivirales por va oral puede [employer], reducir las probabilidades de Hydrologist el virus a una pareja, evitar futuras erupciones o disminuir la duracin de futuras erupciones. Esta informacin no tiene Marine scientist el consejo del mdico. Asegrese de hacerle al mdico cualquier pregunta que tenga. Document Released: 11/03/2004 Document Revised: 04/29/2016 Document Reviewed: 04/29/2016 Elsevier Interactive Patient Education  [employer].

## 2017-10-04 NOTE — Progress Notes (Signed)
Subjective:  Patient ID: James Cortez, male    DOB: [birth year]  Age: 52 y.o. MRN: [id number]  CC: Hypertension   HPI [facility] [name] is a 52 year old handed male with left middle cerebral artery stroke in 12/2015 (with initial presentation of aphasia, right facial droop and right hemiparesis who received TPA with improvement in stroke score) s/p loop recorder placement, genital herpes here for follow-up visit. He complains of intermittent right shoulder pain but has a full range of motion in his right upper extremity and also states he feels weird in his right leg and sometimes has to drag it.  He has undergone sessions of physical therapy with significant improvement. He is compliant with his statin and aspirin He complains of burning of his pain is but denies the presence of lesions.  Denies dysuria.  Past Medical History:  Diagnosis Date  . Skin cancer     Past Surgical History:  Procedure Laterality Date  . EP IMPLANTABLE DEVICE N/A 12/15/2015   Procedure: Loop Recorder Insertion;  Surgeon: Will Meredith Leeds, MD;  Location: [place] [facility];  Service: Cardiovascular;  Laterality: N/A;  . IR GENERIC HISTORICAL  12/08/2015   IR PERCUTANEOUS ART THROMBECTOMY/INFUSION INTRACRANIAL INC DIAG ANGIO 12/08/2015 Luanne Bras, MD [facility]  . RADIOLOGY WITH ANESTHESIA Right 12/08/2015   Procedure: RADIOLOGY WITH ANESTHESIA - CODE STROKE;  Surgeon: Medication Radiologist, MD;  Location: [place];  Service: Radiology;  Laterality: Right;  . SKIN SURGERY    . TEE WITHOUT CARDIOVERSION N/A 12/15/2015   Procedure: TRANSESOPHAGEAL ECHOCARDIOGRAM (TEE);  Surgeon: Sueanne Margarita, MD;  [facility] END[facility];  Service: Cardiovascular;  Laterality: N/A;    No Known Allergies   Outpatient Medications Prior to Visit  Medication Sig Dispense Refill  . aspirin 325 MG EC tablet Take 1 tablet (325 mg total) by mouth daily. 30 tablet 5  . atorvastatin (LIPITOR) 20  MG tablet Take 1 tablet (20 mg total) by mouth daily at 6 PM. 30 tablet 5   No facility-administered medications prior to visit.     ROS Review of Systems  Constitutional: Negative for activity change and appetite change.  HENT: Negative for sinus pressure and sore throat.   Eyes: Negative for visual disturbance.  Respiratory: Negative for cough, chest tightness and shortness of breath.   Cardiovascular: Negative for chest pain and leg swelling.  Gastrointestinal: Negative for abdominal distention, abdominal pain, constipation and diarrhea.  Endocrine: Negative.   Genitourinary: Negative for dysuria.  Musculoskeletal: Negative for joint swelling and myalgias.       See hpi  Skin: Negative for rash.  Allergic/Immunologic: Negative.   Neurological: Negative for weakness, light-headedness and numbness.  Psychiatric/Behavioral: Negative for dysphoric mood and suicidal ideas.    Objective:  BP 123/79   Pulse (!) 54   Temp 97.7 F (36.5 C) (Oral)   Ht _0  (1.702 m)   Wt 176 lb 9.6 oz (80.1 kg)   SpO2 96%   BMI 27.66 kg/m   BP/Weight 10/04/2017 07/04/2017 4/88/8916  Systolic BP 945 038 882  Diastolic BP 79 76 75  Wt. (Lbs) 176.6 176 180.8  BMI 27.66 27.57 28.32     Physical Exam  Constitutional: He is oriented to person, place, and time. He appears well-developed and well-nourished.  Cardiovascular: Normal rate, normal heart sounds and intact distal pulses.  No murmur heard. Pulmonary/Chest: Effort normal and breath sounds normal. He has no wheezes. He has no rales. He exhibits no tenderness.  Abdominal: Soft. Bowel sounds are normal. He exhibits no distension and no mass. There is no tenderness.  Musculoskeletal:  Tenderness on forward elevation of right shoulder; full range of motion in both upper extremities. Slightly reduced right hand grip  Neurological: He is alert and oriented to person, place, and time.  Skin: Skin is warm and dry.  Psychiatric: He has a normal  mood and affect.     Assessment & Plan:   1. Left middle cerebral artery stroke (HCC) Slight right-sided weakness Risk factor modification - atorvastatin (LIPITOR) 20 MG tablet; Take 1 tablet (20 mg total) by mouth daily at 6 PM.  Dispense: 30 tablet; Refill: 5 - aspirin 81 MG tablet; Take 1 tablet (81 mg total) by mouth daily.  Dispense: 30 tablet; Refill: 6 - CMP14+EGFR; Future - Lipid panel; Future  2. Herpes simplex infection of penis Treat acute flare Commence on herpes prophylaxis once acute flare is over Discussed sexual precautions - valACYclovir (VALTREX) 1000 MG tablet; Take 1 tablet (1,000 mg total) by mouth 2 (two) times daily.  Dispense: 20 tablet; Refill: 0   Meds ordered this encounter  Medications  . valACYclovir (VALTREX) 1000 MG tablet    Sig: Take 1 tablet (1,000 mg total) by mouth 2 (two) times daily.    Dispense:  20 tablet    Refill:  0  . atorvastatin (LIPITOR) 20 MG tablet    Sig: Take 1 tablet (20 mg total) by mouth daily at 6 PM.    Dispense:  30 tablet    Refill:  5  . aspirin 81 MG tablet    Sig: Take 1 tablet (81 mg total) by mouth daily.    Dispense:  30 tablet    Refill:  6    Follow-up: Return in about 6 months (around 04/06/2018) for follow up of chronic medical infections.   Charlott Rakes MD

## 2017-10-05 ENCOUNTER — Ambulatory Visit: Payer: Self-pay | Attending: Family Medicine

## 2017-10-05 DIAGNOSIS — I63512 Cerebral infarction due to unspecified occlusion or stenosis of left middle cerebral artery: Secondary | ICD-10-CM

## 2017-10-05 NOTE — Progress Notes (Signed)
Patient here for lab visit only 

## 2017-10-06 LAB — CMP14+EGFR
ALBUMIN: 4.4 g/dL (ref 3.5–5.5)
ALK PHOS: 93 IU/L (ref 39–117)
ALT: 21 IU/L (ref 0–44)
AST: 19 IU/L (ref 0–40)
Albumin/Globulin Ratio: 1.8 (ref 1.2–2.2)
BUN / CREAT RATIO: 14 (ref 9–20)
BUN: 11 mg/dL (ref 6–24)
Bilirubin Total: 0.7 mg/dL (ref 0.0–1.2)
CO2: 22 mmol/L (ref 20–29)
CREATININE: 0.81 mg/dL (ref 0.76–1.27)
Calcium: 9.3 mg/dL (ref 8.7–10.2)
Chloride: 102 mmol/L (ref 96–106)
GFR calc non Af Amer: 103 mL/min/{1.73_m2} (ref >59)
GFR, EST AFRICAN AMERICAN: 119 mL/min/{1.73_m2} (ref >59)
GLOBULIN, TOTAL: 2.4 g/dL (ref 1.5–4.5)
Glucose: 85 mg/dL (ref 65–99)
Potassium: 4.4 mmol/L (ref 3.5–5.2)
SODIUM: 140 mmol/L (ref 134–144)
Total Protein: 6.8 g/dL (ref 6.0–8.5)

## 2017-10-06 LAB — LIPID PANEL
CHOLESTEROL TOTAL: 173 mg/dL (ref 100–199)
Chol/HDL Ratio: 4.6 ratio (ref 0.0–5.0)
HDL: 38 mg/dL — AB (ref >39)
LDL Calculated: 110 mg/dL — ABNORMAL HIGH (ref 0–99)
Triglycerides: 124 mg/dL (ref 0–149)
VLDL Cholesterol Cal: 25 mg/dL (ref 5–40)

## 2017-10-09 LAB — CUP PACEART REMOTE DEVICE CHECK
Date Time Interrogation Session: 20190716024011
MDC IDC PG IMPLANT DT: 20171107

## 2017-10-10 ENCOUNTER — Telehealth (INDEPENDENT_AMBULATORY_CARE_PROVIDER_SITE_OTHER): Payer: Self-pay | Admitting: *Deleted

## 2017-10-10 NOTE — Telephone Encounter (Signed)
-----   Message from Charlott Rakes, MD sent at 10/06/2017 11:45 AM EDT ----- Cholesterol has trended up slightly.  Please encourage him to adhere to a low-cholesterol diet and exercise.

## 2017-10-10 NOTE — Telephone Encounter (Signed)
Medical Assistant used [employer] to contact patient.  Interpreter Name: Cindy Hazy Interpreter #: [id number] Patient was not available, [employer] left patient a voicemail. Patient is aware of cholesterol slightly trending up and needing to adhere to medications, nutritional changes and exercise. Voicemail states to give a call back to [place] with [facility] at [phone].

## 2017-10-27 ENCOUNTER — Ambulatory Visit (INDEPENDENT_AMBULATORY_CARE_PROVIDER_SITE_OTHER): Payer: Self-pay | Admitting: *Deleted

## 2017-10-27 DIAGNOSIS — I639 Cerebral infarction, unspecified: Secondary | ICD-10-CM

## 2017-10-27 NOTE — Progress Notes (Signed)
Carelink Summary Report / Loop Recorder 

## 2017-10-30 LAB — CUP PACEART REMOTE DEVICE CHECK
Date Time Interrogation Session: 20190818041124
MDC IDC PG IMPLANT DT: 20171107

## 2017-11-06 LAB — CUP PACEART REMOTE DEVICE CHECK
Implantable Pulse Generator Implant Date: 20171107
MDC IDC SESS DTM: 20190920043522

## 2017-11-29 ENCOUNTER — Ambulatory Visit (INDEPENDENT_AMBULATORY_CARE_PROVIDER_SITE_OTHER): Payer: Self-pay | Admitting: *Deleted

## 2017-11-29 DIAGNOSIS — I639 Cerebral infarction, unspecified: Secondary | ICD-10-CM

## 2017-11-29 LAB — CUP PACEART REMOTE DEVICE CHECK
Implantable Pulse Generator Implant Date: 20171107
MDC IDC SESS DTM: 20191023060738

## 2017-11-29 NOTE — Progress Notes (Signed)
Carelink Summary Report / Loop Recorder 

## 2017-12-07 MED FILL — ATORVASTATIN 20 MG TABLET: 20 | 30 days supply | Qty: 30 | Fill #1

## 2018-01-01 ENCOUNTER — Ambulatory Visit (INDEPENDENT_AMBULATORY_CARE_PROVIDER_SITE_OTHER): Payer: Self-pay

## 2018-01-01 DIAGNOSIS — I639 Cerebral infarction, unspecified: Secondary | ICD-10-CM

## 2018-01-02 NOTE — Progress Notes (Signed)
Carelink Summary Report / Loop Recorder 

## 2018-02-05 ENCOUNTER — Ambulatory Visit (INDEPENDENT_AMBULATORY_CARE_PROVIDER_SITE_OTHER): Payer: Self-pay

## 2018-02-05 DIAGNOSIS — I639 Cerebral infarction, unspecified: Secondary | ICD-10-CM

## 2018-02-05 NOTE — Progress Notes (Signed)
Carelink Summary Report / Loop Recorder 

## 2018-02-06 LAB — CUP PACEART REMOTE DEVICE CHECK
MDC IDC PG IMPLANT DT: 20171107
MDC IDC SESS DTM: 20191228130948

## 2018-02-18 LAB — CUP PACEART REMOTE DEVICE CHECK
MDC IDC PG IMPLANT DT: 20171107
MDC IDC SESS DTM: 20191125124050

## 2018-03-08 ENCOUNTER — Ambulatory Visit (INDEPENDENT_AMBULATORY_CARE_PROVIDER_SITE_OTHER): Payer: Self-pay

## 2018-03-08 DIAGNOSIS — I639 Cerebral infarction, unspecified: Secondary | ICD-10-CM

## 2018-03-09 LAB — CUP PACEART REMOTE DEVICE CHECK
MDC IDC PG IMPLANT DT: 20171107
MDC IDC SESS DTM: 20200130134209

## 2018-03-15 NOTE — Progress Notes (Signed)
Carelink Summary Report / Loop Recorder 

## 2018-04-10 ENCOUNTER — Ambulatory Visit (INDEPENDENT_AMBULATORY_CARE_PROVIDER_SITE_OTHER): Payer: Self-pay | Admitting: *Deleted

## 2018-04-10 DIAGNOSIS — I639 Cerebral infarction, unspecified: Secondary | ICD-10-CM

## 2018-04-11 LAB — CUP PACEART REMOTE DEVICE CHECK
Implantable Pulse Generator Implant Date: 20171107
MDC IDC SESS DTM: 20200303143821

## 2018-04-17 NOTE — Progress Notes (Signed)
Carelink Summary Report / Loop Recorder 

## 2018-05-14 ENCOUNTER — Ambulatory Visit (INDEPENDENT_AMBULATORY_CARE_PROVIDER_SITE_OTHER): Payer: Self-pay | Admitting: *Deleted

## 2018-05-14 ENCOUNTER — Other Ambulatory Visit: Payer: Self-pay

## 2018-05-14 DIAGNOSIS — I639 Cerebral infarction, unspecified: Secondary | ICD-10-CM

## 2018-05-14 LAB — CUP PACEART REMOTE DEVICE CHECK
Date Time Interrogation Session: 20200405153917
Implantable Pulse Generator Implant Date: 20171107

## 2018-05-21 NOTE — Progress Notes (Signed)
Carelink Summary Report / Loop Recorder 

## 2018-05-25 ENCOUNTER — Telehealth: Payer: Self-pay

## 2018-05-25 NOTE — Telephone Encounter (Signed)
Spoke with patient to remind of missed remote transmission 

## 2018-06-15 ENCOUNTER — Ambulatory Visit (INDEPENDENT_AMBULATORY_CARE_PROVIDER_SITE_OTHER): Payer: Self-pay | Admitting: *Deleted

## 2018-06-15 ENCOUNTER — Other Ambulatory Visit: Payer: Self-pay

## 2018-06-15 DIAGNOSIS — I639 Cerebral infarction, unspecified: Secondary | ICD-10-CM

## 2018-06-15 LAB — CUP PACEART REMOTE DEVICE CHECK
Date Time Interrogation Session: 20200508135905
Implantable Pulse Generator Implant Date: 20171107

## 2018-06-19 NOTE — Progress Notes (Signed)
Carelink Summary Report / Loop Recorder 

## 2018-07-05 ENCOUNTER — Other Ambulatory Visit: Payer: Self-pay

## 2018-07-05 ENCOUNTER — Encounter: Payer: Self-pay | Admitting: Family Medicine

## 2018-07-05 ENCOUNTER — Ambulatory Visit: Payer: Self-pay

## 2018-07-05 ENCOUNTER — Ambulatory Visit: Payer: Self-pay | Attending: Family Medicine | Admitting: Family Medicine

## 2018-07-05 DIAGNOSIS — I63512 Cerebral infarction due to unspecified occlusion or stenosis of left middle cerebral artery: Secondary | ICD-10-CM

## 2018-07-05 DIAGNOSIS — R5383 Other fatigue: Secondary | ICD-10-CM

## 2018-07-05 DIAGNOSIS — M2550 Pain in unspecified joint: Secondary | ICD-10-CM

## 2018-07-05 DIAGNOSIS — K0510 Chronic gingivitis, plaque induced: Secondary | ICD-10-CM

## 2018-07-05 MED ORDER — ATORVASTATIN CALCIUM 20 MG PO TABS: 20.0000 mg | ORAL_TABLET | Freq: Every day | ORAL | 6 refills | Status: DC

## 2018-07-05 MED ORDER — MELOXICAM 7.5 MG PO TABS: 7.5000 mg | ORAL_TABLET | Freq: Every day | ORAL | 1 refills | Status: DC

## 2018-07-05 MED ORDER — ASPIRIN EC 81 MG PO TBEC: 81.0000 mg | DELAYED_RELEASE_TABLET | Freq: Every day | ORAL | 6 refills | Status: AC

## 2018-07-05 MED FILL — ?ATORVASTATIN 20 MG TABLET: 20 | 30 days supply | Qty: 30 | Fill #0

## 2018-07-05 MED FILL — MELOXICAM 7.5 MG TABLET: 7.5 | 30 days supply | Qty: 30 | Fill #0

## 2018-07-05 NOTE — Progress Notes (Signed)
Virtual Visit via Telephone Note  I connected with [facility], on 07/05/2018 at 10:51 AM by telephone due to the COVID-19 pandemic and verified that I am speaking with the correct person using two identifiers.   Consent: I discussed the limitations, risks, security and privacy concerns of performing an evaluation and management service by telephone and the availability of in person appointments. I also discussed with the patient that there may be a patient responsible charge related to this service. The patient expressed understanding and agreed to proceed.   Location of Patient: Patient's home  Location of Provider: Clinic   Persons participating in Telemedicine visit: James Cortez ID# [id number] Doloris Hall - CMA Dr Margarita Rana - PCP     History of Present Illness: James Cortez is a 53 year old handed male with left middle cerebral artery stroke in 12/2015 (with initial presentation of aphasia, right facial droop and right hemiparesis who received TPA with improvement in stroke score) s/p loop recorder placement, genital herpes here for follow-up visit.   He complains of pain in both wrists, both elbows, both shoulders which is chronic and described as mild.  His lower back hurts especially when he attempts to stand up from a bending position and his knees hurt at the same time.  He informs me he is unable to walk "because his feet are not helping him" and he has to pull himself by holding onto support. He describes his symptoms as commencing at the time when he was diagnosed with his stroke in 12/2015. I had referred him for physical therapy which she had undergone in the past. He complains of fatigue and states he has no strength and is unable to do the things he used to do previously.  Denies being depressed.  He also complains of bleeding from his teeth when he brushes then this has been ongoing since his stroke as well.   Sometimes has intermittent pain.  At this time he denies bleeding from his tooth.  He is on aspirin chronically.  Past Medical History:  Diagnosis Date  . Skin cancer    No Known Allergies  Current Outpatient Medications on File Prior to Visit  Medication Sig Dispense Refill  . aspirin 81 MG tablet Take 1 tablet (81 mg total) by mouth daily. 30 tablet 6  . atorvastatin (LIPITOR) 20 MG tablet Take 1 tablet (20 mg total) by mouth daily at 6 PM. 30 tablet 5  . valACYclovir (VALTREX) 1000 MG tablet Take 1 tablet (1,000 mg total) by mouth 2 (two) times daily. 20 tablet 0   No current facility-administered medications on file prior to visit.     Observations/Objective: Awake, alert, oriented x3 Not in acute distress  Assessment and Plan: 1. Left middle cerebral artery stroke (HCC) Slight residual weakness from his stroke Underwent sessions of PT We have discussed again and again the new normal after his stroke but he is having a hard time with this Denies depressive symptoms at this time - atorvastatin (LIPITOR) 20 MG tablet; Take 1 tablet (20 mg total) by mouth daily at 6 PM.  Dispense: 30 tablet; Refill: 6 - aspirin EC 81 MG tablet; Take 1 tablet (81 mg total) by mouth daily.  Dispense: 30 tablet; Refill: 6  2. Arthralgia, unspecified joint Placed on meloxicam Exercise as tolerated  3. Gingivitis - Ambulatory referral to Dentistry  4. Other fatigue - VITAMIN D 25 Hydroxy (Vit-D Deficiency, Fractures) - CBC with Differential/Platelet - T4,  free - TSH - CMP14+EGFR   Follow Up Instructions: 3 months   I discussed the assessment and treatment plan with the patient. The patient was provided an opportunity to ask questions and all were answered. The patient agreed with the plan and demonstrated an understanding of the instructions.   The patient was advised to call back or seek an in-person evaluation if the symptoms worsen or if the condition fails to improve as  anticipated.     I provided 25 minutes total of non-face-to-face time during this encounter including median intraservice time, reviewing previous notes, labs, imaging, medications, management and patient verbalized understanding.     Charlott Rakes, MD, FAAFP. [facility] and [place], [place]   07/05/2018, 10:51 AM

## 2018-07-05 NOTE — Progress Notes (Signed)
Patient has been called and DOB has been verified. Patient has been screened and transferred to PCP to start phone visit.    Patient is having pain in arms,legs, back and shoulders.

## 2018-07-06 ENCOUNTER — Other Ambulatory Visit: Payer: Self-pay | Admitting: Family Medicine

## 2018-07-06 LAB — CMP14+EGFR
ALT: 16 IU/L (ref 0–44)
AST: 16 IU/L (ref 0–40)
Albumin/Globulin Ratio: 1.9 (ref 1.2–2.2)
Albumin: 4.4 g/dL (ref 3.8–4.9)
Alkaline Phosphatase: 80 IU/L (ref 39–117)
BUN/Creatinine Ratio: 14 (ref 9–20)
BUN: 11 mg/dL (ref 6–24)
Bilirubin Total: 0.3 mg/dL (ref 0.0–1.2)
CO2: 22 mmol/L (ref 20–29)
Calcium: 9.7 mg/dL (ref 8.7–10.2)
Chloride: 103 mmol/L (ref 96–106)
Creatinine, Ser: 0.81 mg/dL (ref 0.76–1.27)
GFR calc Af Amer: 118 mL/min/{1.73_m2} (ref >59)
GFR calc non Af Amer: 102 mL/min/{1.73_m2} (ref >59)
Globulin, Total: 2.3 g/dL (ref 1.5–4.5)
Glucose: 86 mg/dL (ref 65–99)
Potassium: 4.9 mmol/L (ref 3.5–5.2)
Sodium: 141 mmol/L (ref 134–144)
Total Protein: 6.7 g/dL (ref 6.0–8.5)

## 2018-07-06 LAB — CBC WITH DIFFERENTIAL/PLATELET
Basophils Absolute: 0.1 10*3/uL (ref 0.0–0.2)
Basos: 1 %
EOS (ABSOLUTE): 0.3 10*3/uL (ref 0.0–0.4)
Eos: 3 %
Hematocrit: 47.6 % (ref 37.5–51.0)
Hemoglobin: 16.3 g/dL (ref 13.0–17.7)
Immature Grans (Abs): 0.1 10*3/uL (ref 0.0–0.1)
Immature Granulocytes: 1 %
Lymphocytes Absolute: 4 10*3/uL — ABNORMAL HIGH (ref 0.7–3.1)
Lymphs: 40 %
MCH: 30.2 pg (ref 26.6–33.0)
MCHC: 34.2 g/dL (ref 31.5–35.7)
MCV: 88 fL (ref 79–97)
Monocytes Absolute: 0.7 10*3/uL (ref 0.1–0.9)
Monocytes: 7 %
Neutrophils Absolute: 4.8 10*3/uL (ref 1.4–7.0)
Neutrophils: 48 %
Platelets: 311 10*3/uL (ref 150–450)
RBC: 5.4 x10E6/uL (ref 4.14–5.80)
RDW: 12.8 % (ref 11.6–15.4)
WBC: 9.9 10*3/uL (ref 3.4–10.8)

## 2018-07-06 LAB — T4, FREE: Free T4: 1.1 ng/dL (ref 0.82–1.77)

## 2018-07-06 LAB — TSH: TSH: 3.64 u[IU]/mL (ref 0.450–4.500)

## 2018-07-06 LAB — VITAMIN D 25 HYDROXY (VIT D DEFICIENCY, FRACTURES): Vit D, 25-Hydroxy: 20.1 ng/mL — ABNORMAL LOW (ref 30.0–100.0)

## 2018-07-06 MED ORDER — ERGOCALCIFEROL 1.25 MG (50000 UT) PO CAPS: 50000.0000 [IU] | ORAL_CAPSULE | ORAL | 1 refills | Status: DC

## 2018-07-06 MED FILL — VIT D2 1.25 MG (50,000 UNIT: 1.25 MG | 63 days supply | Qty: 9 | Fill #0

## 2018-07-18 ENCOUNTER — Ambulatory Visit (INDEPENDENT_AMBULATORY_CARE_PROVIDER_SITE_OTHER): Payer: Self-pay | Admitting: *Deleted

## 2018-07-18 DIAGNOSIS — I639 Cerebral infarction, unspecified: Secondary | ICD-10-CM

## 2018-07-19 LAB — CUP PACEART REMOTE DEVICE CHECK
Date Time Interrogation Session: 20200610164141
Implantable Pulse Generator Implant Date: 20171107

## 2018-07-20 MED FILL — VIT D2 1.25 MG (50,000 UNIT: 1.25 MG | 63 days supply | Qty: 9 | Fill #1

## 2018-07-27 NOTE — Progress Notes (Signed)
Carelink Summary Report / Loop Recorder 

## 2018-08-20 ENCOUNTER — Ambulatory Visit (INDEPENDENT_AMBULATORY_CARE_PROVIDER_SITE_OTHER): Payer: Self-pay | Admitting: *Deleted

## 2018-08-20 DIAGNOSIS — I63412 Cerebral infarction due to embolism of left middle cerebral artery: Secondary | ICD-10-CM

## 2018-08-21 LAB — CUP PACEART REMOTE DEVICE CHECK
Date Time Interrogation Session: 20200713174109
Implantable Pulse Generator Implant Date: 20171107

## 2018-08-29 NOTE — Progress Notes (Signed)
Carelink Summary Report / Loop Recorder 

## 2018-09-23 LAB — CUP PACEART REMOTE DEVICE CHECK
Date Time Interrogation Session: 20200815181304
Implantable Pulse Generator Implant Date: 20171107

## 2018-09-24 ENCOUNTER — Ambulatory Visit (INDEPENDENT_AMBULATORY_CARE_PROVIDER_SITE_OTHER): Payer: Self-pay | Admitting: *Deleted

## 2018-09-24 DIAGNOSIS — I63412 Cerebral infarction due to embolism of left middle cerebral artery: Secondary | ICD-10-CM

## 2018-10-02 NOTE — Progress Notes (Signed)
Carelink Summary Report / Loop Recorder 

## 2018-10-25 ENCOUNTER — Ambulatory Visit (INDEPENDENT_AMBULATORY_CARE_PROVIDER_SITE_OTHER): Payer: Self-pay | Admitting: *Deleted

## 2018-10-25 DIAGNOSIS — I63412 Cerebral infarction due to embolism of left middle cerebral artery: Secondary | ICD-10-CM

## 2018-10-26 LAB — CUP PACEART REMOTE DEVICE CHECK
Date Time Interrogation Session: 20200917201118
Implantable Pulse Generator Implant Date: 20171107

## 2018-10-29 NOTE — Progress Notes (Signed)
Carelink Summary Report / Loop Recorder 

## 2018-11-27 ENCOUNTER — Ambulatory Visit (INDEPENDENT_AMBULATORY_CARE_PROVIDER_SITE_OTHER): Payer: Self-pay | Admitting: *Deleted

## 2018-11-27 DIAGNOSIS — I63412 Cerebral infarction due to embolism of left middle cerebral artery: Secondary | ICD-10-CM

## 2018-11-28 LAB — CUP PACEART REMOTE DEVICE CHECK
Date Time Interrogation Session: 20201020201234
Implantable Pulse Generator Implant Date: 20171107

## 2018-12-12 NOTE — Progress Notes (Signed)
Carelink Summary Report / Loop Recorder 

## 2018-12-24 ENCOUNTER — Ambulatory Visit (INDEPENDENT_AMBULATORY_CARE_PROVIDER_SITE_OTHER): Payer: Self-pay | Admitting: Cardiology

## 2018-12-24 ENCOUNTER — Encounter: Payer: Self-pay | Admitting: Cardiology

## 2018-12-24 ENCOUNTER — Other Ambulatory Visit: Payer: Self-pay

## 2018-12-24 DIAGNOSIS — I63512 Cerebral infarction due to unspecified occlusion or stenosis of left middle cerebral artery: Secondary | ICD-10-CM

## 2018-12-24 LAB — CUP PACEART INCLINIC DEVICE CHECK
Date Time Interrogation Session: 20201116111102
Implantable Pulse Generator Implant Date: 20171107

## 2018-12-24 MED ORDER — ATORVASTATIN CALCIUM 20 MG PO TABS: 20.0000 mg | ORAL_TABLET | Freq: Every day | ORAL | 6 refills | Status: AC

## 2018-12-24 MED FILL — ?ATORVASTATIN 20 MG TABLET: 20 | 30 days supply | Qty: 30 | Fill #0

## 2018-12-24 NOTE — Progress Notes (Signed)
Electrophysiology Office Note   Date:  12/24/2018   ID:  [place], [place] June 07, 1965, MRN [id number]  PCP:  Charlott Rakes, MD Primary Electrophysiologist:  Constance Haw, MD    No chief complaint on file.    History of Present Illness: James Cortez is a 53 y.o. male who presents today for electrophysiology evaluation.   Hx CVA treated with TPA. Had TEE without abnormality and LINQ implant.Since then, he is felt well without complaint. He has had no chest pain, shortness of breath, PND, orthopnea, or palpitations.  Today, denies symptoms of palpitations, chest pain, shortness of breath, orthopnea, PND, lower extremity edema, claudication, dizziness, presyncope, syncope, bleeding, or neurologic sequela. The patient is tolerating medications without difficulties.  Overall he is doing well.  He has had no atrial fibrillation noted on Linq monitoring.  At this point, he wishes for his Linq monitor to be explanted.   Past Medical History:  Diagnosis Date  . Skin cancer    Past Surgical History:  Procedure Laterality Date  . EP IMPLANTABLE DEVICE N/A 12/15/2015   Procedure: Loop Recorder Insertion;  Surgeon: Xzaiver Vayda Meredith Leeds, MD;  Location: [place] [facility];  Service: Cardiovascular;  Laterality: N/A;  . IR GENERIC HISTORICAL  12/08/2015   IR PERCUTANEOUS ART THROMBECTOMY/INFUSION INTRACRANIAL INC DIAG ANGIO 12/08/2015 Luanne Bras, MD [facility]  . RADIOLOGY WITH ANESTHESIA Right 12/08/2015   Procedure: RADIOLOGY WITH ANESTHESIA - CODE STROKE;  Surgeon: Medication Radiologist, MD;  Location: [place];  Service: Radiology;  Laterality: Right;  . SKIN SURGERY    . TEE WITHOUT CARDIOVERSION N/A 12/15/2015   Procedure: TRANSESOPHAGEAL ECHOCARDIOGRAM (TEE);  Surgeon: Sueanne Margarita, MD;  Location: [facility] ENDOSCOPY;  Service: Cardiovascular;  Laterality: N/A;     Current Outpatient Medications  Medication Sig Dispense Refill  . aspirin EC 81  MG tablet Take 1 tablet (81 mg total) by mouth daily. 30 tablet 6  . atorvastatin (LIPITOR) 20 MG tablet Take 1 tablet (20 mg total) by mouth daily at 6 PM. 30 tablet 6   No current facility-administered medications for this visit.     Allergies:   Patient has no known allergies.   Social History:  The patient  reports that he has quit smoking. He smoked 0.00 packs per day. He has never used smokeless tobacco. He reports that he does not drink alcohol or use drugs.   Family History:  The patient's Family history is unknown by patient.    ROS:  Please see the history of present illness.   Otherwise, review of systems is positive for none.   All other systems are reviewed and negative.   PHYSICAL EXAM: VS:  BP 116/68   Pulse (!) 55   Ht 5\' 7"  (1.702 m)   Wt 174 lb 6.4 oz (79.1 kg)   SpO2 97%   BMI 27.31 kg/m  , BMI Body mass index is 27.31 kg/m. GEN: Well nourished, well developed, in no acute distress  HEENT: normal  Neck: no JVD, carotid bruits, or masses Cardiac: RRR; no murmurs, rubs, or gallops,no edema  Respiratory:  clear to auscultation bilaterally, normal work of breathing GI: soft, nontender, nondistended, + BS MS: no deformity or atrophy  Skin: warm and dry, device site well healed Neuro:  Strength and sensation are intact Psych: euthymic mood, full affect  EKG:  EKG is ordered today. Personal review of the ekg ordered shows SR, rate 55  Personal review of the device interrogation today.  Results in [place]: 07/05/2018: ALT 16; BUN 11; Creatinine, Ser 0.81; Hemoglobin 16.3; Platelets 311; Potassium 4.9; Sodium 141; TSH 3.640    Lipid Panel     Component Value Date/Time   CHOL 173 10/05/2017 0854   TRIG 124 10/05/2017 0854   HDL 38 (L) 10/05/2017 0854   CHOLHDL 4.6 10/05/2017 0854   CHOLHDL 6.6 12/09/2015 0430   VLDL 48 (H) 12/09/2015 0430   LDLCALC 110 (H) 10/05/2017 0854     Wt Readings from Last 3 Encounters:  12/24/18 174 lb 6.4 oz  (79.1 kg)  10/04/17 176 lb 9.6 oz (80.1 kg)  07/04/17 176 lb (79.8 kg)      Other studies Reviewed: Additional studies/ records that were reviewed today include: TTE 12/10/15  Review of the above records today demonstrates:  - Left ventricle: The cavity size was normal. Systolic function was   normal. The estimated ejection fraction was in the range of 55%   to 60%. Wall motion was normal; there were no regional wall   motion abnormalities. Left ventricular diastolic function   parameters were normal.   ASSESSMENT AND PLAN:  1.  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  Device is functioning appropriately.  Since it has been 3 years, the patient wishes to have the Linq monitor explanted.  Risks and benefits were discussed and include bleeding and infection.  The patient understands these risks and has agreed to the procedure.    Current medicines are reviewed at length with the patient today.   The patient does not have concerns regarding his medicines.  The following changes were made today: None  Labs/ tests ordered today include:  Orders Placed This Encounter  Procedures  . CUP PACEART [place]  . EKG 12-Lead     Disposition:   FU with Baillie Mohammad as needed  Signed, Zenith Lamphier Meredith Leeds, MD  12/24/2018 11:17 AM     [facility] [address] [place] [address] [phone] (office) [phone] (fax)

## 2018-12-24 NOTE — Patient Instructions (Signed)
Medication Instructions:  Your physician recommends that you continue on your current medications as directed. Please refer to the Current Medication list given to you today.  * If you need a refill on your cardiac medications before your next appointment, please call your pharmacy.   Labwork: None ordered  Testing/Procedures: None ordered  Follow-Up: The nurse will call you to arrange your loop recorder to be taken out.   Thank you for choosing [facility]!!   Trinidad Curet, RN [phone]

## 2018-12-31 ENCOUNTER — Ambulatory Visit (INDEPENDENT_AMBULATORY_CARE_PROVIDER_SITE_OTHER): Payer: Self-pay | Admitting: *Deleted

## 2018-12-31 DIAGNOSIS — I63312 Cerebral infarction due to thrombosis of left middle cerebral artery: Secondary | ICD-10-CM

## 2018-12-31 LAB — CUP PACEART REMOTE DEVICE CHECK
Date Time Interrogation Session: 20201122151427
Implantable Pulse Generator Implant Date: 20171107

## 2019-01-31 ENCOUNTER — Ambulatory Visit (INDEPENDENT_AMBULATORY_CARE_PROVIDER_SITE_OTHER): Payer: Self-pay | Admitting: *Deleted

## 2019-01-31 DIAGNOSIS — I63312 Cerebral infarction due to thrombosis of left middle cerebral artery: Secondary | ICD-10-CM

## 2019-01-31 LAB — CUP PACEART REMOTE DEVICE CHECK
Date Time Interrogation Session: 20201223230801
Implantable Pulse Generator Implant Date: 20171107

## 2019-02-02 NOTE — Progress Notes (Signed)
ILR remote 

## 2019-02-07 ENCOUNTER — Telehealth: Payer: Self-pay | Admitting: *Deleted

## 2019-02-07 NOTE — Telephone Encounter (Signed)
lmtcb  (pt wants ILR removed.  Pt is self-pay?)

## 2019-02-11 NOTE — Telephone Encounter (Signed)
Follow Up  Patient's daughter is returning call. Please give a call back.

## 2019-02-11 NOTE — Telephone Encounter (Signed)
Returned call to dtr (ok to speak w/ her per pt). She reports that he has an orange card but she isn't sure if it is active.  She will check on this. She also asks what the out of pocket cost would be.  Informed that I would check with our billing dept. We agreed to speak again by next week

## 2019-02-27 NOTE — Telephone Encounter (Signed)
lmtcb

## 2019-03-04 ENCOUNTER — Ambulatory Visit (INDEPENDENT_AMBULATORY_CARE_PROVIDER_SITE_OTHER): Payer: Self-pay | Admitting: *Deleted

## 2019-03-04 DIAGNOSIS — I63412 Cerebral infarction due to embolism of left middle cerebral artery: Secondary | ICD-10-CM

## 2019-03-04 LAB — CUP PACEART REMOTE DEVICE CHECK
Date Time Interrogation Session: 20210124231612
Implantable Pulse Generator Implant Date: 20171107

## 2019-03-08 ENCOUNTER — Other Ambulatory Visit: Payer: Self-pay

## 2019-03-08 ENCOUNTER — Ambulatory Visit: Payer: Self-pay

## 2019-04-04 ENCOUNTER — Ambulatory Visit (INDEPENDENT_AMBULATORY_CARE_PROVIDER_SITE_OTHER): Payer: Self-pay | Admitting: *Deleted

## 2019-04-04 DIAGNOSIS — I63412 Cerebral infarction due to embolism of left middle cerebral artery: Secondary | ICD-10-CM

## 2019-04-04 LAB — CUP PACEART REMOTE DEVICE CHECK
Date Time Interrogation Session: 20210224235557
Implantable Pulse Generator Implant Date: 20171107

## 2019-04-04 NOTE — Progress Notes (Signed)
ILR Remote 

## 2019-04-19 ENCOUNTER — Emergency Department (HOSPITAL_COMMUNITY)
Admission: EM | Admit: 2019-04-19 | Discharge: 2019-04-19 | Disposition: A | Discharge status: Home / Self Care | Payer: Self-pay | Attending: Emergency Medicine | Admitting: Emergency Medicine

## 2019-04-19 ENCOUNTER — Encounter (HOSPITAL_COMMUNITY): Payer: Self-pay | Admitting: Pediatrics

## 2019-04-19 ENCOUNTER — Other Ambulatory Visit: Payer: Self-pay

## 2019-04-19 DIAGNOSIS — S00251A Superficial foreign body of right eyelid and periocular area, initial encounter: Secondary | ICD-10-CM | POA: Insufficient documentation

## 2019-04-19 DIAGNOSIS — Y93H3 Activity, building and construction: Secondary | ICD-10-CM | POA: Insufficient documentation

## 2019-04-19 DIAGNOSIS — Y929 Unspecified place or not applicable: Secondary | ICD-10-CM | POA: Insufficient documentation

## 2019-04-19 DIAGNOSIS — S0501XA Injury of conjunctiva and corneal abrasion without foreign body, right eye, initial encounter: Secondary | ICD-10-CM | POA: Insufficient documentation

## 2019-04-19 DIAGNOSIS — Y999 Unspecified external cause status: Secondary | ICD-10-CM | POA: Insufficient documentation

## 2019-04-19 DIAGNOSIS — X58XXXA Exposure to other specified factors, initial encounter: Secondary | ICD-10-CM | POA: Insufficient documentation

## 2019-04-19 HISTORY — DX: Cerebral infarction, unspecified: I63.9

## 2019-04-19 MED ORDER — TETRACAINE HCL 0.5 % OP SOLN
2.0000 [drp] | Freq: Once | OPHTHALMIC | Status: DC
  Filled 2019-04-19: qty 4

## 2019-04-19 MED ORDER — TOBRAMYCIN 0.3 % OP SOLN
2.0000 [drp] | Freq: Once | OPHTHALMIC | Status: DC
  Filled 2019-04-19: qty 5

## 2019-04-19 MED ORDER — FLUORESCEIN SODIUM 1 MG OP STRP
1.0000 | ORAL_STRIP | Freq: Once | OPHTHALMIC | Status: DC
  Filled 2019-04-19: qty 1

## 2019-04-19 NOTE — ED Provider Notes (Signed)
[place] EMERGENCY DEPARTMENT Provider Note   CSN: [id number] Arrival date & time: 04/19/19  1339     History Chief Complaint  Patient presents with  . Eye Problem    [place] is a 54 y.o. male.  54 year old male presents with complaint of foreign body sensation in his right eye.  Patient was installing flooring and feels like he got something in his eye today.  Patient reports pain in the eye, light sensitivity, tearing.  Does not wear glasses or contacts, no other complaints or concerns today.        Past Medical History:  Diagnosis Date  . Skin cancer   . Stroke [facility])     Patient Active Problem List   Diagnosis Date Noted  . Genital herpes 10/04/2017  . Right hand paresthesia 07/04/2017  . History of herpes genitalis 07/04/2017  . Status post placement of implantable loop recorder 04/04/2017  . Chronic right shoulder pain 04/04/2017  . Dupuytren's contracture of right hand 02/18/2016  . Gait disturbance, post-stroke 01/04/2016  . Hemiparesis affecting right side as late effect of cerebrovascular accident (CVA) ([place]) 01/04/2016  . Cerebral infarction due to thrombosis of left middle cerebral artery ([place]) 12/15/2015  . Left middle cerebral artery stroke ([place]) 12/15/2015  . Oropharyngeal dysphagia   . Aphasia as late effect of stroke   . Right hemiplegia ([place])   . Essential hypertension   . CVA (cerebral vascular accident) ([place]) 12/08/2015    Past Surgical History:  Procedure Laterality Date  . EP IMPLANTABLE DEVICE N/A 12/15/2015   Procedure: Loop Recorder Insertion;  Surgeon: Will Meredith Leeds, MD;  Location: [place] CV LAB;  Service: Cardiovascular;  Laterality: N/A;  . IR GENERIC HISTORICAL  12/08/2015   IR PERCUTANEOUS ART THROMBECTOMY/INFUSION INTRACRANIAL INC DIAG ANGIO 12/08/2015 Luanne Bras, MD [facility]-INTERV RAD  . RADIOLOGY WITH ANESTHESIA Right 12/08/2015   Procedure: RADIOLOGY WITH ANESTHESIA - CODE  STROKE;  Surgeon: Medication Radiologist, MD;  Location: [place];  Service: Radiology;  Laterality: Right;  . SKIN SURGERY    . TEE WITHOUT CARDIOVERSION N/A 12/15/2015   Procedure: TRANSESOPHAGEAL ECHOCARDIOGRAM (TEE);  Surgeon: Sueanne Margarita, MD;  Location: [facility] ENDOSCOPY;  Service: Cardiovascular;  Laterality: N/A;       Family History  Family history unknown: Yes    Social History   Tobacco Use  . Smoking status: Former Smoker    Packs/day: 0.00  . Smokeless tobacco: Never Used  . Tobacco comment: quit 5 months ago-"isn't even having one"  Substance Use Topics  . Alcohol use: No  . Drug use: No    Home Medications Prior to Admission medications   Medication Sig Start Date End Date Taking? Authorizing Provider  aspirin EC 81 MG tablet Take 1 tablet (81 mg total) by mouth daily. 07/05/18   Charlott Rakes, MD  atorvastatin (LIPITOR) 20 MG tablet Take 1 tablet (20 mg total) by mouth daily at 6 PM. 12/24/18   Camnitz, Ocie Doyne, MD    Allergies    Patient has no known allergies.  Review of Systems   Review of Systems  Constitutional: Negative for fever.  Eyes: Positive for photophobia, pain, discharge, redness and visual disturbance.  Skin: Negative for rash and wound.    Physical Exam Updated Vital Signs BP (!) 151/61 (BP Location: Left Arm)   Pulse 70   Temp 97.7 F (36.5 C) (Oral)   Resp 18   Ht 5\' 8"  (1.727 m)   Wt 75.8  kg   SpO2 97%   BMI 25.39 kg/m   Physical Exam Vitals and nursing note reviewed.  Constitutional:      General: He is not in acute distress.    Appearance: He is well-developed. He is not diaphoretic.  HENT:     Head: Normocephalic and atraumatic.  Eyes:     General:        Right eye: Foreign body present.     Extraocular Movements: Extraocular movements intact.     Conjunctiva/sclera:     Right eye: Right conjunctiva is injected. No hemorrhage.    Pupils:     Right eye: Fluorescein uptake present. Seidel exam negative.    Pulmonary:     Effort: Pulmonary effort is normal.  Skin:    General: Skin is warm and dry.     Findings: No erythema.  Neurological:     Mental Status: He is alert and oriented to person, place, and time.  Psychiatric:        Behavior: Behavior normal.     ED Results / Procedures / Treatments   Labs (all labs ordered are listed, but only abnormal results are displayed) Labs Reviewed - No data to display  EKG None  Radiology No results found.  Procedures .Foreign Body Removal  Date/Time: 04/19/2019 4:02 PM Performed by: Tacy Learn, PA-C Authorized by: Tacy Learn, PA-C  Consent: Verbal consent obtained. Written consent not obtained. Risks and benefits: risks, benefits and alternatives were discussed Consent given by: patient Patient understanding: patient states understanding of the procedure being performed Patient consent: the patient's understanding of the procedure matches consent given Procedure consent: procedure consent matches procedure scheduled Relevant documents: relevant documents present and verified Test results: test results available and properly labeled Patient identity confirmed: verbally with patient Time out: Immediately prior to procedure a "time out" was called to verify the correct patient, procedure, equipment, support staff and site/side marked as required. Body area: eye Location details: right eyelid  Anesthesia: Local Anesthetic: tetracaine drops  Sedation: Patient sedated: no  Patient restrained: no Patient cooperative: yes Localization method: eyelid eversion and visualized Removal mechanism: moist cotton swab Eye examined with fluorescein. Fluorescein uptake. Corneal abrasion size: medium Corneal abrasion location: superior Dressing: antibiotic drops Depth: superficial 1 objects recovered. Objects recovered: approx 62mm non metalic material  Post-procedure assessment: foreign body removed Patient tolerance: patient  tolerated the procedure well with no immediate complications   (including critical care time)  Medications Ordered in ED Medications  fluorescein ophthalmic strip 1 strip (has no administration in time range)  tetracaine (PONTOCAINE) 0.5 % ophthalmic solution 2 drop (has no administration in time range)  tobramycin (TOBREX) 0.3 % ophthalmic solution 2 drop (has no administration in time range)    ED Course  I have reviewed the triage vital signs and the nursing notes.  Pertinent labs & imaging results that were available during my care of the patient were reviewed by me and considered in my medical decision making (see chart for details).  Clinical Course as of Apr 18 1604  Fri Apr 18, 2625  5942 55 year old Spanish-speaking male presents with complaint of foreign body sensation in his right eye.  Patient states that he was working in a house and got debris in his eye.  Patient applied drops to his eye however eye continues to hurt.  Patient does not wear glasses or contacts.  On exam with fluorescein, patient has a corneal abrasion to the upper portion of his right cornea,  no streaming.  Right upper eyelid was everted and shows small foreign body which was easily removed with a cotton tip applicator.  Pain has resolved with tetracaine drops.  Last tetanus was in 2019.  Patient will be given tobramycin drops prior to discharge, advised to continue using the drops every 4 hours while awake.  Patient referred to ophthalmology for follow-up on Monday.   [LM]    Clinical Course User Index [LM] Roque Lias   MDM Rules/Calculators/A&P                      Final Clinical Impression(s) / ED Diagnoses Final diagnoses:  Abrasion of right cornea, initial encounter  Foreign body of right eyelid    Rx / DC Orders ED Discharge Orders    None       Tacy Learn, PA-C 04/19/19 1606    Tegeler, Gwenyth Allegra, MD 04/19/19 [phone]

## 2019-04-19 NOTE — Discharge Instructions (Addendum)
Apply 2 eyedrops to your eye every 4 hours while awake. Follow-up with the eye doctor, call to schedule appointment for Monday for recheck.  Return to ER through the weekend for any worsening or concerning symptoms.

## 2019-04-19 NOTE — ED Triage Notes (Signed)
Patient triaged via [employer] interpreter. Reported he was working in his house today and something got on his right eye; stated he feels it inside his eye when he blinks.

## 2019-05-05 LAB — CUP PACEART REMOTE DEVICE CHECK
Date Time Interrogation Session: 20210328022536
Implantable Pulse Generator Implant Date: 20171107

## 2019-05-06 ENCOUNTER — Ambulatory Visit (INDEPENDENT_AMBULATORY_CARE_PROVIDER_SITE_OTHER): Payer: Self-pay | Admitting: *Deleted

## 2019-05-06 DIAGNOSIS — I63412 Cerebral infarction due to embolism of left middle cerebral artery: Secondary | ICD-10-CM

## 2019-05-06 NOTE — Progress Notes (Signed)
ILR Remote 

## 2019-05-30 ENCOUNTER — Ambulatory Visit: Payer: Self-pay

## 2019-06-05 ENCOUNTER — Other Ambulatory Visit: Payer: Self-pay

## 2019-06-06 ENCOUNTER — Ambulatory Visit (INDEPENDENT_AMBULATORY_CARE_PROVIDER_SITE_OTHER): Payer: Self-pay | Admitting: *Deleted

## 2019-06-06 DIAGNOSIS — I63412 Cerebral infarction due to embolism of left middle cerebral artery: Secondary | ICD-10-CM

## 2019-06-06 LAB — CUP PACEART REMOTE DEVICE CHECK
Date Time Interrogation Session: 20210429001616
Implantable Pulse Generator Implant Date: 20171107

## 2019-06-07 NOTE — Progress Notes (Signed)
ILR Remote 

## 2019-06-12 ENCOUNTER — Telehealth: Payer: Self-pay

## 2019-06-12 NOTE — Telephone Encounter (Signed)
Spoke with patient to inform of disconnected monitor. °

## 2019-07-11 ENCOUNTER — Ambulatory Visit (INDEPENDENT_AMBULATORY_CARE_PROVIDER_SITE_OTHER): Payer: Self-pay | Admitting: *Deleted

## 2019-07-11 DIAGNOSIS — I63412 Cerebral infarction due to embolism of left middle cerebral artery: Secondary | ICD-10-CM

## 2019-07-11 LAB — CUP PACEART REMOTE DEVICE CHECK
Date Time Interrogation Session: 20210602232041
Implantable Pulse Generator Implant Date: 20171107

## 2019-07-16 NOTE — Progress Notes (Signed)
Carelink Summary Report / Loop Recorder 

## 2019-08-13 ENCOUNTER — Ambulatory Visit (INDEPENDENT_AMBULATORY_CARE_PROVIDER_SITE_OTHER): Payer: Self-pay | Admitting: *Deleted

## 2019-08-13 DIAGNOSIS — I63412 Cerebral infarction due to embolism of left middle cerebral artery: Secondary | ICD-10-CM

## 2019-08-13 LAB — CUP PACEART REMOTE DEVICE CHECK
Date Time Interrogation Session: 20210706023642
Implantable Pulse Generator Implant Date: 20171107

## 2019-08-14 NOTE — Progress Notes (Signed)
Carelink Summary Report / Loop Recorder 

## 2019-09-16 ENCOUNTER — Ambulatory Visit (INDEPENDENT_AMBULATORY_CARE_PROVIDER_SITE_OTHER): Payer: Self-pay | Admitting: *Deleted

## 2019-09-16 DIAGNOSIS — I63412 Cerebral infarction due to embolism of left middle cerebral artery: Secondary | ICD-10-CM

## 2019-09-16 LAB — CUP PACEART REMOTE DEVICE CHECK
Date Time Interrogation Session: 20210808023630
Implantable Pulse Generator Implant Date: 20171107

## 2022-07-18 ENCOUNTER — Emergency Department (HOSPITAL_COMMUNITY)
Admission: EM | Admit: 2022-07-18 | Discharge: 2022-07-18 | Disposition: A | Discharge status: Home / Self Care | Payer: Self-pay | Attending: Emergency Medicine | Admitting: Emergency Medicine

## 2022-07-18 ENCOUNTER — Emergency Department (HOSPITAL_COMMUNITY): Payer: Self-pay

## 2022-07-18 ENCOUNTER — Other Ambulatory Visit: Payer: Self-pay

## 2022-07-18 ENCOUNTER — Encounter (HOSPITAL_COMMUNITY): Payer: Self-pay

## 2022-07-18 DIAGNOSIS — Z7982 Long term (current) use of aspirin: Secondary | ICD-10-CM | POA: Insufficient documentation

## 2022-07-18 DIAGNOSIS — Z79899 Other long term (current) drug therapy: Secondary | ICD-10-CM | POA: Insufficient documentation

## 2022-07-18 DIAGNOSIS — I10 Essential (primary) hypertension: Secondary | ICD-10-CM | POA: Insufficient documentation

## 2022-07-18 DIAGNOSIS — L03211 Cellulitis of face: Secondary | ICD-10-CM | POA: Insufficient documentation

## 2022-07-18 DIAGNOSIS — I6601 Occlusion and stenosis of right middle cerebral artery: Secondary | ICD-10-CM | POA: Insufficient documentation

## 2022-07-18 DIAGNOSIS — Z8673 Personal history of transient ischemic attack (TIA), and cerebral infarction without residual deficits: Secondary | ICD-10-CM | POA: Insufficient documentation

## 2022-07-18 LAB — CBC WITH DIFFERENTIAL/PLATELET
Abs Immature Granulocytes: 0.08 10*3/uL — ABNORMAL HIGH (ref 0.00–0.07)
Basophils Absolute: 0.1 10*3/uL (ref 0.0–0.1)
Basophils Relative: 1 %
Eosinophils Absolute: 0.1 10*3/uL (ref 0.0–0.5)
Eosinophils Relative: 1 %
HCT: 44.3 % (ref 39.0–52.0)
Hemoglobin: 14.5 g/dL (ref 13.0–17.0)
Immature Granulocytes: 1 %
Lymphocytes Relative: 28 %
Lymphs Abs: 2.9 10*3/uL (ref 0.7–4.0)
MCH: 29.3 pg (ref 26.0–34.0)
MCHC: 32.7 g/dL (ref 30.0–36.0)
MCV: 89.5 fL (ref 80.0–100.0)
Monocytes Absolute: 0.9 10*3/uL (ref 0.1–1.0)
Monocytes Relative: 8 %
Neutro Abs: 6.3 10*3/uL (ref 1.7–7.7)
Neutrophils Relative %: 61 %
Platelets: 313 10*3/uL (ref 150–400)
RBC: 4.95 MIL/uL (ref 4.22–5.81)
RDW: 12.7 % (ref 11.5–15.5)
WBC: 10.3 10*3/uL (ref 4.0–10.5)
nRBC: 0 % (ref 0.0–0.2)

## 2022-07-18 LAB — BASIC METABOLIC PANEL
Anion gap: 8 (ref 5–15)
BUN: 15 mg/dL (ref 6–20)
CO2: 22 mmol/L (ref 22–32)
Calcium: 8.6 mg/dL — ABNORMAL LOW (ref 8.9–10.3)
Chloride: 107 mmol/L (ref 98–111)
Creatinine, Ser: 0.67 mg/dL (ref 0.61–1.24)
GFR, Estimated: >60 mL/min (ref >60)
Glucose, Bld: 100 mg/dL — ABNORMAL HIGH (ref 70–99)
Potassium: 3.6 mmol/L (ref 3.5–5.1)
Sodium: 137 mmol/L (ref 135–145)

## 2022-07-18 MED ORDER — IOHEXOL 300 MG/ML  SOLN
100.0000 mL | Freq: Once | INTRAMUSCULAR | Status: AC | PRN
  Administered 2022-07-18: 100 mL via INTRAVENOUS

## 2022-07-18 MED ORDER — ACETAMINOPHEN 325 MG PO TABS
650.0000 mg | ORAL_TABLET | Freq: Once | ORAL | Status: AC
  Administered 2022-07-18: 650 mg via ORAL
  Filled 2022-07-18: qty 2

## 2022-07-18 MED ORDER — CEPHALEXIN 500 MG PO CAPS
500.0000 mg | ORAL_CAPSULE | Freq: Four times a day (QID) | ORAL | 0 refills | Status: AC
  Filled 2022-07-18: qty 20, 5d supply, fill #0

## 2022-07-18 NOTE — Discharge Instructions (Signed)
Por favor haga un seguimiento con el neurlogo que le adjunto.  Hoy su tomografa computarizada mostr que una de las arterias de su cerebro est severamente ocluida y que ser necesario reevaluarla con ellas.  Tambin le recet Keflex para que tome para su hinchazn facial y un dentista para que le haga un seguimiento.  Puede tomar Tylenol cada 6 horas necesarias para el dolor.  Si los sntomas [place] o Comfort, regrese a Sports administrator.  Esta traduccin se realiz con [employer] Translate y se hicieron todos los intentos para Licensed conveyancer.

## 2022-07-18 NOTE — ED Triage Notes (Addendum)
Pt c/o R side facial swelling x5 days.  Pain score 4/10.  Pt reports cavities in R upper teeth.

## 2022-07-18 NOTE — ED Provider Notes (Signed)
[place] EMERGENCY DEPARTMENT AT [facility] Provider Note   CSN: [id number] Arrival date & time: 07/18/22  7829     History  Chief Complaint  Patient presents with   Facial Swelling    James [place] is a 57 y.o. male history of CVA, right hemiplegia, left MCA, hypertension presented with 5 days of right facial swelling.  Patient states he has cavities in the area does not see a dentist but began noticing swelling in the right side of his face 5 days ago.  Patient states that he is felt warm but does not endorse any fevers.  Patient states able to eat and drink and has been using Advil every 4 hours which seems to help.  Patient denies any chest pain, shortness of breath, neck swelling, ear pain, vision changes, hearing changes, new onset weakness or changes sensation/motor skills  Home Medications Prior to Admission medications   Medication Sig Start Date End Date Taking? Authorizing Provider  cephALEXin (KEFLEX) 500 MG capsule Take 1 capsule (500 mg total) by mouth 4 (four) times daily. 07/18/22  Yes Netta Corrigan, PA-C  aspirin EC 81 MG tablet Take 1 tablet (81 mg total) by mouth daily. 07/05/18   Hoy Register, MD  atorvastatin (LIPITOR) 20 MG tablet Take 1 tablet (20 mg total) by mouth daily at 6 PM. 12/24/18   Camnitz, Andree Coss, MD      Allergies    Patient has no known allergies.    Review of Systems   Review of Systems See HPI Physical Exam Updated Vital Signs BP (!) 162/90 (BP Location: Left Arm)   Pulse 62   Temp 98.2 F (36.8 C) (Oral)   Resp 18   SpO2 99%  Physical Exam Constitutional:      General: He is not in acute distress. HENT:     Head:     Comments: Right side of the face does appear swollen and is mildly tender to palpation however no abnormalities palpated    Right Ear: Tympanic membrane, ear canal and external ear normal.     Left Ear: Tympanic membrane and external ear normal.     Ears:     Comments: Mild  swelling noted in right ear canal    Nose: Nose normal.     Mouth/Throat:     Mouth: Mucous membranes are moist.     Comments: Cavity is noted in the upper right teeth No signs of gingival edema, mucosal skin color changes, exposed bone, oral floor elevation, discharge Tolerating secretions Eyes:     Extraocular Movements: Extraocular movements intact.     Conjunctiva/sclera: Conjunctivae normal.     Pupils: Pupils are equal, round, and reactive to light.  Cardiovascular:     Rate and Rhythm: Normal rate and regular rhythm.     Pulses: Normal pulses.     Heart sounds: Normal heart sounds.  Pulmonary:     Effort: Pulmonary effort is normal. No respiratory distress.     Breath sounds: Normal breath sounds.  Musculoskeletal:     Cervical back: Normal range of motion and neck supple. No rigidity or tenderness.  Skin:    General: Skin is warm and dry.     Comments: No overlying skin color changes  Neurological:     Mental Status: He is alert.     Comments: Cranial nerves III through XII intact Vision grossly intact Sensation intact in all 4 limbs     ED Results / Procedures /  Treatments   Labs (all labs ordered are listed, but only abnormal results are displayed) Labs Reviewed  BASIC METABOLIC PANEL - Abnormal; Notable for the following components:      Result Value   Glucose, Bld 100 (*)    Calcium 8.6 (*)    All other components within normal limits  CBC WITH DIFFERENTIAL/PLATELET - Abnormal; Notable for the following components:   Abs Immature Granulocytes 0.08 (*)    All other components within normal limits    EKG None  Radiology CT Maxillofacial W Contrast  Result Date: 07/18/2022 CLINICAL DATA:  right sided facial swelling with cavities in that area EXAM: CT MAXILLOFACIAL WITH CONTRAST TECHNIQUE: Multidetector CT imaging of the maxillofacial structures was performed with intravenous contrast. Multiplanar CT image reconstructions were also generated. RADIATION DOSE  REDUCTION: This exam was performed according to the departmental dose-optimization program which includes automated exposure control, adjustment of the mA and/or kV according to patient size and/or use of iterative reconstruction technique. CONTRAST:  OMNIPAQUE IOHEXOL 300 MG/ML  SOLN COMPARISON:  CT head 12/09/2015 FINDINGS: Osseous: No fracture or mandibular dislocation. Orbits: Negative. No traumatic or inflammatory finding. Sinuses: No middle ear or mastoid effusion. Paranasal sinuses are grossly clear. There is trace mucosal thickening of the floor of the right maxillary sinus. Orbits are unremarkable. Soft tissues: There is mild asymmetric subcutaneous soft tissue stranding along the perimandibular soft tissues on the right (series 3, image 23). There is no evidence of an odontogenic soft tissue abscess. There are multiple dental caries and periapical lucencies along the bilateral mandibular and maxillary arches Limited intracranial: Chronic left basal ganglia infarcts. Unchanged calcification in the left caudate head. Left M1 stent in place. Right MCA is either occluded or severely stenosed, new compared to 2017. IMPRESSION: 1. Mild asymmetric subcutaneous soft tissue stranding along the perimandibular soft tissues on the right, which may represent cellulitis. No evidence of an odontogenic soft tissue abscess. 2. Multiple dental caries and periapical lucencies along the bilateral mandibular and maxillary arches. 3. 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 Electronically Signed   By: Lorenza Cambridge M.D.   On: 07/18/2022 10:56    Procedures Procedures    Medications Ordered in ED Medications  acetaminophen (TYLENOL) tablet 650 mg (650 mg Oral Given 07/18/22 0945)  iohexol (OMNIPAQUE) 300 MG/ML solution 100 mL (100 mLs Intravenous Contrast Given 07/18/22 1019)    ED Course/ Medical Decision Making/ A&P                             Medical Decision Making  [name] 57 y.o. presented today for right-sided facial swelling. Working DDx that I considered at this time includes, but not limited to, cellulitis, edema secondary to cavities, abscess, gingivitis, Ludwig's angina, CVA, anaphylaxis, airway compromise, osteomyelitis.  R/o DDx: edema secondary to cavities, abscess, gingivitis, Ludwig's angina, CVA, anaphylaxis, airway compromise, osteomyelitis: These are considered less likely due to history of present illness and physical exam findings  Review of prior external notes: 04/19/2019 ED  Unique Tests and My Interpretation:  BMP: Unremarkable CBC: Unremarkable CT maxillofacial with contrast:  Discussion with Independent Historian:  Son  Discussion of Management of Tests:  Palikh, MD Neurology  Risk: Medium: prescription drug management  Risk Stratification Score: none  Staffed with Tegeler, MD  Plan: Patient presented for right facial swelling. On exam patient was in no acute distress and stable  vitals.  Patient's exam did show right facial swelling with cavities noted in the area inside his mouth.  His mouth exam was otherwise unremarkable.  Patient did have mild swelling in his right ear canal however no other abnormalities were noted on the ear exam.  Patient is tolerating secretions and able to eat and drink.  Patient was mildly tender when I palpated the right side of his face however I did not palpate any areas of fluctuance or other abnormalities.  Basic labs to be drawn along with a CT maxillofacial with contrast to evaluate for possible abscess.  I highly suspect edema secondary to cavities and patient would most likely be discharged with dental follow-up.  Patient given Tylenol as he had Advil earlier this morning for pain management and is stable at this time.  Patient CT shows that patient most likely has facial cellulitis as opposed to an abscess and will be given Keflex with dental follow-up.  CT also showed the  patient has severe stenosis of his right MCA and while patient had negative neuroexam and is not showing any signs of neurodeficits neurology was consulted and stated patient may follow-up outpatient.  This plan was relayed to the patient and all of his questions were answered to his satisfaction.  Patient was given return precautions. Patient stable for discharge at this time.  Patient verbalized understanding of plan.         Final Clinical Impression(s) / ED Diagnoses Final diagnoses:  Facial cellulitis  Stenosis of right middle cerebral artery    Rx / DC Orders ED Discharge Orders          Ordered    cephALEXin (KEFLEX) 500 MG capsule  4 times daily        07/18/22 1133              Netta Corrigan, [place] 07/18/22 1139    Tegeler, Canary Brim, MD 07/18/22 1154

## 2022-07-22 ENCOUNTER — Inpatient Hospital Stay: Payer: Self-pay | Admitting: Nurse Practitioner
